# Patient Record
Sex: Female | Born: 1955 | Race: White | Hispanic: No | Marital: Single | Smoking: Never smoker
Health system: Southern US, Community
[De-identification: ages and names within clinical notes are randomized; demographics above are authoritative.]

## PROBLEM LIST (undated history)

## (undated) DIAGNOSIS — I4891 Unspecified atrial fibrillation: Secondary | ICD-10-CM

## (undated) DIAGNOSIS — T7840XA Allergy, unspecified, initial encounter: Secondary | ICD-10-CM

## (undated) DIAGNOSIS — G4730 Sleep apnea, unspecified: Secondary | ICD-10-CM

## (undated) DIAGNOSIS — G4733 Obstructive sleep apnea (adult) (pediatric): Secondary | ICD-10-CM

## (undated) DIAGNOSIS — Z800 Family history of malignant neoplasm of digestive organs: Secondary | ICD-10-CM

## (undated) DIAGNOSIS — C50919 Malignant neoplasm of unspecified site of unspecified female breast: Secondary | ICD-10-CM

## (undated) DIAGNOSIS — R011 Cardiac murmur, unspecified: Secondary | ICD-10-CM

## (undated) DIAGNOSIS — D649 Anemia, unspecified: Secondary | ICD-10-CM

## (undated) DIAGNOSIS — E785 Hyperlipidemia, unspecified: Secondary | ICD-10-CM

## (undated) DIAGNOSIS — IMO0002 Reserved for concepts with insufficient information to code with codable children: Secondary | ICD-10-CM

## (undated) DIAGNOSIS — I10 Essential (primary) hypertension: Secondary | ICD-10-CM

## (undated) DIAGNOSIS — Z803 Family history of malignant neoplasm of breast: Secondary | ICD-10-CM

## (undated) DIAGNOSIS — F32A Depression, unspecified: Secondary | ICD-10-CM

## (undated) DIAGNOSIS — E669 Obesity, unspecified: Secondary | ICD-10-CM

## (undated) DIAGNOSIS — E039 Hypothyroidism, unspecified: Secondary | ICD-10-CM

## (undated) HISTORY — PX: COLONOSCOPY: SHX174

## (undated) HISTORY — DX: Obstructive sleep apnea (adult) (pediatric): G47.33

## (undated) HISTORY — PX: VAGINAL HYSTERECTOMY: SUR661

## (undated) HISTORY — DX: Anemia, unspecified: D64.9

## (undated) HISTORY — DX: Sleep apnea, unspecified: G47.30

## (undated) HISTORY — DX: Depression, unspecified: F32.A

## (undated) HISTORY — DX: Allergy, unspecified, initial encounter: T78.40XA

## (undated) HISTORY — DX: Malignant neoplasm of unspecified site of unspecified female breast: C50.919

## (undated) HISTORY — DX: Family history of malignant neoplasm of digestive organs: Z80.0

## (undated) HISTORY — DX: Family history of malignant neoplasm of breast: Z80.3

## (undated) HISTORY — PX: APPENDECTOMY: SHX54

## (undated) HISTORY — DX: Reserved for concepts with insufficient information to code with codable children: IMO0002

## (undated) HISTORY — DX: Cardiac murmur, unspecified: R01.1

## (undated) HISTORY — DX: Essential (primary) hypertension: I10

## (undated) HISTORY — PX: HERNIA REPAIR: SHX51

## (undated) HISTORY — PX: CHOLECYSTECTOMY: SHX55

## (undated) HISTORY — PX: ESOPHAGOGASTRODUODENOSCOPY: SHX1529

---

## 1990-05-23 HISTORY — PX: ABDOMINAL HYSTERECTOMY: SHX81

## 2003-05-24 HISTORY — PX: NECK SURGERY: SHX720

## 2004-05-23 DIAGNOSIS — E039 Hypothyroidism, unspecified: Secondary | ICD-10-CM

## 2004-05-23 HISTORY — PX: THYROIDECTOMY: SHX17

## 2004-05-23 HISTORY — DX: Hypothyroidism, unspecified: E03.9

## 2008-05-23 DIAGNOSIS — M797 Fibromyalgia: Secondary | ICD-10-CM

## 2008-05-23 HISTORY — DX: Fibromyalgia: M79.7

## 2009-03-10 ENCOUNTER — Ambulatory Visit: Payer: Self-pay | Admitting: Occupational Medicine

## 2009-03-10 DIAGNOSIS — J40 Bronchitis, not specified as acute or chronic: Secondary | ICD-10-CM | POA: Insufficient documentation

## 2009-03-10 HISTORY — DX: Bronchitis, not specified as acute or chronic: J40

## 2009-05-23 HISTORY — PX: GASTRIC BYPASS: SHX52

## 2009-06-18 ENCOUNTER — Ambulatory Visit: Payer: Self-pay | Admitting: Family Medicine

## 2009-06-18 DIAGNOSIS — R112 Nausea with vomiting, unspecified: Secondary | ICD-10-CM | POA: Insufficient documentation

## 2009-06-18 DIAGNOSIS — E860 Dehydration: Secondary | ICD-10-CM

## 2009-06-18 HISTORY — DX: Dehydration: E86.0

## 2010-06-22 NOTE — Letter (Signed)
Summary: Physicians Certificate of Transfer  Physicians Certificate of Transfer   Imported By: Junius Finner 06/18/2009 15:17:49  _____________________________________________________________________  External Attachment:    Type:   Image     Comment:   External Document

## 2010-06-22 NOTE — Assessment & Plan Note (Signed)
Summary: NAUSEA & VOMITING/KH   Vital Signs:  Patient Profile:   55 Years Old Female CC:      Nausea and vomiting Height:     67 inches Weight:      305 pounds O2 Sat:      98 % O2 treatment:    Room Air Temp:     97.0 degrees F oral Pulse rate:   119 / minute Resp:     22 per minute BP sitting:   125 / 93  (right arm)  Pt. in pain?   no  Vitals Entered By: Lita Mains, RN                   Updated Prior Medication List: SAVELLA 12.5 MG TABS (MILNACIPRAN HCL)  LEVOXYL 150 MCG TABS (LEVOTHYROXINE SODIUM)  ESTROPIPATE 1.5 MG TABS (ESTROPIPATE)   Current Allergies: No known allergies History of Present Illness History from: patient Chief Complaint: Nausea and vomiting History of Present Illness: Patient c/o nausea, vomting diarrhea for one week that began as sudden onset associated with palpatations, some abdominal pressure and dizziness. Reports this morning after showering she passed out and fell onto bed, no known injury.  Pt recently had bypass surgery  at [facility] on Dec 29th. SHe has lost 30 lbs in the past month. Unable to keep down any food or liquid. SHe has been taking a medication for the nausea and does not remember the name, she is also taking oxycodone for stomach pains around the incision. Both medications were given to her post-op. Also complaing of hot and cold flashes.   REVIEW OF SYSTEMS Constitutional Symptoms       Complains of chills and fatigue.     Denies fever, night sweats, weight loss, and weight gain.  Eyes       Denies change in vision, eye pain, eye discharge, glasses, contact lenses, and eye surgery. Ear/Nose/Throat/Mouth       Denies hearing loss/aids, change in hearing, ear pain, ear discharge, dizziness, frequent runny nose, frequent nose bleeds, sinus problems, sore throat, hoarseness, and tooth pain or bleeding.  Respiratory       Denies dry cough, productive cough, wheezing, shortness of breath, asthma, bronchitis, and  emphysema/COPD.  Cardiovascular       Denies murmurs, chest pain, and tires easily with exhertion.    Gastrointestinal       Complains of stomach pain and nausea/vomiting.      Denies diarrhea, constipation, blood in bowel movements, and indigestion. Genitourniary       Denies painful urination, kidney stones, and loss of urinary control. Neurological       Denies paralysis and seizures. Musculoskeletal       Denies muscle pain, joint pain, joint stiffness, decreased range of motion, redness, swelling, muscle weakness, and gout.  Skin       Denies bruising, unusual mles/lumps or sores, and hair/skin or nail changes.  Psych       Denies mood changes, temper/anger issues, anxiety/stress, speech problems, depression, and sleep problems.  Past History:  Family History: Last updated: 03/10/2009 Mother: CAD Father: colon CA, DM  Social History: Last updated: 03/10/2009 Single Never Smoked Alcohol use-yes Drug use-no Regular exercise-no  Past Medical History: Reviewed history from 03/10/2009 and no changes required. Hypertension  Past Surgical History: Appendectomy Cholecystectomy Hysterectomy Lumpectomy Thyroidectomy Gastric bypass- december 2010  Family History: Reviewed history from 03/10/2009 and no changes required. Mother: CAD Father: colon CA, DM  Social History: Reviewed history  from 03/10/2009 and no changes required. Single Never Smoked Alcohol use-yes Drug use-no Regular exercise-no Physical Exam General appearance: pale Oral/Pharynx: mucus membranes dry. lips parched Chest/Lungs: no rales, wheezes, or rhonchi bilateral, breath sounds equal without effort Heart: regular rate and  rhythm, no murmur. tachycardia Abdomen: slight distention Extremities: normal extremities OVS POS - PATIENT BECAME FAINT ON STANDING. ECG NSR WITH NO ACUTE CHANGES. IV STARTED WITH NS Assessment New Problems: NAUSEA WITH VOMITING (ICD-787.01) DEHYDRATION  (ICD-276.51)  N/V/D, DEHYDRATION. POSSIBLE ELECTROLYTE IMBALANCE . RECENT GASTRIC BYPASS  Plan New Orders: New Patient Level III [99203] 0.9% NS [EMRZERO] Planning Comments:   [facility] CONTACTED. DR. Charlton Haws AGREES TO ACCEPT IN TRANSFER. PATIENT TRANSPORTED VIA EMS. STABE CONDITION AT TIME OF TRANSPORT.   The patient and/or caregiver has been counseled thoroughly with regard to medications prescribed including dosage, schedule, interactions, rationale for use, and possible side effects and they verbalize understanding.  Diagnoses and expected course of recovery discussed and will return if not improved as expected or if the condition worsens. Patient and/or caregiver verbalized understanding.   Patient Instructions: 1)  TRANSER TO [facility].    Medication Administration  Infusion # 1:    Diagnosis: NAUSEA WITH VOMITING (ICD-787.01)    Started: 1500    Solution: 0.9% NS    Instructions: IVP    Route: IV infusion    Site: R antecubital fossa    Ordered byLorenza Chick, K    Administered by: Lita Mains RN-June 18, 2009 3:19 PM  Orders Added: 1)  New Patient Level III [99203] 2)  0.9% NS [EMRZERO]

## 2014-05-01 DIAGNOSIS — E538 Deficiency of other specified B group vitamins: Secondary | ICD-10-CM | POA: Insufficient documentation

## 2014-05-23 DIAGNOSIS — I4891 Unspecified atrial fibrillation: Secondary | ICD-10-CM

## 2014-05-23 HISTORY — DX: Unspecified atrial fibrillation: I48.91

## 2015-04-23 DIAGNOSIS — I4891 Unspecified atrial fibrillation: Secondary | ICD-10-CM

## 2015-04-23 HISTORY — DX: Unspecified atrial fibrillation: I48.91

## 2015-04-28 ENCOUNTER — Observation Stay (HOSPITAL_COMMUNITY)
Admission: EM | Admit: 2015-04-28 | Discharge: 2015-04-29 | Disposition: A | Discharge status: Home / Self Care | Payer: Managed Care, Other (non HMO) | Attending: Cardiovascular Disease | Admitting: Cardiovascular Disease

## 2015-04-28 ENCOUNTER — Emergency Department (HOSPITAL_COMMUNITY): Payer: Managed Care, Other (non HMO)

## 2015-04-28 ENCOUNTER — Encounter (HOSPITAL_COMMUNITY): Payer: Self-pay | Admitting: *Deleted

## 2015-04-28 DIAGNOSIS — E039 Hypothyroidism, unspecified: Secondary | ICD-10-CM | POA: Diagnosis present

## 2015-04-28 DIAGNOSIS — R079 Chest pain, unspecified: Secondary | ICD-10-CM

## 2015-04-28 DIAGNOSIS — R002 Palpitations: Secondary | ICD-10-CM | POA: Diagnosis present

## 2015-04-28 DIAGNOSIS — I4891 Unspecified atrial fibrillation: Secondary | ICD-10-CM | POA: Diagnosis not present

## 2015-04-28 DIAGNOSIS — R5383 Other fatigue: Secondary | ICD-10-CM | POA: Diagnosis not present

## 2015-04-28 DIAGNOSIS — I499 Cardiac arrhythmia, unspecified: Secondary | ICD-10-CM | POA: Insufficient documentation

## 2015-04-28 HISTORY — DX: Hyperlipidemia, unspecified: E78.5

## 2015-04-28 HISTORY — DX: Obesity, unspecified: E66.9

## 2015-04-28 HISTORY — DX: Hypothyroidism, unspecified: E03.9

## 2015-04-28 HISTORY — DX: Chest pain, unspecified: R07.9

## 2015-04-28 HISTORY — DX: Unspecified atrial fibrillation: I48.91

## 2015-04-28 LAB — CBC WITH DIFFERENTIAL/PLATELET
BASOS ABS: 0 10*3/uL (ref 0.0–0.1)
Basophils Relative: 0 %
Eosinophils Absolute: 0.4 10*3/uL (ref 0.0–0.7)
Eosinophils Relative: 5 %
HEMATOCRIT: 44.5 % (ref 36.0–46.0)
HEMOGLOBIN: 14 g/dL (ref 12.0–15.0)
LYMPHS PCT: 29 %
Lymphs Abs: 2.1 10*3/uL (ref 0.7–4.0)
MCH: 30.5 pg (ref 26.0–34.0)
MCHC: 31.5 g/dL (ref 30.0–36.0)
MCV: 96.9 fL (ref 78.0–100.0)
Monocytes Absolute: 0.3 10*3/uL (ref 0.1–1.0)
Monocytes Relative: 5 %
NEUTROS ABS: 4.5 10*3/uL (ref 1.7–7.7)
NEUTROS PCT: 61 %
PLATELETS: 230 10*3/uL (ref 150–400)
RBC: 4.59 MIL/uL (ref 3.87–5.11)
RDW: 14.7 % (ref 11.5–15.5)
WBC: 7.3 10*3/uL (ref 4.0–10.5)

## 2015-04-28 LAB — COMPREHENSIVE METABOLIC PANEL
ALT: 18 U/L (ref 14–54)
AST: 24 U/L (ref 15–41)
Albumin: 3.5 g/dL (ref 3.5–5.0)
Alkaline Phosphatase: 88 U/L (ref 38–126)
Anion gap: 10 (ref 5–15)
BUN: 14 mg/dL (ref 6–20)
CHLORIDE: 107 mmol/L (ref 101–111)
CO2: 25 mmol/L (ref 22–32)
CREATININE: 0.91 mg/dL (ref 0.44–1.00)
Calcium: 9 mg/dL (ref 8.9–10.3)
GFR calc Af Amer: >60 mL/min (ref >60)
Glucose, Bld: 79 mg/dL (ref 65–99)
Potassium: 3.8 mmol/L (ref 3.5–5.1)
Sodium: 142 mmol/L (ref 135–145)
Total Bilirubin: 0.5 mg/dL (ref 0.3–1.2)
Total Protein: 6.2 g/dL — ABNORMAL LOW (ref 6.5–8.1)

## 2015-04-28 LAB — TROPONIN I: Troponin I: <0.03 ng/mL (ref <0.031)

## 2015-04-28 LAB — HEPARIN LEVEL (UNFRACTIONATED): HEPARIN UNFRACTIONATED: 0.56 [IU]/mL (ref 0.30–0.70)

## 2015-04-28 LAB — I-STAT TROPONIN, ED: TROPONIN I, POC: 0 ng/mL (ref 0.00–0.08)

## 2015-04-28 LAB — TSH: TSH: 2.192 u[IU]/mL (ref 0.350–4.500)

## 2015-04-28 MED ORDER — DILTIAZEM LOAD VIA INFUSION
10.0000 mg | Freq: Once | INTRAVENOUS | Status: AC
  Administered 2015-04-28: 10 mg via INTRAVENOUS
  Filled 2015-04-28: qty 10

## 2015-04-28 MED ORDER — DILTIAZEM HCL 100 MG IV SOLR
7.5000 mg/h | INTRAVENOUS | Status: DC
  Administered 2015-04-28: 7.5 mg/h via INTRAVENOUS
  Administered 2015-04-28: 5 mg/h via INTRAVENOUS
  Filled 2015-04-28: qty 100

## 2015-04-28 MED ORDER — ACETAMINOPHEN 325 MG PO TABS: 650.0000 mg | ORAL_TABLET | ORAL | Status: DC | PRN

## 2015-04-28 MED ORDER — LEVOTHYROXINE SODIUM 100 MCG PO TABS
100.0000 ug | ORAL_TABLET | Freq: Every day | ORAL | Status: DC
  Administered 2015-04-29: 100 ug via ORAL
  Filled 2015-04-28: qty 1

## 2015-04-28 MED ORDER — ONDANSETRON HCL 4 MG/2ML IJ SOLN: 4.0000 mg | Freq: Four times a day (QID) | INTRAMUSCULAR | Status: DC | PRN

## 2015-04-28 MED ORDER — BUPROPION HCL ER (XL) 150 MG PO TB24
450.0000 mg | ORAL_TABLET | Freq: Every day | ORAL | Status: DC
  Administered 2015-04-29: 450 mg via ORAL
  Filled 2015-04-28: qty 3

## 2015-04-28 MED ORDER — BUPROPION HCL ER (XL) 450 MG PO TB24: 450.0000 mg | ORAL_TABLET | Freq: Every day | ORAL | Status: DC

## 2015-04-28 MED ORDER — DILTIAZEM HCL 100 MG IV SOLR
INTRAVENOUS | Status: AC
  Administered 2015-04-28: 5 mg/h via INTRAVENOUS
  Filled 2015-04-28: qty 100

## 2015-04-28 MED ORDER — HEPARIN BOLUS VIA INFUSION
4500.0000 [IU] | Freq: Once | INTRAVENOUS | Status: AC
  Administered 2015-04-28: 4500 [IU] via INTRAVENOUS
  Filled 2015-04-28: qty 4500

## 2015-04-28 MED ORDER — HEPARIN (PORCINE) IN NACL 100-0.45 UNIT/ML-% IJ SOLN
1350.0000 [IU]/h | INTRAMUSCULAR | Status: DC
  Administered 2015-04-28: 1350 [IU]/h via INTRAVENOUS
  Filled 2015-04-28: qty 250

## 2015-04-28 MED ORDER — ALPRAZOLAM 0.25 MG PO TABS: 0.2500 mg | ORAL_TABLET | Freq: Two times a day (BID) | ORAL | Status: DC | PRN

## 2015-04-28 MED ORDER — ADULT MULTIVITAMIN W/MINERALS CH
1.0000 | ORAL_TABLET | Freq: Every day | ORAL | Status: DC
  Administered 2015-04-29: 1 via ORAL
  Filled 2015-04-28: qty 1

## 2015-04-28 MED ORDER — ZOLPIDEM TARTRATE 5 MG PO TABS
5.0000 mg | ORAL_TABLET | Freq: Every evening | ORAL | Status: DC | PRN
  Administered 2015-04-28: 5 mg via ORAL
  Filled 2015-04-28: qty 1

## 2015-04-28 NOTE — ED Notes (Signed)
Pt arrives via [facility] from work. Pt states she has been having a fluttering in her chest for a "few" days. Pt states she has also been having centralized chest pressure and fatigue. Pt denies pain at the present, sob, n/v.

## 2015-04-28 NOTE — ED Provider Notes (Addendum)
CSN: [id number]     Arrival date & time 04/28/15  1017 History   First MD Initiated Contact with Patient 04/28/15 1024     Chief Complaint  Patient presents with  . Palpitations     (Consider location/radiation/quality/duration/timing/severity/associated sxs/prior Treatment) Patient is a 59 y.o. female presenting with palpitations. The history is provided by the patient.  Palpitations Palpitations quality:  Fast Onset quality:  Sudden Duration:  3 days Timing:  Constant Progression:  Worsening Chronicity:  New Context: not anxiety, not appetite suppressants, not blood loss, not bronchodilators, not caffeine, not dehydration, not hyperventilation, not illicit drugs and not stimulant use   Relieved by:  Nothing Worsened by:  Nothing Ineffective treatments:  None tried Associated symptoms: chest pressure and malaise/fatigue   Associated symptoms: no back pain, no cough, no diaphoresis, no leg pain, no lower extremity edema, no numbness, no shortness of breath, no syncope, no vomiting and no weakness   Risk factors: hx of thyroid disease   Risk factors: no diabetes mellitus, no heart disease, no hx of atrial fibrillation, no hx of DVT and no hx of PE   Risk factors comment:  Patient's thyroid has been removed and she takes Synthroid. Also history of gastric bypass 5 years ago   History reviewed. No pertinent past medical history. History reviewed. No pertinent past surgical history. History reviewed. No pertinent family history. Social History  Substance Use Topics  . Smoking status: Never Smoker   . Smokeless tobacco: None  . Alcohol Use: No   OB History    No data available     Review of Systems  Constitutional: Positive for malaise/fatigue. Negative for diaphoresis.  Respiratory: Negative for cough and shortness of breath.   Cardiovascular: Positive for palpitations. Negative for syncope.  Gastrointestinal: Negative for vomiting.  Musculoskeletal: Negative for back pain.   Neurological: Negative for weakness and numbness.  All other systems reviewed and are negative.     Allergies  Review of patient's allergies indicates no known allergies.  Home Medications   Prior to Admission medications   Not on File   Ht 5\' 7"  (1.702 m)  Wt 225 lb (102.059 kg)  BMI 35.23 kg/m2  SpO2 99% Physical Exam  Constitutional: She is oriented to person, place, and time. She appears well-developed and well-nourished. No distress.  HENT:  Head: Normocephalic and atraumatic.  Mouth/Throat: Oropharynx is clear and moist.  Eyes: Conjunctivae and EOM are normal. Pupils are equal, round, and reactive to light.  Neck: Normal range of motion. Neck supple.  Cardiovascular: Intact distal pulses.  An irregularly irregular rhythm present. Tachycardia present.   No murmur heard. Pulmonary/Chest: Effort normal and breath sounds normal. No respiratory distress. She has no wheezes. She has no rales.  Abdominal: Soft. She exhibits no distension. There is no tenderness. There is no rebound and no guarding.  Musculoskeletal: Normal range of motion. She exhibits no edema or tenderness.  Neurological: She is alert and oriented to person, place, and time.  Skin: Skin is warm and dry. No rash noted. No erythema.  Psychiatric: She has a normal mood and affect. Her behavior is normal.  Nursing note and vitals reviewed.   ED Course  Procedures (including critical care time) Labs Review Labs Reviewed  COMPREHENSIVE METABOLIC PANEL - Abnormal; Notable for the following:    Total Protein 6.2 (*)    All other components within normal limits  CBC WITH DIFFERENTIAL/PLATELET  Randolm Idol, ED    Imaging Review Dg Chest Port 1  View  04/28/2015  CLINICAL DATA:  Palpitations for 3 days.  Some shortness of breath. EXAM: PORTABLE CHEST - 1 VIEW COMPARISON:  Two-view chest x-ray 03/10/2009 FINDINGS: The heart size is exaggerated by low lung volumes. Linear airspace opacity at the left base  likely reflects atelectasis. The right lung base is clear. No other focal consolidation is present. Moderate pulmonary vascular congestion is new. There are no definite effusions. The visualized soft tissues and bony thorax are unremarkable. IMPRESSION: 1. Borderline cardiomegaly and pulmonary vascular congestion suggesting early congestive heart failure. 2. Linear densities at the left lung base likely reflect atelectasis. Infection is considered less likely. Electronically Signed   By: San Morelle M.D.   On: 04/28/2015 10:52   I have personally reviewed and evaluated these images and lab results as part of my medical decision-making.  ED ECG REPORT   Date: 04/28/2015  Rate: 148  Rhythm: atrial fibrillation  With RVR  QRS Axis: normal  Intervals: QT prolonged  ST/T Wave abnormalities: nonspecific ST/T changes  Conduction Disutrbances:low voltage  Narrative Interpretation:   Old EKG Reviewed: none available  I have personally reviewed the EKG tracing and agree with the computerized printout as noted.   MDM   Final diagnoses:  Atrial fibrillation with RVR [facility])    Patient is a 59 year old female presenting here today with palpitations, dizziness and mild chest pressure and fatigue that's been ongoing for 3 days. Patient found to be in atrial fibrillation today with a heart rate in the 140 to 150s.  Patient's blood pressure is currently stable she is in no distress and speaking in complete sentences. No prior history of atrial fibrillation or heart disease. She has a significant past medical history for gastric bypass 5 years ago with a complicated course but that seems to have normalized and she does not have as many problems. She denies any excessive caffeine use, over-the-counter medication use and no recent medication changes. She had otherwise felt her normal self until 3 days ago when symptoms started. In the past she has had occasional bouts of palpitations which resolved  spontaneously. Nothing that has lasted this long.  Chadvasc score of 1  CBC, CMP, troponin, chest x-ray pending. Patient started on Cardizem for rate control.  11:20 AM Labs are within normal limits. X-ray without acute findings except for mild vascular congestion. After Cardizem heart rate is more controlled in the lower 100s and upper 90s. Will admit for further care.  CRITICAL CARE Performed by: Blanchie Dessert Total critical care time: 30 minutes Critical care time was exclusive of separately billable procedures and treating other patients. Critical care was necessary to treat or prevent imminent or life-threatening deterioration. Critical care was time spent personally by me on the following activities: development of treatment plan with patient and/or surrogate as well as nursing, discussions with consultants, evaluation of patient's response to treatment, examination of patient, obtaining history from patient or surrogate, ordering and performing treatments and interventions, ordering and review of laboratory studies, ordering and review of radiographic studies, pulse oximetry and re-evaluation of patient's condition.    Blanchie Dessert, MD 04/28/15 [place], MD 04/28/15 1221

## 2015-04-28 NOTE — Progress Notes (Signed)
ANTICOAGULATION CONSULT NOTE - Initial Consult  Pharmacy Consult for heparin Indication: chest pain/ACS vs atrial fibrillation  No Known Allergies  Patient Measurements: Height: 5\' 7"  (170.2 cm) Weight: 241 lb 10 oz (109.6 kg) IBW/kg (Calculated) : 61.6 Heparin Dosing Weight: 86.8 kg  Vital Signs: Temp: 98.3 F (36.8 C) (12/06 1712) Temp Source: Oral (12/06 1712) BP: 108/81 mmHg (12/06 1630) Pulse Rate: 75 (12/06 1445)  Labs:  Recent Labs  04/28/15 1034  HGB 14.0  HCT 44.5  PLT 230  CREATININE 0.91    Estimated Creatinine Clearance: 86 mL/min (by C-G formula based on Cr of 0.91).   Medical History: Past Medical History  Diagnosis Date  . Hypothyroidism (acquired) 2006  . Hyperlipidemia   . Obesity     gastric bpg 2011, weight then 400 lbs, lost 100 lbs in 60 days.  . Atrial fibrillation with rapid ventricular response ([place]) 04/2015    Assessment: 59 year old female admitted 04/28/15 with fluttering in her chest, chest pain, dizziness, and DOE found to be in new onset atrial fibrillation with RVR (CHADS2-VASc 1). Plan to possibly change to DOAC if patient does not spontaneously convert. CBC wnl.   Goal of Therapy:  Heparin level 0.3-0.7 units/ml Monitor platelets by anticoagulation protocol: Yes   Plan:  Heparin bolus of 4500 units x1, then 1350 units/hr.  Heparin level in 6 hours.  Daily heparin level and CBC while on therapy.   Sloan Leiter, PharmD, BCPS Clinical Pharmacist [phone] 04/28/2015,5:19 PM

## 2015-04-28 NOTE — ED Notes (Signed)
Heart rate is now fluctuating from the upper 80's to up to 100.

## 2015-04-28 NOTE — Progress Notes (Signed)
Patient on Cardizem drip and blood pressure 87/55. Cardiology Fellow Philbert Riser on call notified. Per MD ordered to decrease drip to mg/hr. Patient has no complaint of dizziness or lightheadedness. Will continue to monitor.

## 2015-04-28 NOTE — H&P (Signed)
History and Physical   Patient ID: Monique Kelly MRN: [id number], DOB/AGE: [birth year] 59 y.o. Date of Encounter: 04/28/2015  Primary Physician: Dr Monique Kelly, Tia Alert, [place] Primary Cardiologist: New  Chief Complaint:  Atrial fib  HPI: Monique Kelly is a 59 y.o. female with a history of gastric bypass 2011, hypothyroid, obesity, but no known cardiac issues. Stress test and echo before gastric bypass were OK. FH CHF, not CAD. CT scan performed 2015 done for GI issues showed coronary calcifications.   For the last 3-4 days, pt has felt fluttering in her chest, had chest pressure and felt tired. Until today, no other symptoms. Today, fluttering was worse, pt noticed dizziness and had DOE. Chest pressure was worse, reaching a 5-6/10. No change in pain with deep inspiration. She was at work and the NP on site checked her and recommended ER evaluation for irregular HR.  In the ER, the chest pressure improved with IV Cardizem, which slowed her heart rate. She briefly converted to SR, but went back into fib.   Pt has had these symptoms before. Felt it after her dad's funeral, but it was very brief. Would only have symptoms every other year or so. She also has a history of fainting spells as a child, she would eat afterwards and feel better, no diagnosis ever made.   Past Medical History  Diagnosis Date  . Hypothyroidism (acquired) 2006  . Hyperlipidemia   . Obesity     gastric bpg 2011, weight then 400 lbs, lost 100 lbs in 60 days.  . Atrial fibrillation with rapid ventricular response ([place]) 04/2015    Surgical History:  Past Surgical History  Procedure Laterality Date  . Thyroidectomy  2006    for nodules  . Cholecystectomy    . Vaginal hysterectomy    . Appendectomy    . Gastric bypass  2011  . Esophagogastroduodenoscopy    . Colonoscopy       I have reviewed the patient's current medications. Prior to Admission medications   Medication Sig Start Date End Date Taking?  Authorizing Provider  FORFIVO XL 450 MG TB24 Take 450 mg by mouth daily. 02/25/15  Yes Historical Provider, MD  levothyroxine (SYNTHROID, LEVOTHROID) 100 MCG tablet Take 100 mcg by mouth daily. 02/23/15  Yes Historical Provider, MD  Multiple Vitamin (MULTIVITAMIN WITH MINERALS) TABS tablet Take 1 tablet by mouth daily.   Yes Historical Provider, MD  Naproxen Sod-Diphenhydramine (ALEVE PM PO) Take 2 tablets by mouth at bedtime.   Yes Historical Provider, MD   Scheduled Meds:  Continuous Infusions: . diltiazem (CARDIZEM) infusion 7.5 mg/hr (04/28/15 1409)   PRN Meds:.  Allergies: No Known Allergies  Social History   Social History  . Marital Status: Married    Spouse Name: N/A  . Number of Children: N/A  . 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 Smoking status: Never Smoker   . Smokeless tobacco: Not on file  . Alcohol Use: No     Comment: rare glass of wine  . Drug Use: No  . Sexual Activity: Not on file   Other Topics Concern  . Not on file   Social History Narrative   Pt lives in [place] alone.     Family History  Problem Relation Age of Onset  . Heart failure Father   . Heart failure Maternal Grandmother   . Heart failure Maternal Grandfather  Family Status  Relation Status Death Age  . Father Deceased 16  . Mother Alive     born 2    Review of Systems:   Full 14-point review of systems otherwise negative except as noted above.  Physical Exam: Blood pressure 129/82, pulse 110, resp. rate 14, height 5\' 7"  (1.702 m), weight 225 lb (102.059 kg), SpO2 96 %. General: Well developed, well nourished,female in no acute distress. Head: Normocephalic, atraumatic, sclera non-icteric, no xanthomas, nares are without discharge. Dentition: good Neck: No carotid bruits. JVD not elevated. No thyromegally Lungs: Good expansion bilaterally. without wheezes or rhonchi.  Heart: IRRegular rate and rhythm  with S1 S2.  No S3 or S4.  No murmur, no rubs, or gallops appreciated. Abdomen: Soft, non-tender, non-distended with normoactive bowel sounds. No hepatomegaly. No rebound/guarding. No obvious abdominal masses. Msk:  Strength and tone appear normal for age. No joint deformities or effusions, no spine or costo-vertebral angle tenderness. Extremities: No clubbing or cyanosis. No edema.  Distal pedal pulses are 2+ in 4 extrem Neuro: Alert and oriented X 3. Moves all extremities spontaneously. No focal deficits noted. Psych:  Responds to questions appropriately with a normal affect. Skin: No rashes or lesions noted  Labs:   Lab Results  Component Value Date   WBC 7.3 04/28/2015   HGB 14.0 04/28/2015   HCT 44.5 04/28/2015   MCV 96.9 04/28/2015   PLT 230 04/28/2015     Recent Labs Lab 04/28/15 1034  NA 142  K 3.8  CL 107  CO2 25  BUN 14  CREATININE 0.91  CALCIUM 9.0  PROT 6.2*  BILITOT 0.5  ALKPHOS 88  ALT 18  AST 24  GLUCOSE 79    Recent Labs  04/28/15 1043  TROPIPOC 0.00   Radiology/Studies: Dg Chest Port 1 View 04/28/2015  CLINICAL DATA:  Palpitations for 3 days.  Some shortness of breath. EXAM: PORTABLE CHEST - 1 VIEW COMPARISON:  Two-view chest x-ray 03/10/2009 FINDINGS: The heart size is exaggerated by low lung volumes. Linear airspace opacity at the left base likely reflects atelectasis. The right lung base is clear. No other focal consolidation is present. Moderate pulmonary vascular congestion is new. There are no definite effusions. The visualized soft tissues and bony thorax are unremarkable. IMPRESSION: 1. Borderline cardiomegaly and pulmonary vascular congestion suggesting early congestive heart failure. 2. Linear densities at the left lung base likely reflect atelectasis. Infection is considered less likely. Electronically Signed   By: San Morelle M.D.   On: 04/28/2015 10:52    ECG: 04/27/2015 Atrial fib, RVR, hr 148  ASSESSMENT AND PLAN:  Principal  Problem:   Atrial fibrillation with rapid ventricular response (HCC) - Pt slowed and briefly converted with IV Cardizem.  - duration unknown, probably several days - continue Cardizem, change to PO in am - This patients CHA2DS2-VASc Score and unadjusted Ischemic Stroke Rate (% per year) is equal to 0.6 % stroke rate/year from a score of 1 Above score calculated as 1 point each if present [CHF, HTN, DM, Vascular=MI/PAD/Aortic Plaque, Age if 65-74, or Female], 2 points each if present [Age > 75, or Stroke/TIA/TE] - start heparin if admitted, MD advise on oral anticoagulation - MD advise on event monitor, if can reliably detect symptoms, consider Flecainide pill-in-pocket approach.  Active Problems:   Hypothyroidism (acquired) - continue home Synthroid, ck TSH    Chest pain with moderate risk for cardiac etiology - ck echo - cycle enzymes - MD advise on stress testing, could do  as outpt.  Plan: possible d/c in am with outpatient testing, ?on Cardizem and ASA  Signed, Rosaria Ferries, PA-C 04/28/2015 2:18 PM Beeper [id number]  Patient seen, examined. Available data reviewed. Agree with findings, assessment, and plan as outlined by Rosaria Ferries, PA-C. Exam reveals an alert, oriented woman in NAD. JVP normal, lungs CTA, heart irregularly irregular without murmur, ext with trace edema. EKG shows AF with RVR. Pt with new onset atrial fibrillation with RVR, likely 3 days duration, with associated tachypalpitations, dyspnea, and chest discomfort. Now with better controlled heart rate on IV cardizem. Plan reviewed with patient:  Continue IV Cardizem  Start IV heparin - anticipate change to DOAC if she doesn't convert spontaneously  2D Echo  TSH  If spontaneous conversion to sinus rhythm overnight, ASA alone considering CHADS-Vasc = 1.   Sherren Mocha, M.D. 04/28/2015 4:17 PM

## 2015-04-28 NOTE — ED Notes (Signed)
Pt made aware of bed assignement 

## 2015-04-29 ENCOUNTER — Other Ambulatory Visit (HOSPITAL_COMMUNITY): Payer: Managed Care, Other (non HMO)

## 2015-04-29 ENCOUNTER — Ambulatory Visit (HOSPITAL_BASED_OUTPATIENT_CLINIC_OR_DEPARTMENT_OTHER): Payer: Managed Care, Other (non HMO)

## 2015-04-29 DIAGNOSIS — I4891 Unspecified atrial fibrillation: Secondary | ICD-10-CM

## 2015-04-29 LAB — CBC
HEMATOCRIT: 39.8 % (ref 36.0–46.0)
Hemoglobin: 13.1 g/dL (ref 12.0–15.0)
MCH: 31.5 pg (ref 26.0–34.0)
MCHC: 32.9 g/dL (ref 30.0–36.0)
MCV: 95.7 fL (ref 78.0–100.0)
Platelets: 238 10*3/uL (ref 150–400)
RBC: 4.16 MIL/uL (ref 3.87–5.11)
RDW: 15.1 % (ref 11.5–15.5)
WBC: 6.5 10*3/uL (ref 4.0–10.5)

## 2015-04-29 LAB — TROPONIN I

## 2015-04-29 LAB — HEPARIN LEVEL (UNFRACTIONATED): Heparin Unfractionated: 0.53 IU/mL (ref 0.30–0.70)

## 2015-04-29 MED ORDER — APIXABAN 5 MG PO TABS: 5.0000 mg | ORAL_TABLET | Freq: Two times a day (BID) | ORAL | Status: DC

## 2015-04-29 MED ORDER — FLECAINIDE ACETATE 50 MG PO TABS: 50.0000 mg | ORAL_TABLET | Freq: Two times a day (BID) | ORAL | Status: DC

## 2015-04-29 MED ORDER — OFF THE BEAT BOOK
Freq: Once | Status: AC
  Administered 2015-04-29: 1
  Filled 2015-04-29: qty 1

## 2015-04-29 MED ORDER — APIXABAN 5 MG PO TABS
5.0000 mg | ORAL_TABLET | Freq: Two times a day (BID) | ORAL | Status: DC
  Administered 2015-04-29: 5 mg via ORAL
  Filled 2015-04-29: qty 1

## 2015-04-29 MED ORDER — DILTIAZEM HCL ER COATED BEADS 120 MG PO CP24: 120.0000 mg | ORAL_CAPSULE | Freq: Every day | ORAL | Status: DC

## 2015-04-29 MED ORDER — DILTIAZEM HCL ER COATED BEADS 120 MG PO CP24
120.0000 mg | ORAL_CAPSULE | Freq: Every day | ORAL | Status: DC
  Administered 2015-04-29: 120 mg via ORAL
  Filled 2015-04-29: qty 1

## 2015-04-29 MED ORDER — FLECAINIDE ACETATE 50 MG PO TABS
50.0000 mg | ORAL_TABLET | Freq: Two times a day (BID) | ORAL | Status: DC
  Administered 2015-04-29: 50 mg via ORAL
  Filled 2015-04-29 (×2): qty 1

## 2015-04-29 NOTE — Progress Notes (Signed)
ANTICOAGULATION CONSULT NOTE - Follow Up Consult  Pharmacy Consult for Heparin >> apixaban Indication: atrial fibrillation  No Known Allergies  Patient Measurements: Height: 5\' 7"  (170.2 cm) Weight: 241 lb 14.4 oz (109.725 kg) IBW/kg (Calculated) : 61.6 Vital Signs: Temp: 98.1 F (36.7 C) (12/07 0748) Temp Source: Oral (12/07 0748) BP: 106/59 mmHg (12/07 0417) Pulse Rate: 66 (12/07 0417)  Labs:  Recent Labs  04/28/15 1034 04/28/15 1826 04/28/15 2315 04/29/15 0500  HGB 14.0  --   --  13.1  HCT 44.5  --   --  39.8  PLT 230  --   --  238  HEPARINUNFRC  --   --  0.56 0.53  CREATININE 0.91  --   --   --   TROPONINI  --  <0.03 <0.03 <0.03    Estimated Creatinine Clearance: 86 mL/min (by C-G formula based on Cr of 0.91).   Assessment: 46 yof with new-onset afib, to transition from heparin IV to apixaban. CHADS2VASC 1. Communicated with RN to turn off heparin at time of 1st apixaban dose. Dose appropriate. CBC wnl, SCr 0.91, no bleed documented.  Goal of Therapy:  Stroke prevention Monitor platelets by anticoagulation protocol: Yes   Plan:  Stop hep at time of 1st dose of apixaban (communicated with RN) Eliquis 5mg  PO bid Mon CBC, s/sx bleeding  Elicia Lamp, PharmD, [facility] Clinical Pharmacist Pager [phone] 04/29/2015 8:58 AM

## 2015-04-29 NOTE — Discharge Summary (Signed)
Physician Discharge Summary   Cardiologist:  Cooper-New  Patient ID: Monique Kelly MRN: [id number] DOB/AGE: [birth year] 59 y.o.  Admit date: 04/28/2015 Discharge date: 04/29/2015  Admission Diagnoses: Afib RVR  Discharge Diagnoses:  Principal Problem:   Atrial fibrillation with rapid ventricular response ([place]) Active Problems:   Hypothyroidism (acquired)   Chest pain with moderate risk for cardiac etiology   Discharged Condition: stable  Hospital Course:   Monique Kelly is a 59 y.o. female with a history of gastric bypass 2011, hypothyroid, obesity, but no known cardiac issues. Stress test and echo before gastric bypass were OK. FH CHF, not CAD. CT scan performed 2015 done for GI issues showed coronary calcifications.   For the last 3-4 days, pt has felt fluttering in her chest, had chest pressure and felt tired. Until admission, no other symptoms. Today, fluttering was worse, pt noticed dizziness and had DOE. Chest pressure was worse, reaching a 5-6/10. No change in pain with deep inspiration. She was at work and the NP on site checked her and recommended ER evaluation for irregular HR.  In the ER, the chest pressure improved with IV Cardizem, which slowed her heart rate. She briefly converted to SR, but went back into fib.   She has had these symptoms before. Felt it after her dad's funeral, but it was very brief. Would only have symptoms every other year or so. She also has a history of fainting spells as a child, she would eat afterwards and feel better, no diagnosis ever made.   Patient was admitted and started on IV heparin. IV diltiazem was continued. She ruled out for MI.  TSH was within normal limits. Chest x-ray revealed borderline cardiomegaly pulmonary vascular congestion.  She did become hypotensive overnight. Cardizem was decreased and later changed to 120 mg daily  She was started on flecainide 50 mg twice daily. CHA2DS2-VASc Score = 1.   IV heparin was discontinued and  Eliquis 5 mg twice daily was started.  She had a 2-D echocardiogram which revealed an EF of 55-60% with normal wall motion, G1DD, Mild MR and mildly dilated LA.   Follow-up in atrial fibrillation clinic arranged.  The patient was seen by Dr. Burt Knack who felt she was stable for DC home.    She will need an outpatient exercise Myoview  Consults: None  Significant Diagnostic Studies: PORTABLE CHEST - 1 VIEW  COMPARISON: Two-view chest x-ray 03/10/2009  FINDINGS: The heart size is exaggerated by low lung volumes. Linear airspace opacity at the left base likely reflects atelectasis. The right lung base is clear. No other focal consolidation is present. Moderate pulmonary vascular congestion is new. There are no definite effusions. The visualized soft tissues and bony thorax are unremarkable.  IMPRESSION: 1. Borderline cardiomegaly and pulmonary vascular congestion suggesting early congestive heart failure. 2. Linear densities at the left lung base likely reflect atelectasis. Infection is considered less likely.  Echocardiogram Study Conclusions  - Left ventricle: The cavity size was normal. Wall thickness was normal. Systolic function was normal. The estimated ejection fraction was in the range of 55% to 60%. Wall motion was normal; there were no regional wall motion abnormalities. Doppler parameters are consistent with abnormal left ventricular relaxation (grade 1 diastolic dysfunction). - Mitral valve: There was mild regurgitation. - Left atrium: The atrium was mildly dilated.  Treatments: See above  Discharge Exam: Blood pressure 105/68, pulse 66, temperature 98.1 F (36.7 C), temperature source Oral, resp. rate 17, height 5\' 7"  (1.702 m), weight 241  lb 14.4 oz (109.725 kg), SpO2 93 %.   Disposition: Final discharge disposition not confirmed      Discharge Instructions    Diet - low sodium heart healthy    Complete by:  As directed      Increase  activity slowly    Complete by:  As directed             Medication List    TAKE these medications        ALEVE PM PO  Take 2 tablets by mouth at bedtime.     apixaban 5 MG Tabs tablet  Commonly known as:  ELIQUIS  Take 1 tablet (5 mg total) by mouth 2 (two) times daily.     diltiazem 120 MG 24 hr capsule  Commonly known as:  CARDIZEM CD  Take 1 capsule (120 mg total) by mouth daily.     flecainide 50 MG tablet  Commonly known as:  TAMBOCOR  Take 1 tablet (50 mg total) by mouth every 12 (twelve) hours.     FORFIVO XL 450 MG Tb24  Generic drug:  BuPROPion HCl ER (XL)  Take 450 mg by mouth daily.     levothyroxine 100 MCG tablet  Commonly known as:  SYNTHROID, LEVOTHROID  Take 100 mcg by mouth daily.     multivitamin with minerals Tabs tablet  Take 1 tablet by mouth daily.       Follow-up Information    Follow up with CARROLL,DONNA, NP On 05/06/2015.   Specialties:  Nurse Practitioner, Cardiology   Why:  2:00 PM   Contact information:   [place] [address] [phone]      Greater than 30 minutes was spent completing the patient's discharge.    SignedTarri Fuller, [place]  04/29/2015, 4:54 PM

## 2015-04-29 NOTE — Care Management Note (Addendum)
Case Management Note  Patient Details  Name: Monique Kelly MRN: [id number] Date of Birth: 10-Sep-1955  Subjective/Objective:    Pt admitted for A fib. Plan for d/c home on Eliquis.                 Action/Plan: Benefits check completed: Eliquis: Estimated co pay at retail 30 day supply: $35. No auth required. Patient can use any retail pharmacy. Pt will need a Rx for 30 day free card no refills and the original Rx with refills. CM will provide 30 day free card and the co pay card. No further needs from CM at this time.    Expected Discharge Date:                  Expected Discharge Plan:  Home/Self Care  In-House Referral:  NA  Discharge planning Services  CM Consult, Medication Assistance  Post Acute Care Choice:  NA Choice offered to:  NA  DME Arranged:  N/A DME Agency:  NA  HH Arranged:  NA HH Agency:  NA  Status of Service:  Completed, signed off  Medicare Important Message Given:    Date Medicare IM Given:    Medicare IM give by:    Date Additional Medicare IM Given:    Additional Medicare Important Message give by:     If discussed at [place] of Stay Meetings, dates discussed:    Additional Comments: 1454 04-29-15 Jacqlyn Krauss, RN,BSN [phone] CM did call [facility] in [place] and medication is available. No further needs from CM at this time.    Bethena Roys, RN 04/29/2015, 2:18 PM

## 2015-04-29 NOTE — Discharge Instructions (Addendum)

## 2015-04-29 NOTE — Progress Notes (Signed)
Patient being discharged home per MD order. All discharge instructions and education book and packets given to patient.Patient will go home with mother.

## 2015-04-29 NOTE — Progress Notes (Signed)
Patient was given [employer], there has been no complaints of pain from the patient. Patient spoke with CSM about being sent home on Eliquis.

## 2015-04-29 NOTE — Progress Notes (Signed)
ANTICOAGULATION CONSULT NOTE - Follow Up Consult  Pharmacy Consult for Heparin  Indication: atrial fibrillation  No Known Allergies  Patient Measurements: Height: 5\' 7"  (170.2 cm) Weight: 241 lb 10 oz (109.6 kg) IBW/kg (Calculated) : 61.6 Vital Signs: Temp: 98 F (36.7 C) (12/06 2348) Temp Source: Oral (12/06 2348) BP: 101/54 mmHg (12/06 2348) Pulse Rate: 102 (12/06 2348)  Labs:  Recent Labs  04/28/15 1034 04/28/15 1826 04/28/15 2315  HGB 14.0  --   --   HCT 44.5  --   --   PLT 230  --   --   HEPARINUNFRC  --   --  0.56  CREATININE 0.91  --   --   TROPONINI  --  <0.03 <0.03    Estimated Creatinine Clearance: 86 mL/min (by C-G formula based on Cr of 0.91).   Assessment: New onset afib, initial heparin level is therapeutic  Goal of Therapy:  Heparin level 0.3-0.7 units/ml Monitor platelets by anticoagulation protocol: Yes   Plan:  -Continue heparin at 1350 units/hr -Confirmatory HL with AM labs  Narda Bonds 04/29/2015,12:28 AM

## 2015-04-29 NOTE — Progress Notes (Addendum)
    Subjective:  No chest pain, shortness of breath, or heart palpitations at rest  Objective:  Vital Signs in the last 24 hours: Temp:  [97.9 F (36.6 C)-98.3 F (36.8 C)] 97.9 F (36.6 C) (12/07 0400) Pulse Rate:  [45-114] 66 (12/07 0417) Resp:  [13-23] 17 (12/07 0417) BP: (86-129)/(53-92) 106/59 mmHg (12/07 0417) SpO2:  [87 %-99 %] 93 % (12/07 0417) Weight:  [225 lb (102.059 kg)-241 lb 14.4 oz (109.725 kg)] 241 lb 14.4 oz (109.725 kg) (12/07 0400)  Intake/Output from previous day: 12/06 0701 - 12/07 0700 In: 592.6 [P.O.:460; I.V.:132.6] Out: 725 [Urine:725]  Physical Exam: Pt is alert and oriented, pleasant, obese woman in NAD HEENT: normal Neck: JVP - normal Lungs: CTA bilaterally CV: Irregularly irregular without murmur or gallop Abd: soft, NT Ext: no C/C/E, distal pulses intact and equal Skin: warm/dry no rash   Lab Results:  Recent Labs  04/28/15 1034 04/29/15 0500  WBC 7.3 6.5  HGB 14.0 13.1  PLT 230 238    Recent Labs  04/28/15 1034  NA 142  K 3.8  CL 107  CO2 25  GLUCOSE 79  BUN 14  CREATININE 0.91    Recent Labs  04/28/15 2315 04/29/15 0500  TROPONINI <0.03 <0.03    Cardiac Studies: 2D Echo Pending  Tele: Personally reviewed: There are short periods of sinus rhythm lasting approximately 10-60 seconds, but patient is predominantly in atrial fibrillation with heart rate 90-120 bpm.  Assessment/Plan:  Paroxysmal atrial fibrillation:  This patients CHA2DS2-VASc Score and unadjusted Ischemic Stroke Rate (% per year) is equal to 0.6 % stroke rate/year from a score of 1 for female gender  Above score calculated as 1 point each if present [CHF, HTN, DM, Vascular=MI/PAD/Aortic Plaque, Age if 31-74, or Female] Above score calculated as 2 points each if present [Age > 75, or Stroke/TIA/TE]  Plan:  Await 2D Echo  Start Flecainide 50 mg BID  Stop Heparin and Start Eliquis 5 mg BID  Arrange close FU in the Atrial Fib Clinic  Consider  outpatient cardioversion in 3 weeks if persistent AFib  Would not anticipate long-term Eliquis, but reasonable for short-term use in case cardioversion required  Arrange outpatient exercise stress Myoview 2-3 weeks as patient had chest discomfort with rapid AF. Troponin negative  Anticipate discharge later today  Sherren Mocha, M.D. 04/29/2015, 7:43 AM

## 2015-04-29 NOTE — Progress Notes (Signed)
  Echocardiogram 2D Echocardiogram has been performed.  Monique Kelly 04/29/2015, 3:06 PM

## 2015-05-06 ENCOUNTER — Ambulatory Visit (HOSPITAL_COMMUNITY)
Admit: 2015-05-06 | Discharge: 2015-05-06 | Disposition: A | Discharge status: Home / Self Care | Payer: Managed Care, Other (non HMO) | Source: Ambulatory Visit | Attending: Nurse Practitioner | Admitting: Nurse Practitioner

## 2015-05-06 VITALS — BP 132/86 | HR 101 | Ht 67.0 in | Wt 245.0 lb

## 2015-05-06 DIAGNOSIS — I480 Paroxysmal atrial fibrillation: Secondary | ICD-10-CM | POA: Insufficient documentation

## 2015-05-06 DIAGNOSIS — R079 Chest pain, unspecified: Secondary | ICD-10-CM | POA: Insufficient documentation

## 2015-05-06 DIAGNOSIS — I4891 Unspecified atrial fibrillation: Secondary | ICD-10-CM

## 2015-05-07 ENCOUNTER — Encounter (HOSPITAL_COMMUNITY): Payer: Self-pay | Admitting: Nurse Practitioner

## 2015-05-07 NOTE — Progress Notes (Signed)
Patient ID: Monique Kelly, female   DOB: February 07, 1956, 59 y.o.   MRN: [id number]     Primary Care Physician: No primary care provider on file. Referring Physician: Dr.    Nyra Kelly is a 59 y.o. female with a h/o gastric bypass 2011, hypothyroid, obesity, but no known cardiac issues. Stress test and echo before gastric bypass were OK. FH CHF, not CAD. CT scan performed 2015 done for GI issues showed coronary calcifications.  For the last 3-4 days, pt has felt fluttering in her chest, had chest pressure and felt tired. Until admission, no other symptoms. Today, fluttering was worse, pt noticed dizziness and had DOE. Chest pressure was worse, reaching a 5-6/10. No change in pain with deep inspiration. She was at work and the NP on site checked her and recommended ER evaluation for irregular HR. In the ER, the chest pressure improved with IV Cardizem, which slowed her heart rate. She briefly converted to SR, but went back into fib.  She has had these symptoms before. Felt it after her dad's funeral, but it was very brief. Would only have symptoms every other year or so. She also has a history of fainting spells as a child, she would eat afterwards and feel better, no diagnosis ever made.  Patient was admitted and started on IV heparin. IV diltiazem was continued. She ruled out for MI. TSH was within normal limits. Chest x-ray revealed borderline cardiomegaly pulmonary vascular congestion. She did become hypotensive overnight. Cardizem was decreased and later changed to 120 mg daily She was started on flecainide 50 mg twice daily. CHA2DS2-VASc Score = 1. IV heparin was discontinued and Eliquis 5 mg twice daily was started. She had a 2-D echocardiogram which revealed an EF of 55-60% with normal wall motion, G1DD, Mild MR and mildly dilated LA.  Follow-up in atrial fibrillation clinic arranged. The patient was seen by Dr. Burt Kelly who felt she was stable for DC  home.  She is in the afib clinic for f/u. She is in sinus rhythm. She is tolerating the flecainide, Intervals are wnl limits on flecainide. She will need to be scheduled for outpt stress test for some complaints with chest pain, that seem to be associated with afib with rvr. She is going to schedule another sleep study with her PCP due to snoring. She had one years ago and used cpap but after bypass surgery, her snoring improved with weight loss and she has had  used  for years. She has had some breakthrough afib right after hospitalization but rhythm has been better lately. She is taking apixaban for chads vasc of 1.  Today, she denies symptoms of palpitations, chest pain, shortness of breath, orthopnea, PND, lower extremity edema, dizziness, presyncope, syncope, or neurologic sequela. The patient is tolerating medications without difficulties and is otherwise without complaint today.   Past Medical History  Diagnosis Date  . Hypothyroidism (acquired) 2006  . Hyperlipidemia   . Obesity     gastric bpg 2011, weight then 400 lbs, lost 100 lbs in 60 days.  . Atrial fibrillation with rapid ventricular response ([place]) 04/2015   Past Surgical History  Procedure Laterality Date  . Thyroidectomy  2006    for nodules  . Cholecystectomy    . Vaginal hysterectomy    . Appendectomy    . Gastric bypass  2011  . Esophagogastroduodenoscopy    . Colonoscopy      Current Outpatient Prescriptions  Medication Sig Dispense Refill  . apixaban (ELIQUIS)  5 MG TABS tablet Take 1 tablet (5 mg total) by mouth 2 (two) times daily. 60 tablet 11  . diltiazem (CARDIZEM CD) 120 MG 24 hr capsule Take 1 capsule (120 mg total) by mouth daily. 30 capsule 11  . flecainide (TAMBOCOR) 50 MG tablet Take 1 tablet (50 mg total) by mouth every 12 (twelve) hours. 60 tablet 11  . FORFIVO XL 450 MG TB24 Take 450 mg by mouth daily.    Marland Kitchen levothyroxine (SYNTHROID, LEVOTHROID) 100 MCG tablet Take 100 mcg by mouth daily.    .  Multiple Vitamin (MULTIVITAMIN WITH MINERALS) TABS tablet Take 1 tablet by mouth daily.    . Naproxen Sod-Diphenhydramine (ALEVE PM PO) Take 2 tablets by mouth at bedtime.     No current facility-administered medications for this encounter.    No Known Allergies  Social History   Social History  . Marital Status: Married    Spouse Name: N/A  . Number of Children: N/A  . 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 Smoking status: Never Smoker   . Smokeless tobacco: Not on file  . Alcohol Use: No     Comment: rare glass of wine  . Drug Use: No  . Sexual Activity: Not on file   Other Topics Concern  . Not on file   Social History Narrative   Pt lives in [place] alone.     Family History  Problem Relation Age of Onset  . Heart failure Father   . Heart failure Maternal Grandmother   . Heart failure Maternal Grandfather     ROS- All systems are reviewed and negative except as per the HPI above  Physical Exam: Filed Vitals:   05/06/15 1409  BP: 132/86  Pulse: 101  Height: 5\' 7"  (1.702 m)  Weight: 245 lb (111.131 kg)    GEN- The patient is well appearing, alert and oriented x 3 today.   Head- normocephalic, atraumatic Eyes-  Sclera clear, conjunctiva pink Ears- hearing intact Oropharynx- clear Neck- supple, no JVP Lymph- no cervical lymphadenopathy Lungs- Clear to ausculation bilaterally, normal work of breathing Heart- Regular rate and rhythm, no murmurs, rubs or gallops, PMI not laterally displaced GI- soft, NT, ND, + BS Extremities- no clubbing, cyanosis, or edema MS- no significant deformity or atrophy Skin- no rash or lesion Psych- euthymic mood, full affect Neuro- strength and sensation are intactContinue flecainide 50 mg bid If afib burden continues to be an issue, may need to  incre  EKG-Sinus tach at 101 bpm, Pr int 176 ms, QrS int 86 ms, QTc 471 ms. Epic records  reviewed  Assessment and Plan: 1. Symptomatic PAF May need to increase  dose to flecainide 100 mg bid if continues with higher afib burden Continue diltiazem Continue apixaban Pt is not longer taking antiinflammatories, can take tylenol per label instructions  2. Chest pain Scheduled for outpt  2 day lexi myoview  F/u in afib clinic in two-three weeks.  Geroge Baseman Carroll, [facility] [address] [place], [place] [address] [phone]

## 2015-05-21 ENCOUNTER — Ambulatory Visit (HOSPITAL_COMMUNITY): Payer: Managed Care, Other (non HMO) | Attending: Cardiovascular Disease

## 2015-05-21 DIAGNOSIS — I4891 Unspecified atrial fibrillation: Secondary | ICD-10-CM

## 2015-05-21 DIAGNOSIS — R42 Dizziness and giddiness: Secondary | ICD-10-CM | POA: Diagnosis not present

## 2015-05-21 DIAGNOSIS — R0789 Other chest pain: Secondary | ICD-10-CM | POA: Diagnosis not present

## 2015-05-21 DIAGNOSIS — R002 Palpitations: Secondary | ICD-10-CM | POA: Diagnosis not present

## 2015-05-21 DIAGNOSIS — R0609 Other forms of dyspnea: Secondary | ICD-10-CM | POA: Diagnosis not present

## 2015-05-21 MED ORDER — TECHNETIUM TC 99M SESTAMIBI GENERIC - CARDIOLITE
33.0000 | Freq: Once | INTRAVENOUS | Status: AC | PRN
  Administered 2015-05-21: 33 via INTRAVENOUS

## 2015-05-22 ENCOUNTER — Ambulatory Visit (HOSPITAL_COMMUNITY): Payer: Managed Care, Other (non HMO)

## 2015-05-22 ENCOUNTER — Ambulatory Visit (HOSPITAL_COMMUNITY): Payer: Managed Care, Other (non HMO) | Attending: Nurse Practitioner

## 2015-05-22 DIAGNOSIS — I4891 Unspecified atrial fibrillation: Secondary | ICD-10-CM | POA: Diagnosis not present

## 2015-05-22 LAB — MYOCARDIAL PERFUSION IMAGING
CHL CUP NUCLEAR SRS: 2
CHL CUP NUCLEAR SSS: 4
CSEPPHR: 131 {beats}/min
LHR: 0.42
LV dias vol: 125 mL
LVSYSVOL: 50 mL
Rest HR: 126 {beats}/min
SDS: 2
TID: 0.99

## 2015-05-22 MED ORDER — REGADENOSON 0.4 MG/5ML IV SOLN
0.4000 mg | Freq: Once | INTRAVENOUS | Status: AC
  Administered 2015-05-22: 0.4 mg via INTRAVENOUS

## 2015-05-22 MED ORDER — TECHNETIUM TC 99M SESTAMIBI GENERIC - CARDIOLITE
31.9000 | Freq: Once | INTRAVENOUS | Status: AC | PRN
  Administered 2015-05-22: 31.9 via INTRAVENOUS

## 2015-05-28 ENCOUNTER — Inpatient Hospital Stay (HOSPITAL_COMMUNITY)
Admission: RE | Admit: 2015-05-28 | Payer: Managed Care, Other (non HMO) | Source: Ambulatory Visit | Admitting: Nurse Practitioner

## 2015-05-29 ENCOUNTER — Encounter (HOSPITAL_COMMUNITY): Payer: Self-pay | Admitting: Nurse Practitioner

## 2015-05-29 ENCOUNTER — Ambulatory Visit (HOSPITAL_COMMUNITY)
Admission: RE | Admit: 2015-05-29 | Discharge: 2015-05-29 | Disposition: A | Discharge status: Home / Self Care | Payer: Managed Care, Other (non HMO) | Source: Ambulatory Visit | Attending: Nurse Practitioner | Admitting: Nurse Practitioner

## 2015-05-29 VITALS — BP 116/84 | HR 82 | Ht 67.0 in | Wt 246.6 lb

## 2015-05-29 DIAGNOSIS — Z79899 Other long term (current) drug therapy: Secondary | ICD-10-CM | POA: Diagnosis not present

## 2015-05-29 DIAGNOSIS — I480 Paroxysmal atrial fibrillation: Secondary | ICD-10-CM

## 2015-05-29 DIAGNOSIS — I4891 Unspecified atrial fibrillation: Secondary | ICD-10-CM

## 2015-05-29 DIAGNOSIS — Z8249 Family history of ischemic heart disease and other diseases of the circulatory system: Secondary | ICD-10-CM | POA: Insufficient documentation

## 2015-05-29 DIAGNOSIS — Z7902 Long term (current) use of antithrombotics/antiplatelets: Secondary | ICD-10-CM | POA: Diagnosis not present

## 2015-05-29 DIAGNOSIS — E039 Hypothyroidism, unspecified: Secondary | ICD-10-CM | POA: Insufficient documentation

## 2015-05-29 DIAGNOSIS — E785 Hyperlipidemia, unspecified: Secondary | ICD-10-CM | POA: Diagnosis not present

## 2015-05-29 DIAGNOSIS — Z9884 Bariatric surgery status: Secondary | ICD-10-CM | POA: Insufficient documentation

## 2015-05-29 DIAGNOSIS — E669 Obesity, unspecified: Secondary | ICD-10-CM | POA: Diagnosis not present

## 2015-05-29 MED ORDER — FLECAINIDE ACETATE 50 MG PO TABS: 75.0000 mg | ORAL_TABLET | Freq: Two times a day (BID) | ORAL | Status: DC

## 2015-05-29 NOTE — Patient Instructions (Signed)
Your physician has recommended you make the following change in your medication:  1)Increase flecainide to 75mg  (1 1/2 tablets) twice daily

## 2015-05-29 NOTE — Progress Notes (Signed)
Patient ID: Monique Kelly, female   DOB: November 15, 1955, 60 y.o.   MRN: [id number]     Primary Care Physician: Geanie Cooley., MD Referring Physician: [facility] f/u   Monique Kelly is a 60 y.o. female with a h/o gastric bypass 2011, hypothyroid, obesity, but no known cardiac issues. Stress test and echo before gastric bypass were OK. FH CHF, not CAD. CT scan performed 2015 done for GI issues showed coronary calcifications.  For the last 3-4 days, pt has felt fluttering in her chest, had chest pressure and felt tired. Until admission, no other symptoms. Today, fluttering was worse, pt noticed dizziness and had DOE. Chest pressure was worse, reaching a 5-6/10. No change in pain with deep inspiration. She was at work and the NP on site checked her and recommended ER evaluation for irregular HR. In the ER, the chest pressure improved with IV Cardizem, which slowed her heart rate. She briefly converted to SR, but went back into fib.  She has had these symptoms before. Felt it after her dad's funeral, but it was very brief. Would only have symptoms every other year or so. She also has a history of fainting spells as a child, she would eat afterwards and feel better, no diagnosis ever made.  Patient was admitted and started on IV heparin. IV diltiazem was continued. She ruled out for MI. TSH was within normal limits. Chest x-ray revealed borderline cardiomegaly pulmonary vascular congestion. She did become hypotensive overnight. Cardizem was decreased and later changed to 120 mg daily She was started on flecainide 50 mg twice daily. CHA2DS2-VASc Score = 1. IV heparin was discontinued and Eliquis 5 mg twice daily was started. She had a 2-D echocardiogram which revealed an EF of 55-60% with normal wall motion, G1DD, Mild MR and mildly dilated LA.  Follow-up in atrial fibrillation clinic arranged. The patient was seen by Dr. Burt Knack who felt she was stable for DC home.  Seen   in the afib clinic for f/u [facility] 12/14. She was in sinus rhythm. She is tolerating the flecainide, Intervals are wnl limits on flecainide. She will need to be scheduled for outpt stress test for some complaints with chest pain, that seem to be associated with afib with rvr. She has schedule another sleep study with her PCP due to snoring. She had one years ago and used cpap but after bypass surgery, her snoring improved with weight loss and she has had not used  for years. She has had some breakthrough afib right after hospitalization but rhythm has been better lately. She is taking apixaban for chads vasc of 1.    She returns today and is reporting having some palpitations every day that she assumes to be afib, lasting a couple of minutes to several hours. We discussed increasing flecainide and she is in agreement. She had a stress test on flecainide and it was a low risk scan and no arrhythmia. She also reports that she fell outside a couple weeks ago her rt knee is swollen. No obvious bruising but is swollen. Referred to her PCP to evaluate.  Today, she denies symptoms of palpitations, chest pain, shortness of breath, orthopnea, PND, lower extremity edema, dizziness, presyncope, syncope, or neurologic sequela. The patient is tolerating medications without difficulties and is otherwise without complaint today.   Past Medical History  Diagnosis Date  . Hypothyroidism (acquired) 2006  . Hyperlipidemia   . Obesity     gastric bpg 2011, weight then 400 lbs, lost 100 lbs  in 60 days.  . Atrial fibrillation with rapid ventricular response ([place]) 04/2015   Past Surgical History  Procedure Laterality Date  . Thyroidectomy  2006    for nodules  . Cholecystectomy    . Vaginal hysterectomy    . Appendectomy    . Gastric bypass  2011  . Esophagogastroduodenoscopy    . Colonoscopy      Current Outpatient Prescriptions  Medication Sig Dispense Refill  . apixaban (ELIQUIS) 5 MG TABS tablet Take 1 tablet  (5 mg total) by mouth 2 (two) times daily. 60 tablet 11  . diltiazem (CARDIZEM CD) 120 MG 24 hr capsule Take 1 capsule (120 mg total) by mouth daily. 30 capsule 11  . flecainide (TAMBOCOR) 50 MG tablet Take 1.5 tablets (75 mg total) by mouth every 12 (twelve) hours. 60 tablet 11  . FORFIVO XL 450 MG TB24 Take 450 mg by mouth daily.    Marland Kitchen levothyroxine (SYNTHROID, LEVOTHROID) 100 MCG tablet Take 100 mcg by mouth daily.    . Multiple Vitamin (MULTIVITAMIN WITH MINERALS) TABS tablet Take 1 tablet by mouth daily.    . Naproxen Sod-Diphenhydramine (ALEVE PM PO) Take 2 tablets by mouth at bedtime.     No current facility-administered medications for this encounter.    No Known Allergies  Social History   Social History  . Marital Status: Married    Spouse Name: N/A  . Number of Children: N/A  . 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 Smoking status: Never Smoker   . Smokeless tobacco: Not on file  . Alcohol Use: No     Comment: rare glass of wine  . Drug Use: No  . Sexual Activity: Not on file   Other Topics Concern  . Not on file   Social History Narrative   Pt lives in [place] alone.     Family History  Problem Relation Age of Onset  . Heart failure Father   . Heart failure Maternal Grandmother   . Heart failure Maternal Grandfather     ROS- All systems are reviewed and negative except as per the HPI above  Physical Exam: Filed Vitals:   05/29/15 0935  BP: 116/84  Pulse: 82  Height: 5\' 7"  (1.702 m)  Weight: 246 lb 9.6 oz (111.857 kg)    GEN- The patient is well appearing, alert and oriented x 3 today.   Head- normocephalic, atraumatic Eyes-  Sclera clear, conjunctiva pink Ears- hearing intact Oropharynx- clear Neck- supple, no JVP Lymph- no cervical lymphadenopathy Lungs- Clear to ausculation bilaterally, normal work of breathing Heart- Regular rate and rhythm, no murmurs, rubs or gallops,  PMI not laterally displaced GI- soft, NT, ND, + BS Extremities- no clubbing, cyanosis, or edema MS- no significant deformity or atrophy Skin- no rash or lesion Psych- euthymic mood, full affect Neuro- strength and sensation are intact  EKG-Sinus  Rhythm at 82 bpm, Pr int 186 ms, qrs int 96 ms, qtc 441 ms Epic records reviewed  Assessment and Plan: 1. Symptomatic PAF Increase flecainide to 75 mg bid Return to clinic for repeat EKG in one week Continue diltiazem Continue apixaban  2. Chest pain Resolved  Low risk moview    F/u in afib clinic for ekg in one week F/u clinic visit in one month  Butch Penny C. Chandlar Guice, [facility] [address] [place], [place] [address] [phone]

## 2015-06-04 ENCOUNTER — Encounter (HOSPITAL_COMMUNITY): Payer: Managed Care, Other (non HMO) | Admitting: Nurse Practitioner

## 2015-06-10 ENCOUNTER — Ambulatory Visit (HOSPITAL_COMMUNITY)
Admission: RE | Admit: 2015-06-10 | Discharge: 2015-06-10 | Disposition: A | Discharge status: Home / Self Care | Payer: Managed Care, Other (non HMO) | Source: Ambulatory Visit | Attending: Nurse Practitioner | Admitting: Nurse Practitioner

## 2015-06-10 DIAGNOSIS — I480 Paroxysmal atrial fibrillation: Secondary | ICD-10-CM | POA: Diagnosis not present

## 2015-06-10 MED ORDER — FLECAINIDE ACETATE 50 MG PO TABS: 75.0000 mg | ORAL_TABLET | Freq: Two times a day (BID) | ORAL | Status: DC

## 2015-06-10 NOTE — Progress Notes (Addendum)
Pt here today for Ekg only. Butch Penny will review with the pt.   Flecainide increased to 75 mg bid for pt c/o last week of increase in palpitations. She reports less palpitations with higher dose. EKG shows  V at 85 bpm, Pr int 170 ms, qrs int 98 ms, qtc 509 ms. Due to the prolonged QTc will recommend that pt go back to 50 mg dose of flecainde. Last qtc 441 ms. If continues to have palpitations/afib will have to try another approach.

## 2015-07-02 ENCOUNTER — Encounter: Payer: Self-pay | Admitting: *Deleted

## 2015-07-02 ENCOUNTER — Encounter: Payer: Managed Care, Other (non HMO) | Attending: Family Medicine | Admitting: *Deleted

## 2015-07-02 NOTE — Progress Notes (Signed)
  Medical Nutrition Therapy:  Appt start time: 1500 end time:  1600.  Assessment:  Primary concerns today: patient here for meal plan for weight management. She states she had bariatric surgery in 2010 and lost 100 pounds in 60 days at that time. She states she has extra stress in her life with her job and with an aging mother who requires extra care. Even though her sister lives with her mother, she is the sounding block for both of them. She complains of extreme fatigue so doesn't feel up to exercising. She has used a CPAP in the past for sleep apnea, but has not used it lately. She does not cook and tends to pick up fast food on her way home from work in the evenings.  Preferred Learning Style:   No preference indicated   Learning Readiness:   Ready  Change in progress  MEDICATIONS: see list   DIETARY INTAKE:  Usual eating pattern includes 2-3.  meals and occasional snacks per day.  24-hr recall:  B ( AM): dole "fruitsations" with fruit mixed in applesauce OR 3 pretzels with cheese  Snk ( AM): no  L ( PM): convenience with fresh apple, cheese, almonds Snk ( PM): no D ( PM): microwave meal of Stou[employer] or Lean Cuisine with [place] and potatoes OR [employer] Jr Cheeseburger deluxe Snk ( PM): pretzels and cheese Beverages::caffeine free diet soda, cranberry juice,   Usual physical activity: not at this time  Estimated energy needs: 1400 calories 158 g carbohydrates 105 g protein 39 g fat  Progress Towards Goal(s):  In progress.   Nutritional Diagnosis:  NI-1.5 Excessive energy intake As related to activity level.  As evidenced by BMI of 39.    Intervention:  Nutrition counseling education initiated. Discussed Carb Counting by food group as method of portion control, reading food labels, and benefits of increased activity. Also suggested options to reduce her stress by leaving for work earlier to avoid rush hour traffic in AM and use that time to relax in her car reading a book  or taking a walk. Same at the end of her day. Also suggested she resume use of her CPAP machine to assist with better sleep and hopefully decrease her fatigue. .  Teaching Method Utilized: Visual, Auditory  Handouts given during visit include: Carb Counting and Food Label handouts Meal Plan Card  Barriers to learning/adherence to lifestyle change: none  Demonstrated degree of understanding via:  Teach Back   Monitoring/Evaluation:  Dietary intake, exercise, reading food labels, and body weight prn.

## 2015-07-06 ENCOUNTER — Encounter (HOSPITAL_COMMUNITY): Payer: Self-pay | Admitting: Nurse Practitioner

## 2015-07-06 ENCOUNTER — Ambulatory Visit (HOSPITAL_COMMUNITY)
Admission: RE | Admit: 2015-07-06 | Discharge: 2015-07-06 | Disposition: A | Discharge status: Home / Self Care | Payer: Managed Care, Other (non HMO) | Source: Ambulatory Visit | Attending: Nurse Practitioner | Admitting: Nurse Practitioner

## 2015-07-06 VITALS — BP 120/80 | HR 86 | Ht 67.0 in | Wt 247.6 lb

## 2015-07-06 DIAGNOSIS — Z6838 Body mass index (BMI) 38.0-38.9, adult: Secondary | ICD-10-CM | POA: Diagnosis not present

## 2015-07-06 DIAGNOSIS — E039 Hypothyroidism, unspecified: Secondary | ICD-10-CM | POA: Insufficient documentation

## 2015-07-06 DIAGNOSIS — Z79899 Other long term (current) drug therapy: Secondary | ICD-10-CM | POA: Diagnosis not present

## 2015-07-06 DIAGNOSIS — I4891 Unspecified atrial fibrillation: Secondary | ICD-10-CM | POA: Diagnosis present

## 2015-07-06 DIAGNOSIS — I480 Paroxysmal atrial fibrillation: Secondary | ICD-10-CM

## 2015-07-06 DIAGNOSIS — Z7902 Long term (current) use of antithrombotics/antiplatelets: Secondary | ICD-10-CM | POA: Insufficient documentation

## 2015-07-06 DIAGNOSIS — E785 Hyperlipidemia, unspecified: Secondary | ICD-10-CM | POA: Insufficient documentation

## 2015-07-06 DIAGNOSIS — E669 Obesity, unspecified: Secondary | ICD-10-CM | POA: Diagnosis not present

## 2015-07-06 DIAGNOSIS — Z8249 Family history of ischemic heart disease and other diseases of the circulatory system: Secondary | ICD-10-CM | POA: Diagnosis not present

## 2015-07-06 DIAGNOSIS — Z9884 Bariatric surgery status: Secondary | ICD-10-CM | POA: Diagnosis not present

## 2015-07-06 MED ORDER — DILTIAZEM HCL 30 MG PO TABS: ORAL_TABLET | ORAL | Status: DC

## 2015-07-06 NOTE — Progress Notes (Signed)
Patient ID: Monique Kelly, female   DOB: 1956-03-22, 60 y.o.   MRN: [id number]     Primary Care Physician: Geanie Cooley., MD Referring Physician: [facility] f/u   BAILY KNOPS is a 60 y.o. female with a h/o gastric bypass 2011, hypothyroid, obesity, but no known cardiac issues. Stress test and echo before gastric bypass were OK. FH CHF, not CAD. CT scan performed 2015 done for GI issues showed coronary calcifications.     She was at work, several months ago  and the NP on site checked her and recommended ER evaluation for irregular HR. In the ER, the chest pressure improved with IV Cardizem, which slowed her heart rate. She briefly converted to SR, but went back into fib.  She has had these symptoms before. Felt it after her dad's funeral, but it was very brief. Would only have symptoms every other year or so. She also has a history of fainting spells as a child, she would eat afterwards and feel better, no diagnosis ever made.  Patient was admitted and started on IV heparin. IV diltiazem was continued. She ruled out for MI. TSH was within normal limits. Chest x-ray revealed borderline cardiomegaly pulmonary vascular congestion. She did become hypotensive overnight. Cardizem was decreased and later changed to 120 mg daily She was started on flecainide 50 mg twice daily. CHA2DS2-VASc Score = 1. IV heparin was discontinued and Eliquis 5 mg twice daily was started. She had a 2-D echocardiogram which revealed an EF of 55-60% with normal wall motion, G1DD, Mild MR and mildly dilated LA.  Follow-up in atrial fibrillation clinic arranged. The patient was seen by Dr. Burt Knack who felt she was stable for DC home.  Seen  in the afib clinic for f/u [facility] 12/14. She was in sinus rhythm. She is tolerating the flecainide, Intervals are wnl limits on flecainide. She was scheduled for outpt stress test for some complaints with chest pain, that seem to be associated with afib with rvr, low  risk scan. She has scheduled another sleep study with her PCP due to snoring. She had one years ago and used cpap but after bypass surgery, her snoring improved with weight loss and she has had not used  for years. She has had some breakthrough afib right after hospitalization but rhythm has been better lately. She is taking apixaban for chads vasc of 1.    She returned 1/6 and was reporting having some palpitations every day that she assumes to be afib, lasting a couple of minutes to several hours. We discussed increasing flecainide, but QTc lengthened so went back to previous dose. She had a stress test on flecainide and it was a low risk scan and no arrhythmia. She also reports that she fell outside a couple weeks ago her rt knee is swollen. No obvious bruising but is swollen. Referred to her PCP to evaluate.  She is here for f/u reporting that she is having increased afib burden. She decided against repeating sleep study, states she would have had to  pay for the whole test since she had not made her deductible. Saw a nutritionist recently and is trying very hard to lose weight.  Today, she denies symptoms of palpitations, chest pain, shortness of breath, orthopnea, PND, lower extremity edema, dizziness, presyncope, syncope, or neurologic sequela. The patient is tolerating medications without difficulties and is otherwise without complaint today.   Past Medical History  Diagnosis Date  . Hypothyroidism (acquired) 2006  . Hyperlipidemia   .  Obesity     gastric bpg 2011, weight then 400 lbs, lost 100 lbs in 60 days.  . Atrial fibrillation with rapid ventricular response ([facility]) 04/2015   Past Surgical History  Procedure Laterality Date  . Thyroidectomy  2006    for nodules  . Cholecystectomy    . Vaginal hysterectomy    . Appendectomy    . Gastric bypass  2011  . Esophagogastroduodenoscopy    . Colonoscopy      Current Outpatient Prescriptions  Medication Sig Dispense Refill  . apixaban  (ELIQUIS) 5 MG TABS tablet Take 1 tablet (5 mg total) by mouth 2 (two) times daily. 60 tablet 11  . diltiazem (CARDIZEM CD) 120 MG 24 hr capsule Take 1 capsule (120 mg total) by mouth daily. 30 capsule 11  . flecainide (TAMBOCOR) 50 MG tablet Take 1.5 tablets (75 mg total) by mouth every 12 (twelve) hours. (Patient taking differently: Take 50 mg by mouth every 12 (twelve) hours. ) 90 tablet 6  . ibuprofen (ADVIL,MOTRIN) 800 MG tablet Take 800 mg by mouth every 8 (eight) hours as needed.    Marland Kitchen levothyroxine (SYNTHROID, LEVOTHROID) 100 MCG tablet Take 100 mcg by mouth daily.    . Multiple Vitamin (MULTIVITAMIN WITH MINERALS) TABS tablet Take 1 tablet by mouth daily.    Marland Kitchen buPROPion (WELLBUTRIN XL) 150 MG 24 hr tablet Take 150 mg by mouth daily. Pt is taking 450mg  daily.    Marland Kitchen diltiazem (CARDIZEM) 30 MG tablet Cardizem 30mg  -- take 1 tablet by mouth every 4 hours AS NEEDED for heart rate >100 as long as blood pressure >100. 45 tablet 1   No current facility-administered medications for this encounter.    No Known Allergies  Social History   Social History  . Marital Status: Married    Spouse Name: N/A  . Number of Children: N/A  . 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 Smoking status: Never Smoker   . Smokeless tobacco: Not on file  . Alcohol Use: No     Comment: rare glass of wine  . Drug Use: No  . Sexual Activity: Not on file   Other Topics Concern  . Not on file   Social History Narrative   Pt lives in [place] alone.     Family History  Problem Relation Age of Onset  . Heart failure Father   . Heart failure Maternal Grandmother   . Heart failure Maternal Grandfather     ROS- All systems are reviewed and negative except as per the HPI above  Physical Exam: Filed Vitals:   07/06/15 0935  BP: 120/80  Pulse: 86  Height: 5\' 7"  (1.702 m)  Weight: 247 lb 9.6 oz (112.311 kg)    GEN- The patient is  well appearing, alert and oriented x 3 today.   Head- normocephalic, atraumatic Eyes-  Sclera clear, conjunctiva pink Ears- hearing intact Oropharynx- clear Neck- supple, no JVP Lymph- no cervical lymphadenopathy Lungs- Clear to ausculation bilaterally, normal work of breathing Heart- Regular rate and rhythm, no murmurs, rubs or gallops, PMI not laterally displaced GI- soft, NT, ND, + BS Extremities- no clubbing, cyanosis, or edema MS- no significant deformity or atrophy Skin- no rash or lesion Psych- euthymic mood, full affect Neuro- strength and sensation are intact  EKG-Sinus  Rhythm at 86 bpm, Pr int 170 ms, pr int 170 ms, qrs int 98 ms, qtc 469 ms Epic  records reviewed  Assessment and Plan: 1. Symptomatic PAF Tried to increase flecainide but QTc prolonged. She is 469 ms at baseline , which may be problematic for tikosyn/sotalol as well. I feel amiodarone should  be her last option due to her age.  Discussed ablation and she is not interested in that either.    She understands that untreated OSA will undermine ability to restore SR long term. Continue diltiazem, but will add SA 30 mg Cardizem, to use as needed for breakthrough afib Continue flecainide at 50 mg bid Continue apixaban   2. Chest pain Resolved  Low risk moview   3. Lifestyle risk factors Saw a nutritionist recently and is working on weight. She understands that untreated OSA will undermine ability to restore SR long term.   F/u in afib clinic for ekg in two weeks   Butch Penny C. Carroll, [facility] [address] [place], [place] [address] [phone]

## 2015-07-06 NOTE — Patient Instructions (Signed)
Cardizem 30mg  -- take 1 tablet by mouth every 4 hours AS NEEDED for heart rate >100 as long as blood pressure >100.

## 2015-07-16 ENCOUNTER — Encounter (HOSPITAL_COMMUNITY): Payer: Self-pay | Admitting: Nurse Practitioner

## 2015-07-20 ENCOUNTER — Encounter (HOSPITAL_COMMUNITY): Payer: Self-pay | Admitting: Nurse Practitioner

## 2015-07-20 ENCOUNTER — Ambulatory Visit (HOSPITAL_COMMUNITY)
Admission: RE | Admit: 2015-07-20 | Discharge: 2015-07-20 | Disposition: A | Discharge status: Home / Self Care | Payer: Managed Care, Other (non HMO) | Source: Ambulatory Visit | Attending: Nurse Practitioner | Admitting: Nurse Practitioner

## 2015-07-20 VITALS — BP 112/72 | HR 87 | Ht 67.0 in | Wt 250.6 lb

## 2015-07-20 DIAGNOSIS — E785 Hyperlipidemia, unspecified: Secondary | ICD-10-CM | POA: Insufficient documentation

## 2015-07-20 DIAGNOSIS — E669 Obesity, unspecified: Secondary | ICD-10-CM | POA: Diagnosis not present

## 2015-07-20 DIAGNOSIS — I480 Paroxysmal atrial fibrillation: Secondary | ICD-10-CM | POA: Insufficient documentation

## 2015-07-20 DIAGNOSIS — E039 Hypothyroidism, unspecified: Secondary | ICD-10-CM | POA: Diagnosis not present

## 2015-07-20 DIAGNOSIS — I4891 Unspecified atrial fibrillation: Secondary | ICD-10-CM | POA: Diagnosis not present

## 2015-07-20 DIAGNOSIS — Z9884 Bariatric surgery status: Secondary | ICD-10-CM | POA: Insufficient documentation

## 2015-07-20 DIAGNOSIS — Z8249 Family history of ischemic heart disease and other diseases of the circulatory system: Secondary | ICD-10-CM | POA: Insufficient documentation

## 2015-07-20 DIAGNOSIS — Z7902 Long term (current) use of antithrombotics/antiplatelets: Secondary | ICD-10-CM | POA: Diagnosis not present

## 2015-07-20 DIAGNOSIS — Z6839 Body mass index (BMI) 39.0-39.9, adult: Secondary | ICD-10-CM | POA: Diagnosis not present

## 2015-07-20 DIAGNOSIS — Z79899 Other long term (current) drug therapy: Secondary | ICD-10-CM | POA: Insufficient documentation

## 2015-07-20 NOTE — Progress Notes (Signed)
Patient ID: Monique Kelly, female   DOB: [birth year], 60 y.o.   MRN: [id number]     Primary Care Physician: Geanie Cooley., MD Referring Physician: [facility] f/u   Monique Kelly is a 60 y.o. female with a h/o gastric bypass 2011, hypothyroid, obesity, but no known cardiac issues. Stress test and echo before gastric bypass were OK. FH CHF, not CAD. CT scan performed 2015 done for GI issues showed coronary calcifications.  Admitted 12/14, pt  felt fluttering in her chest, had chest pressure and felt tired. Until admission, no other symptoms. Pt also noticed dizziness and had DOE. Chest pressure was worse, reaching a 5-6/10. No change in pain with deep inspiration. She was at work and the NP on site checked her and recommended ER evaluation for irregular HR. In the ER, the chest pressure improved with IV Cardizem, which slowed her heart rate. She briefly converted to SR, but went back into fib.  She has had these symptoms before. Felt it after her dad's funeral, but it was very brief. Would only have symptoms every other year or so. She also has a history of fainting spells as a child, she would eat afterwards and feel better, no diagnosis ever made.  Patient was admitted and started on IV heparin. IV diltiazem was continued. She ruled out for MI. TSH was within normal limits. Chest x-ray revealed borderline cardiomegaly pulmonary vascular congestion. She did become hypotensive overnight. Cardizem was decreased and later changed to 120 mg daily She was started on flecainide 50 mg twice daily. CHA2DS2-VASc Score = 1. IV heparin was discontinued and Eliquis 5 mg twice daily was started. She had a 2-D echocardiogram which revealed an EF of 55-60% with normal wall motion, G1DD, Mild MR and mildly dilated LA.  Follow-up in atrial fibrillation clinic arranged. The patient was seen by Dr. Burt Knack who felt she was stable for DC home.  Seen  in the afib clinic for f/u [name] 12/14.  She was in sinus rhythm. She is tolerating the flecainide, Intervals are wnl limits on flecainide. She will need to be scheduled for outpt stress test for some complaints with chest pain, that seem to be associated with afib with rvr. She has schedule another sleep study with her PCP due to snoring. She had one years ago and used cpap but after bypass surgery, her snoring improved with weight loss and she has had not used  for years. She has had some breakthrough afib right after hospitalization but rhythm has been better lately. She is taking apixaban for chads vasc of 1.    She returned to afib clinic and was reporting having some palpitations every day that she assumed to be afib, lasting a couple of minutes to several hours. We discussed increasing flecainide and she is in agreement. She had a stress test on flecainide and it was a low risk scan and no arrhythmia. She also reports that she fell outside a couple weeks ago her rt knee is swollen. No obvious bruising but is swollen. Referred to her PCP to evaluate.  Increased flecainide to 75 mg bid but f/u EKG showed QTc prolonging to 503 ms and flecainide was lowered back to 50 mg bid.. Reviewed with Dr. Rayann Heman who felt that she mihgt have problems with QTc porlongation and the better option for her may be ablation.  Pt is seen in the office today after having afib with rvr and being seen in the ER in [place] on Thursday. She was given  IV medicine which slowed her down. EKG today shows SR. Continues on flecainide 50 mg bid but is having breakthrough afib every other day.  We discussed getting an appointment with Dr. Rayann Heman to discuss ablation and she is in agreement.  Today, she denies symptoms of palpitations, chest pain, shortness of breath, orthopnea, PND, lower extremity edema, dizziness, presyncope, syncope, or neurologic sequela. The patient is tolerating medications without difficulties and is otherwise without complaint today.   Past Medical  History  Diagnosis Date  . Hypothyroidism (acquired) 2006  . Hyperlipidemia   . Obesity     gastric bpg 2011, weight then 400 lbs, lost 100 lbs in 60 days.  . Atrial fibrillation with rapid ventricular response ([facility]) 04/2015   Past Surgical History  Procedure Laterality Date  . Thyroidectomy  2006    for nodules  . Cholecystectomy    . Vaginal hysterectomy    . Appendectomy    . Gastric bypass  2011  . Esophagogastroduodenoscopy    . Colonoscopy      Current Outpatient Prescriptions  Medication Sig Dispense Refill  . apixaban (ELIQUIS) 5 MG TABS tablet Take 1 tablet (5 mg total) by mouth 2 (two) times daily. 60 tablet 11  . buPROPion (WELLBUTRIN XL) 150 MG 24 hr tablet Take 150 mg by mouth daily. Pt is taking 450mg  daily.    Marland Kitchen diltiazem (CARDIZEM CD) 120 MG 24 hr capsule Take 1 capsule (120 mg total) by mouth daily. 30 capsule 11  . diltiazem (CARDIZEM) 30 MG tablet Cardizem 30mg  -- take 1 tablet by mouth every 4 hours AS NEEDED for heart rate >100 as long as blood pressure >100. 45 tablet 1  . flecainide (TAMBOCOR) 50 MG tablet Take 1.5 tablets (75 mg total) by mouth every 12 (twelve) hours. (Patient taking differently: Take 50 mg by mouth every 12 (twelve) hours. ) 90 tablet 6  . ibuprofen (ADVIL,MOTRIN) 800 MG tablet Take 800 mg by mouth every 8 (eight) hours as needed.    Marland Kitchen levothyroxine (SYNTHROID, LEVOTHROID) 100 MCG tablet Take 100 mcg by mouth daily.    . Multiple Vitamin (MULTIVITAMIN WITH MINERALS) TABS tablet Take 1 tablet by mouth daily.     No current facility-administered medications for this encounter.    No Known Allergies  Social History   Social History  . Marital Status: Married    Spouse Name: N/A  . Number of Children: N/A  . 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 Smoking status: Never Smoker   . Smokeless tobacco: Not on file  . Alcohol Use: No     Comment: rare glass of wine    . Drug Use: No  . Sexual Activity: Not on file   Other Topics Concern  . Not on file   Social History Narrative   Pt lives in [place] alone.     Family History  Problem Relation Age of Onset  . Heart failure Father   . Heart failure Maternal Grandmother   . Heart failure Maternal Grandfather     ROS- All systems are reviewed and negative except as per the HPI above  Physical Exam: Filed Vitals:   07/20/15 1536  BP: 112/72  Pulse: 87  Height: 5\' 7"  (1.702 m)  Weight: 250 lb 9.6 oz (113.671 kg)    GEN- The patient is well appearing, alert and oriented x 3 today.   Head- normocephalic, atraumatic Eyes-  Sclera clear, conjunctiva pink Ears- hearing intact Oropharynx- clear Neck- supple, no JVP Lymph- no cervical lymphadenopathy Lungs- Clear to ausculation bilaterally, normal work of breathing Heart- Regular rate and rhythm, no murmurs, rubs or gallops, PMI not laterally displaced GI- soft, NT, ND, + BS Extremities- no clubbing, cyanosis, or edema MS- no significant deformity or atrophy Skin- no rash or lesion Psych- euthymic mood, full affect Neuro- strength and sensation are intact  EKG-Sinus  Rhythm at 87 bpm, Pr int 162 ms, qrs int 88 ms, qtc 447 ms Epic records reviewed  Assessment and Plan: 1. Symptomatic PAF Stay on flecainide at 50 mg bid since qtc prolonged at 75 mg bid In fact most of the qt intervals on baseline SR EKG's are over 440 ms, which may be problematic for using other qtc prolonging drugs. She may be a good candidate for ablation, will refer to Dr. Rayann Heman. Continue diltiazem Continue apixaban  2. Chest pain Resolved  Recent low risk moview    afib clinic as needed  Butch Penny C. Kareen Jefferys, [facility] [address] [place], [place] [address] [phone]

## 2015-07-22 ENCOUNTER — Ambulatory Visit (HOSPITAL_COMMUNITY): Payer: Managed Care, Other (non HMO) | Admitting: Nurse Practitioner

## 2015-08-10 ENCOUNTER — Ambulatory Visit (INDEPENDENT_AMBULATORY_CARE_PROVIDER_SITE_OTHER): Payer: Managed Care, Other (non HMO) | Admitting: Internal Medicine

## 2015-08-10 ENCOUNTER — Encounter: Payer: Self-pay | Admitting: Internal Medicine

## 2015-08-10 VITALS — BP 126/92 | HR 77 | Ht 67.0 in | Wt 250.6 lb

## 2015-08-10 DIAGNOSIS — G4733 Obstructive sleep apnea (adult) (pediatric): Secondary | ICD-10-CM | POA: Insufficient documentation

## 2015-08-10 DIAGNOSIS — I480 Paroxysmal atrial fibrillation: Secondary | ICD-10-CM | POA: Diagnosis not present

## 2015-08-10 HISTORY — DX: Paroxysmal atrial fibrillation: I48.0

## 2015-08-10 HISTORY — DX: Morbid (severe) obesity due to excess calories: E66.01

## 2015-08-10 NOTE — Progress Notes (Signed)
Electrophysiology Office Note   Date:  08/10/2015   ID:  [name], [name] Feb 08, 1956, MRN [id number]  PCP:  Geanie Cooley., MD  Cardiologist:  Dr Burt Knack Primary Electrophysiologist: [name], MD    Chief Complaint  Patient presents with  . New Patient (Initial Visit)    AFIB     History of Present Illness: Monique Kelly is a 60 y.o. female who presents today for electrophysiology evaluation.   The patient reports having symptoms of palpitations, chest discomfort,and SOB for two weeks in December.  She presented to [facility] 04/28/15 and was diagnosed with atrial fibrillation.  She was placed on eliquis, diltiazem, and flecainide 50mg  bid.  She continued to have episodes of afib and her flecainide was increased to 75mg  BID.  Due to QT prolongation, her flecainide was reduced to 50mg  BID.  She was given cardizem to take prn.  She reports that with an AF episode, she took the cardizem and had brief syncope while in bed.  The following day, she went to urgent care and was sent to the ER for AF with RVR.  She was given IV diltiazem and monitored.  She has not tried other AADs.   Today, she denies symptoms of palpitations, chest pain, shortness of breath, orthopnea, PND, lower extremity edema, claudication, dizziness, presyncope, syncope, bleeding, or neurologic sequela. The patient is tolerating medications without difficulties and is otherwise without complaint today.    Past Medical History  Diagnosis Date  . Hypothyroidism (acquired) 2006    s/p thyroidectomy for nodules  . Hyperlipidemia   . Obesity     gastric bpg 2011, weight then 400 lbs, lost 100 lbs in 60 days.  . Atrial fibrillation with rapid ventricular response ([place]) 04/2015  . Obstructive sleep apnea     does not use cpap, does not have a following physician   Past Surgical History  Procedure Laterality Date  . Thyroidectomy  2006    for nodules  . Cholecystectomy    . Vaginal hysterectomy    .  Appendectomy    . Gastric bypass  2011  . Esophagogastroduodenoscopy    . Colonoscopy       Current Outpatient Prescriptions  Medication Sig Dispense Refill  . apixaban (ELIQUIS) 5 MG TABS tablet Take 1 tablet (5 mg total) by mouth 2 (two) times daily. 60 tablet 11  . buPROPion (WELLBUTRIN XL) 150 MG 24 hr tablet Take 150 mg by mouth daily. Pt is taking 450mg  daily.    Marland Kitchen diltiazem (CARDIZEM CD) 120 MG 24 hr capsule Take 1 capsule (120 mg total) by mouth daily. 30 capsule 11  . diltiazem (CARDIZEM) 30 MG tablet Cardizem 30mg  -- take 1 tablet by mouth every 4 hours AS NEEDED for heart rate >100 as long as blood pressure >100. 45 tablet 1  . flecainide (TAMBOCOR) 50 MG tablet Take 50 mg by mouth 2 (two) times daily.    Marland Kitchen ibuprofen (ADVIL,MOTRIN) 800 MG tablet Take 800 mg by mouth every 8 (eight) hours as needed.    Marland Kitchen levothyroxine (SYNTHROID, LEVOTHROID) 100 MCG tablet Take 100 mcg by mouth daily.    . Multiple Vitamin (MULTIVITAMIN WITH MINERALS) TABS tablet Take 1 tablet by mouth daily.     No current facility-administered medications for this visit.    Allergies:   Review of patient's allergies indicates no known allergies.   Social History:  The patient  reports that she has never smoked. She does not have any smokeless  tobacco history on file. She reports that she does not drink alcohol or use illicit drugs.   Family History:  The patient's  family history includes Heart failure in her father, maternal grandfather, and maternal grandmother.    ROS:  Please see the history of present illness.   All other systems are reviewed and negative.    PHYSICAL EXAM: VS:  BP 126/92 mmHg  Pulse 77  Ht 5\' 7"  (1.702 m)  Wt 250 lb 9.6 oz (113.671 kg)  BMI 39.24 kg/m2 , BMI Body mass index is 39.24 kg/(m^2). GEN: overweight, in no acute distress HEENT: normal Neck: no JVD, carotid bruits, or masses Cardiac: RRR; no murmurs, rubs, or gallops,no edema  Respiratory:  clear to auscultation  bilaterally, normal work of breathing GI: soft, nontender, nondistended, + BS MS: no deformity or atrophy Skin: warm and dry  Neuro:  Strength and sensation are intact Psych: euthymic mood, full affect  EKG:  EKG is ordered today. The ekg ordered today shows sinus rhythm 77 bpm, poor R wave progression, Qtc 450 msec.   Recent Labs: 04/28/2015: ALT 18; BUN 14; Creatinine, Ser 0.91; Potassium 3.8; Sodium 142; TSH 2.192 04/29/2015: Hemoglobin 13.1; Platelets 238    Wt Readings from Last 3 Encounters:  08/10/15 250 lb 9.6 oz (113.671 kg)  07/20/15 250 lb 9.6 oz (113.671 kg)  07/06/15 247 lb 9.6 oz (112.311 kg)      Other studies Reviewed: Additional studies/ records that were reviewed today include: AF clinic notes,  Prior ekgs, echo Review of the above records today demonstrates: EF normal, mild MR, LA 46 mm   ASSESSMENT AND PLAN:  1.  Paroxysmal atrial fibrillation The patient has symptomatic atrial fibrillation.  She has failed medical therapy with diltiazem and flecainide.  QT prolongation with flecainide is worrisome.  Therapeutic strategies for afib including medicine (multaq or amiodarone) and ablation were discussed in detail with the patient today. Risk, benefits, and alternatives to EP study and radiofrequency ablation for afib were also discussed in detail today. These risks include but are not limited to stroke, bleeding, vascular damage, tamponade, perforation, damage to the esophagus, lungs, and other structures, pulmonary vein stenosis, worsening renal function, and death. The patient understands these risk and wishes to proceed.  We will therefore proceed with catheter ablation at the next available time.  2. Obesity Weight reduction encouraged Regular exercise encouraged I have reviewed the patients BMI and decreased success rates with ablation at length today.  Weight loss is strongly advised.  Per Guijian et al (PACE 2013; [id number]), patients with BMI 25-29.9  (obese) have a 27% increase in AF recurrence post ablation.  Patients with BMI >30 have a 31% increase in AF recurrence post ablation when compared to those with BMI <25.  3. OSA Not tolerant of CPAP previously Will refer to Dr Maxwell Caul for sleep apnea management  Current medicines are reviewed at length with the patient today.   The patient does not have concerns regarding her medicines.  The following changes were made today:  none    Signed, [name], MD  08/10/2015 9:27 AM     [facility] [address] [place] [address] [phone] (office) [phone] (fax)

## 2015-08-10 NOTE — Patient Instructions (Addendum)
Medication Instructions:  Your physician recommends that you continue on your current medications as directed. Please refer to the Current Medication list given to you today.   Labwork: Your physician recommends that you return for lab work on 08/24/15   Testing/Procedures:  You have been referred to Monique Princella Ion sleep apnea  Your physician has requested that you have cardiac CT. Cardiac computed tomography (CT) is a painless test that uses an x-ray machine to take clear, detailed pictures of your heart. For further information please visit [facility]. Please follow instruction sheet as given.----week of 08/31/15--will schedule and call you  Your physician has recommended that you have an ablation. Catheter ablation is a medical procedure used to treat some cardiac arrhythmias (irregular heartbeats). During catheter ablation, a long, thin, flexible tube is put into a blood vessel in your groin (upper thigh), or neck. This tube is called an ablation catheter. It is then guided to your heart through the blood vessel. Radio frequency waves destroy small areas of heart tissue where abnormal heartbeats may cause an arrhythmia to start. Please see the instruction sheet given to you today.---09/08/15  Please arrive at the [place] [facility] on 09/08/15 at 5:30am.  Do not eat or drink after midnight the night prior to the procedure.  Do not take any medications the morning of your procedure.      Follow-Up: Your physician recommends that you schedule a follow-up appointment in: 4 weeks from 09/08/15 with Monique Palau, NP and 3 months from 09/08/15 with Monique Kelly   Any Other Special Instructions Will Be Listed Below (If Applicable).     If you need a refill on your cardiac medications before your next appointment, please call your pharmacy.

## 2015-08-12 ENCOUNTER — Other Ambulatory Visit: Payer: Self-pay | Admitting: *Deleted

## 2015-08-12 DIAGNOSIS — I480 Paroxysmal atrial fibrillation: Secondary | ICD-10-CM

## 2015-08-13 ENCOUNTER — Encounter: Payer: Self-pay | Admitting: Internal Medicine

## 2015-08-24 ENCOUNTER — Other Ambulatory Visit (INDEPENDENT_AMBULATORY_CARE_PROVIDER_SITE_OTHER): Payer: Managed Care, Other (non HMO) | Admitting: *Deleted

## 2015-08-24 DIAGNOSIS — I480 Paroxysmal atrial fibrillation: Secondary | ICD-10-CM | POA: Diagnosis not present

## 2015-08-24 LAB — CBC WITH DIFFERENTIAL/PLATELET
BASOS ABS: 0 {cells}/uL (ref 0–200)
Basophils Relative: 0 %
EOS ABS: 135 {cells}/uL (ref 15–500)
EOS PCT: 1 %
HEMATOCRIT: 39.4 % (ref 35.0–45.0)
HEMOGLOBIN: 12.9 g/dL (ref 11.7–15.5)
LYMPHS ABS: 2430 {cells}/uL (ref 850–3900)
Lymphocytes Relative: 18 %
MCH: 31.2 pg (ref 27.0–33.0)
MCHC: 32.7 g/dL (ref 32.0–36.0)
MCV: 95.2 fL (ref 80.0–100.0)
MONO ABS: 675 {cells}/uL (ref 200–950)
MPV: 10.8 fL (ref 7.5–12.5)
Monocytes Relative: 5 %
NEUTROS PCT: 76 %
Neutro Abs: 10260 cells/uL — ABNORMAL HIGH (ref 1500–7800)
Platelets: 278 10*3/uL (ref 140–400)
RBC: 4.14 MIL/uL (ref 3.80–5.10)
RDW: 14 % (ref 11.0–15.0)
WBC: 13.5 10*3/uL — ABNORMAL HIGH (ref 3.8–10.8)

## 2015-08-24 LAB — BASIC METABOLIC PANEL
BUN: 13 mg/dL (ref 7–25)
CALCIUM: 8.9 mg/dL (ref 8.6–10.4)
CHLORIDE: 105 mmol/L (ref 98–110)
CO2: 29 mmol/L (ref 20–31)
Creat: 0.86 mg/dL (ref 0.50–1.05)
GLUCOSE: 51 mg/dL — AB (ref 65–99)
Potassium: 4.2 mmol/L (ref 3.5–5.3)
SODIUM: 142 mmol/L (ref 135–146)

## 2015-08-28 ENCOUNTER — Encounter (HOSPITAL_COMMUNITY): Payer: Self-pay

## 2015-08-28 ENCOUNTER — Ambulatory Visit (HOSPITAL_COMMUNITY)
Admission: RE | Admit: 2015-08-28 | Discharge: 2015-08-28 | Disposition: A | Discharge status: Home / Self Care | Payer: Managed Care, Other (non HMO) | Source: Ambulatory Visit | Attending: Internal Medicine | Admitting: Internal Medicine

## 2015-08-28 DIAGNOSIS — I480 Paroxysmal atrial fibrillation: Secondary | ICD-10-CM | POA: Insufficient documentation

## 2015-08-28 DIAGNOSIS — I2510 Atherosclerotic heart disease of native coronary artery without angina pectoris: Secondary | ICD-10-CM | POA: Diagnosis not present

## 2015-08-28 DIAGNOSIS — M47814 Spondylosis without myelopathy or radiculopathy, thoracic region: Secondary | ICD-10-CM | POA: Diagnosis not present

## 2015-08-28 DIAGNOSIS — R918 Other nonspecific abnormal finding of lung field: Secondary | ICD-10-CM | POA: Insufficient documentation

## 2015-08-28 MED ORDER — METOPROLOL TARTRATE 1 MG/ML IV SOLN
INTRAVENOUS | Status: AC
  Administered 2015-08-28: 5 mg via INTRAVENOUS
  Filled 2015-08-28: qty 5

## 2015-08-28 MED ORDER — METOPROLOL TARTRATE 1 MG/ML IV SOLN
5.0000 mg | Freq: Once | INTRAVENOUS | Status: AC
  Administered 2015-08-28: 5 mg via INTRAVENOUS

## 2015-08-28 MED ORDER — IOPAMIDOL (ISOVUE-370) INJECTION 76%
INTRAVENOUS | Status: AC
  Administered 2015-08-28: 80 mL
  Filled 2015-08-28: qty 100

## 2015-09-07 NOTE — Anesthesia Preprocedure Evaluation (Addendum)
Anesthesia Evaluation  Patient identified by MRN, date of birth, ID band Patient awake    Reviewed: Allergy & Precautions, NPO status , Patient's Chart, lab work & pertinent test results  Airway Mallampati: II  TM Distance: >3 FB Neck ROM: Full    Dental  (+) Teeth Intact   Pulmonary sleep apnea ,    breath sounds clear to auscultation       Cardiovascular + dysrhythmias Atrial Fibrillation  Rhythm:Irregular Rate:Normal     Neuro/Psych negative neurological ROS  negative psych ROS   GI/Hepatic negative GI ROS, Neg liver ROS,   Endo/Other  Hypothyroidism   Renal/GU negative Renal ROS  negative genitourinary   Musculoskeletal negative musculoskeletal ROS (+)   Abdominal   Peds negative pediatric ROS (+)  Hematology negative hematology ROS (+)   Anesthesia Other Findings   Reproductive/Obstetrics negative OB ROS                           04/2015 Echo - Left ventricle: The cavity size was normal. Wall thickness was normal. Systolic function was normal. The estimated ejection fraction was in the range of 55% to 60%. Wall motion was normal; there were no regional wall motion abnormalities. Doppler parameters are consistent with abnormal left ventricular relaxation (grade 1 diastolic dysfunction). - Mitral valve: There was mild regurgitation. - Left atrium: The atrium was mildly dilated.  07/2015 EKG: NSR   Anesthesia Physical Anesthesia Plan  ASA: III  Anesthesia Plan: MAC   Post-op Pain Management:    Induction: Intravenous  Airway Management Planned: Simple Face Mask and Natural Airway  Additional Equipment:   Intra-op Plan:   Post-operative Plan:   Informed Consent: I have reviewed the patients History and Physical, chart, labs and discussed the procedure including the risks, benefits and alternatives for the proposed anesthesia with the patient or authorized representative  who has indicated his/her understanding and acceptance.   Dental advisory given  Plan Discussed with: CRNA  Anesthesia Plan Comments: ( Ms. Kritzer was informed if she does not tolerate sedation we will convert to GA. )       Anesthesia Quick Evaluation

## 2015-09-08 ENCOUNTER — Ambulatory Visit (HOSPITAL_COMMUNITY): Payer: Managed Care, Other (non HMO) | Admitting: Anesthesiology

## 2015-09-08 ENCOUNTER — Encounter (HOSPITAL_COMMUNITY): Payer: Self-pay | Admitting: Anesthesiology

## 2015-09-08 ENCOUNTER — Encounter (HOSPITAL_COMMUNITY)
Admission: RE | Disposition: A | Discharge status: Home / Self Care | Payer: Self-pay | Source: Ambulatory Visit | Attending: Internal Medicine

## 2015-09-08 ENCOUNTER — Ambulatory Visit (HOSPITAL_COMMUNITY)
Admission: RE | Admit: 2015-09-08 | Discharge: 2015-09-09 | Disposition: A | Discharge status: Home / Self Care | Payer: Managed Care, Other (non HMO) | Source: Ambulatory Visit | Attending: Internal Medicine | Admitting: Internal Medicine

## 2015-09-08 DIAGNOSIS — E785 Hyperlipidemia, unspecified: Secondary | ICD-10-CM | POA: Diagnosis not present

## 2015-09-08 DIAGNOSIS — E669 Obesity, unspecified: Secondary | ICD-10-CM | POA: Insufficient documentation

## 2015-09-08 DIAGNOSIS — Z7901 Long term (current) use of anticoagulants: Secondary | ICD-10-CM | POA: Insufficient documentation

## 2015-09-08 DIAGNOSIS — I480 Paroxysmal atrial fibrillation: Secondary | ICD-10-CM | POA: Diagnosis not present

## 2015-09-08 DIAGNOSIS — Z6837 Body mass index (BMI) 37.0-37.9, adult: Secondary | ICD-10-CM | POA: Diagnosis not present

## 2015-09-08 DIAGNOSIS — I4891 Unspecified atrial fibrillation: Secondary | ICD-10-CM

## 2015-09-08 DIAGNOSIS — E039 Hypothyroidism, unspecified: Secondary | ICD-10-CM | POA: Diagnosis not present

## 2015-09-08 DIAGNOSIS — G4733 Obstructive sleep apnea (adult) (pediatric): Secondary | ICD-10-CM | POA: Diagnosis not present

## 2015-09-08 HISTORY — PX: ELECTROPHYSIOLOGIC STUDY: SHX172A

## 2015-09-08 HISTORY — DX: Unspecified atrial fibrillation: I48.91

## 2015-09-08 LAB — CBC
HEMATOCRIT: 39.1 % (ref 36.0–46.0)
Hemoglobin: 12.5 g/dL (ref 12.0–15.0)
MCH: 31.4 pg (ref 26.0–34.0)
MCHC: 32 g/dL (ref 30.0–36.0)
MCV: 98.2 fL (ref 78.0–100.0)
Platelets: 197 10*3/uL (ref 150–400)
RBC: 3.98 MIL/uL (ref 3.87–5.11)
RDW: 14.6 % (ref 11.5–15.5)
WBC: 5 10*3/uL (ref 4.0–10.5)

## 2015-09-08 LAB — POCT ACTIVATED CLOTTING TIME
ACTIVATED CLOTTING TIME: 214 s
ACTIVATED CLOTTING TIME: 291 s
ACTIVATED CLOTTING TIME: 183 s
Activated Clotting Time: 255 seconds
Activated Clotting Time: 301 seconds

## 2015-09-08 SURGERY — ATRIAL FIBRILLATION ABLATION
Anesthesia: General

## 2015-09-08 MED ORDER — DOBUTAMINE IN D5W 4-5 MG/ML-% IV SOLN
INTRAVENOUS | Status: AC
  Filled 2015-09-08: qty 250

## 2015-09-08 MED ORDER — BUPROPION HCL ER (XL) 300 MG PO TB24
450.0000 mg | ORAL_TABLET | Freq: Every day | ORAL | Status: DC
  Administered 2015-09-09: 450 mg via ORAL
  Filled 2015-09-08: qty 1

## 2015-09-08 MED ORDER — IOPAMIDOL (ISOVUE-370) INJECTION 76%
INTRAVENOUS | Status: DC | PRN
  Administered 2015-09-08: 3 mL

## 2015-09-08 MED ORDER — MIDAZOLAM HCL 5 MG/5ML IJ SOLN
INTRAMUSCULAR | Status: DC | PRN
  Administered 2015-09-08: 2 mg via INTRAVENOUS

## 2015-09-08 MED ORDER — PROPOFOL 10 MG/ML IV BOLUS
INTRAVENOUS | Status: DC | PRN
Start: 2015-09-08 — End: 2015-09-08
  Administered 2015-09-08: 80 mg via INTRAVENOUS

## 2015-09-08 MED ORDER — HEPARIN SODIUM (PORCINE) 1000 UNIT/ML IJ SOLN
INTRAMUSCULAR | Status: DC | PRN
  Administered 2015-09-08 (×2): 1000 [IU] via INTRAVENOUS

## 2015-09-08 MED ORDER — APIXABAN 5 MG PO TABS
5.0000 mg | ORAL_TABLET | Freq: Two times a day (BID) | ORAL | Status: DC
  Administered 2015-09-08 – 2015-09-09 (×2): 5 mg via ORAL
  Filled 2015-09-08 (×2): qty 1

## 2015-09-08 MED ORDER — PROPOFOL 500 MG/50ML IV EMUL
INTRAVENOUS | Status: DC | PRN
  Administered 2015-09-08: 75 ug/kg/min via INTRAVENOUS

## 2015-09-08 MED ORDER — ONDANSETRON HCL 4 MG/2ML IJ SOLN: 4.0000 mg | Freq: Four times a day (QID) | INTRAMUSCULAR | Status: DC | PRN

## 2015-09-08 MED ORDER — HYDROCODONE-ACETAMINOPHEN 5-325 MG PO TABS
1.0000 | ORAL_TABLET | ORAL | Status: DC | PRN
  Administered 2015-09-08 (×2): 2 via ORAL
  Administered 2015-09-09: 1 via ORAL
  Filled 2015-09-08: qty 1
  Filled 2015-09-08: qty 2

## 2015-09-08 MED ORDER — OFF THE BEAT BOOK
Freq: Once | Status: AC
  Administered 2015-09-08: 21:00:00
  Filled 2015-09-08: qty 1

## 2015-09-08 MED ORDER — BUPIVACAINE HCL (PF) 0.25 % IJ SOLN
INTRAMUSCULAR | Status: AC
  Filled 2015-09-08: qty 30

## 2015-09-08 MED ORDER — LEVOTHYROXINE SODIUM 100 MCG PO TABS
100.0000 ug | ORAL_TABLET | Freq: Every day | ORAL | Status: DC
  Administered 2015-09-08 – 2015-09-09 (×2): 100 ug via ORAL
  Filled 2015-09-08 (×2): qty 1

## 2015-09-08 MED ORDER — SODIUM CHLORIDE 0.9% FLUSH
3.0000 mL | Freq: Two times a day (BID) | INTRAVENOUS | Status: DC
  Administered 2015-09-08: 3 mL via INTRAVENOUS

## 2015-09-08 MED ORDER — SODIUM CHLORIDE 0.9% FLUSH: 3.0000 mL | INTRAVENOUS | Status: DC | PRN

## 2015-09-08 MED ORDER — BUPIVACAINE HCL (PF) 0.25 % IJ SOLN
INTRAMUSCULAR | Status: DC | PRN
  Administered 2015-09-08: 25 mL

## 2015-09-08 MED ORDER — SODIUM CHLORIDE 0.9 % IV SOLN
INTRAVENOUS | Status: DC
  Administered 2015-09-08: 06:00:00 via INTRAVENOUS

## 2015-09-08 MED ORDER — SODIUM CHLORIDE 0.9 % IV SOLN: 250.0000 mL | INTRAVENOUS | Status: DC | PRN

## 2015-09-08 MED ORDER — DEXTROSE 5 % IV SOLN
10.0000 mg | INTRAVENOUS | Status: DC | PRN
  Administered 2015-09-08: 10 ug/min via INTRAVENOUS

## 2015-09-08 MED ORDER — DOBUTAMINE IN D5W 4-5 MG/ML-% IV SOLN
INTRAVENOUS | Status: DC | PRN
  Administered 2015-09-08: 20 ug/kg/min via INTRAVENOUS

## 2015-09-08 MED ORDER — ONDANSETRON HCL 4 MG/2ML IJ SOLN
INTRAMUSCULAR | Status: DC | PRN
  Administered 2015-09-08: 4 mg via INTRAVENOUS

## 2015-09-08 MED ORDER — ACETAMINOPHEN 325 MG PO TABS: 650.0000 mg | ORAL_TABLET | ORAL | Status: DC | PRN

## 2015-09-08 MED ORDER — PROTAMINE SULFATE 10 MG/ML IV SOLN
INTRAVENOUS | Status: DC | PRN
  Administered 2015-09-08: 30 mg via INTRAVENOUS

## 2015-09-08 MED ORDER — HEPARIN SODIUM (PORCINE) 1000 UNIT/ML IJ SOLN
INTRAMUSCULAR | Status: DC | PRN
Start: 2015-09-08 — End: 2015-09-08
  Administered 2015-09-08: 5000 [IU] via INTRAVENOUS
  Administered 2015-09-08: 12000 [IU] via INTRAVENOUS
  Administered 2015-09-08: 5000 [IU] via INTRAVENOUS
  Administered 2015-09-08: 3000 [IU] via INTRAVENOUS

## 2015-09-08 MED ORDER — HEPARIN SODIUM (PORCINE) 1000 UNIT/ML IJ SOLN
INTRAMUSCULAR | Status: AC
  Filled 2015-09-08: qty 1

## 2015-09-08 MED ORDER — FENTANYL CITRATE (PF) 100 MCG/2ML IJ SOLN
INTRAMUSCULAR | Status: DC | PRN
  Administered 2015-09-08 (×3): 50 ug via INTRAVENOUS
  Administered 2015-09-08 (×2): 25 ug via INTRAVENOUS

## 2015-09-08 MED ORDER — HYDROCODONE-ACETAMINOPHEN 5-325 MG PO TABS
ORAL_TABLET | ORAL | Status: AC
  Filled 2015-09-08: qty 2

## 2015-09-08 MED ORDER — IOPAMIDOL (ISOVUE-370) INJECTION 76%
INTRAVENOUS | Status: AC
  Filled 2015-09-08: qty 50

## 2015-09-08 SURGICAL SUPPLY — 18 items
BAG SNAP BAND KOVER 36X36 (MISCELLANEOUS) ×2 IMPLANT
BLANKET WARM UNDERBOD FULL ACC (MISCELLANEOUS) ×2 IMPLANT
CATH NAVISTAR SMARTTOUCH DF (ABLATOR) ×2 IMPLANT
CATH SOUNDSTAR 3D IMAGING (CATHETERS) ×2 IMPLANT
CATH VARIABLE LASSO NAV 2515 (CATHETERS) ×2 IMPLANT
CATH WEBSTER BI DIR CS D-F CRV (CATHETERS) ×2 IMPLANT
COVER SWIFTLINK CONNECTOR (BAG) ×2 IMPLANT
NEEDLE TRANSEP BRK 71CM 407200 (NEEDLE) ×2 IMPLANT
PACK EP LATEX FREE (CUSTOM PROCEDURE TRAY) ×1
PACK EP LF (CUSTOM PROCEDURE TRAY) ×1 IMPLANT
PAD DEFIB LIFELINK (PAD) ×2 IMPLANT
PATCH CARTO3 (PAD) ×2 IMPLANT
SHEATH AVANTI 11F 11CM (SHEATH) ×2 IMPLANT
SHEATH PINNACLE 7F 10CM (SHEATH) ×4 IMPLANT
SHEATH PINNACLE 9F 10CM (SHEATH) ×2 IMPLANT
SHEATH SWARTZ TS SL2 63CM 8.5F (SHEATH) ×2 IMPLANT
SHIELD RADPAD SCOOP 12X17 (MISCELLANEOUS) ×2 IMPLANT
TUBING SMART ABLATE COOLFLOW (TUBING) ×2 IMPLANT

## 2015-09-08 NOTE — Progress Notes (Signed)
Large hematoma rt groin medial above and to the rt of stick noted after using bedpan. Manual pressure held for 55 minutes. Hematoma softened some, still moderately firm medial and above stick sites; partial resolution. Dr. Rayann Heman paged and notified. Dark bruising medially. Pressure dressing applied. Medicated for pain. Reinstructed regarding bedrest.

## 2015-09-08 NOTE — Progress Notes (Signed)
Pt arrived to h/a with pressure being held rt groin.  Act was 184. Unable to aspirate any of the veinous sheaths so all 3 pulled and direct pressure held until complete hemostasis by S. Owens Shark.  Rt DP palpatable and sterile DSG applied to site.   Pt NRB mask D/C and changed to nasal cannula.  Pt teaching done R\T post sheath care.

## 2015-09-08 NOTE — Progress Notes (Signed)
Rt Groin site assesed, no increase in hematoma size noted at marked site, updated with Dr Rayann Heman,  OK to start  Eliquis at 7 pm  tonight.

## 2015-09-08 NOTE — Discharge Summary (Signed)
ELECTROPHYSIOLOGY PROCEDURE DISCHARGE SUMMARY    Patient ID: Monique Kelly,  MRN: [id number], DOB/AGE: 1956-02-01 60 y.o.  Admit date: 09/08/2015 Discharge date: 09/09/2015  Primary Care Physician: Geanie Cooley., MD Primary Cardiologist: Burt Knack Electrophysiologist: [name], MD  Primary Discharge Diagnosis:  Paroxysmal atrial fibrillation status post ablation this admission  Secondary Discharge Diagnosis:  1.  Obesity 2.  OSA 3.  Hypothyroidism 4.  Hyperlipidemia  Procedures This Admission:  1.  Electrophysiology study and radiofrequency catheter ablation on 09/08/15 by Dr [name].  This study demonstrated sinus rhythm upon presentation; intracardiac echo reveals a moderate sized left atrium with a common ostium to the left superior and inferior pulmonary veins. A PFO was also noted; successful electrical isolation and anatomical encircling of all four pulmonary veins with radiofrequency current; no inducible arrhythmias following ablation both on and off of dobutamine; no early apparent complications.    Brief HPI: Monique Kelly is a 60 y.o. female with a history of paroxysmal atrial fibrillation.  They have failed medical therapy with Flecainide. Risks, benefits, and alternatives to catheter ablation of atrial fibrillation were reviewed with the patient who wished to proceed.  The patient underwent TEE prior to the procedure which demonstrated normal LV function and no LAA thrombus.    Hospital Course:  The patient was admitted and underwent EPS/RFCA of atrial fibrillation with details as outlined above.  They were monitored on telemetry overnight which demonstrated sinus rhythm.  Groin was without complication on the day of discharge.  The patient was examined and considered to be stable for discharge.  Wound care and restrictions were reviewed with the patient.  The patient will be seen back by Roderic Palau, NP in 4 weeks and Dr Rayann Heman in 12 weeks for post ablation  follow up.   If in SR at 4 week appointment, can discontinue Flecainide and Diltiazem.   This patients CHA2DS2-VASc Score and unadjusted Ischemic Stroke Rate (% per year) is equal to 0.6 % stroke rate/year from a score of 1 Above score calculated as 1 point each if present [CHF, HTN, DM, Vascular=MI/PAD/Aortic Plaque, Age if 65-74, or Female] Above score calculated as 2 points each if present [Age > 75, or Stroke/TIA/TE]   Physical Exam: Filed Vitals:   09/08/15 1900 09/08/15 2000 09/09/15 0454 09/09/15 0508  BP: 91/52 96/50 91/37  96/44  Pulse: 75 69 77   Temp: 98.2 F (36.8 C)  97.8 F (36.6 C)   TempSrc: Oral  Oral   Resp: 16 19 18 28   Height:      Weight:   240 lb 4.8 oz (109 kg)   SpO2: 93% 93% 94%     GEN- The patient is obese appearing, alert and oriented x 3 today.   HEENT: normocephalic, atraumatic; sclera clear, conjunctiva pink; hearing intact; oropharynx clear; neck supple  Lungs- Clear to ausculation bilaterally, normal work of breathing.  No wheezes, rales, rhonchi Heart- Regular rate and rhythm, no murmurs, rubs or gallops  GI- soft, non-tender, non-distended, bowel sounds present  Extremities- no clubbing, cyanosis, or edema; DP/PT/radial pulses 2+ bilaterally, groin with stable soft hematoma MS- no significant deformity or atrophy Skin- warm and dry, no rash or lesion Psych- euthymic mood, full affect Neuro- strength and sensation are intact   Labs:   Lab Results  Component Value Date   WBC 5.0 09/08/2015   HGB 12.5 09/08/2015   HCT 39.1 09/08/2015   MCV 98.2 09/08/2015   PLT 197 09/08/2015   No results  for input(s): NA, K, CL, CO2, BUN, CREATININE, CALCIUM, PROT, BILITOT, ALKPHOS, ALT, AST, GLUCOSE in the last 168 hours.  Invalid input(s): LABALBU   Discharge Medications:    Medication List    TAKE these medications        acetaminophen 500 MG tablet  Commonly known as:  TYLENOL  Take 1,000 mg by mouth every 8 (eight) hours as needed.      apixaban 5 MG Tabs tablet  Commonly known as:  ELIQUIS  Take 1 tablet (5 mg total) by mouth 2 (two) times daily.     buPROPion 150 MG 24 hr tablet  Commonly known as:  WELLBUTRIN XL  Take 450 mg by mouth daily. Pt is taking 450mg  daily.     diltiazem 120 MG 24 hr capsule  Commonly known as:  CARDIZEM CD  Take 120 mg by mouth daily.     diltiazem 30 MG tablet  Commonly known as:  CARDIZEM  Cardizem 30mg  -- take 1 tablet by mouth every 4 hours AS NEEDED for heart rate >100 as long as blood pressure >100.     flecainide 50 MG tablet  Commonly known as:  TAMBOCOR  Take 50 mg by mouth 2 (two) times daily.     ibuprofen 800 MG tablet  Commonly known as:  ADVIL,MOTRIN  Take 800 mg by mouth every 8 (eight) hours as needed.     levothyroxine 100 MCG tablet  Commonly known as:  SYNTHROID, LEVOTHROID  Take 100 mcg by mouth daily.     pantoprazole 40 MG tablet  Commonly known as:  PROTONIX  Take 1 tablet (40 mg total) by mouth daily. Take for 6 weeks post ablation then discontinue        Disposition:  Discharge Instructions    Diet - low sodium heart healthy    Complete by:  As directed      Discharge instructions    Complete by:  As directed   No driving for 4 days. No lifting over 5 lbs for 1 week. No sexual activity for 1 week. You may return to work in 1 week. Keep procedure site clean & dry. If you notice increased pain, swelling, bleeding or pus, call/return!  You may shower, but no soaking baths/hot tubs/pools for 1 week.     Discharge instructions    Complete by:  As directed   Return to work on May 1st, 2017.     Increase activity slowly    Complete by:  As directed           Follow-up Information    Follow up with [facility] On 10/09/2015.   Specialty:  Cardiology   Why:  at 8:30AM    Contact information:   [address] [id number] [place] [phone]      Follow up with [name], MD On  12/14/2015.   Specialty:  Cardiology   Why:  at 10:45AM    Contact information:   [place] [address] [phone]       Duration of Discharge Encounter: Greater than 30 minutes including physician time.  Signed, Chanetta Marshall, NP 09/09/2015 6:50 AM  I have seen, examined the patient, and reviewed the above assessment and plan.  Moderate hematoma on exam. No bruit.   Changes to above are made where necessary.    Co Sign: [name], MD 09/09/2015 6:51 AM

## 2015-09-08 NOTE — Transfer of Care (Signed)
Immediate Anesthesia Transfer of Care Note  Patient: Monique Kelly  Procedure(s) Performed: Procedure(s): Atrial Fibrillation Ablation (N/A)  Patient Location: Cath Lab  Anesthesia Type:General  Level of Consciousness: awake, alert  and oriented  Airway & Oxygen Therapy: Patient Spontanous Breathing and Patient connected to face mask oxygen  Post-op Assessment: Report given to RN, Post -op Vital signs reviewed and stable and Patient moving all extremities  Post vital signs: Reviewed and stable  Last Vitals:  Filed Vitals:   09/08/15 0546  BP: 146/87  Pulse: 71  Temp: 36.7 C  Resp: 18    Complications: No apparent anesthesia complications

## 2015-09-08 NOTE — H&P (View-Only) (Signed)
Electrophysiology Office Note   Date:  08/10/2015   ID:  Kandy, Steuber [birth year], MRN [id number]  PCP:  Geanie Cooley., MD  Cardiologist:  Dr Burt Knack Primary Electrophysiologist: [name], MD    Chief Complaint  Patient presents with  . New Patient (Initial Visit)    AFIB     History of Present Illness: TODD GAMBEL is a 60 y.o. female who presents today for electrophysiology evaluation.   The patient reports having symptoms of palpitations, chest discomfort,and SOB for two weeks in December.  She presented to [facility] 04/28/15 and was diagnosed with atrial fibrillation.  She was placed on eliquis, diltiazem, and flecainide 50mg  bid.  She continued to have episodes of afib and her flecainide was increased to 75mg  BID.  Due to QT prolongation, her flecainide was reduced to 50mg  BID.  She was given cardizem to take prn.  She reports that with an AF episode, she took the cardizem and had brief syncope while in bed.  The following day, she went to urgent care and was sent to the ER for AF with RVR.  She was given IV diltiazem and monitored.  She has not tried other AADs.   Today, she denies symptoms of palpitations, chest pain, shortness of breath, orthopnea, PND, lower extremity edema, claudication, dizziness, presyncope, syncope, bleeding, or neurologic sequela. The patient is tolerating medications without difficulties and is otherwise without complaint today.    Past Medical History  Diagnosis Date  . Hypothyroidism (acquired) 2006    s/p thyroidectomy for nodules  . Hyperlipidemia   . Obesity     gastric bpg 2011, weight then 400 lbs, lost 100 lbs in 60 days.  . Atrial fibrillation with rapid ventricular response ([place]) 04/2015  . Obstructive sleep apnea     does not use cpap, does not have a following physician   Past Surgical History  Procedure Laterality Date  . Thyroidectomy  2006    for nodules  . Cholecystectomy    . Vaginal hysterectomy    .  Appendectomy    . Gastric bypass  2011  . Esophagogastroduodenoscopy    . Colonoscopy       Current Outpatient Prescriptions  Medication Sig Dispense Refill  . apixaban (ELIQUIS) 5 MG TABS tablet Take 1 tablet (5 mg total) by mouth 2 (two) times daily. 60 tablet 11  . buPROPion (WELLBUTRIN XL) 150 MG 24 hr tablet Take 150 mg by mouth daily. Pt is taking 450mg  daily.    Marland Kitchen diltiazem (CARDIZEM CD) 120 MG 24 hr capsule Take 1 capsule (120 mg total) by mouth daily. 30 capsule 11  . diltiazem (CARDIZEM) 30 MG tablet Cardizem 30mg  -- take 1 tablet by mouth every 4 hours AS NEEDED for heart rate >100 as long as blood pressure >100. 45 tablet 1  . flecainide (TAMBOCOR) 50 MG tablet Take 50 mg by mouth 2 (two) times daily.    Marland Kitchen ibuprofen (ADVIL,MOTRIN) 800 MG tablet Take 800 mg by mouth every 8 (eight) hours as needed.    Marland Kitchen levothyroxine (SYNTHROID, LEVOTHROID) 100 MCG tablet Take 100 mcg by mouth daily.    . Multiple Vitamin (MULTIVITAMIN WITH MINERALS) TABS tablet Take 1 tablet by mouth daily.     No current facility-administered medications for this visit.    Allergies:   Review of patient's allergies indicates no known allergies.   Social History:  The patient  reports that she has never smoked. She does not have any smokeless  tobacco history on file. She reports that she does not drink alcohol or use illicit drugs.   Family History:  The patient's  family history includes Heart failure in her father, maternal grandfather, and maternal grandmother.    ROS:  Please see the history of present illness.   All other systems are reviewed and negative.    PHYSICAL EXAM: VS:  BP 126/92 mmHg  Pulse 77  Ht 5\' 7"  (1.702 m)  Wt 250 lb 9.6 oz (113.671 kg)  BMI 39.24 kg/m2 , BMI Body mass index is 39.24 kg/(m^2). GEN: overweight, in no acute distress HEENT: normal Neck: no JVD, carotid bruits, or masses Cardiac: RRR; no murmurs, rubs, or gallops,no edema  Respiratory:  clear to auscultation  bilaterally, normal work of breathing GI: soft, nontender, nondistended, + BS MS: no deformity or atrophy Skin: warm and dry  Neuro:  Strength and sensation are intact Psych: euthymic mood, full affect  EKG:  EKG is ordered today. The ekg ordered today shows sinus rhythm 77 bpm, poor R wave progression, Qtc 450 msec.   Recent Labs: 04/28/2015: ALT 18; BUN 14; Creatinine, Ser 0.91; Potassium 3.8; Sodium 142; TSH 2.192 04/29/2015: Hemoglobin 13.1; Platelets 238    Wt Readings from Last 3 Encounters:  08/10/15 250 lb 9.6 oz (113.671 kg)  07/20/15 250 lb 9.6 oz (113.671 kg)  07/06/15 247 lb 9.6 oz (112.311 kg)      Other studies Reviewed: Additional studies/ records that were reviewed today include: AF clinic notes,  Prior ekgs, echo Review of the above records today demonstrates: EF normal, mild MR, LA 46 mm   ASSESSMENT AND PLAN:  1.  Paroxysmal atrial fibrillation The patient has symptomatic atrial fibrillation.  She has failed medical therapy with diltiazem and flecainide.  QT prolongation with flecainide is worrisome.  Therapeutic strategies for afib including medicine (multaq or amiodarone) and ablation were discussed in detail with the patient today. Risk, benefits, and alternatives to EP study and radiofrequency ablation for afib were also discussed in detail today. These risks include but are not limited to stroke, bleeding, vascular damage, tamponade, perforation, damage to the esophagus, lungs, and other structures, pulmonary vein stenosis, worsening renal function, and death. The patient understands these risk and wishes to proceed.  We will therefore proceed with catheter ablation at the next available time.  2. Obesity Weight reduction encouraged Regular exercise encouraged I have reviewed the patients BMI and decreased success rates with ablation at length today.  Weight loss is strongly advised.  Per Guijian et al (PACE 2013; [id number]), patients with BMI 25-29.9  (obese) have a 27% increase in AF recurrence post ablation.  Patients with BMI >30 have a 31% increase in AF recurrence post ablation when compared to those with BMI <25.  3. OSA Not tolerant of CPAP previously Will refer to Dr Maxwell Caul for sleep apnea management  Current medicines are reviewed at length with the patient today.   The patient does not have concerns regarding her medicines.  The following changes were made today:  none    Signed, [name], MD  08/10/2015 9:27 AM     [facility] [address] [place] [address] [phone] (office) [phone] (fax)

## 2015-09-08 NOTE — Progress Notes (Signed)
Extended hold time necessary.  40 minutes direct pressure resulted in hemostasis.  Bedrest starting at 1230 x 6 hrs.  Pt tolerated hold very well. Rt DP palpatable and sterile DSG applied to site.

## 2015-09-08 NOTE — Anesthesia Procedure Notes (Signed)
Procedure Name: LMA Insertion Date/Time: 09/08/2015 8:13 AM Performed by: Rush Farmer E Pre-anesthesia Checklist: Patient identified, Emergency Drugs available, Suction available, Patient being monitored and Timeout performed Patient Re-evaluated:Patient Re-evaluated prior to inductionOxygen Delivery Method: Circle system utilized Preoxygenation: Pre-oxygenation with 100% oxygen Intubation Type: IV induction LMA: LMA inserted LMA Size: 4.0 Number of attempts: 1 Placement Confirmation: positive ETCO2 and breath sounds checked- equal and bilateral Tube secured with: Tape Dental Injury: Teeth and Oropharynx as per pre-operative assessment

## 2015-09-08 NOTE — Anesthesia Postprocedure Evaluation (Signed)
Anesthesia Post Note  Patient: Monique Kelly  Procedure(s) Performed: Procedure(s) (LRB): Atrial Fibrillation Ablation (N/A)  Patient location during evaluation: Cath Lab Anesthesia Type: General Level of consciousness: awake and alert Pain management: pain level controlled Vital Signs Assessment: post-procedure vital signs reviewed and stable Respiratory status: spontaneous breathing, nonlabored ventilation, respiratory function stable and patient connected to nasal cannula oxygen Cardiovascular status: blood pressure returned to baseline and stable Postop Assessment: no signs of nausea or vomiting Anesthetic complications: no    Last Vitals:  Filed Vitals:   09/08/15 0546 09/08/15 1140  BP: 146/87 112/67  Pulse: 71 66  Temp: 36.7 C   Resp: 18     Last Pain: There were no vitals filed for this visit.               Effie Berkshire

## 2015-09-08 NOTE — Interval H&P Note (Signed)
History and Physical Interval Note:  09/08/2015 7:25 AM  Monique Kelly  has presented today for surgery, with the diagnosis of afib  The various methods of treatment have been discussed with the patient and family. After consideration of risks, benefits and other options for treatment, the patient has consented to  Procedure(s): Atrial Fibrillation Ablation (N/A) as a surgical intervention .  The patient's history has been reviewed, patient examined, no change in status, stable for surgery.  I have reviewed the patient's chart and labs.  Questions were answered to the patient's satisfaction.     [name]

## 2015-09-09 ENCOUNTER — Encounter (HOSPITAL_COMMUNITY): Payer: Self-pay | Admitting: Internal Medicine

## 2015-09-09 ENCOUNTER — Inpatient Hospital Stay (HOSPITAL_COMMUNITY)
Admission: RE | Admit: 2015-09-09 | Payer: Managed Care, Other (non HMO) | Source: Ambulatory Visit | Admitting: Nurse Practitioner

## 2015-09-09 DIAGNOSIS — I480 Paroxysmal atrial fibrillation: Secondary | ICD-10-CM

## 2015-09-09 MED ORDER — PANTOPRAZOLE SODIUM 40 MG PO TBEC: 40.0000 mg | DELAYED_RELEASE_TABLET | Freq: Every day | ORAL | Status: DC

## 2015-09-09 NOTE — Progress Notes (Signed)
Pt c/o cp, non radiating, 3/10, asymptomatic, vs and pt stale, ekg done, md paged, will continue to monitor closely.

## 2015-09-09 NOTE — Progress Notes (Signed)
Oxygen increased from 2 to 4 liters at time of complaint, 1 norco 5/325 given at 0548, will continue to monitor pt closely

## 2015-09-09 NOTE — Care Management Note (Signed)
Case Management Note  Patient Details  Name: Monique Kelly MRN: [id number] Date of Birth: [birth year]  Subjective/Objective:    Patient is from home, will be on Eliquis which she was already on.  Patient for dc today, no other needs.                Action/Plan:   Expected Discharge Date:                  Expected Discharge Plan:  Home/Self Care  In-House Referral:     Discharge planning Services  CM Consult  Post Acute Care Choice:    Choice offered to:     DME Arranged:    DME Agency:     HH Arranged:    [place] Agency:     Status of Service:  Completed, signed off  Medicare Important Message Given:    Date Medicare IM Given:    Medicare IM give by:    Date Additional Medicare IM Given:    Additional Medicare Important Message give by:     If discussed at [place] of Stay Meetings, dates discussed:    Additional Comments:  Zenon Mayo, RN 09/09/2015, 11:14 AM

## 2015-09-09 NOTE — Progress Notes (Signed)
Chest pain resolved with 1 vicodin, no further complaints.  Will continue to monitor closely.

## 2015-09-11 ENCOUNTER — Ambulatory Visit (HOSPITAL_COMMUNITY): Payer: Managed Care, Other (non HMO) | Admitting: Nurse Practitioner

## 2015-09-17 ENCOUNTER — Telehealth: Payer: Self-pay | Admitting: Nurse Practitioner

## 2015-09-17 NOTE — Telephone Encounter (Signed)
Patient called in asking about her FMLA papers. I explained to her these papers needed to be completed by Monique Kelly in [facility]  I have faxed these to this clinic. I also made her aware of the 10/14 day period this may take. Patient was ok and understood  she will follow up  With CHF.

## 2015-10-09 ENCOUNTER — Ambulatory Visit (HOSPITAL_COMMUNITY)
Admission: RE | Admit: 2015-10-09 | Discharge: 2015-10-09 | Disposition: A | Discharge status: Home / Self Care | Payer: Managed Care, Other (non HMO) | Source: Ambulatory Visit | Attending: Nurse Practitioner | Admitting: Nurse Practitioner

## 2015-10-09 ENCOUNTER — Encounter (HOSPITAL_COMMUNITY): Payer: Self-pay | Admitting: Nurse Practitioner

## 2015-10-09 VITALS — BP 104/60 | HR 90 | Ht 67.0 in | Wt 245.8 lb

## 2015-10-09 DIAGNOSIS — Z8679 Personal history of other diseases of the circulatory system: Secondary | ICD-10-CM

## 2015-10-09 DIAGNOSIS — I480 Paroxysmal atrial fibrillation: Secondary | ICD-10-CM | POA: Insufficient documentation

## 2015-10-09 DIAGNOSIS — E039 Hypothyroidism, unspecified: Secondary | ICD-10-CM | POA: Insufficient documentation

## 2015-10-09 DIAGNOSIS — Z9889 Other specified postprocedural states: Secondary | ICD-10-CM | POA: Diagnosis not present

## 2015-10-09 DIAGNOSIS — E785 Hyperlipidemia, unspecified: Secondary | ICD-10-CM | POA: Insufficient documentation

## 2015-10-09 DIAGNOSIS — Z7901 Long term (current) use of anticoagulants: Secondary | ICD-10-CM | POA: Insufficient documentation

## 2015-10-09 DIAGNOSIS — Z9049 Acquired absence of other specified parts of digestive tract: Secondary | ICD-10-CM | POA: Diagnosis not present

## 2015-10-09 DIAGNOSIS — Z90710 Acquired absence of both cervix and uterus: Secondary | ICD-10-CM | POA: Insufficient documentation

## 2015-10-09 DIAGNOSIS — R000 Tachycardia, unspecified: Secondary | ICD-10-CM | POA: Insufficient documentation

## 2015-10-09 DIAGNOSIS — E669 Obesity, unspecified: Secondary | ICD-10-CM | POA: Diagnosis not present

## 2015-10-09 DIAGNOSIS — G4733 Obstructive sleep apnea (adult) (pediatric): Secondary | ICD-10-CM | POA: Insufficient documentation

## 2015-10-09 DIAGNOSIS — I4891 Unspecified atrial fibrillation: Secondary | ICD-10-CM | POA: Diagnosis present

## 2015-10-09 DIAGNOSIS — Z9884 Bariatric surgery status: Secondary | ICD-10-CM | POA: Diagnosis not present

## 2015-10-09 NOTE — Patient Instructions (Signed)
Stop flecainide 

## 2015-10-09 NOTE — Progress Notes (Addendum)
Patient ID: Monique Kelly, female   DOB: January 20, 1956, 60 y.o.   MRN: [id number]     Primary Care Physician: Geanie Cooley., MD Referring Physician: Dr. Sheliah Hatch is a 60 y.o. female with a h/o afib s/p ablation 09/08/15. She has not had any afib since the procedure. She denies any groin issues or dysphagia  since the procedure. Continues on flecainide and diltiazem. Mild shortness of breath since procedure but getting better weekly.  Today, she denies symptoms of palpitations, chest pain, shortness of breath, orthopnea, PND, lower extremity edema, dizziness, presyncope, syncope, or neurologic sequela. The patient is tolerating medications without difficulties and is otherwise without complaint today.   Past Medical History  Diagnosis Date  . Hypothyroidism (acquired) 2006    s/p thyroidectomy for nodules  . Hyperlipidemia   . Obesity     gastric bpg 2011, weight then 400 lbs, lost 100 lbs in 60 days.  . Atrial fibrillation with rapid ventricular response ([place]) 04/2015  . Obstructive sleep apnea     does not use cpap, does not have a following physician   Past Surgical History  Procedure Laterality Date  . Thyroidectomy  2006    for nodules  . Cholecystectomy    . Vaginal hysterectomy    . Appendectomy    . Gastric bypass  2011  . Esophagogastroduodenoscopy    . Colonoscopy    . Electrophysiologic study N/A 09/08/2015    Procedure: Atrial Fibrillation Ablation;  Surgeon: [name], MD;  Location: [facility];  Service: Cardiovascular;  Laterality: N/A;    Current Outpatient Prescriptions  Medication Sig Dispense Refill  . acetaminophen (TYLENOL) 500 MG tablet Take 1,000 mg by mouth every 8 (eight) hours as needed.    Marland Kitchen apixaban (ELIQUIS) 5 MG TABS tablet Take 1 tablet (5 mg total) by mouth 2 (two) times daily. 60 tablet 11  . diltiazem (CARDIZEM CD) 120 MG 24 hr capsule Take 120 mg by mouth daily.    Marland Kitchen diltiazem (CARDIZEM) 30 MG tablet Cardizem 30mg  --  take 1 tablet by mouth every 4 hours AS NEEDED for heart rate >100 as long as blood pressure >100. 45 tablet 1  . levothyroxine (SYNTHROID, LEVOTHROID) 100 MCG tablet Take 100 mcg by mouth daily.    Marland Kitchen ibuprofen (ADVIL,MOTRIN) 800 MG tablet Take 800 mg by mouth every 8 (eight) hours as needed.     No current facility-administered medications for this encounter.    No Known Allergies  Social History   Social History  . Marital Status: Single    Spouse Name: N/A  . Number of Children: N/A  . 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 Smoking status: Never Smoker   . Smokeless tobacco: Not on file  . Alcohol Use: No     Comment: rare glass of wine  . Drug Use: No  . Sexual Activity: Not on file   Other Topics Concern  . Not on file   Social History Narrative   Pt lives in [place] alone. Works as Technical brewer for [employer].    Family History  Problem Relation Age of Onset  . Heart failure Father   . Heart failure Maternal Grandmother   . Heart failure Maternal Grandfather     ROS- All systems are reviewed and negative except as per the HPI above  Physical Exam: Filed Vitals:   10/09/15 0848  BP: 104/60  Pulse: 90  Height: 5\' 7"  (1.702 m)  Weight: 245 lb 12.8 oz (111.494 kg)    GEN- The patient is well appearing, alert and oriented x 3 today.   Head- normocephalic, atraumatic Eyes-  Sclera clear, conjunctiva pink Ears- hearing intact Oropharynx- clear Neck- supple, no JVP Lymph- no cervical lymphadenopathy Lungs- Clear to ausculation bilaterally, normal work of breathing Heart- Regular rate and rhythm, no murmurs, rubs or gallops, PMI not laterally displaced GI- soft, NT, ND, + BS Extremities- no clubbing, cyanosis, or edema MS- no significant deformity or atrophy Skin- no rash or lesion Psych- euthymic mood, full affect Neuro- strength and sensation are intact  EKG-NSR at 90 bpm,  pr int 174 ms, qrs int 86 ms, qtc 408 ms Epic records reviewed  Assessment and Plan: 1. PAF In SR since ablation Stop flecainide, continue diltiazem, for now, consideration for stopping at 3 month f/u Continue apixaban, ibuprofen 800 mg as needed on drug list, but she is aware of interactions with anticoagulant and doesn't take.   F/u with Dr. Rayann Heman as scheduled 7/24 afib clinic if needed  Butch Penny C. Diaz Crago, [facility] [address] [place], [place] [address] [phone]

## 2015-11-18 ENCOUNTER — Telehealth (HOSPITAL_COMMUNITY): Payer: Self-pay | Admitting: *Deleted

## 2015-11-18 ENCOUNTER — Encounter (HOSPITAL_COMMUNITY): Payer: Self-pay | Admitting: Nurse Practitioner

## 2015-11-18 NOTE — Telephone Encounter (Signed)
The A-Fib came back very strongly on Sunday 25th. I am in the third day now. I have gone back to taking the flecnide and the (rescue pill 1 so far) with no results or easing of symptoms.   Patient sent above email via mychart---called patient states she restarted her flecainide at 50mg  bid back on Monday and has taken one of the PRN cardizem.  Is starting to feel better now and thinks she may be trying to go back into rhythm. Offered patient appt today but patient states she wanted to wait and see if it went back into NSR on her own. Instructed patient she can use the PRN cardizem every 4 hours as long as her HR is greater than 100. Patient will call back if further issues.

## 2015-11-24 ENCOUNTER — Encounter (HOSPITAL_COMMUNITY): Payer: Self-pay | Admitting: Nurse Practitioner

## 2015-11-27 ENCOUNTER — Ambulatory Visit (HOSPITAL_COMMUNITY)
Admission: RE | Admit: 2015-11-27 | Discharge: 2015-11-27 | Disposition: A | Discharge status: Home / Self Care | Payer: Managed Care, Other (non HMO) | Source: Ambulatory Visit | Attending: Nurse Practitioner | Admitting: Nurse Practitioner

## 2015-11-27 ENCOUNTER — Other Ambulatory Visit: Payer: Self-pay

## 2015-11-27 ENCOUNTER — Encounter (HOSPITAL_COMMUNITY): Payer: Self-pay | Admitting: Nurse Practitioner

## 2015-11-27 VITALS — BP 114/74 | HR 92 | Ht 67.0 in | Wt 246.4 lb

## 2015-11-27 DIAGNOSIS — E890 Postprocedural hypothyroidism: Secondary | ICD-10-CM | POA: Diagnosis not present

## 2015-11-27 DIAGNOSIS — Z7901 Long term (current) use of anticoagulants: Secondary | ICD-10-CM | POA: Insufficient documentation

## 2015-11-27 DIAGNOSIS — Z8679 Personal history of other diseases of the circulatory system: Secondary | ICD-10-CM

## 2015-11-27 DIAGNOSIS — E785 Hyperlipidemia, unspecified: Secondary | ICD-10-CM | POA: Insufficient documentation

## 2015-11-27 DIAGNOSIS — I480 Paroxysmal atrial fibrillation: Secondary | ICD-10-CM | POA: Insufficient documentation

## 2015-11-27 DIAGNOSIS — Z79899 Other long term (current) drug therapy: Secondary | ICD-10-CM | POA: Insufficient documentation

## 2015-11-27 DIAGNOSIS — Z9889 Other specified postprocedural states: Secondary | ICD-10-CM | POA: Diagnosis not present

## 2015-11-27 DIAGNOSIS — G4733 Obstructive sleep apnea (adult) (pediatric): Secondary | ICD-10-CM | POA: Diagnosis not present

## 2015-11-27 NOTE — Progress Notes (Signed)
Patient ID: Monique Kelly, female   DOB: 03-03-1956, 60 y.o.   MRN: [id number]     Primary Care Physician: Geanie Cooley., MD Referring Physician: Dr. Sheliah Hatch is a 60 y.o. female with a h/o afib s/p ablation 09/08/15. She has not had any afib since the procedure. She denies any groin issues or dysphagia  since the procedure. Continues on flecainide and diltiazem. Mild shortness of breath since procedure but getting better weekly. Flecainide was stopped due to no afib since procedure.  Returns to afib clinic 7/7, asked to be seen for some symptoms she was concerned about. She was helping her mother move a table, 7/4. She tried twice to pick up the table and had dizziness on lifting the table to the point that she had to hold on to something to keep from falling. Each episode lasted a few minutes. No further dizzy spells. Since then she has noticed afib episodes lasting mins to hours. She restarted flecainide 50 mg bid(in the past did not tolerate higher dose due to qtc prolongation)  and uses 30 mg diltiazem prn. Continues to have some episodes but does not feel that is is as rapid as before the ablation. It is associated with chest pressure and shortness of breath.   Today, she denies symptoms of palpitations, chest pain, shortness of breath, orthopnea, PND, lower extremity edema, dizziness, presyncope, syncope, or neurologic sequela. The patient is tolerating medications without difficulties and is otherwise without complaint today.   Past Medical History  Diagnosis Date  . Hypothyroidism (acquired) 2006    s/p thyroidectomy for nodules  . Hyperlipidemia   . Obesity     gastric bpg 2011, weight then 400 lbs, lost 100 lbs in 60 days.  . Atrial fibrillation with rapid ventricular response ([place]) 04/2015  . Obstructive sleep apnea     does not use cpap, does not have a following physician   Past Surgical History  Procedure Laterality Date  . Thyroidectomy  2006    for nodules    . Cholecystectomy    . Vaginal hysterectomy    . Appendectomy    . Gastric bypass  2011  . Esophagogastroduodenoscopy    . Colonoscopy    . Electrophysiologic study N/A 09/08/2015    Procedure: Atrial Fibrillation Ablation;  Surgeon: [name], MD;  Location: [facility];  Service: Cardiovascular;  Laterality: N/A;    Current Outpatient Prescriptions  Medication Sig Dispense Refill  . acetaminophen (TYLENOL) 500 MG tablet Take 1,000 mg by mouth every 8 (eight) hours as needed.    Marland Kitchen apixaban (ELIQUIS) 5 MG TABS tablet Take 1 tablet (5 mg total) by mouth 2 (two) times daily. 60 tablet 11  . diltiazem (CARDIZEM CD) 120 MG 24 hr capsule Take 120 mg by mouth daily. Reported on 11/27/2015    . diltiazem (CARDIZEM) 30 MG tablet Cardizem 30mg  -- take 1 tablet by mouth every 4 hours AS NEEDED for heart rate >100 as long as blood pressure >100. 45 tablet 1  . ibuprofen (ADVIL,MOTRIN) 800 MG tablet Take 800 mg by mouth every 8 (eight) hours as needed.    Marland Kitchen levothyroxine (SYNTHROID, LEVOTHROID) 100 MCG tablet Take 100 mcg by mouth daily.    . flecainide (TAMBOCOR) 50 MG tablet Take 50 mg by mouth 2 (two) times daily.     No current facility-administered medications for this encounter.    No Known Allergies  Social History   Social History  . Marital  Status: Single    Spouse Name: N/A  . Number of Children: N/A  . 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 Smoking status: Never Smoker   . Smokeless tobacco: Not on file  . Alcohol Use: No     Comment: rare glass of wine  . Drug Use: No  . Sexual Activity: Not on file   Other Topics Concern  . Not on file   Social History Narrative   Pt lives in [place] alone. Works as Technical brewer for [employer].    Family History  Problem Relation Age of Onset  . Heart failure Father   . Heart failure Maternal Grandmother   . Heart failure Maternal Grandfather      ROS- All systems are reviewed and negative except as per the HPI above  Physical Exam: Filed Vitals:   11/27/15 0858  BP: 114/74  Pulse: 92  Height: 5\' 7"  (1.702 m)  Weight: 246 lb 6.4 oz (111.766 kg)    GEN- The patient is well appearing, alert and oriented x 3 today.   Head- normocephalic, atraumatic Eyes-  Sclera clear, conjunctiva pink Ears- hearing intact Oropharynx- clear Neck- supple, no JVP Lymph- no cervical lymphadenopathy Lungs- Clear to ausculation bilaterally, normal work of breathing Heart- Regular rate and rhythm, no murmurs, rubs or gallops, PMI not laterally displaced GI- soft, NT, ND, + BS Extremities- no clubbing, cyanosis, or edema MS- no significant deformity or atrophy Skin- no rash or lesion Psych- euthymic mood, full affect Neuro- strength and sensation are intact  EKG- SR with PAC's  at 92 bpm, pr int 178 ms, qrs int 100 ms, qtc 499 ms Epic records reviewed  Assessment and Plan: 1. PAF, s/p ablation 4/18 Did well without any afib x one month, stopped flecainide and then started having episodes, has returned to flecainide but still having episodes of irregular heart beat associated with chest pressure, shortness of breath. Low risk stress test Jan 2017.  Continue flecainide 50 mg bid, continue diltiazem  Continue apixaban  Discussed with pt that she may be a good Lin[facility] candidate. She is interested and will discuss further with Dr. Rayann Heman on f/u 7/24.  F/u with Dr. Rayann Heman as scheduled 7/24 afib clinic if needed  Butch Penny C. Samadhi Mahurin, [facility] [address] [place], [place] [address] [phone]

## 2015-11-27 NOTE — Addendum Note (Signed)
Encounter addended by: Sherran Needs, NP on: 11/27/2015 10:32 AM<BR>     Documentation filed: Follow-up Section, LOS Section

## 2015-12-11 ENCOUNTER — Other Ambulatory Visit: Payer: Self-pay

## 2015-12-14 ENCOUNTER — Encounter: Payer: Self-pay | Admitting: Internal Medicine

## 2015-12-14 ENCOUNTER — Encounter: Payer: Self-pay | Admitting: *Deleted

## 2015-12-14 ENCOUNTER — Ambulatory Visit (INDEPENDENT_AMBULATORY_CARE_PROVIDER_SITE_OTHER): Payer: Managed Care, Other (non HMO) | Admitting: Internal Medicine

## 2015-12-14 VITALS — BP 112/78 | HR 76 | Ht 67.0 in | Wt 251.0 lb

## 2015-12-14 DIAGNOSIS — G4733 Obstructive sleep apnea (adult) (pediatric): Secondary | ICD-10-CM

## 2015-12-14 DIAGNOSIS — I480 Paroxysmal atrial fibrillation: Secondary | ICD-10-CM

## 2015-12-14 NOTE — Progress Notes (Signed)
PCP: [facility] T., MD Primary Cardiologist:  Dr Louis Meckel is a 60 y.o. female who presents today for routine electrophysiology followup.  Since her afib ablation she has done well.  Denies procedure related complications.  She has had some palpitations and is not certain as to whether or not this is afib (refer to AF clinic note).  She has restarted flecainide after doing well off of it for several weeks.  Today, she denies symptoms of palpitations, chest pain, shortness of breath,  lower extremity edema, presyncope, or syncope.  The patient is otherwise without complaint today.   Past Medical History:  Diagnosis Date  . Atrial fibrillation with rapid ventricular response ([place]) 04/2015  . Hyperlipidemia   . Hypothyroidism (acquired) 2006   s/p thyroidectomy for nodules  . Obesity    gastric bpg 2011, weight then 400 lbs, lost 100 lbs in 60 days.  . Obstructive sleep apnea    does not use cpap, does not have a following physician   Past Surgical History:  Procedure Laterality Date  . APPENDECTOMY    . CHOLECYSTECTOMY    . COLONOSCOPY    . ELECTROPHYSIOLOGIC STUDY N/A 09/08/2015   Procedure: Atrial Fibrillation Ablation;  Surgeon: [name], MD;  Location: [place] [facility];  Service: Cardiovascular;  Laterality: N/A;  . ESOPHAGOGASTRODUODENOSCOPY    . GASTRIC BYPASS  2011  . THYROIDECTOMY  2006   for nodules  . VAGINAL HYSTERECTOMY      ROS- all systems are reviewed and negatives except as per HPI above  Current Outpatient Prescriptions  Medication Sig Dispense Refill  . acetaminophen (TYLENOL) 500 MG tablet Take 1,000 mg by mouth every 8 (eight) hours as needed (pain).     Marland Kitchen apixaban (ELIQUIS) 5 MG TABS tablet Take 1 tablet (5 mg total) by mouth 2 (two) times daily. 60 tablet 11  . buPROPion (WELLBUTRIN XL) 150 MG 24 hr tablet Take 3 tablets by mouth daily.    Marland Kitchen diltiazem (CARDIZEM CD) 120 MG 24 hr capsule Take 120 mg by mouth daily. Reported on 11/27/2015    .  diltiazem (CARDIZEM) 30 MG tablet Cardizem 30mg  -- take 1 tablet by mouth every 4 hours AS NEEDED for heart rate >100 as long as blood pressure >100. 45 tablet 1  . flecainide (TAMBOCOR) 50 MG tablet Take 50 mg by mouth 2 (two) times daily.    Marland Kitchen gabapentin (NEURONTIN) 300 MG capsule Take 1 capsule by mouth 2 (two) times daily.    . hydrOXYzine (ATARAX/VISTARIL) 25 MG tablet Take 3 tablets by mouth at bedtime.    Marland Kitchen ibuprofen (ADVIL,MOTRIN) 800 MG tablet Take 800 mg by mouth every 8 (eight) hours as needed (pain).     Marland Kitchen levothyroxine (SYNTHROID, LEVOTHROID) 100 MCG tablet Take 100 mcg by mouth daily.    . Vitamin D, Ergocalciferol, (DRISDOL) 50000 units CAPS capsule Take 1 capsule by mouth once a week.    . zolpidem (AMBIEN) 5 MG tablet Take 1 tablet by mouth at bedtime.     No current facility-administered medications for this visit.     Physical Exam: Vitals:   12/14/15 1057  BP: 112/78  Pulse: 76  Weight: 251 lb (113.9 kg)  Height: 5\' 7"  (1.702 m)    GEN- The patient is well appearing, alert and oriented x 3 today.   Head- normocephalic, atraumatic Eyes-  Sclera clear, conjunctiva pink Ears- hearing intact Oropharynx- clear Lungs- Clear to ausculation bilaterally, normal work of breathing Heart- Regular  rate and rhythm, no murmurs, rubs or gallops, PMI not laterally displaced GI- soft, NT, ND, + BS Extremities- no clubbing, cyanosis, or edema  ekg today reveals sinus rhythm  Assessment and Plan:  1. afib Doing well s/p ablation She has palpitations of unclear etiology.  I feel that implantation of an implantable loop recorder would be helpful to better characterize her arrhythmia/ palpitations post afib ablation.  Risks of procedure discussed with the patient who wishes to proceed. chads2vasc score is 1.  Stop eliquis at this time  2. Overweight Lifestyle modification discussed at length  3. OSA She is now wearing her CPAP.  Return to see me 4 weeks post ILR  placement  [name] MD, [facility] 12/14/2015 11:18 AM

## 2015-12-14 NOTE — Patient Instructions (Signed)
Medication Instructions:  Your physician has recommended you make the following change in your medication:  1) Stop Eliquis   Labwork: None ordered   Testing/Procedures: LINQ implant on Thurs 12/17/15 Please report to [facility] [place]: Your physician recommends that you schedule a follow-up appointment in: 4 weeks with Dr Rayann Heman   Any Other Special Instructions Will Be Listed Below (If Applicable).     If you need a refill on your cardiac medications before your next appointment, please call your pharmacy.

## 2015-12-17 ENCOUNTER — Ambulatory Visit (HOSPITAL_COMMUNITY)
Admission: RE | Admit: 2015-12-17 | Discharge: 2015-12-17 | Disposition: A | Discharge status: Home / Self Care | Payer: Managed Care, Other (non HMO) | Source: Ambulatory Visit | Attending: Internal Medicine | Admitting: Internal Medicine

## 2015-12-17 ENCOUNTER — Encounter (HOSPITAL_COMMUNITY): Payer: Self-pay | Admitting: *Deleted

## 2015-12-17 ENCOUNTER — Encounter (HOSPITAL_COMMUNITY)
Admission: RE | Disposition: A | Discharge status: Home / Self Care | Payer: Self-pay | Source: Ambulatory Visit | Attending: Internal Medicine

## 2015-12-17 DIAGNOSIS — G4733 Obstructive sleep apnea (adult) (pediatric): Secondary | ICD-10-CM | POA: Diagnosis not present

## 2015-12-17 DIAGNOSIS — R002 Palpitations: Secondary | ICD-10-CM | POA: Insufficient documentation

## 2015-12-17 DIAGNOSIS — I4891 Unspecified atrial fibrillation: Secondary | ICD-10-CM | POA: Diagnosis not present

## 2015-12-17 DIAGNOSIS — E669 Obesity, unspecified: Secondary | ICD-10-CM | POA: Insufficient documentation

## 2015-12-17 DIAGNOSIS — I480 Paroxysmal atrial fibrillation: Secondary | ICD-10-CM | POA: Diagnosis not present

## 2015-12-17 DIAGNOSIS — Z9884 Bariatric surgery status: Secondary | ICD-10-CM | POA: Diagnosis not present

## 2015-12-17 DIAGNOSIS — E890 Postprocedural hypothyroidism: Secondary | ICD-10-CM | POA: Diagnosis not present

## 2015-12-17 DIAGNOSIS — Z7901 Long term (current) use of anticoagulants: Secondary | ICD-10-CM | POA: Insufficient documentation

## 2015-12-17 DIAGNOSIS — E785 Hyperlipidemia, unspecified: Secondary | ICD-10-CM | POA: Diagnosis not present

## 2015-12-17 HISTORY — PX: EP IMPLANTABLE DEVICE: SHX172B

## 2015-12-17 SURGERY — LOOP RECORDER INSERTION
Anesthesia: LOCAL

## 2015-12-17 MED ORDER — LIDOCAINE-EPINEPHRINE 1 %-1:100000 IJ SOLN
INTRAMUSCULAR | Status: DC | PRN
  Administered 2015-12-17: 10 mL via INTRADERMAL

## 2015-12-17 MED ORDER — LIDOCAINE-EPINEPHRINE 1 %-1:100000 IJ SOLN
INTRAMUSCULAR | Status: AC
  Filled 2015-12-17: qty 1

## 2015-12-17 SURGICAL SUPPLY — 2 items
LOOP REVEAL LINQSYS (Prosthesis & Implant Heart) ×2 IMPLANT
PACK LOOP INSERTION (CUSTOM PROCEDURE TRAY) ×2 IMPLANT

## 2015-12-17 NOTE — H&P (View-Only) (Signed)
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 female who presents today for routine electrophysiology followup.  Since her afib ablation she has done well.  Denies procedure related complications.  She has had some palpitations and is not certain as to whether or not this is afib (refer to AF clinic note).  She has restarted flecainide after doing well off of it for several weeks.  Today, she denies symptoms of palpitations, chest pain, shortness of breath,  lower extremity edema, presyncope, or syncope.  The patient is otherwise without complaint today.   Past Medical History:  Diagnosis Date  . Atrial fibrillation with rapid ventricular response ([place]) 04/2015  . Hyperlipidemia   . Hypothyroidism (acquired) 2006   s/p thyroidectomy for nodules  . Obesity    gastric bpg 2011, weight then 400 lbs, lost 100 lbs in 60 days.  . Obstructive sleep apnea    does not use cpap, does not have a following physician   Past Surgical History:  Procedure Laterality Date  . APPENDECTOMY    . CHOLECYSTECTOMY    . COLONOSCOPY    . ELECTROPHYSIOLOGIC STUDY N/A 09/08/2015   Procedure: Atrial Fibrillation Ablation;  Surgeon: [name], MD;  Location: [place] [facility];  Service: Cardiovascular;  Laterality: N/A;  . ESOPHAGOGASTRODUODENOSCOPY    . GASTRIC BYPASS  2011  . THYROIDECTOMY  2006   for nodules  . VAGINAL HYSTERECTOMY      ROS- all systems are reviewed and negatives except as per HPI above  Current Outpatient Prescriptions  Medication Sig Dispense Refill  . acetaminophen (TYLENOL) 500 MG tablet Take 1,000 mg by mouth every 8 (eight) hours as needed (pain).     Marland Kitchen apixaban (ELIQUIS) 5 MG TABS tablet Take 1 tablet (5 mg total) by mouth 2 (two) times daily. 60 tablet 11  . buPROPion (WELLBUTRIN XL) 150 MG 24 hr tablet Take 3 tablets by mouth daily.    Marland Kitchen diltiazem (CARDIZEM CD) 120 MG 24 hr capsule Take 120 mg by mouth daily. Reported on 11/27/2015    .  diltiazem (CARDIZEM) 30 MG tablet Cardizem 30mg  -- take 1 tablet by mouth every 4 hours AS NEEDED for heart rate >100 as long as blood pressure >100. 45 tablet 1  . flecainide (TAMBOCOR) 50 MG tablet Take 50 mg by mouth 2 (two) times daily.    Marland Kitchen gabapentin (NEURONTIN) 300 MG capsule Take 1 capsule by mouth 2 (two) times daily.    . hydrOXYzine (ATARAX/VISTARIL) 25 MG tablet Take 3 tablets by mouth at bedtime.    Marland Kitchen ibuprofen (ADVIL,MOTRIN) 800 MG tablet Take 800 mg by mouth every 8 (eight) hours as needed (pain).     Marland Kitchen levothyroxine (SYNTHROID, LEVOTHROID) 100 MCG tablet Take 100 mcg by mouth daily.    . Vitamin D, Ergocalciferol, (DRISDOL) 50000 units CAPS capsule Take 1 capsule by mouth once a week.    . zolpidem (AMBIEN) 5 MG tablet Take 1 tablet by mouth at bedtime.     No current facility-administered medications for this visit.     Physical Exam: Vitals:   12/14/15 1057  BP: 112/78  Pulse: 76  Weight: 251 lb (113.9 kg)  Height: 5\' 7"  (1.702 m)    GEN- The patient is well appearing, alert and oriented x 3 today.   Head- normocephalic, atraumatic Eyes-  Sclera clear, conjunctiva pink Ears- hearing intact Oropharynx- clear Lungs- Clear to ausculation bilaterally, normal work of breathing Heart- Regular  rate and rhythm, no murmurs, rubs or gallops, PMI not laterally displaced GI- soft, NT, ND, + BS Extremities- no clubbing, cyanosis, or edema  ekg today reveals sinus rhythm  Assessment and Plan:  1. afib Doing well s/p ablation She has palpitations of unclear etiology.  I feel that implantation of an implantable loop recorder would be helpful to better characterize her arrhythmia/ palpitations post afib ablation.  Risks of procedure discussed with the patient who wishes to proceed. chads2vasc score is 1.  Stop eliquis at this time  2. Overweight Lifestyle modification discussed at length  3. OSA She is now wearing her CPAP.  Return to see me 4 weeks post ILR  placement  [name] MD, [facility] 12/14/2015 11:18 AM

## 2015-12-17 NOTE — Progress Notes (Signed)
Pt left without DC instructions, Dr Rayann Heman gave verbal instruction.

## 2015-12-17 NOTE — Interval H&P Note (Signed)
History and Physical Interval Note:  12/17/2015 3:39 PM  Monique Kelly  has presented today for surgery, with the diagnosis of afib  The various methods of treatment have been discussed with the patient and family. After consideration of risks, benefits and other options for treatment, the patient has consented to  Procedure(s): Loop Recorder Insertion (N/A) as a surgical intervention .  The patient's history has been reviewed, patient examined, no change in status, stable for surgery.  I have reviewed the patient's chart and labs.  Questions were answered to the patient's satisfaction.     [name]

## 2015-12-17 NOTE — Discharge Instructions (Signed)
See  Supplemental discharge instruction.

## 2015-12-18 ENCOUNTER — Encounter (HOSPITAL_COMMUNITY): Payer: Self-pay | Admitting: Internal Medicine

## 2015-12-18 ENCOUNTER — Encounter (HOSPITAL_COMMUNITY): Payer: Self-pay | Admitting: Nurse Practitioner

## 2015-12-18 ENCOUNTER — Encounter (HOSPITAL_COMMUNITY): Payer: Self-pay | Admitting: *Deleted

## 2015-12-18 ENCOUNTER — Telehealth (HOSPITAL_COMMUNITY): Payer: Self-pay | Admitting: *Deleted

## 2015-12-18 NOTE — Telephone Encounter (Signed)
Patient came as walk-in to afib clinic this morning complaining of bleeding from linq implant insertion site.  Site is located under left breast. No redness noted. Site covered with steri strips which were intact. New dressing applied and covered with tegaderm. Instructed patient to try and avoid wearing underwire bra until area healed and keep area dry. I did send patient home with extra dressing supplies but instructed her to seek medical attention over weekend if bleeding increased. Patient verbalized understanding.

## 2015-12-24 ENCOUNTER — Encounter: Payer: Self-pay | Admitting: Internal Medicine

## 2015-12-28 ENCOUNTER — Encounter: Payer: Self-pay | Admitting: Internal Medicine

## 2015-12-28 ENCOUNTER — Ambulatory Visit (INDEPENDENT_AMBULATORY_CARE_PROVIDER_SITE_OTHER): Payer: Managed Care, Other (non HMO) | Admitting: *Deleted

## 2015-12-28 DIAGNOSIS — I480 Paroxysmal atrial fibrillation: Secondary | ICD-10-CM

## 2015-12-28 LAB — CUP PACEART INCLINIC DEVICE CHECK: MDC IDC SESS DTM: 20170807171521

## 2015-12-28 NOTE — Progress Notes (Signed)
Wound check in clinic s/p ILR implant. Wound well healed without redness or edema. Incision edges approximated. Normal ILR function Battery status: Good. R-waves 0.100mV. 0 symptom episodes, 0 tachy episodes, 0 pause episodes, 0 brady episodes. 0 AF episodes (0% burden). Monthly summary reports and ROV with JA on 8/24.

## 2015-12-30 ENCOUNTER — Encounter: Payer: Self-pay | Admitting: Internal Medicine

## 2016-01-05 ENCOUNTER — Encounter: Payer: Self-pay | Admitting: *Deleted

## 2016-01-05 ENCOUNTER — Encounter: Payer: Managed Care, Other (non HMO) | Attending: Family Medicine | Admitting: *Deleted

## 2016-01-05 DIAGNOSIS — Z713 Dietary counseling and surveillance: Secondary | ICD-10-CM | POA: Diagnosis present

## 2016-01-05 NOTE — Patient Instructions (Signed)
Plan:  Aim for 2-3 Carb Choices per meal (30-45 grams)   Aim for 0-1 Carbs per snack if hungry  Include protein in moderation with your meals and snacks Consider reading food labels for Total Carbohydrate and Fat Grams of foods Consider  increasing your activity level by doing arm chair exercises for 5 minutes twice a day, daily as tolerated    Breakfast ideas: Greek yogurt Lite fruit juice ([place] 50) Peanut butter sandwich with fruit  Lunch; Strawberry mango Salad 91/2) is an appropriate lunch Lean Cuisine or Smart Made dinners are fine Consider adding some vegetable choices to your pretzel with cheese meal  Supper: The other half of your lunch as able

## 2016-01-14 ENCOUNTER — Ambulatory Visit (INDEPENDENT_AMBULATORY_CARE_PROVIDER_SITE_OTHER): Payer: Managed Care, Other (non HMO) | Admitting: Internal Medicine

## 2016-01-14 ENCOUNTER — Encounter: Payer: Self-pay | Admitting: Internal Medicine

## 2016-01-14 ENCOUNTER — Encounter: Payer: Self-pay | Admitting: *Deleted

## 2016-01-14 VITALS — BP 110/82 | HR 68 | Ht 67.0 in | Wt 256.2 lb

## 2016-01-14 DIAGNOSIS — G4733 Obstructive sleep apnea (adult) (pediatric): Secondary | ICD-10-CM | POA: Diagnosis not present

## 2016-01-14 DIAGNOSIS — I480 Paroxysmal atrial fibrillation: Secondary | ICD-10-CM

## 2016-01-14 NOTE — Patient Instructions (Addendum)
Medication Instructions:  Your physician has recommended you make the following change in your medication:  1) Discontinue Diltiazem 30 mg.  Labwork: None ordered   Testing/Procedures: None ordered   Follow-Up: .instrYour physician recommends that you schedule a follow-up appointment in: 3 months with the a-fib clinic and 6 months with Dr. Rayann Heman.   Any Other Special Instructions Will Be Listed Below (If Applicable).     If you need a refill on your cardiac medications before your next appointment, please call your pharmacy.

## 2016-01-14 NOTE — Progress Notes (Signed)
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 female who presents today for routine electrophysiology followup.  Her ILR confirms that she has had no afib since implant.  Doing well off of AAD therapy. Today, she denies symptoms of palpitations, chest pain, shortness of breath,  lower extremity edema, presyncope, or syncope.  The patient is otherwise without complaint today.   Past Medical History:  Diagnosis Date  . Atrial fibrillation with rapid ventricular response ([place]) 04/2015  . Hyperlipidemia   . Hypothyroidism (acquired) 2006   s/p thyroidectomy for nodules  . Obesity    gastric bpg 2011, weight then 400 lbs, lost 100 lbs in 60 days.  . Obstructive sleep apnea    does not use cpap, does not have a following physician   Past Surgical History:  Procedure Laterality Date  . APPENDECTOMY    . CHOLECYSTECTOMY    . COLONOSCOPY    . ELECTROPHYSIOLOGIC STUDY N/A 09/08/2015   Procedure: Atrial Fibrillation Ablation;  Surgeon: [name], MD;  Location: [place] [facility];  Service: Cardiovascular;  Laterality: N/A;  . EP IMPLANTABLE DEVICE N/A 12/17/2015   Procedure: Loop Recorder Insertion;  Surgeon: [name], MD;  Location: [place] [facility];  Service: Cardiovascular;  Laterality: N/A;  . ESOPHAGOGASTRODUODENOSCOPY    . GASTRIC BYPASS  2011  . THYROIDECTOMY  2006   for nodules  . VAGINAL HYSTERECTOMY      ROS- all systems are reviewed and negatives except as per HPI above  Current Outpatient Prescriptions  Medication Sig Dispense Refill  . acetaminophen (TYLENOL) 500 MG tablet Take 1,000 mg by mouth every 8 (eight) hours as needed (pain).     Marland Kitchen buPROPion (WELLBUTRIN XL) 150 MG 24 hr tablet Take 3 tablets by mouth daily.    Marland Kitchen diltiazem (CARDIZEM) 30 MG tablet Cardizem 30mg  -- take 1 tablet by mouth every 4 hours AS NEEDED for heart rate >100 as long as blood pressure >100. 45 tablet 1  . gabapentin (NEURONTIN) 300 MG capsule  Take 1 capsule by mouth 2 (two) times daily.    . hydrOXYzine (ATARAX/VISTARIL) 25 MG tablet Take 3 tablets by mouth at bedtime.    Marland Kitchen ibuprofen (ADVIL,MOTRIN) 800 MG tablet Take 800 mg by mouth every 8 (eight) hours as needed (pain).     Marland Kitchen levothyroxine (SYNTHROID, LEVOTHROID) 100 MCG tablet Take 100 mcg by mouth daily.    . Vitamin D, Ergocalciferol, (DRISDOL) 50000 units CAPS capsule Take 1 capsule by mouth once a week.     No current facility-administered medications for this visit.     Physical Exam: Vitals:   01/14/16 1515  BP: 110/82  Pulse: 68  Weight: 256 lb 3.2 oz (116.2 kg)  Height: 5\' 7"  (1.702 m)    GEN- The patient is well appearing, alert and oriented x 3 today.   Head- normocephalic, atraumatic Eyes-  Sclera clear, conjunctiva pink Ears- hearing intact Oropharynx- clear Lungs- Clear to ausculation bilaterally, normal work of breathing Heart- Regular rate and rhythm, no murmurs, rubs or gallops, PMI not laterally displaced GI- soft, NT, ND, + BS Extremities- no clubbing, cyanosis, or edema  ekg today reveals sinus rhythm  Assessment and Plan:  1. afib Doing well s/p ablation without recurrence off AAD therapy ILR reveals no afib. Stop diltiazem CD chads2vasc score is 1.  She is not on anticoagulation.  2. Overweight Lifestyle modification encouraged  3. OSA She is now wearing her  CPAP.  carelink Return to AF clinic in 3 months I will see in 6 months  [name] MD, [facility] 01/14/2016 3:37 PM

## 2016-01-15 ENCOUNTER — Ambulatory Visit (INDEPENDENT_AMBULATORY_CARE_PROVIDER_SITE_OTHER): Payer: Managed Care, Other (non HMO) | Admitting: *Deleted

## 2016-01-15 DIAGNOSIS — I480 Paroxysmal atrial fibrillation: Secondary | ICD-10-CM | POA: Diagnosis not present

## 2016-01-15 NOTE — Progress Notes (Signed)
  Medical Nutrition Therapy:  Appt start time: 1400 end time:  1500.   Assessment:  Primary concerns today: patient has multiple cardiac problems along with sleep apnea. Many of her medical problems could improve with weight loss. She is here for weight management support.   Preferred Learning Style:  No preference indicated   Learning Readiness:   Ready  Change in progress   MEDICATIONS: see list   DIETARY INTAKE:  Usual eating pattern includes 3 meals and 0-2 snacks per day.  Everyday foods include variety of all food groups including high calorie choices.  Avoided foods include none stated.    24-hr recall:  B ( AM): [employer] on her way to work; small juice and apple pie  Snk ( AM): none  L ( PM): 1/2 strawberry mango salad from [employer] with grilled chicken and vinaigrette dressing Snk ( PM): none D ( PM): 3 baked pretzels with melted cheese OR Lean Cuisine Snk ( PM): none Beverages: lemonade, diet soda, OJ  Usual physical activity: walks 10-15 minutes daily with her dog  Estimated energy needs: 1400 calories 158 g carbohydrates 105 g protein 39 g fat  Progress Towards Goal(s):  In progress.   Nutritional Diagnosis:  NI-1.5 Excessive energy intake As related to activity level.  As evidenced by BMI of 40.2    Intervention:  Nutrition counseling for weight management initiated. Discussed Carb Counting by food group as method of portion control, reading food labels, and benefits of increased activity. Also discussed needed level of increased activity to allow for weight loss.  Plan:  Aim for 2-3 Carb Choices per meal (30-45 grams)   Aim for 0-1 Carbs per snack if hungry  Include protein in moderation with your meals and snacks Consider reading food labels for Total Carbohydrate and Fat Grams of foods Consider  increasing your activity level by doing arm chair exercises for 5 minutes twice a day, daily as tolerated  Breakfast ideas: Greek yogurt Lite fruit  juice ([place] 50) Peanut butter sandwich with fruit  Lunch; Strawberry mango Salad 91/2) is an appropriate lunch Lean Cuisine or Smart Made dinners are fine Consider adding some vegetable choices to your pretzel with cheese meal  Supper: The other half of your lunch as able  Teaching Method Utilized: Visual, Auditory and Hands on  Handouts given during visit include: Carb Counting and Food Label handouts Meal Plan Card  Barriers to learning/adherence to lifestyle change: none  Demonstrated degree of understanding via:  Teach Back   Monitoring/Evaluation:  Dietary intake, exercise, better restaurant choices, and body weight follow up in 1 month

## 2016-01-19 NOTE — Progress Notes (Signed)
Carelink Summary Report / Loop Recorder 

## 2016-02-03 ENCOUNTER — Ambulatory Visit: Payer: Managed Care, Other (non HMO) | Admitting: *Deleted

## 2016-02-05 ENCOUNTER — Encounter: Payer: Self-pay | Admitting: *Deleted

## 2016-02-05 ENCOUNTER — Encounter: Payer: Managed Care, Other (non HMO) | Attending: Family Medicine | Admitting: *Deleted

## 2016-02-05 DIAGNOSIS — Z713 Dietary counseling and surveillance: Secondary | ICD-10-CM | POA: Insufficient documentation

## 2016-02-05 NOTE — Progress Notes (Signed)
  Medical Nutrition Therapy:  Appt start time: 0945end time:  1000.   Assessment:  She is here for weight management support follow up visit. She states she is having a medical problem that feels like she has a hole in her stomach and there is extra air that causes uncontrolled belching. Her food intake is affected by this and she is awaiting an appointment with her primary care MD next week. So there is nothing to discuss at this visit and we will reschedule for after her next MD appointment.   Preferred Learning Style:  No preference indicated   Learning Readiness:   Ready  Change in progress   MEDICATIONS: see list   DIETARY INTAKE:  Usual eating pattern includes 3 meals and 0-2 snacks per day.  Everyday foods include variety of all food groups including high calorie choices.  Avoided foods include none stated.    24-hr recall:  B ( AM): [employer] on her way to work; small juice and apple pie  Snk ( AM): none  L ( PM): 1/2 strawberry mango salad from [employer] with grilled chicken and vinaigrette dressing Snk ( PM): none D ( PM): 3 baked pretzels with melted cheese OR Lean Cuisine Snk ( PM): none Beverages: lemonade, diet soda, OJ  Usual physical activity: walks 10-15 minutes daily with her dog  Estimated energy needs: 1400 calories 158 g carbohydrates 105 g protein 39 g fat  Progress Towards Goal(s):  In progress.   Nutritional Diagnosis:  NI-1.5 Excessive energy intake As related to activity level.  As evidenced by BMI of 40.2    Intervention: None today  Plan:  Aim for 2-3 Carb Choices per meal (30-45 grams)   Aim for 0-1 Carbs per snack if hungry  Include protein in moderation with your meals and snacks Consider reading food labels for Total Carbohydrate and Fat Grams of foods Consider  increasing your activity level by doing arm chair exercises for 5 minutes twice a day, daily as tolerated  From previous visit:   Breakfast ideas: Greek yogurt Lite fruit  juice ([employer] 50) Peanut butter sandwich with fruit  Lunch; Strawberry mango Salad 91/2) is an appropriate lunch Lean Cuisine or Smart Made dinners are fine Consider adding some vegetable choices to your pretzel with cheese meal  Supper: The other half of your lunch as able  Teaching Method Utilized:   Handouts given during visit include: None today  Barriers to learning/adherence to lifestyle change: none  Demonstrated degree of understanding via:  Teach Back   Monitoring/Evaluation:   follow up in 1 month

## 2016-02-13 LAB — CUP PACEART REMOTE DEVICE CHECK: Date Time Interrogation Session: 20170826190559

## 2016-02-13 NOTE — Progress Notes (Signed)
Carelink summary report received. Battery status OK. Normal device function. No new symptom episodes, tachy episodes, brady, or pause episodes. No new AF episodes. Monthly summary reports and ROV/PRN 

## 2016-02-15 ENCOUNTER — Ambulatory Visit (INDEPENDENT_AMBULATORY_CARE_PROVIDER_SITE_OTHER): Payer: Managed Care, Other (non HMO) | Admitting: *Deleted

## 2016-02-15 DIAGNOSIS — I480 Paroxysmal atrial fibrillation: Secondary | ICD-10-CM

## 2016-02-16 NOTE — Progress Notes (Signed)
Carelink Summary Report / Loop Recorder 

## 2016-02-17 ENCOUNTER — Ambulatory Visit: Payer: Managed Care, Other (non HMO) | Admitting: Internal Medicine

## 2016-02-17 ENCOUNTER — Encounter: Payer: Self-pay | Admitting: Cardiology

## 2016-02-17 ENCOUNTER — Ambulatory Visit: Payer: Managed Care, Other (non HMO)

## 2016-02-22 LAB — CUP PACEART REMOTE DEVICE CHECK: Date Time Interrogation Session: 20170925203820

## 2016-02-29 ENCOUNTER — Telehealth: Payer: Self-pay

## 2016-02-29 ENCOUNTER — Encounter: Payer: Self-pay | Admitting: Internal Medicine

## 2016-02-29 NOTE — Telephone Encounter (Signed)
Called patient requesting she send a manual transmission to better review alert episodes. Patient will send this evening-

## 2016-03-16 ENCOUNTER — Ambulatory Visit (INDEPENDENT_AMBULATORY_CARE_PROVIDER_SITE_OTHER): Payer: Managed Care, Other (non HMO) | Admitting: *Deleted

## 2016-03-16 DIAGNOSIS — I480 Paroxysmal atrial fibrillation: Secondary | ICD-10-CM

## 2016-03-17 NOTE — Progress Notes (Signed)
Carelink Summary Report / Loop Recorder 

## 2016-04-16 LAB — CUP PACEART REMOTE DEVICE CHECK
Implantable Pulse Generator Implant Date: 20170727
MDC IDC SESS DTM: 20171025211144

## 2016-04-16 NOTE — Progress Notes (Signed)
Carelink summary report received. Battery status OK. Normal device function. No new symptom episodes, tachy episodes, brady, or pause episodes. 17 AF 0.2%, some ECGs appear SR w/ PACs, bursts of PAT, others appear to be irregular VS, AF. Per July JA note, chads2vasc 1, no anticoagulation.  Monthly summary reports and ROV/PRN

## 2016-04-18 ENCOUNTER — Ambulatory Visit (INDEPENDENT_AMBULATORY_CARE_PROVIDER_SITE_OTHER): Payer: Managed Care, Other (non HMO) | Admitting: *Deleted

## 2016-04-18 DIAGNOSIS — I480 Paroxysmal atrial fibrillation: Secondary | ICD-10-CM | POA: Diagnosis not present

## 2016-04-20 NOTE — Progress Notes (Signed)
Carelink Summary Report / Loop Recorder 

## 2016-05-17 ENCOUNTER — Ambulatory Visit (INDEPENDENT_AMBULATORY_CARE_PROVIDER_SITE_OTHER): Payer: Self-pay | Admitting: *Deleted

## 2016-05-17 DIAGNOSIS — I480 Paroxysmal atrial fibrillation: Secondary | ICD-10-CM

## 2016-05-18 NOTE — Progress Notes (Signed)
Carelink Summary Report / Loop Recorder 

## 2016-05-23 HISTORY — PX: EXPLORATORY LAPAROTOMY WITH ABDOMINAL MASS EXCISION: SHX5169

## 2016-05-28 LAB — CUP PACEART REMOTE DEVICE CHECK
MDC IDC PG IMPLANT DT: 20170727
MDC IDC SESS DTM: 20171124220553

## 2016-05-28 NOTE — Progress Notes (Signed)
Carelink summary report received. Battery status OK. Normal device function. No tachy episodes, brady, or pause episodes. No new AF episodes. 1 symptom episode- available ECG appears SR/ST, Monthly summary reports and ROV/PRN

## 2016-06-14 ENCOUNTER — Ambulatory Visit (INDEPENDENT_AMBULATORY_CARE_PROVIDER_SITE_OTHER): Payer: Commercial Managed Care - PPO | Admitting: *Deleted

## 2016-06-14 ENCOUNTER — Telehealth: Payer: Self-pay | Admitting: Internal Medicine

## 2016-06-14 DIAGNOSIS — I480 Paroxysmal atrial fibrillation: Secondary | ICD-10-CM | POA: Diagnosis not present

## 2016-06-14 NOTE — Telephone Encounter (Signed)
No intervention necessary for loop recorder. Routing to Janan Halter, RN for cardiac clearance/ medication recommendations.

## 2016-06-14 NOTE — Telephone Encounter (Signed)
New message       Request for surgical clearance:  What type of surgery is being performed?  colonoscopy and upper endoscopy When is this surgery scheduled?  07-08-16 Are there any medications that need to be held prior to surgery and how long?  Need cardiac clearance and loop recorder instructions 1. Name of physician performing surgery?  Dr Christia Reading Misenheimer  What is your office phone and fax number?  Fax [phone]

## 2016-06-15 NOTE — Telephone Encounter (Signed)
Discussed with Dr Rayann Heman, okay to proceed.

## 2016-06-15 NOTE — Progress Notes (Signed)
Carelink Summary Report / Loop Recorder 

## 2016-06-28 ENCOUNTER — Other Ambulatory Visit: Payer: Self-pay | Admitting: Internal Medicine

## 2016-07-02 LAB — CUP PACEART REMOTE DEVICE CHECK
Implantable Pulse Generator Implant Date: 20170727
MDC IDC SESS DTM: 20171224231123

## 2016-07-02 NOTE — Progress Notes (Signed)
Carelink summary report received. Battery status OK. Normal device function. No new symptom episodes, tachy episodes, brady, or pause episodes. No new AF episodes. Monthly summary reports and ROV/PRN 

## 2016-07-14 ENCOUNTER — Ambulatory Visit (INDEPENDENT_AMBULATORY_CARE_PROVIDER_SITE_OTHER): Payer: Commercial Managed Care - PPO | Admitting: *Deleted

## 2016-07-14 DIAGNOSIS — I480 Paroxysmal atrial fibrillation: Secondary | ICD-10-CM

## 2016-07-15 NOTE — Progress Notes (Signed)
Carelink Summary Report / Loop Recorder 

## 2016-07-17 LAB — CUP PACEART REMOTE DEVICE CHECK
Implantable Pulse Generator Implant Date: 20170727
MDC IDC SESS DTM: 20180123233611

## 2016-08-03 LAB — CUP PACEART REMOTE DEVICE CHECK
Date Time Interrogation Session: 20180222233711
MDC IDC PG IMPLANT DT: 20170727

## 2016-08-15 ENCOUNTER — Ambulatory Visit (INDEPENDENT_AMBULATORY_CARE_PROVIDER_SITE_OTHER): Payer: Commercial Managed Care - PPO | Admitting: *Deleted

## 2016-08-15 DIAGNOSIS — I480 Paroxysmal atrial fibrillation: Secondary | ICD-10-CM | POA: Diagnosis not present

## 2016-08-16 NOTE — Progress Notes (Signed)
Carelink Summary Report / Loop Recorder 

## 2016-08-27 LAB — CUP PACEART REMOTE DEVICE CHECK
Implantable Pulse Generator Implant Date: 20170727
MDC IDC SESS DTM: 20180325001353

## 2016-09-12 ENCOUNTER — Ambulatory Visit (INDEPENDENT_AMBULATORY_CARE_PROVIDER_SITE_OTHER): Payer: Commercial Managed Care - PPO | Admitting: *Deleted

## 2016-09-12 DIAGNOSIS — I480 Paroxysmal atrial fibrillation: Secondary | ICD-10-CM

## 2016-09-13 NOTE — Progress Notes (Signed)
Carelink Summary Report / Loop Recorder 

## 2016-09-23 DIAGNOSIS — R1900 Intra-abdominal and pelvic swelling, mass and lump, unspecified site: Secondary | ICD-10-CM | POA: Insufficient documentation

## 2016-09-23 DIAGNOSIS — Z9889 Other specified postprocedural states: Secondary | ICD-10-CM | POA: Insufficient documentation

## 2016-09-23 DIAGNOSIS — Z9884 Bariatric surgery status: Secondary | ICD-10-CM | POA: Insufficient documentation

## 2016-09-24 ENCOUNTER — Encounter: Payer: Self-pay | Admitting: Internal Medicine

## 2016-09-27 LAB — CUP PACEART REMOTE DEVICE CHECK
Implantable Pulse Generator Implant Date: 20170727
MDC IDC SESS DTM: 20180424003722

## 2016-09-29 DIAGNOSIS — F331 Major depressive disorder, recurrent, moderate: Secondary | ICD-10-CM | POA: Insufficient documentation

## 2016-09-29 DIAGNOSIS — E039 Hypothyroidism, unspecified: Secondary | ICD-10-CM | POA: Diagnosis present

## 2016-09-29 DIAGNOSIS — G4700 Insomnia, unspecified: Secondary | ICD-10-CM | POA: Insufficient documentation

## 2016-09-29 DIAGNOSIS — Z9889 Other specified postprocedural states: Secondary | ICD-10-CM | POA: Insufficient documentation

## 2016-09-29 DIAGNOSIS — F32A Depression, unspecified: Secondary | ICD-10-CM | POA: Insufficient documentation

## 2016-09-29 DIAGNOSIS — Z8679 Personal history of other diseases of the circulatory system: Secondary | ICD-10-CM | POA: Insufficient documentation

## 2016-10-11 DIAGNOSIS — R1084 Generalized abdominal pain: Secondary | ICD-10-CM | POA: Insufficient documentation

## 2016-10-12 ENCOUNTER — Ambulatory Visit (INDEPENDENT_AMBULATORY_CARE_PROVIDER_SITE_OTHER): Payer: Commercial Managed Care - PPO | Admitting: *Deleted

## 2016-10-12 DIAGNOSIS — I480 Paroxysmal atrial fibrillation: Secondary | ICD-10-CM | POA: Diagnosis not present

## 2016-10-13 NOTE — Progress Notes (Signed)
Carelink Summary Report / Loop Recorder 

## 2016-10-14 LAB — CUP PACEART REMOTE DEVICE CHECK
Implantable Pulse Generator Implant Date: 20170727
MDC IDC SESS DTM: 20180524013613

## 2016-11-11 ENCOUNTER — Ambulatory Visit (INDEPENDENT_AMBULATORY_CARE_PROVIDER_SITE_OTHER): Payer: Commercial Managed Care - PPO | Admitting: *Deleted

## 2016-11-11 DIAGNOSIS — I480 Paroxysmal atrial fibrillation: Secondary | ICD-10-CM | POA: Diagnosis not present

## 2016-11-14 NOTE — Progress Notes (Signed)
Carelink Summary Report / Loop Recorder 

## 2016-11-18 LAB — CUP PACEART REMOTE DEVICE CHECK
Implantable Pulse Generator Implant Date: 20170727
MDC IDC SESS DTM: 20180623014141

## 2016-11-21 ENCOUNTER — Encounter: Payer: Self-pay | Admitting: Internal Medicine

## 2016-11-21 ENCOUNTER — Ambulatory Visit (INDEPENDENT_AMBULATORY_CARE_PROVIDER_SITE_OTHER): Payer: Commercial Managed Care - PPO | Admitting: Internal Medicine

## 2016-11-21 VITALS — BP 110/78 | HR 91 | Ht 67.0 in | Wt 223.6 lb

## 2016-11-21 DIAGNOSIS — I480 Paroxysmal atrial fibrillation: Secondary | ICD-10-CM | POA: Diagnosis not present

## 2016-11-21 DIAGNOSIS — G4733 Obstructive sleep apnea (adult) (pediatric): Secondary | ICD-10-CM | POA: Diagnosis not present

## 2016-11-21 NOTE — Patient Instructions (Signed)
Medication Instructions:    Your physician recommends that you continue on your current medications as directed. Please refer to the Current Medication list given to you today.  --- If you need a refill on your cardiac medications before your next appointment, please call your pharmacy. ---  Labwork:  None ordered  Testing/Procedures:  None ordered  Follow-Up: Remote monitoring is used to monitor your Pacemaker of ICD from home. This monitoring reduces the number of office visits required to check your device to one time per year. It allows [place] to keep an eye on the functioning of your device to ensure it is working properly. You are scheduled for a device check from home on 02/20/2017. You may send your transmission at any time that day. If you have a wireless device, the transmission will be sent automatically. After your physician reviews your transmission, you will receive a postcard with your next transmission date.   Your physician wants you to follow-up in: 6 months.  You will receive a reminder letter in the mail two months in advance. If you don't receive a letter, please call our office to schedule the follow-up appointment.  Thank you for choosing [facility]!!

## 2016-11-21 NOTE — Progress Notes (Signed)
   PCP: Martinique, Sarah T, MD   Monique Kelly is a 61 y.o. female who presents today for routine electrophysiology followup.  Since last being seen in our clinic, the patient reports doing reasonably well.  She is s/p resection of a peritoneal mass.  She is making good recovery.  No afib.  Doing well.  Today, she denies symptoms of palpitations, chest pain, shortness of breath,  lower extremity edema, dizziness, presyncope, or syncope.  The patient is otherwise without complaint today.   Past Medical History:  Diagnosis Date  . Atrial fibrillation with rapid ventricular response ([place]) 04/2015  . Hyperlipidemia   . Hypothyroidism (acquired) 2006   s/p thyroidectomy for nodules  . Obesity    gastric bpg 2011, weight then 400 lbs, lost 100 lbs in 60 days.  . Obstructive sleep apnea    does not use cpap, does not have a following physician   Past Surgical History:  Procedure Laterality Date  . APPENDECTOMY    . CHOLECYSTECTOMY    . COLONOSCOPY    . ELECTROPHYSIOLOGIC STUDY N/A 09/08/2015   Procedure: Atrial Fibrillation Ablation;  Surgeon: [name], MD;  Location: [facility];  Service: Cardiovascular;  Laterality: N/A;  . EP IMPLANTABLE DEVICE N/A 12/17/2015   Procedure: Loop Recorder Insertion;  Surgeon: [name], MD;  Location: [facility];  Service: Cardiovascular;  Laterality: N/A;  . ESOPHAGOGASTRODUODENOSCOPY    . GASTRIC BYPASS  2011  . THYROIDECTOMY  2006   for nodules  . VAGINAL HYSTERECTOMY      ROS- all systems are reviewed and negatives except as per HPI above  Current Outpatient Prescriptions  Medication Sig Dispense Refill  . acetaminophen (TYLENOL) 500 MG tablet Take 1,000 mg by mouth every 8 (eight) hours as needed (pain).     . BuPROPion HCl ER, XL, 450 MG TB24 Take 450 mg by mouth daily.    Marland Kitchen gabapentin (NEURONTIN) 300 MG capsule Take 1 capsule by mouth 2 (two) times daily.    . hydrOXYzine (ATARAX/VISTARIL) 25 MG tablet Take 3 tablets by mouth  at bedtime.    Marland Kitchen levothyroxine (SYNTHROID, LEVOTHROID) 88 MCG tablet Take 88 mcg by mouth daily.    . Vitamin D, Ergocalciferol, (DRISDOL) 50000 units CAPS capsule Take 1 capsule by mouth once a week.     No current facility-administered medications for this visit.     Physical Exam: Vitals:   11/21/16 1557  BP: 110/78  Pulse: 91  SpO2: 99%  Weight: 223 lb 9.6 oz (101.4 kg)  Height: 5\' 7"  (1.702 m)    GEN- The patient is well appearing, alert and oriented x 3 today.   Head- normocephalic, atraumatic Eyes-  Sclera clear, conjunctiva pink Ears- hearing intact Oropharynx- clear Lungs- Clear to ausculation bilaterally, normal work of breathing Heart- Regular rate and rhythm, no murmurs, rubs or gallops, PMI not laterally displaced GI- soft, NT, ND, + BS Extremities- no clubbing, cyanosis, or edema  ILR is reviewed and reveals sinus rhythm, rare PACs, PVCs, no recent afib  Assessment and Plan:  1. afib Doing well s/p ablation off AAD therapy Pleased with current health state Not on anticoagulation due to chads2vasc score of 1  2. Overweight Body mass index is 35.02 kg/m. Weight loss would be beneficial  3. OSA Compliance with CPAP encouraged  Carelink Return to see me in 6 months  [name] MD, [facility] 11/21/2016 4:42 PM

## 2016-12-12 ENCOUNTER — Ambulatory Visit (INDEPENDENT_AMBULATORY_CARE_PROVIDER_SITE_OTHER): Payer: Commercial Managed Care - PPO | Admitting: *Deleted

## 2016-12-12 DIAGNOSIS — I480 Paroxysmal atrial fibrillation: Secondary | ICD-10-CM

## 2016-12-14 NOTE — Progress Notes (Signed)
Carelink Summary Report / Loop Recorder 

## 2016-12-25 LAB — CUP PACEART REMOTE DEVICE CHECK
MDC IDC PG IMPLANT DT: 20170727
MDC IDC SESS DTM: 20180723020930

## 2016-12-25 NOTE — Progress Notes (Signed)
Carelink summary report received. Battery status OK. Normal device function. No new symptom episodes, tachy episodes, brady, or pause episodes. No new AF episodes. Monthly summary reports and ROV/PRN 

## 2017-01-10 ENCOUNTER — Ambulatory Visit (INDEPENDENT_AMBULATORY_CARE_PROVIDER_SITE_OTHER): Payer: Commercial Managed Care - PPO | Admitting: *Deleted

## 2017-01-10 DIAGNOSIS — I480 Paroxysmal atrial fibrillation: Secondary | ICD-10-CM | POA: Diagnosis not present

## 2017-01-11 NOTE — Progress Notes (Signed)
Carelink Summary Report / Loop Recorder 

## 2017-01-15 LAB — CUP PACEART REMOTE DEVICE CHECK
MDC IDC PG IMPLANT DT: 20170727
MDC IDC SESS DTM: 20180822023848

## 2017-02-09 ENCOUNTER — Ambulatory Visit (INDEPENDENT_AMBULATORY_CARE_PROVIDER_SITE_OTHER): Payer: Commercial Managed Care - PPO | Admitting: *Deleted

## 2017-02-09 DIAGNOSIS — I480 Paroxysmal atrial fibrillation: Secondary | ICD-10-CM

## 2017-02-10 LAB — CUP PACEART REMOTE DEVICE CHECK
Implantable Pulse Generator Implant Date: 20170727
MDC IDC SESS DTM: 20180921033905

## 2017-02-10 NOTE — Progress Notes (Signed)
Carelink Summary Report / Loop Recorder 

## 2017-03-13 ENCOUNTER — Ambulatory Visit (INDEPENDENT_AMBULATORY_CARE_PROVIDER_SITE_OTHER): Payer: Commercial Managed Care - PPO | Admitting: *Deleted

## 2017-03-13 DIAGNOSIS — I480 Paroxysmal atrial fibrillation: Secondary | ICD-10-CM | POA: Diagnosis not present

## 2017-03-13 NOTE — Progress Notes (Signed)
Carelink Summary Report / Loop Recorder 

## 2017-03-15 LAB — CUP PACEART REMOTE DEVICE CHECK
Date Time Interrogation Session: 20181021033831
Implantable Pulse Generator Implant Date: 20170727

## 2017-03-27 ENCOUNTER — Ambulatory Visit (INDEPENDENT_AMBULATORY_CARE_PROVIDER_SITE_OTHER): Payer: Commercial Managed Care - PPO | Admitting: Podiatry

## 2017-03-27 ENCOUNTER — Other Ambulatory Visit: Payer: Self-pay | Admitting: Podiatry

## 2017-03-27 ENCOUNTER — Ambulatory Visit (INDEPENDENT_AMBULATORY_CARE_PROVIDER_SITE_OTHER): Payer: Commercial Managed Care - PPO

## 2017-03-27 ENCOUNTER — Encounter: Payer: Self-pay | Admitting: Podiatry

## 2017-03-27 VITALS — BP 106/66 | HR 79

## 2017-03-27 DIAGNOSIS — M722 Plantar fascial fibromatosis: Secondary | ICD-10-CM

## 2017-03-27 DIAGNOSIS — M216X9 Other acquired deformities of unspecified foot: Secondary | ICD-10-CM | POA: Diagnosis not present

## 2017-03-27 DIAGNOSIS — M7662 Achilles tendinitis, left leg: Secondary | ICD-10-CM | POA: Diagnosis not present

## 2017-03-27 DIAGNOSIS — M7661 Achilles tendinitis, right leg: Secondary | ICD-10-CM

## 2017-03-27 MED ORDER — MELOXICAM 15 MG PO TABS: 15.0000 mg | ORAL_TABLET | Freq: Every day | ORAL | 0 refills | Status: DC

## 2017-03-27 NOTE — Patient Instructions (Signed)

## 2017-03-27 NOTE — Progress Notes (Signed)
   Subjective:    Patient ID: Monique Kelly, female    DOB: [birth year], 61 y.o.   MRN: [id number] Chief Complaint  Patient presents with  . Foot Pain    hx of foot pain L>R worse in the am started about 2 wks ago     HPI 61 y.o. female presents with the above complaint.  Reports 2-week history of back of heel pain to both heels left greater than right.  Has a history of plantar fasciitis and fibromyalgia.  Past Medical History:  Diagnosis Date  . Atrial fibrillation with rapid ventricular response ([place]) 04/2015  . Hyperlipidemia   . Hypothyroidism (acquired) 2006   s/p thyroidectomy for nodules  . Obesity    gastric bpg 2011, weight then 400 lbs, lost 100 lbs in 60 days.  . Obstructive sleep apnea    does not use cpap, does not have a following physician   Past Surgical History:  Procedure Laterality Date  . APPENDECTOMY    . CHOLECYSTECTOMY    . COLONOSCOPY    . ESOPHAGOGASTRODUODENOSCOPY    . GASTRIC BYPASS  2011  . THYROIDECTOMY  2006   for nodules  . VAGINAL HYSTERECTOMY      Current Outpatient Medications:  .  acetaminophen (TYLENOL) 500 MG tablet, Take 1,000 mg by mouth every 8 (eight) hours as needed (pain). , Disp: , Rfl:  .  gabapentin (NEURONTIN) 300 MG capsule, Take 1 capsule by mouth 2 (two) times daily., Disp: , Rfl:  .  levothyroxine (SYNTHROID, LEVOTHROID) 88 MCG tablet, Take 88 mcg by mouth daily., Disp: , Rfl:  .  BuPROPion HCl ER, XL, 450 MG TB24, Take 450 mg by mouth daily., Disp: , Rfl:  .  hydrOXYzine (ATARAX/VISTARIL) 25 MG tablet, Take 3 tablets by mouth at bedtime., Disp: , Rfl:  .  Vitamin D, Ergocalciferol, (DRISDOL) 50000 units CAPS capsule, Take 1 capsule by mouth once a week., Disp: , Rfl:   No Known Allergies    Review of Systems  Cardiovascular: Positive for chest pain, palpitations and leg swelling.  Gastrointestinal: Positive for abdominal distention.  Endocrine: Positive for polydipsia.  Musculoskeletal: Positive for myalgias.        Objective:   Physical Exam Vitals:   03/27/17 0823  BP: 106/66  Pulse: 79   General AA&O x3. Normal mood and affect.  Vascular Dorsalis pedis and posterior tibial pulses  present 2+ bilaterally  Capillary refill normal to all digits. Pedal hair growth normal.  Neurologic Epicritic sensation grossly present bilaterally.  Dermatologic No open lesions. Interspaces clear of maceration. Nails well groomed and normal in appearance.  Orthopedic: MMT 5/5 in dorsiflexion, plantarflexion, inversion, and eversion bilaterally. Tenderness to palpation at the posterior calcaneus at the AT insertion bilaterally. No pain with calcaneal squeeze bilaterally. Ankle ROM diminished range of motion bilaterally. Silfverskiold Test: positive bilaterally.   Radiographs: Taken and reviewed. No acute fractures. No evidence of calcaneal stress fracture. Posterior and plantar calcaenal spurring noted.    Assessment & Plan:   Achilles Tendonitis, bilaterally -XR reviewed as above. -Educated on stretching and icing of the affected limb. -Night splint dispensed. -eRx for Meloxicam  Return in about 4 weeks (around 04/24/2017) for Plantar fasciitis.

## 2017-04-10 ENCOUNTER — Ambulatory Visit (INDEPENDENT_AMBULATORY_CARE_PROVIDER_SITE_OTHER): Payer: Commercial Managed Care - PPO | Admitting: *Deleted

## 2017-04-10 DIAGNOSIS — I480 Paroxysmal atrial fibrillation: Secondary | ICD-10-CM | POA: Diagnosis not present

## 2017-04-11 ENCOUNTER — Other Ambulatory Visit: Payer: Self-pay | Admitting: Nurse Practitioner

## 2017-04-11 NOTE — Progress Notes (Signed)
Carelink Summary Report / Loop Recorder 

## 2017-04-24 ENCOUNTER — Ambulatory Visit: Payer: Commercial Managed Care - PPO | Admitting: Podiatry

## 2017-04-27 LAB — CUP PACEART REMOTE DEVICE CHECK
MDC IDC PG IMPLANT DT: 20170727
MDC IDC SESS DTM: 20181120033935

## 2017-05-10 ENCOUNTER — Ambulatory Visit (INDEPENDENT_AMBULATORY_CARE_PROVIDER_SITE_OTHER): Payer: Commercial Managed Care - PPO | Admitting: *Deleted

## 2017-05-10 DIAGNOSIS — I480 Paroxysmal atrial fibrillation: Secondary | ICD-10-CM

## 2017-05-11 NOTE — Progress Notes (Signed)
Carelink Summary Report / Loop Recorder 

## 2017-05-23 HISTORY — PX: BREAST SURGERY: SHX581

## 2017-05-25 LAB — CUP PACEART REMOTE DEVICE CHECK
Date Time Interrogation Session: 20181220033906
MDC IDC PG IMPLANT DT: 20170727

## 2017-05-30 DIAGNOSIS — C50411 Malignant neoplasm of upper-outer quadrant of right female breast: Secondary | ICD-10-CM | POA: Insufficient documentation

## 2017-06-01 ENCOUNTER — Encounter: Payer: Self-pay | Admitting: Internal Medicine

## 2017-06-09 ENCOUNTER — Ambulatory Visit (INDEPENDENT_AMBULATORY_CARE_PROVIDER_SITE_OTHER): Payer: Commercial Managed Care - PPO | Admitting: *Deleted

## 2017-06-09 DIAGNOSIS — I480 Paroxysmal atrial fibrillation: Secondary | ICD-10-CM | POA: Diagnosis not present

## 2017-06-12 ENCOUNTER — Encounter: Payer: Self-pay | Admitting: Internal Medicine

## 2017-06-12 ENCOUNTER — Ambulatory Visit: Payer: Commercial Managed Care - PPO | Admitting: Internal Medicine

## 2017-06-12 VITALS — BP 110/68 | HR 81 | Ht 67.0 in | Wt 236.0 lb

## 2017-06-12 DIAGNOSIS — G4733 Obstructive sleep apnea (adult) (pediatric): Secondary | ICD-10-CM

## 2017-06-12 DIAGNOSIS — I480 Paroxysmal atrial fibrillation: Secondary | ICD-10-CM

## 2017-06-12 NOTE — Patient Instructions (Signed)
Medication Instructions:  Your physician recommends that you continue on your current medications as directed. Please refer to the Current Medication list given to you today.    Labwork: None ordered   Testing/Procedures: Your physician has requested that you have an echocardiogram. Echocardiography is a painless test that uses sound waves to create images of your heart. It provides your doctor with information about the size and shape of your heart and how well your heart's chambers and valves are working. This procedure takes approximately one hour. There are no restrictions for this procedure.    Follow-Up: Your physician wants you to follow-up in: 6 months with Monique Marshall, NP and 12 months with Dr Monique Kelly.  Please call our office to schedule the follow-up appointment.    Any Other Special Instructions Will Be Listed Below (If Applicable).     If you need a refill on your cardiac medications before your next appointment, please call your pharmacy.

## 2017-06-12 NOTE — Progress Notes (Signed)
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 female who presents today for routine electrophysiology followup.  Since last being seen in our clinic, the patient reports doing reasonably well.  She has been diagnosed with breast cancer.  Today, she denies symptoms of palpitations, chest pain, shortness of breath,  lower extremity edema, dizziness, presyncope, or syncope.  The patient is otherwise without complaint today.   Past Medical History:  Diagnosis Date  . Atrial fibrillation with rapid ventricular response ([place]) 04/2015  . Hyperlipidemia   . Hypothyroidism (acquired) 2006   s/p thyroidectomy for nodules  . Obesity    gastric bpg 2011, weight then 400 lbs, lost 100 lbs in 60 days.  . Obstructive sleep apnea    does not use cpap, does not have a following physician   Past Surgical History:  Procedure Laterality Date  . APPENDECTOMY    . CHOLECYSTECTOMY    . COLONOSCOPY    . ELECTROPHYSIOLOGIC STUDY N/A 09/08/2015   Procedure: Atrial Fibrillation Ablation;  Surgeon: [name], MD;  Location: [facility];  Service: Cardiovascular;  Laterality: N/A;  . EP IMPLANTABLE DEVICE N/A 12/17/2015   Procedure: Loop Recorder Insertion;  Surgeon: [name], MD;  Location: [facility];  Service: Cardiovascular;  Laterality: N/A;  . ESOPHAGOGASTRODUODENOSCOPY    . GASTRIC BYPASS  2011  . THYROIDECTOMY  2006   for nodules  . VAGINAL HYSTERECTOMY      ROS- all systems are reviewed and negatives except as per HPI above  Current Outpatient Medications  Medication Sig Dispense Refill  . acetaminophen (TYLENOL) 500 MG tablet Take 1,000 mg by mouth every 8 (eight) hours as needed (pain).     . BuPROPion HCl ER, XL, 450 MG TB24 Take 450 mg by mouth daily.    Monique Kitchen gabapentin (NEURONTIN) 300 MG capsule Take 1 capsule by mouth 2 (two) times daily.    Monique Kitchen levothyroxine (SYNTHROID, LEVOTHROID) 88 MCG tablet Take 88 mcg by mouth daily.     No current  facility-administered medications for this visit.     Physical Exam: Vitals:   06/12/17 0804  BP: 110/68  Pulse: 81  SpO2: 97%  Weight: 236 lb (107 kg)  Height: 5\' 7"  (1.702 m)    GEN- The patient is well appearing, alert and oriented x 3 today.   Head- normocephalic, atraumatic Eyes-  Sclera clear, conjunctiva pink Ears- hearing intact Oropharynx- clear Lungs- Clear to ausculation bilaterally, normal work of breathing Heart- Regular rate and rhythm, no murmurs, rubs or gallops, PMI not laterally displaced GI- soft, NT, ND, + BS Extremities- no clubbing, cyanosis, or edema  EKG tracing ordered today is personally reviewed and shows sinus rhythm 81 bpm, nonspecific St/Kelly changes  Assessment and Plan:  1. Atrial fibrillation Doing well s/p ablation without recurrence ILR reveals no afib since last visit Not on anticoagulation (chads2vasc score is 1) Echo to evaluate for structural changes  2. OSA Not using CPAP due to costs We discussed possible referral to [facility] after she recovers from breast cancer treatment to consider other options for her sleep apnea.  3. Overweight Body mass index is 36.96 kg/m. Lifestyle modification encouraged  4. Breast cancer She has recently been diagnosed and has surgery planned. OK to proceed with surgery without any further CV workup She has been reading on the internet and is worried about CV effects of radiation therapy.  I will obtain  baseline echo today and repeat after she has completed treatment.  I have reassured her today that I think she will do fine.  carelink Return to see EP NP in 6 months Return to see me in a year  [name] MD, [facility] 06/12/2017 8:09 AM

## 2017-06-12 NOTE — Progress Notes (Signed)
Carelink Summary Report / Loop Recorder 

## 2017-06-13 LAB — CUP PACEART REMOTE DEVICE CHECK
Date Time Interrogation Session: 20190119092056
MDC IDC PG IMPLANT DT: 20170727

## 2017-06-14 DIAGNOSIS — Z01818 Encounter for other preprocedural examination: Secondary | ICD-10-CM | POA: Diagnosis not present

## 2017-06-16 LAB — CUP PACEART INCLINIC DEVICE CHECK
Implantable Pulse Generator Implant Date: 20170727
MDC IDC SESS DTM: 20190121133403

## 2017-06-26 ENCOUNTER — Other Ambulatory Visit (HOSPITAL_COMMUNITY): Payer: Commercial Managed Care - PPO

## 2017-06-26 ENCOUNTER — Other Ambulatory Visit: Payer: Self-pay

## 2017-06-26 ENCOUNTER — Ambulatory Visit (HOSPITAL_COMMUNITY): Payer: Commercial Managed Care - PPO | Attending: Cardiovascular Disease

## 2017-06-26 DIAGNOSIS — I480 Paroxysmal atrial fibrillation: Secondary | ICD-10-CM | POA: Diagnosis not present

## 2017-06-26 DIAGNOSIS — Z09 Encounter for follow-up examination after completed treatment for conditions other than malignant neoplasm: Secondary | ICD-10-CM | POA: Insufficient documentation

## 2017-06-28 DIAGNOSIS — C50211 Malignant neoplasm of upper-inner quadrant of right female breast: Secondary | ICD-10-CM | POA: Diagnosis not present

## 2017-06-28 DIAGNOSIS — Z170 Estrogen receptor positive status [ER+]: Secondary | ICD-10-CM | POA: Diagnosis not present

## 2017-07-10 ENCOUNTER — Ambulatory Visit (INDEPENDENT_AMBULATORY_CARE_PROVIDER_SITE_OTHER): Payer: Commercial Managed Care - PPO | Admitting: *Deleted

## 2017-07-10 DIAGNOSIS — I480 Paroxysmal atrial fibrillation: Secondary | ICD-10-CM | POA: Diagnosis not present

## 2017-07-10 DIAGNOSIS — Z170 Estrogen receptor positive status [ER+]: Secondary | ICD-10-CM | POA: Diagnosis not present

## 2017-07-10 DIAGNOSIS — C50211 Malignant neoplasm of upper-inner quadrant of right female breast: Secondary | ICD-10-CM | POA: Diagnosis not present

## 2017-07-11 ENCOUNTER — Encounter: Payer: Self-pay | Admitting: Internal Medicine

## 2017-07-11 NOTE — Progress Notes (Signed)
Carelink Summary Report / Loop Recorder 

## 2017-07-14 DIAGNOSIS — N6489 Other specified disorders of breast: Secondary | ICD-10-CM | POA: Insufficient documentation

## 2017-07-17 DIAGNOSIS — C50211 Malignant neoplasm of upper-inner quadrant of right female breast: Secondary | ICD-10-CM | POA: Diagnosis not present

## 2017-07-17 DIAGNOSIS — Z170 Estrogen receptor positive status [ER+]: Secondary | ICD-10-CM | POA: Diagnosis not present

## 2017-08-07 LAB — CUP PACEART REMOTE DEVICE CHECK
Date Time Interrogation Session: 20190219085500
Implantable Pulse Generator Implant Date: 20170727

## 2017-08-14 ENCOUNTER — Ambulatory Visit (INDEPENDENT_AMBULATORY_CARE_PROVIDER_SITE_OTHER): Payer: Commercial Managed Care - PPO | Admitting: *Deleted

## 2017-08-14 DIAGNOSIS — I480 Paroxysmal atrial fibrillation: Secondary | ICD-10-CM | POA: Diagnosis not present

## 2017-08-14 NOTE — Progress Notes (Signed)
Carelink Summary Report / Loop Recorder 

## 2017-08-29 DIAGNOSIS — G620 Drug-induced polyneuropathy: Secondary | ICD-10-CM | POA: Diagnosis not present

## 2017-08-29 DIAGNOSIS — Z170 Estrogen receptor positive status [ER+]: Secondary | ICD-10-CM | POA: Diagnosis not present

## 2017-08-29 DIAGNOSIS — C50211 Malignant neoplasm of upper-inner quadrant of right female breast: Secondary | ICD-10-CM | POA: Diagnosis not present

## 2017-08-29 DIAGNOSIS — J189 Pneumonia, unspecified organism: Secondary | ICD-10-CM | POA: Diagnosis not present

## 2017-09-15 ENCOUNTER — Ambulatory Visit (INDEPENDENT_AMBULATORY_CARE_PROVIDER_SITE_OTHER): Payer: Commercial Managed Care - PPO | Admitting: *Deleted

## 2017-09-15 DIAGNOSIS — I480 Paroxysmal atrial fibrillation: Secondary | ICD-10-CM

## 2017-09-15 NOTE — Progress Notes (Signed)
Carelink Summary Report / Loop Recorder 

## 2017-09-23 LAB — CUP PACEART REMOTE DEVICE CHECK
Implantable Pulse Generator Implant Date: 20170727
MDC IDC SESS DTM: 20190324121031

## 2017-09-26 DIAGNOSIS — E46 Unspecified protein-calorie malnutrition: Secondary | ICD-10-CM | POA: Diagnosis not present

## 2017-09-26 DIAGNOSIS — F411 Generalized anxiety disorder: Secondary | ICD-10-CM | POA: Diagnosis not present

## 2017-09-26 DIAGNOSIS — D6481 Anemia due to antineoplastic chemotherapy: Secondary | ICD-10-CM | POA: Diagnosis not present

## 2017-09-26 DIAGNOSIS — Z170 Estrogen receptor positive status [ER+]: Secondary | ICD-10-CM | POA: Diagnosis not present

## 2017-09-26 DIAGNOSIS — C50211 Malignant neoplasm of upper-inner quadrant of right female breast: Secondary | ICD-10-CM | POA: Diagnosis not present

## 2017-10-10 LAB — CUP PACEART REMOTE DEVICE CHECK
Date Time Interrogation Session: 20190426120915
Implantable Pulse Generator Implant Date: 20170727

## 2017-10-13 DIAGNOSIS — Z780 Asymptomatic menopausal state: Secondary | ICD-10-CM | POA: Insufficient documentation

## 2017-10-13 DIAGNOSIS — M8580 Other specified disorders of bone density and structure, unspecified site: Secondary | ICD-10-CM | POA: Insufficient documentation

## 2017-10-15 ENCOUNTER — Encounter: Payer: Self-pay | Admitting: Internal Medicine

## 2017-10-18 ENCOUNTER — Ambulatory Visit (INDEPENDENT_AMBULATORY_CARE_PROVIDER_SITE_OTHER): Payer: Commercial Managed Care - PPO | Admitting: *Deleted

## 2017-10-18 DIAGNOSIS — I480 Paroxysmal atrial fibrillation: Secondary | ICD-10-CM

## 2017-10-18 NOTE — Progress Notes (Signed)
Carelink Summary Report / Loop Recorder 

## 2017-11-09 ENCOUNTER — Other Ambulatory Visit: Payer: Self-pay

## 2017-11-09 ENCOUNTER — Other Ambulatory Visit: Payer: Self-pay | Admitting: Oncology

## 2017-11-09 DIAGNOSIS — M79605 Pain in left leg: Secondary | ICD-10-CM

## 2017-11-09 DIAGNOSIS — C50411 Malignant neoplasm of upper-outer quadrant of right female breast: Secondary | ICD-10-CM

## 2017-11-09 DIAGNOSIS — R202 Paresthesia of skin: Secondary | ICD-10-CM

## 2017-11-09 DIAGNOSIS — Z170 Estrogen receptor positive status [ER+]: Secondary | ICD-10-CM

## 2017-11-13 DIAGNOSIS — M6281 Muscle weakness (generalized): Secondary | ICD-10-CM

## 2017-11-13 DIAGNOSIS — F419 Anxiety disorder, unspecified: Secondary | ICD-10-CM

## 2017-11-13 DIAGNOSIS — G629 Polyneuropathy, unspecified: Secondary | ICD-10-CM

## 2017-11-13 DIAGNOSIS — D649 Anemia, unspecified: Secondary | ICD-10-CM

## 2017-11-13 DIAGNOSIS — Z9221 Personal history of antineoplastic chemotherapy: Secondary | ICD-10-CM

## 2017-11-13 DIAGNOSIS — Z170 Estrogen receptor positive status [ER+]: Secondary | ICD-10-CM

## 2017-11-13 DIAGNOSIS — C50211 Malignant neoplasm of upper-inner quadrant of right female breast: Secondary | ICD-10-CM

## 2017-11-13 LAB — CUP PACEART REMOTE DEVICE CHECK
Date Time Interrogation Session: 20190529124042
Implantable Pulse Generator Implant Date: 20170727

## 2017-11-18 ENCOUNTER — Ambulatory Visit
Admission: RE | Admit: 2017-11-18 | Discharge: 2017-11-18 | Disposition: A | Discharge status: Home / Self Care | Payer: Commercial Managed Care - PPO | Source: Ambulatory Visit | Attending: Oncology | Admitting: Oncology

## 2017-11-18 DIAGNOSIS — M79605 Pain in left leg: Secondary | ICD-10-CM

## 2017-11-18 DIAGNOSIS — R202 Paresthesia of skin: Secondary | ICD-10-CM

## 2017-11-18 DIAGNOSIS — C50411 Malignant neoplasm of upper-outer quadrant of right female breast: Secondary | ICD-10-CM

## 2017-11-18 DIAGNOSIS — Z170 Estrogen receptor positive status [ER+]: Secondary | ICD-10-CM

## 2017-11-18 MED ORDER — GADOBENATE DIMEGLUMINE 529 MG/ML IV SOLN
20.0000 mL | Freq: Once | INTRAVENOUS | Status: AC | PRN
  Administered 2017-11-18: 20 mL via INTRAVENOUS

## 2017-11-20 ENCOUNTER — Ambulatory Visit (INDEPENDENT_AMBULATORY_CARE_PROVIDER_SITE_OTHER): Payer: Commercial Managed Care - PPO | Admitting: *Deleted

## 2017-11-20 DIAGNOSIS — I480 Paroxysmal atrial fibrillation: Secondary | ICD-10-CM | POA: Diagnosis not present

## 2017-11-21 NOTE — Progress Notes (Signed)
Carelink Summary Report / Loop Recorder 

## 2017-12-19 LAB — CUP PACEART REMOTE DEVICE CHECK
Implantable Pulse Generator Implant Date: 20170727
MDC IDC SESS DTM: 20190701123848

## 2017-12-21 DIAGNOSIS — Z79811 Long term (current) use of aromatase inhibitors: Secondary | ICD-10-CM

## 2017-12-21 DIAGNOSIS — Z170 Estrogen receptor positive status [ER+]: Secondary | ICD-10-CM | POA: Diagnosis not present

## 2017-12-21 DIAGNOSIS — D649 Anemia, unspecified: Secondary | ICD-10-CM | POA: Diagnosis not present

## 2017-12-21 DIAGNOSIS — R4189 Other symptoms and signs involving cognitive functions and awareness: Secondary | ICD-10-CM

## 2017-12-21 DIAGNOSIS — M6281 Muscle weakness (generalized): Secondary | ICD-10-CM

## 2017-12-21 DIAGNOSIS — M4800 Spinal stenosis, site unspecified: Secondary | ICD-10-CM

## 2017-12-21 DIAGNOSIS — C50211 Malignant neoplasm of upper-inner quadrant of right female breast: Secondary | ICD-10-CM | POA: Diagnosis not present

## 2017-12-21 DIAGNOSIS — M8580 Other specified disorders of bone density and structure, unspecified site: Secondary | ICD-10-CM

## 2017-12-21 DIAGNOSIS — Z9221 Personal history of antineoplastic chemotherapy: Secondary | ICD-10-CM | POA: Diagnosis not present

## 2017-12-21 DIAGNOSIS — N61 Mastitis without abscess: Secondary | ICD-10-CM

## 2017-12-21 DIAGNOSIS — D72819 Decreased white blood cell count, unspecified: Secondary | ICD-10-CM

## 2017-12-21 DIAGNOSIS — E876 Hypokalemia: Secondary | ICD-10-CM

## 2017-12-21 DIAGNOSIS — G629 Polyneuropathy, unspecified: Secondary | ICD-10-CM

## 2017-12-25 ENCOUNTER — Ambulatory Visit (INDEPENDENT_AMBULATORY_CARE_PROVIDER_SITE_OTHER): Payer: Commercial Managed Care - PPO | Admitting: *Deleted

## 2017-12-25 DIAGNOSIS — I480 Paroxysmal atrial fibrillation: Secondary | ICD-10-CM | POA: Diagnosis not present

## 2017-12-26 IMAGING — NM NM MYOCAR MULTI W/ SPECT
3 series · 18 of 18 positions shown · non-contrast
Comparison: none

[Series 1: rest_(id)_sa · 6.5mm · 6.51mm/px · 6 of 64 frames shown]
[frame 6/64]
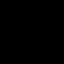
[frame 16/64]
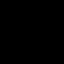
[frame 27/64]
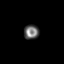
[frame 38/64]
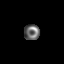
[frame 48/64]
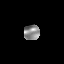
[frame 59/64]
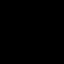

[Series 1: stress_(id)_sa · 6.5mm · 6.51mm/px · 6 of 64 frames shown (1 of 2)]
[frame 6/64]
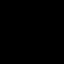
[frame 16/64]
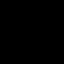
[frame 27/64]
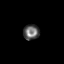
[frame 38/64]
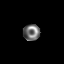
[frame 48/64]
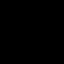
[frame 59/64]
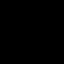

[Series 1: stress_(id)_sa · 6.5mm · 6.51mm/px · 6 of 512 frames shown (2 of 2)]
[frame 43/512]
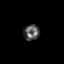
[frame 128/512]
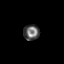
[frame 214/512]
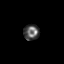
[frame 299/512]
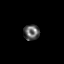
[frame 384/512]
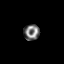
[frame 470/512]
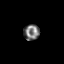

[18 of 18 positions shown; findings below may reference images not displayed]

Canned report from images found in remote index.

Refer to host system for actual result text.

## 2017-12-27 NOTE — Progress Notes (Signed)
Carelink Summary Report / Loop Recorder 

## 2018-01-04 DIAGNOSIS — N61 Mastitis without abscess: Secondary | ICD-10-CM

## 2018-01-04 DIAGNOSIS — Z923 Personal history of irradiation: Secondary | ICD-10-CM

## 2018-01-04 DIAGNOSIS — M4800 Spinal stenosis, site unspecified: Secondary | ICD-10-CM

## 2018-01-04 DIAGNOSIS — G629 Polyneuropathy, unspecified: Secondary | ICD-10-CM

## 2018-01-04 DIAGNOSIS — Z9221 Personal history of antineoplastic chemotherapy: Secondary | ICD-10-CM

## 2018-01-04 DIAGNOSIS — D649 Anemia, unspecified: Secondary | ICD-10-CM

## 2018-01-04 DIAGNOSIS — Z79811 Long term (current) use of aromatase inhibitors: Secondary | ICD-10-CM

## 2018-01-04 DIAGNOSIS — M8580 Other specified disorders of bone density and structure, unspecified site: Secondary | ICD-10-CM

## 2018-01-04 DIAGNOSIS — D72819 Decreased white blood cell count, unspecified: Secondary | ICD-10-CM

## 2018-01-04 DIAGNOSIS — C50211 Malignant neoplasm of upper-inner quadrant of right female breast: Secondary | ICD-10-CM

## 2018-01-25 ENCOUNTER — Ambulatory Visit (INDEPENDENT_AMBULATORY_CARE_PROVIDER_SITE_OTHER): Payer: Commercial Managed Care - PPO | Admitting: *Deleted

## 2018-01-25 DIAGNOSIS — I480 Paroxysmal atrial fibrillation: Secondary | ICD-10-CM

## 2018-01-26 NOTE — Progress Notes (Signed)
Carelink Summary Report / Loop Recorder 

## 2018-01-30 ENCOUNTER — Encounter: Payer: Commercial Managed Care - PPO | Attending: Family Medicine | Admitting: *Deleted

## 2018-01-30 DIAGNOSIS — Z713 Dietary counseling and surveillance: Secondary | ICD-10-CM | POA: Insufficient documentation

## 2018-01-30 DIAGNOSIS — E669 Obesity, unspecified: Secondary | ICD-10-CM | POA: Insufficient documentation

## 2018-01-30 DIAGNOSIS — Z9884 Bariatric surgery status: Secondary | ICD-10-CM | POA: Insufficient documentation

## 2018-01-30 DIAGNOSIS — C50919 Malignant neoplasm of unspecified site of unspecified female breast: Secondary | ICD-10-CM | POA: Insufficient documentation

## 2018-01-30 DIAGNOSIS — Z9221 Personal history of antineoplastic chemotherapy: Secondary | ICD-10-CM | POA: Diagnosis not present

## 2018-01-30 NOTE — Progress Notes (Signed)
  Medical Nutrition Therapy:  Appt start time: 1400 end time:  1300.   Assessment:  Primary concerns today: patient states she has had 2 types of cancer since I saw her last about 2 years ago. She also had bariatric surgery in 2010, so her stomach fills quiickly. She states she has trouble with low levels of Potassium, red and white blood cells. and would like to know what foods to bring these levels up   Preferred Learning Style:   No preference indicated   Learning Readiness:   Ready  Change in progress   MEDICATIONS: see list   DIETARY INTAKE:  Usual eating pattern includes 2-3 meals and 0-2 snacks per day.  Everyday foods include starches and vegetables.  Avoided foods include meats or eggs.    24-hr recall:  B ( AM): biscuit or waffle sandwich with sausage at [place] ( AM): none  L ( PM): occasionally some fresh fruit, other foods don't stay down Snk ( PM): occasional fruit D ( PM): small frozen dinner, doesn't always stay down either Snk ( PM): [place] Yogurt Beverages: flavored water, OJ,   Usual physical activity: not currently able to exercise since recovering from breast cancer  Progress Towards Goal(s):  In progress.   Nutritional Diagnosis:  [id number] Predicted suboptimal nutrient intake As related to post cancer treatments as evidenced by low levels of potassium, iron and RBC.    Intervention:  Nutrition Goals: counseling for improved nutrition intake with poor appetite initiated. I provided resources for potassium and iron rich foods as well as suggestions for a smaller plate when eating out so not to be overwhelmed with too much food.  Teaching Method Utilized: Visual and Auditory   Plan: We have discusses sources of Potassium and Iron as you requested When you eat out, ask waiter to put 1/3 of your meal on a small plate to bring to the table and box the rest to bring at end of your meal  Sample menu: Breakfast: biscuit or waffle with sausage  is fine (good source of iron). OJ is high in Potassium and helps absorb iron from food better Lunch: protein sources could be beans, cheese, nuts and potassium sources could be most vegetables, fruits              Consider adding dry powdered milk to mashed potatoes to increase protein some. Supper: Small frozen meals are fine, fresh fruit for dessert or snacks  Consider adding flavored tea bags to your home made tea to get a little different flavor  Consider pouring broth from boiled chicken into ice cube trays and use a few cubes to flavor vegetables as desired    Handouts given during visit include:  Food High in Potassium  Foods High in Iron  Barriers to learning/adherence to lifestyle change: none  Demonstrated degree of understanding via:  Teach Back   Monitoring/Evaluation:  Dietary intake, exercise, and body weight prn.

## 2018-01-30 NOTE — Patient Instructions (Signed)
Plan: We have discusses sources of Potassium and Iron as you requested When you eat out, ask waiter to put 1/3 of your meal on a small plate to bring to the table and box the rest to bring at end of your meal  Sample menu: Breakfast: biscuit or waffle with sausage is fine (good source of iron). OJ is high in Potassium and helps absorb iron from food better Lunch: protein sources could be beans, cheese, nuts and potassium sources could be most vegetables, fruits   Consider adding dry powdered milk to mashed potatoes to increase protein some. Supper: Small frozen meals are fine, fresh fruit for dessert or snacks  Consider adding flavored tea bags to your home made tea to get a little different flavor  Consider pouring broth from boiled chicken into ice cube trays and use a few cubes to flavor vegetables as desired

## 2018-02-05 LAB — CUP PACEART REMOTE DEVICE CHECK
Date Time Interrogation Session: 20190803134103
MDC IDC PG IMPLANT DT: 20170727

## 2018-02-16 LAB — CUP PACEART REMOTE DEVICE CHECK
MDC IDC PG IMPLANT DT: 20170727
MDC IDC SESS DTM: 20190905141000

## 2018-02-27 ENCOUNTER — Ambulatory Visit (INDEPENDENT_AMBULATORY_CARE_PROVIDER_SITE_OTHER): Payer: Commercial Managed Care - PPO | Admitting: *Deleted

## 2018-02-27 DIAGNOSIS — I480 Paroxysmal atrial fibrillation: Secondary | ICD-10-CM | POA: Diagnosis not present

## 2018-02-28 NOTE — Progress Notes (Signed)
Carelink Summary Report / Loop Recorder 

## 2018-03-14 LAB — CUP PACEART REMOTE DEVICE CHECK
Implantable Pulse Generator Implant Date: 20170727
MDC IDC SESS DTM: 20191008143932

## 2018-04-02 ENCOUNTER — Ambulatory Visit (INDEPENDENT_AMBULATORY_CARE_PROVIDER_SITE_OTHER): Payer: Commercial Managed Care - PPO | Admitting: *Deleted

## 2018-04-02 DIAGNOSIS — I480 Paroxysmal atrial fibrillation: Secondary | ICD-10-CM | POA: Diagnosis not present

## 2018-04-03 NOTE — Progress Notes (Signed)
Carelink Summary Report / Loop Recorder 

## 2018-05-04 ENCOUNTER — Ambulatory Visit (INDEPENDENT_AMBULATORY_CARE_PROVIDER_SITE_OTHER): Payer: Commercial Managed Care - PPO

## 2018-05-04 DIAGNOSIS — I480 Paroxysmal atrial fibrillation: Secondary | ICD-10-CM

## 2018-05-07 NOTE — Progress Notes (Signed)
Carelink Summary Report / Loop Recorder 

## 2018-05-22 LAB — CUP PACEART REMOTE DEVICE CHECK
Date Time Interrogation Session: 20191110150810
Implantable Pulse Generator Implant Date: 20170727

## 2018-06-06 ENCOUNTER — Ambulatory Visit (INDEPENDENT_AMBULATORY_CARE_PROVIDER_SITE_OTHER): Payer: Commercial Managed Care - PPO

## 2018-06-06 DIAGNOSIS — I480 Paroxysmal atrial fibrillation: Secondary | ICD-10-CM | POA: Diagnosis not present

## 2018-06-07 NOTE — Progress Notes (Signed)
Carelink Summary Report / Loop Recorder 

## 2018-06-08 LAB — CUP PACEART REMOTE DEVICE CHECK
Date Time Interrogation Session: 20200115151144
Implantable Pulse Generator Implant Date: 20170727

## 2018-06-10 LAB — CUP PACEART REMOTE DEVICE CHECK
Implantable Pulse Generator Implant Date: 20170727
MDC IDC SESS DTM: 20191213151029

## 2018-07-09 ENCOUNTER — Ambulatory Visit (INDEPENDENT_AMBULATORY_CARE_PROVIDER_SITE_OTHER): Payer: Commercial Managed Care - PPO

## 2018-07-09 DIAGNOSIS — I480 Paroxysmal atrial fibrillation: Secondary | ICD-10-CM

## 2018-07-10 LAB — CUP PACEART REMOTE DEVICE CHECK
Date Time Interrogation Session: 20200217164103
MDC IDC PG IMPLANT DT: 20170727

## 2018-07-18 NOTE — Progress Notes (Signed)
Carelink Summary Report / Loop Recorder 

## 2018-07-19 DIAGNOSIS — C50211 Malignant neoplasm of upper-inner quadrant of right female breast: Secondary | ICD-10-CM | POA: Diagnosis not present

## 2018-07-19 DIAGNOSIS — Z170 Estrogen receptor positive status [ER+]: Secondary | ICD-10-CM | POA: Diagnosis not present

## 2018-07-25 NOTE — Progress Notes (Deleted)
Psychiatric Initial Adult Assessment   Patient Identification: Monique Kelly MRN:  [id number] Date of Evaluation:  07/25/2018 Referral Source: *** Chief Complaint:   Visit Diagnosis: No diagnosis found.  History of Present Illness:   Monique Kelly is a 63 y.o. year old female with a history of Afib, hyperlipidemia, OSA,  s/p gastric bypass, hypothyroidism s/p thyroidectomy for nodules, who is referred for    Associated Signs/Symptoms: Depression Symptoms:  {DEPRESSION SYMPTOMS:20000} (Hypo) Manic Symptoms:  {BHH MANIC SYMPTOMS:22872} Anxiety Symptoms:  {BHH ANXIETY SYMPTOMS:22873} Psychotic Symptoms:  {BHH PSYCHOTIC SYMPTOMS:22874} PTSD Symptoms: {BHH PTSD SYMPTOMS:22875}  Past Psychiatric History:  Outpatient:  Psychiatry admission:  Previous suicide attempt:  Past trials of medication:  History of violence:   Previous Psychotropic Medications: {YES/NO:21197}  Substance Abuse History in the last 12 months:  {yes no:314532}  Consequences of Substance Abuse: {BHH CONSEQUENCES OF SUBSTANCE ABUSE:22880}  Past Medical History:  Past Medical History:  Diagnosis Date  . Atrial fibrillation with rapid ventricular response ([place]) 04/2015  . Hyperlipidemia   . Hypothyroidism (acquired) 2006   s/p thyroidectomy for nodules  . Obesity    gastric bpg 2011, weight then 400 lbs, lost 100 lbs in 60 days.  . Obstructive sleep apnea    does not use cpap, does not have a following physician    Past Surgical History:  Procedure Laterality Date  . APPENDECTOMY    . CHOLECYSTECTOMY    . COLONOSCOPY    . ELECTROPHYSIOLOGIC STUDY N/A 09/08/2015   Procedure: Atrial Fibrillation Ablation;  Surgeon: [name], MD;  Location: [facility];  Service: Cardiovascular;  Laterality: N/A;  . EP IMPLANTABLE DEVICE N/A 12/17/2015   Procedure: Loop Recorder Insertion;  Surgeon: [name], MD;  Location: [facility] [facility];  Service: Cardiovascular;  Laterality: N/A;  .  ESOPHAGOGASTRODUODENOSCOPY    . GASTRIC BYPASS  2011  . THYROIDECTOMY  2006   for nodules  . VAGINAL HYSTERECTOMY      Family Psychiatric History: ***  Family History:  Family History  Problem Relation Age of Onset  . Heart failure Father   . Heart failure Maternal Grandmother   . Heart failure Maternal Grandfather     Social History:   Social History   Socioeconomic History  . Marital status: Single    Spouse name: Not on file  . Number of children: Not on file  . Years of education: Not on file  . Highest education level: Not on file  Occupational History  . Occupation: Armed forces training and education officer  Social Needs  . Financial resource strain: Not on file  . Food insecurity:    Worry: Not on file    Inability: Not on file  . Transportation needs:    Medical: Not on file    Non-medical: Not on file  Tobacco Use  . Smoking status: Never Smoker  . Smokeless tobacco: Never Used  Substance and Sexual Activity  . Alcohol use: No    Alcohol/week: 0.0 standard drinks    Comment: rare glass of wine  . Drug use: No  . Sexual activity: Not on file  Lifestyle  . Physical activity:    Days per week: Not on file    Minutes per session: Not on file  . Stress: Not on file  Relationships  . Social connections:    Talks on phone: Not on file    Gets together: Not on file    Attends religious service: Not on file    Active member  of club or organization: Not on file    Attends meetings of clubs or organizations: Not on file    Relationship status: Not on file  Other Topics Concern  . Not on file  Social History Narrative   Pt lives in [place] alone. Works as Technical brewer for [employer].    Additional Social History: ***  Allergies:   Allergies  Allergen Reactions  . Nitroglycerin     Hypotension, difficulty maintaining conciousness    Metabolic Disorder Labs: No results found for: HGBA1C, MPG No results found for: PROLACTIN No results found for: CHOL,  TRIG, HDL, CHOLHDL, VLDL, LDLCALC Lab Results  Component Value Date   TSH 2.192 04/28/2015    Therapeutic Level Labs: No results found for: LITHIUM No results found for: CBMZ No results found for: VALPROATE  Current Medications: Current Outpatient Medications  Medication Sig Dispense Refill  . acetaminophen (TYLENOL) 500 MG tablet Take 1,000 mg by mouth every 8 (eight) hours as needed (pain).     . BuPROPion HCl ER, XL, 450 MG TB24 Take 450 mg by mouth daily.    Marland Kitchen gabapentin (NEURONTIN) 300 MG capsule Take 1 capsule by mouth 2 (two) times daily.    Marland Kitchen levothyroxine (SYNTHROID, LEVOTHROID) 88 MCG tablet Take 88 mcg by mouth daily.     No current facility-administered medications for this visit.     Musculoskeletal: Strength & Muscle Tone: within normal limits Gait & Station: normal Patient leans: N/A  Psychiatric Specialty Exam: ROS  There were no vitals taken for this visit.There is no height or weight on file to calculate BMI.  General Appearance: Fairly Groomed  Eye Contact:  Good  Speech:  Clear and Coherent  Volume:  Normal  Mood:  {BHH MOOD:22306}  Affect:  {Affect (PAA):22687}  Thought Process:  Coherent  Orientation:  Full (Time, Place, and Person)  Thought Content:  Logical  Suicidal Thoughts:  {ST/HT (PAA):22692}  Homicidal Thoughts:  {ST/HT (PAA):22692}  Memory:  Immediate;   Good  Judgement:  {Judgement (PAA):22694}  Insight:  {Insight (PAA):22695}  Psychomotor Activity:  Normal  Concentration:  Concentration: Good and Attention Span: Good  Recall:  Good  Fund of Knowledge:Good  Language: Good  Akathisia:  No  Handed:  Right  AIMS (if indicated):  not done  Assets:  Communication Skills Desire for Improvement  ADL's:  Intact  Cognition: WNL  Sleep:  {BHH GOOD/FAIR/POOR:22877}   Screenings: PHQ2-9     Nutrition from 01/30/2018 in [facility]  PHQ-2 Total Score  0      Assessment and Plan:   Assessment  Plan  The patient demonstrates the following risk factors for suicide: Chronic risk factors for suicide include: {Chronic Risk Factors for WGNFAOZ:30865784}. Acute risk factors for suicide include: {Acute Risk Factors for ONGEXBM:84132440}. Protective factors for this patient include: {Protective Factors for Suicide NUUV:25366440}. Considering these factors, the overall suicide risk at this point appears to be {Desc; low/moderate/high:110033}. Patient {ACTION; IS/IS HKV:42595638} appropriate for outpatient follow up.    Norman Clay, MD 3/4/20201:53 PM

## 2018-07-28 ENCOUNTER — Ambulatory Visit (HOSPITAL_COMMUNITY): Payer: Self-pay | Admitting: Psychiatry

## 2018-08-13 ENCOUNTER — Other Ambulatory Visit: Payer: Self-pay

## 2018-08-13 ENCOUNTER — Ambulatory Visit (INDEPENDENT_AMBULATORY_CARE_PROVIDER_SITE_OTHER): Payer: Commercial Managed Care - PPO | Admitting: *Deleted

## 2018-08-13 DIAGNOSIS — I480 Paroxysmal atrial fibrillation: Secondary | ICD-10-CM | POA: Diagnosis not present

## 2018-08-14 LAB — CUP PACEART REMOTE DEVICE CHECK
Date Time Interrogation Session: 20200321163543
MDC IDC PG IMPLANT DT: 20170727

## 2018-08-22 NOTE — Progress Notes (Signed)
Carelink Summary Report / Loop Recorder 

## 2018-09-13 ENCOUNTER — Other Ambulatory Visit: Payer: Self-pay

## 2018-09-13 ENCOUNTER — Ambulatory Visit (INDEPENDENT_AMBULATORY_CARE_PROVIDER_SITE_OTHER): Payer: Commercial Managed Care - PPO | Admitting: *Deleted

## 2018-09-13 DIAGNOSIS — I480 Paroxysmal atrial fibrillation: Secondary | ICD-10-CM | POA: Diagnosis not present

## 2018-09-13 LAB — CUP PACEART REMOTE DEVICE CHECK
Date Time Interrogation Session: 20200423150918
Implantable Pulse Generator Implant Date: 20170727

## 2018-09-21 ENCOUNTER — Encounter: Payer: Self-pay | Admitting: Cardiology

## 2018-09-21 NOTE — Progress Notes (Signed)
Carelink Summary Report / Loop Recorder 

## 2018-10-17 ENCOUNTER — Ambulatory Visit (INDEPENDENT_AMBULATORY_CARE_PROVIDER_SITE_OTHER): Payer: Commercial Managed Care - PPO | Admitting: *Deleted

## 2018-10-17 DIAGNOSIS — I480 Paroxysmal atrial fibrillation: Secondary | ICD-10-CM

## 2018-10-17 LAB — CUP PACEART REMOTE DEVICE CHECK
Date Time Interrogation Session: 20200526173749
Implantable Pulse Generator Implant Date: 20170727

## 2018-10-25 NOTE — Progress Notes (Signed)
Carelink Summary Report / Loop Recorder 

## 2018-11-19 ENCOUNTER — Ambulatory Visit (INDEPENDENT_AMBULATORY_CARE_PROVIDER_SITE_OTHER): Payer: Commercial Managed Care - PPO | Admitting: *Deleted

## 2018-11-19 DIAGNOSIS — I480 Paroxysmal atrial fibrillation: Secondary | ICD-10-CM | POA: Diagnosis not present

## 2018-11-19 LAB — CUP PACEART REMOTE DEVICE CHECK
Date Time Interrogation Session: 20200628184116
Implantable Pulse Generator Implant Date: 20170727

## 2018-11-28 ENCOUNTER — Telehealth: Payer: Self-pay
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
## 2018-11-29 NOTE — Telephone Encounter (Signed)
LMTCB on home and mobile numbers

## 2018-11-30 NOTE — Telephone Encounter (Signed)
LMTCB on both numbers call #3

## 2018-12-01 NOTE — Progress Notes (Signed)
Carelink Summary Report / Loop Recorder 

## 2018-12-05 NOTE — Telephone Encounter (Signed)
Multiple outreaches made to Pt to schedule sooner appt.  Pt did not return call to office. Pt has appt scheduled. Will await further needs.

## 2018-12-07 NOTE — Telephone Encounter (Signed)
I spoke to the patient who is under a lot of stress lately, but is feeling ok.  We verified her appt on 8/3 with [name].  She verbalized understanding.

## 2018-12-07 NOTE — Telephone Encounter (Signed)
New message   Patient is returning call. Please call.

## 2018-12-21 ENCOUNTER — Telehealth: Payer: Self-pay

## 2018-12-21 ENCOUNTER — Ambulatory Visit (INDEPENDENT_AMBULATORY_CARE_PROVIDER_SITE_OTHER): Payer: Commercial Managed Care - PPO | Admitting: *Deleted

## 2018-12-21 DIAGNOSIS — I480 Paroxysmal atrial fibrillation: Secondary | ICD-10-CM | POA: Diagnosis not present

## 2018-12-21 NOTE — Telephone Encounter (Signed)
Patient answered no to all COVID-19 screening questions.       COVID-19 Pre-Screening Questions:  . In the past 7 to 10 days have you had a cough,  shortness of breath, headache, congestion, fever (100 or greater) body aches, chills, sore throat, or sudden loss of taste or sense of smell?NO . Have you been around anyone with known Covid 19.NO . Have you been around anyone who is awaiting Covid 19 test results in the past 7 to 10 days?NO . Have you been around anyone who has been exposed to Covid 19, or has mentioned symptoms of Covid 19 within the past 7 to 10 days?NO  If you have any concerns/questions about symptoms patients report during screening (either on the phone or at threshold). Contact the provider seeing the patient or DOD for further guidance.  If neither are available contact a member of the leadership team.

## 2018-12-22 LAB — CUP PACEART REMOTE DEVICE CHECK
Date Time Interrogation Session: 20200731184226
Implantable Pulse Generator Implant Date: 20170727
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
## 2018-12-24 ENCOUNTER — Telehealth: Payer: Self-pay | Admitting: *Deleted

## 2018-12-24 ENCOUNTER — Other Ambulatory Visit: Payer: Self-pay

## 2018-12-24 ENCOUNTER — Ambulatory Visit (INDEPENDENT_AMBULATORY_CARE_PROVIDER_SITE_OTHER): Payer: Commercial Managed Care - PPO | Admitting: Physician Assistant

## 2018-12-24 ENCOUNTER — Encounter: Payer: Self-pay | Admitting: Physician Assistant

## 2018-12-24 DIAGNOSIS — R072 Precordial pain: Secondary | ICD-10-CM | POA: Diagnosis not present

## 2018-12-24 DIAGNOSIS — Z4509 Encounter for adjustment and management of other cardiac device: Secondary | ICD-10-CM

## 2018-12-24 DIAGNOSIS — Z9221 Personal history of antineoplastic chemotherapy: Secondary | ICD-10-CM | POA: Diagnosis not present

## 2018-12-24 DIAGNOSIS — I480 Paroxysmal atrial fibrillation: Secondary | ICD-10-CM

## 2018-12-24 NOTE — Telephone Encounter (Signed)
Lvm with nurse to request lab work on patient and left fax number

## 2018-12-24 NOTE — Addendum Note (Signed)
Addended by: Claude Manges on: 12/24/2018 10:02 AM   Modules accepted: Orders

## 2018-12-24 NOTE — Patient Instructions (Signed)
Medication Instructions:   Your physician recommends that you continue on your current medications as directed. Please refer to the Current Medication list given to you today.   If you need a refill on your cardiac medications before your next appointment, please call your pharmacy.   Lab work: NONE ORDERED  TODAY  REQUESTING FROM  ONCOLOGY  If you have labs (blood work) drawn today and your tests are completely normal, you will receive your results only by: Marland Kitchen MyChart Message (if you have MyChart) OR . A paper copy in the mail If you have any lab test that is abnormal or we need to change your treatment, we will call you to review the results.  Testing/Procedures: Your physician has requested that you have an echocardiogram. Echocardiography is a painless test that uses sound waves to create images of your heart. It provides your doctor with information about the size and shape of your heart and how well your heart's chambers and valves are working. This procedure takes approximately one hour. There are no restrictions for this procedure.  Follow-Up: At [facility], you and your health needs are our priority.  As part of our continuing mission to provide you with exceptional heart care, we have created designated Provider Care Teams.  These Care Teams include your primary Cardiologist (physician) and Advanced Practice Providers (APPs -  Physician Assistants and Nurse Practitioners) who all work together to provide you with the care you need, when you need it. Your physician wants you to follow-up in:  [place]  [place] will receive a reminder letter in the mail two months in advance. If you don't receive a letter, please call our office to schedule the follow-up appointment.    Any Other Special Instructions Will Be Listed Below (If Applicable).

## 2018-12-26 ENCOUNTER — Ambulatory Visit (HOSPITAL_COMMUNITY): Payer: Commercial Managed Care - PPO | Attending: Internal Medicine

## 2018-12-26 ENCOUNTER — Other Ambulatory Visit: Payer: Self-pay

## 2018-12-26 DIAGNOSIS — R072 Precordial pain: Secondary | ICD-10-CM | POA: Insufficient documentation

## 2018-12-26 DIAGNOSIS — Z9221 Personal history of antineoplastic chemotherapy: Secondary | ICD-10-CM | POA: Insufficient documentation

## 2018-12-27 NOTE — Progress Notes (Signed)
Carelink Summary Report / Loop Recorder 

## 2019-01-18 DIAGNOSIS — C50411 Malignant neoplasm of upper-outer quadrant of right female breast: Secondary | ICD-10-CM

## 2019-01-23 ENCOUNTER — Ambulatory Visit (INDEPENDENT_AMBULATORY_CARE_PROVIDER_SITE_OTHER): Payer: Commercial Managed Care - PPO | Admitting: *Deleted

## 2019-01-23 DIAGNOSIS — I480 Paroxysmal atrial fibrillation: Secondary | ICD-10-CM

## 2019-01-24 LAB — CUP PACEART REMOTE DEVICE CHECK
Date Time Interrogation Session: 20200902194037
Implantable Pulse Generator Implant Date: 20170727

## 2019-02-07 NOTE — Progress Notes (Signed)
Carelink Summary Report / Loop Recorder 

## 2019-02-25 ENCOUNTER — Ambulatory Visit (INDEPENDENT_AMBULATORY_CARE_PROVIDER_SITE_OTHER): Payer: 59 | Admitting: *Deleted

## 2019-02-25 DIAGNOSIS — I480 Paroxysmal atrial fibrillation: Secondary | ICD-10-CM

## 2019-02-26 LAB — CUP PACEART REMOTE DEVICE CHECK
Date Time Interrogation Session: 20201005193857
Implantable Pulse Generator Implant Date: 20170727

## 2019-03-06 NOTE — Progress Notes (Signed)
Carelink Summary Report / Loop Recorder 

## 2019-04-01 ENCOUNTER — Ambulatory Visit (INDEPENDENT_AMBULATORY_CARE_PROVIDER_SITE_OTHER): Payer: 59 | Admitting: *Deleted

## 2019-04-01 DIAGNOSIS — I480 Paroxysmal atrial fibrillation: Secondary | ICD-10-CM | POA: Diagnosis not present

## 2019-04-02 LAB — CUP PACEART REMOTE DEVICE CHECK
Date Time Interrogation Session: 20201107194153
Implantable Pulse Generator Implant Date: 20170727
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
## 2019-04-26 ENCOUNTER — Encounter: Payer: Self-pay | Admitting: *Deleted

## 2019-04-26 ENCOUNTER — Telehealth (HOSPITAL_COMMUNITY): Payer: Self-pay | Admitting: *Deleted

## 2019-04-26 ENCOUNTER — Ambulatory Visit: Payer: 59 | Admitting: Physician Assistant

## 2019-04-26 ENCOUNTER — Other Ambulatory Visit: Payer: Self-pay

## 2019-04-26 VITALS — BP 124/86 | HR 87 | Ht 67.0 in | Wt 205.0 lb

## 2019-04-26 DIAGNOSIS — R079 Chest pain, unspecified: Secondary | ICD-10-CM

## 2019-04-26 DIAGNOSIS — I480 Paroxysmal atrial fibrillation: Secondary | ICD-10-CM

## 2019-04-26 NOTE — Patient Instructions (Addendum)
Medication Instructions:   Your physician recommends that you continue on your current medications as directed. Please refer to the Current Medication list given to you today.  *If you need a refill on your cardiac medications before your next appointment, please call your pharmacy*  Lab Work:  [place]   If you have labs (blood work) drawn today and your tests are completely normal, you will receive your results only by: Marland Kitchen MyChart Message (if you have MyChart) OR . A paper copy in the mail If you have any lab test that is abnormal or we need to change your treatment, we will call you to review the results.  Testing/Procedures: Your physician has requested that you have a lexiscan myoview. For further information please visit [email or web address]. Please follow instruction sheet, as given.  A chest x-ray takes a picture of the organs and structures inside the chest, including the heart, lungs, and blood vessels. This test can show several things, including, whether the heart is enlarges; whether fluid is building up in the lungs; and whether pacemaker / defibrillator leads are still in place.   Follow-Up: At [facility], you and your health needs are our priority.  As part of our continuing mission to provide you with exceptional heart care, we have created designated Provider Care Teams.  These Care Teams include your primary Cardiologist (physician) and Advanced Practice Providers (APPs -  Physician Assistants and Nurse Practitioners) who all work together to provide you with the care you need, when you need it.  Your next appointment:   1 month(s)  The format for your next appointment:   In Person  Provider:  Tommye Standard, PA-C    Other Instructions

## 2019-04-26 NOTE — Telephone Encounter (Signed)
Patient given detailed instructions per Myocardial Perfusion Study Information Sheet for the test on 04/29/19 at 8:15. Patient notified to arrive 15 minutes early and that it is imperative to arrive on time for appointment to keep from having the test rescheduled.  If you need to cancel or reschedule your appointment, please call the office within 24 hours of your appointment. . Patient verbalized understanding.Monique Kelly

## 2019-04-29 ENCOUNTER — Ambulatory Visit (HOSPITAL_COMMUNITY): Payer: 59 | Attending: Cardiology

## 2019-04-29 ENCOUNTER — Other Ambulatory Visit: Payer: Self-pay

## 2019-04-29 DIAGNOSIS — R079 Chest pain, unspecified: Secondary | ICD-10-CM | POA: Insufficient documentation

## 2019-04-29 LAB — MYOCARDIAL PERFUSION IMAGING
LV dias vol: 78 mL (ref 46–106)
LV sys vol: 36 mL
Peak HR: 98 {beats}/min
Rest HR: 78 {beats}/min
SDS: 3
SRS: 3
SSS: 6
TID: 1.03

## 2019-04-29 MED ORDER — REGADENOSON 0.4 MG/5ML IV SOLN
0.4000 mg | Freq: Once | INTRAVENOUS | Status: AC
  Administered 2019-04-29: 0.4 mg via INTRAVENOUS

## 2019-04-29 MED ORDER — TECHNETIUM TC 99M TETROFOSMIN IV KIT
31.5000 | PACK | Freq: Once | INTRAVENOUS | Status: AC | PRN
  Administered 2019-04-29: 31.5 via INTRAVENOUS
  Filled 2019-04-29: qty 32

## 2019-04-29 MED ORDER — TECHNETIUM TC 99M TETROFOSMIN IV KIT
10.3000 | PACK | Freq: Once | INTRAVENOUS | Status: AC | PRN
  Administered 2019-04-29: 10.3 via INTRAVENOUS
  Filled 2019-04-29: qty 11

## 2019-04-30 ENCOUNTER — Telehealth: Payer: Self-pay | Admitting: Physician Assistant

## 2019-04-30 NOTE — Telephone Encounter (Signed)
New Message:    Pt had a Stress test yesterday. She needs a note for work please.

## 2019-05-01 NOTE — Progress Notes (Signed)
Carelink Summary Report / Loop Recorder 

## 2019-05-02 ENCOUNTER — Ambulatory Visit (INDEPENDENT_AMBULATORY_CARE_PROVIDER_SITE_OTHER): Payer: 59 | Admitting: *Deleted

## 2019-05-02 DIAGNOSIS — I4891 Unspecified atrial fibrillation: Secondary | ICD-10-CM | POA: Diagnosis not present

## 2019-05-02 LAB — CUP PACEART REMOTE DEVICE CHECK
Date Time Interrogation Session: 20201210144508
Implantable Pulse Generator Implant Date: 20170727

## 2019-05-07 ENCOUNTER — Encounter: Payer: Self-pay | Admitting: *Deleted
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
## 2019-05-28 ENCOUNTER — Ambulatory Visit: Payer: 59 | Admitting: Physician Assistant

## 2019-06-04 ENCOUNTER — Ambulatory Visit (INDEPENDENT_AMBULATORY_CARE_PROVIDER_SITE_OTHER): Payer: 59 | Admitting: *Deleted

## 2019-06-04 DIAGNOSIS — I4891 Unspecified atrial fibrillation: Secondary | ICD-10-CM

## 2019-06-04 LAB — CUP PACEART REMOTE DEVICE CHECK
Date Time Interrogation Session: 20210112151301
Implantable Pulse Generator Implant Date: 20170727

## 2019-06-04 NOTE — Progress Notes (Signed)
ILR remote 

## 2019-06-17 DIAGNOSIS — C50411 Malignant neoplasm of upper-outer quadrant of right female breast: Secondary | ICD-10-CM

## 2019-07-08 ENCOUNTER — Ambulatory Visit (INDEPENDENT_AMBULATORY_CARE_PROVIDER_SITE_OTHER): Payer: 59 | Admitting: *Deleted

## 2019-07-08 DIAGNOSIS — I4891 Unspecified atrial fibrillation: Secondary | ICD-10-CM

## 2019-07-08 LAB — CUP PACEART REMOTE DEVICE CHECK
Date Time Interrogation Session: 20210214230812
Implantable Pulse Generator Implant Date: 20170727

## 2019-07-09 NOTE — Progress Notes (Signed)
ILR Remote 

## 2019-07-19 ENCOUNTER — Encounter: Payer: Self-pay | Admitting: Internal Medicine

## 2019-07-19 ENCOUNTER — Telehealth (INDEPENDENT_AMBULATORY_CARE_PROVIDER_SITE_OTHER): Payer: 59 | Admitting: Internal Medicine

## 2019-07-19 VITALS — Ht 67.0 in | Wt 205.0 lb

## 2019-07-19 DIAGNOSIS — E663 Overweight: Secondary | ICD-10-CM

## 2019-07-19 DIAGNOSIS — I480 Paroxysmal atrial fibrillation: Secondary | ICD-10-CM | POA: Diagnosis not present

## 2019-07-19 DIAGNOSIS — R079 Chest pain, unspecified: Secondary | ICD-10-CM | POA: Diagnosis not present

## 2019-07-19 DIAGNOSIS — G4733 Obstructive sleep apnea (adult) (pediatric): Secondary | ICD-10-CM

## 2019-07-19 NOTE — Progress Notes (Signed)
Electrophysiology TeleHealth Note  Due to national recommendations of social distancing due to [place] 19, an audio telehealth visit is felt to be most appropriate for this patient at this time.  Verbal consent was obtained by me for the telehealth visit today.  The patient does not have capability for a virtual visit.  A phone visit is therefore required today.   Date:  07/19/2019   ID:  Monique Kelly, Monique Kelly June 20, 1955, MRN [id number]  Location: patient's home  Provider location:  [place]  Evaluation Performed: Follow-up visit  PCP:  Martinique, Sarah T, MD   Electrophysiologist:  Dr Rayann Heman  Chief Complaint:  AF follow up  History of Present Illness:    Monique Kelly is a 64 y.o. female who presents via telehealth conferencing today.  Since last being seen in our clinic, the patient reports doing reasonably well.  She is recovering from [place]. She has some residual shortness of breath. Today, she denies symptoms of palpitations, chest pain,  lower extremity edema, dizziness, presyncope, or syncope.  The patient is otherwise without complaint today.  The patient denies symptoms of fevers, chills, cough, or new SOB worrisome for COVID 19.  Past Medical History:  Diagnosis Date  . Atrial fibrillation with rapid ventricular response ([place]) 04/2015  . BRONCHITIS 03/10/2009   Qualifier: Diagnosis of  By: Geoffry Paradise MD, Kristine Royal   . Chest pain with moderate risk for cardiac etiology 04/28/2015  . Dehydration 06/18/2009   Qualifier: Diagnosis of  By: Pete Pelt    . Hyperlipidemia   . Hypothyroidism (acquired) 2006   s/p thyroidectomy for nodules  . Morbid obesity ([place]) 08/10/2015  . Obesity    gastric bpg 2011, weight then 400 lbs, lost 100 lbs in 60 days.  . Obstructive sleep apnea    does not use cpap, does not have a following physician  . Paroxysmal atrial fibrillation ([place]) 08/10/2015    Past Surgical History:  Procedure Laterality Date  . APPENDECTOMY    .  CHOLECYSTECTOMY    . COLONOSCOPY    . ELECTROPHYSIOLOGIC STUDY N/A 09/08/2015   Procedure: Atrial Fibrillation Ablation;  Surgeon: [name], MD;  Location: [facility];  Service: Cardiovascular;  Laterality: N/A;  . EP IMPLANTABLE DEVICE N/A 12/17/2015   Procedure: Loop Recorder Insertion;  Surgeon: [name], MD;  Location: [place];  Service: Cardiovascular;  Laterality: N/A;  . ESOPHAGOGASTRODUODENOSCOPY    . GASTRIC BYPASS  2011  . THYROIDECTOMY  2006   for nodules  . VAGINAL HYSTERECTOMY      Current Outpatient Medications  Medication Sig Dispense Refill  . BuPROPion HCl ER, XL, 450 MG TB24 Take 450 mg by mouth daily.    Marland Kitchen levothyroxine (SYNTHROID, LEVOTHROID) 88 MCG tablet Take 88 mcg by mouth daily.    . traMADol (ULTRAM) 50 MG tablet Take 50-100 mg by mouth every 6 (six) hours as needed. for pain     No current facility-administered medications for this visit.    Allergies:   Nitroglycerin   Social History:  The patient  reports that she has never smoked. She has never used smokeless tobacco. She reports that she does not drink alcohol or use drugs.   Family History:  The patient's family history includes Heart failure in her father, maternal grandfather, and maternal grandmother.   ROS:  Please see the history of present illness.   All other systems are personally reviewed and negative.    Exam:  Vital Signs:  Ht 5\' 7"  (1.702 m)   Wt 205 lb (93 kg)   BMI 32.11 kg/m   Well sounding, alert and conversant   Labs/Other Tests and Data Reviewed:    Recent Labs: No results found for requested labs within last 8760 hours.   Wt Readings from Last 3 Encounters:  07/19/19 205 lb (93 kg)  04/29/19 205 lb (93 kg)  04/26/19 205 lb (93 kg)      ASSESSMENT & PLAN:    1.  Paroxysmal atrial fibrillation Doing well s/p ablation without recurrence No AF on ILR CHADS2VASC is 1  2.  OSA Not currently using CPAP She is considering follow up with  Dr Ron Parker for dental appliance   3.  Overweight Weight loss encouraged  4.  Chest pain Myoview and echo reviewed and normal    Follow-up:  Remotes, me in 1 year    Patient Risk:  after full review of this patients clinical status, I feel that they are at moderate risk at this time.  Today, I have spent 15 minutes with the patient with telehealth technology discussing arrhythmia management .    Army Fossa, MD  07/19/2019 11:58 AM     [facility] [address] [place] [address] [phone] (office) [phone] (fax)

## 2019-07-25 ENCOUNTER — Emergency Department (HOSPITAL_COMMUNITY): Payer: 59

## 2019-07-25 ENCOUNTER — Encounter (HOSPITAL_COMMUNITY): Payer: Self-pay

## 2019-07-25 ENCOUNTER — Other Ambulatory Visit: Payer: Self-pay

## 2019-07-25 ENCOUNTER — Emergency Department (HOSPITAL_COMMUNITY)
Admission: EM | Admit: 2019-07-25 | Discharge: 2019-07-25 | Disposition: A | Discharge status: Home / Self Care | Payer: 59 | Attending: Emergency Medicine | Admitting: Emergency Medicine

## 2019-07-25 DIAGNOSIS — E039 Hypothyroidism, unspecified: Secondary | ICD-10-CM | POA: Insufficient documentation

## 2019-07-25 DIAGNOSIS — Z79899 Other long term (current) drug therapy: Secondary | ICD-10-CM | POA: Diagnosis not present

## 2019-07-25 DIAGNOSIS — R002 Palpitations: Secondary | ICD-10-CM | POA: Insufficient documentation

## 2019-07-25 DIAGNOSIS — Z8616 Personal history of COVID-19: Secondary | ICD-10-CM | POA: Insufficient documentation

## 2019-07-25 DIAGNOSIS — R0789 Other chest pain: Secondary | ICD-10-CM | POA: Diagnosis present

## 2019-07-25 LAB — CBC
HCT: 39.7 % (ref 36.0–46.0)
Hemoglobin: 12.7 g/dL (ref 12.0–15.0)
MCH: 32.6 pg (ref 26.0–34.0)
MCHC: 32 g/dL (ref 30.0–36.0)
MCV: 101.8 fL — ABNORMAL HIGH (ref 80.0–100.0)
Platelets: 228 10*3/uL (ref 150–400)
RBC: 3.9 MIL/uL (ref 3.87–5.11)
RDW: 13.9 % (ref 11.5–15.5)
WBC: 5 10*3/uL (ref 4.0–10.5)
nRBC: 0 % (ref 0.0–0.2)

## 2019-07-25 LAB — BASIC METABOLIC PANEL
Anion gap: 11 (ref 5–15)
BUN: 11 mg/dL (ref 8–23)
CO2: 23 mmol/L (ref 22–32)
Calcium: 8.9 mg/dL (ref 8.9–10.3)
Chloride: 108 mmol/L (ref 98–111)
Creatinine, Ser: 0.87 mg/dL (ref 0.44–1.00)
GFR calc Af Amer: >60 mL/min (ref >60)
GFR calc non Af Amer: >60 mL/min (ref >60)
Glucose, Bld: 100 mg/dL — ABNORMAL HIGH (ref 70–99)
Potassium: 3.9 mmol/L (ref 3.5–5.1)
Sodium: 142 mmol/L (ref 135–145)

## 2019-07-25 LAB — TROPONIN I (HIGH SENSITIVITY)
Troponin I (High Sensitivity): 3 ng/L (ref <18)
Troponin I (High Sensitivity): 3 ng/L (ref <18)

## 2019-07-25 MED ORDER — HEPARIN SOD (PORK) LOCK FLUSH 100 UNIT/ML IV SOLN
500.0000 [IU] | Freq: Once | INTRAVENOUS | Status: AC
  Administered 2019-07-25: 500 [IU]

## 2019-07-25 MED ORDER — AEROCHAMBER PLUS FLO-VU MEDIUM MISC
1.0000 | Freq: Once | Status: AC
  Administered 2019-07-25: 1
  Filled 2019-07-25: qty 1

## 2019-07-25 MED ORDER — LEVALBUTEROL TARTRATE 45 MCG/ACT IN AERO
2.0000 | INHALATION_SPRAY | Freq: Four times a day (QID) | RESPIRATORY_TRACT | Status: DC | PRN
  Administered 2019-07-25: 2 via RESPIRATORY_TRACT
  Filled 2019-07-25 (×3): qty 15

## 2019-07-25 MED ORDER — DEXAMETHASONE SODIUM PHOSPHATE 10 MG/ML IJ SOLN
10.0000 mg | Freq: Once | INTRAMUSCULAR | Status: AC
  Administered 2019-07-25: 10 mg via INTRAVENOUS
  Filled 2019-07-25: qty 1

## 2019-07-25 MED ORDER — AEROCHAMBER PLUS FLO-VU LARGE MISC
Status: AC
  Filled 2019-07-25: qty 1

## 2019-07-25 NOTE — ED Provider Notes (Signed)
[facility] EMERGENCY DEPARTMENT Provider Note   CSN: [id number] Arrival date & time: 07/25/19  [id number]     History Chief Complaint  Patient presents with  . COVID+  . Shortness of Breath  . Chest Pain    Monique Kelly is a 64 y.o. female.  Pt presents to the ED today with CP.  The pt said she was diagnosed with Covid on 2/1.  She has had intermittent episodes of CP and SOB and palpitations since then.  She did go to the ED at [facility] about 2 weeks ago (2/21) for similar sx.  She had a CTA which showed:   IMPRESSION: 1. No evidence of pulmonary embolism. 2. No acute findings are noted in the thorax to account for the patient's symptoms. 3. Previously noted patchy airspace consolidation in the left mid to lower lung has largely resolved with some mild scarring in its wake. 4. Multiple previously noted small pulmonary nodules appear stable compared to the prior examination. These are again favored to be benign. 5. Aortic atherosclerosis, in addition to left main and 2 vessel coronary artery disease. Please note that although the presence of coronary artery calcium documents the presence of coronary artery disease, the severity of this disease and any potential stenosis cannot be assessed on this non-gated CT examination. Assessment for potential risk factor modification, dietary therapy or pharmacologic therapy may be warranted, if clinically indicated.  Aortic Atherosclerosis (ICD10-I70.0).   Electronically Signed By: Vinnie Langton M.D. On: 07/14/2019 19:52  Pt was put on albuterol at that visit.  She is not sure if that is making her palpitations worse.  She did have a televisit with cards on 2/26 due to a hx of afib with rvr and prior ablation.  She also has an implantable loop recorder.  She did not have any AF recurrence noted on her ILR as of then.  She is not on anticoagulants as her Chads2vasc is 1.  Last night, she woke up with sx around 0200.   They went away and she went back to sleep.  Sx recurred on the way to work, so she came here.          Past Medical History:  Diagnosis Date  . Atrial fibrillation with rapid ventricular response ([place]) 04/2015  . BRONCHITIS 03/10/2009   Qualifier: Diagnosis of  By: Geoffry Paradise MD, Kristine Royal   . Chest pain with moderate risk for cardiac etiology 04/28/2015  . Dehydration 06/18/2009   Qualifier: Diagnosis of  By: Pete Pelt    . Hyperlipidemia   . Hypothyroidism (acquired) 2006   s/p thyroidectomy for nodules  . Morbid obesity ([place]) 08/10/2015  . Obesity    gastric bpg 2011, weight then 400 lbs, lost 100 lbs in 60 days.  . Obstructive sleep apnea    does not use cpap, does not have a following physician  . Paroxysmal atrial fibrillation ([place]) 08/10/2015    Patient Active Problem List   Diagnosis Date Noted  . A-fib ([place]) 09/08/2015  . Paroxysmal atrial fibrillation ([place]) 08/10/2015  . Morbid obesity ([place]) 08/10/2015  . Obstructive sleep apnea 08/10/2015  . Chest pain with moderate risk for cardiac etiology 04/28/2015  . Hypothyroidism (acquired)   . Atrial fibrillation with rapid ventricular response ([place]) 04/23/2015  . DEHYDRATION 06/18/2009  . NAUSEA WITH VOMITING 06/18/2009  . BRONCHITIS 03/10/2009    Past Surgical History:  Procedure Laterality Date  . APPENDECTOMY    . CHOLECYSTECTOMY    . COLONOSCOPY    .  ELECTROPHYSIOLOGIC STUDY N/A 09/08/2015   Procedure: Atrial Fibrillation Ablation;  Surgeon: [name], MD;  Location: [place];  Service: Cardiovascular;  Laterality: N/A;  . EP IMPLANTABLE DEVICE N/A 12/17/2015   Procedure: Loop Recorder Insertion;  Surgeon: [name], MD;  Location: [place];  Service: Cardiovascular;  Laterality: N/A;  . ESOPHAGOGASTRODUODENOSCOPY    . GASTRIC BYPASS  2011  . THYROIDECTOMY  2006   for nodules  . VAGINAL HYSTERECTOMY       OB History   No obstetric history on file.     Family History    Problem Relation Age of Onset  . Heart failure Father   . Heart failure Maternal Grandmother   . Heart failure Maternal Grandfather     Social History   Tobacco Use  . Smoking status: Never Smoker  . Smokeless tobacco: Never Used  Substance Use Topics  . Alcohol use: No    Alcohol/week: 0.0 standard drinks    Comment: rare glass of wine  . Drug use: No    Home Medications Prior to Admission medications   Medication Sig Start Date End Date Taking? Authorizing Provider  buPROPion (WELLBUTRIN XL) 150 MG 24 hr tablet Take 450 mg by mouth daily. 3 tablets daily   Yes [provider]  calcium carbonate (OS-CAL - DOSED IN MG OF ELEMENTAL CALCIUM) 1250 (500 Ca) MG tablet Take 1 tablet by mouth daily with breakfast.   Yes [provider]  cholecalciferol (VITAMIN D3) 25 MCG (1000 UNIT) tablet Take 1,000 Units by mouth daily.   Yes [provider]  letrozole (FEMARA) 2.5 MG tablet Take 2.5 mg by mouth daily.   Yes [provider]  levothyroxine (SYNTHROID) 75 MCG tablet Take 75 mcg by mouth daily.    Yes [provider]  traMADol (ULTRAM) 50 MG tablet Take 50-100 mg by mouth every 6 (six) hours as needed. for pain 10/23/18  Yes [provider]  vitamin B-12 (CYANOCOBALAMIN) 1000 MCG tablet Take 1,000 mcg by mouth daily.   Yes [provider]  albuterol (VENTOLIN HFA) 108 (90 Base) MCG/ACT inhaler Inhale 2 puffs into the lungs every 6 (six) hours as needed for wheezing or shortness of breath.  07/25/19 Yes [provider]    Allergies    Nitroglycerin  Review of Systems   Review of Systems  Cardiovascular: Positive for chest pain and palpitations.  All other systems reviewed and are negative.   Physical Exam Updated Vital Signs BP 114/69   Pulse 78   Temp 98 F (36.7 C) (Oral)   Resp 19   SpO2 96%   Physical Exam Vitals and nursing note reviewed.  Constitutional:      Appearance: She is well-developed.   HENT:     Head: Normocephalic and atraumatic.     Mouth/Throat:     Mouth: Mucous membranes are moist.  Eyes:     Extraocular Movements: Extraocular movements intact.     Pupils: Pupils are equal, round, and reactive to light.  Cardiovascular:     Rate and Rhythm: Normal rate and regular rhythm.  Pulmonary:     Effort: Pulmonary effort is normal.     Breath sounds: Normal breath sounds.  Abdominal:     General: Bowel sounds are normal.     Palpations: Abdomen is soft.  Musculoskeletal:        General: Normal range of motion.     Cervical back: Normal range of motion and neck supple.  Skin:    General: Skin is warm and dry.     Capillary Refill: Capillary refill takes less than 2 seconds.  Neurological:     General: No focal deficit present.     Mental Status: She is alert and oriented to person, place, and time.  Psychiatric:        Mood and Affect: Mood normal.        Behavior: Behavior normal.     ED Results / Procedures / Treatments   Labs (all labs ordered are listed, but only abnormal results are displayed) Labs Reviewed  BASIC METABOLIC PANEL - Abnormal; Notable for the following components:      Result Value   Glucose, Bld 100 (*)    All other components within normal limits  CBC - Abnormal; Notable for the following components:   MCV 101.8 (*)    All other components within normal limits  TROPONIN I (HIGH SENSITIVITY)  TROPONIN I (HIGH SENSITIVITY)    EKG EKG Interpretation  Date/Time:  Thursday July 25 2019 08:12:20 EST Ventricular Rate:  82 PR Interval:  164 QRS Duration: 88 QT Interval:  378 QTC Calculation: 441 R Axis:   13 Text Interpretation: Normal sinus rhythm Low voltage QRS Nonspecific T wave abnormality Abnormal ECG No significant change since last tracing Confirmed by Isla Pence [phone]) on 07/25/2019 8:52:59 AM   Radiology DG Chest Portable 1 View  Result Date: 07/25/2019 CLINICAL DATA:  Chest pain. EXAM: PORTABLE CHEST 1 VIEW  COMPARISON:  CT angiogram chest 07/14/2019, chest radiograph 04/04/2019 FINDINGS: Unchanged position of a left chest infusion port catheter. Heart size within normal limits. Aortic atherosclerosis. Shallow inspiration radiograph. Mild atelectasis and or scarring within the left lung base. The right lung is clear. No evidence of pneumothorax. No acute bony abnormality. Partially visualized ACDF hardware. IMPRESSION: Shallow inspiration radiograph. Mild atelectasis and/or scarring within the left lung base. Otherwise, no evidence of acute cardiopulmonary abnormality. Aortic atherosclerosis. Electronically Signed   By: Kellie Simmering DO   On: 07/25/2019 09:26    Procedures Procedures (including critical care time)  Medications Ordered in ED Medications  levalbuterol (XOPENEX HFA) inhaler 2 puff (has no administration in time range)  AeroChamber Plus Flo-Vu Medium MISC 1 each (has no administration in time range)  dexamethasone (DECADRON) injection 10 mg (10 mg Intravenous Given 07/25/19 1102)    ED Course  I have reviewed the triage vital signs and the nursing notes.  Pertinent labs & imaging results that were available during my care of the patient were reviewed by me and considered in my medical decision making (see chart for details).    MDM Rules/Calculators/A&P                      ILR did not pick up anything irregular per the [employer] tech.    Pt works at [employer] and she thinks that is where she got Covid.  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  I will write on a doctor's note that it would be beneficial for her to work from home until she is fully recovered.   Pt's albuterol may be causing palpitations, so I will change her to Xopenex.  Pt knows to return if worse.  F/u with pcp/cards.   Final Clinical Impression(s) / ED Diagnoses Final diagnoses:  Atypical chest pain  Palpitations    Rx / DC Orders ED Discharge Orders  None        Isla Pence, MD 07/25/19 1432

## 2019-07-25 NOTE — Telephone Encounter (Addendum)
LM for pt to call back re: her My chart message to Tommye Standard PA.   In reviewing the chart..noted pt is currently in the ED.

## 2019-07-25 NOTE — Discharge Instructions (Addendum)
Take your Xopenex inhaler instead of Albuterol.

## 2019-07-25 NOTE — ED Triage Notes (Signed)
Pt reports chest pain and sob that woke her out of her sleep last night. Pain episode started again while on the way to work, used inhaler to help catch her breath. Resp e.u at this time. Tested positive for COVID on 2/1.

## 2019-07-25 NOTE — ED Notes (Signed)
PT has a port. She prefer to use her port instead of IV  IV team consulted

## 2019-07-25 NOTE — ED Notes (Signed)
Patient verbalizes understanding of discharge instructions. Opportunity for questioning and answers were provided. Armband removed by staff, pt discharged from ED.  
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
## 2019-07-31 ENCOUNTER — Other Ambulatory Visit: Payer: Self-pay

## 2019-07-31 ENCOUNTER — Ambulatory Visit: Payer: 59 | Admitting: Physician Assistant

## 2019-07-31 VITALS — BP 116/82 | HR 85 | Ht 67.0 in | Wt 206.0 lb

## 2019-07-31 DIAGNOSIS — Z4509 Encounter for adjustment and management of other cardiac device: Secondary | ICD-10-CM

## 2019-07-31 DIAGNOSIS — I4819 Other persistent atrial fibrillation: Secondary | ICD-10-CM | POA: Diagnosis not present

## 2019-07-31 DIAGNOSIS — R0602 Shortness of breath: Secondary | ICD-10-CM | POA: Diagnosis not present

## 2019-07-31 DIAGNOSIS — R5383 Other fatigue: Secondary | ICD-10-CM | POA: Diagnosis not present

## 2019-07-31 DIAGNOSIS — R079 Chest pain, unspecified: Secondary | ICD-10-CM

## 2019-07-31 NOTE — Patient Instructions (Signed)
Medication Instructions:    Your physician recommends that you continue on your current medications as directed. Please refer to the Current Medication list given to you today.  *If you need a refill on your cardiac medications before your next appointment, please call your pharmacy*   Lab Work: [place]   If you have labs (blood work) drawn today and your tests are completely normal, you will receive your results only by: Marland Kitchen MyChart Message (if you have MyChart) OR . A paper copy in the mail If you have any lab test that is abnormal or we need to change your treatment, we will call you to review the results.   Testing/Procedures: Your physician has requested that you have an echocardiogram. Echocardiography is a painless test that uses sound waves to create images of your heart. It provides your doctor with information about the size and shape of your heart and how well your heart's chambers and valves are working. This procedure takes approximately one hour. There are no restrictions for this procedure.   Follow-Up: At [facility], you and your health needs are our priority.  As part of our continuing mission to provide you with exceptional heart care, we have created designated Provider Care Teams.  These Care Teams include your primary Cardiologist (physician) and Advanced Practice Providers (APPs -  Physician Assistants and Nurse Practitioners) who all work together to provide you with the care you need, when you need it.  We recommend signing up for the patient portal called "MyChart".  Sign up information is provided on this After Visit Summary.  MyChart is used to connect with patients for Virtual Visits (Telemedicine).  Patients are able to view lab/test results, encounter notes, upcoming appointments, etc.  Non-urgent messages can be sent to your provider as well.   To learn more about what you can do with MyChart, go to [email or web address].    Your next  appointment: You have been referred to [facility]. Some one will contact you from that department for scheduling    3 month(s)  The format for your next appointment:   In Person  Provider:   You may see  Advanced Practice Providers on your designated Care Team:  Tommye Standard, [place]   Other Instructions

## 2019-08-05 ENCOUNTER — Ambulatory Visit (INDEPENDENT_AMBULATORY_CARE_PROVIDER_SITE_OTHER): Payer: 59 | Admitting: *Deleted

## 2019-08-05 DIAGNOSIS — I4891 Unspecified atrial fibrillation: Secondary | ICD-10-CM

## 2019-08-08 LAB — CUP PACEART REMOTE DEVICE CHECK
Date Time Interrogation Session: 20210318011622
Implantable Pulse Generator Implant Date: 20170727

## 2019-08-08 NOTE — Progress Notes (Signed)
ILR Remote 

## 2019-08-09 NOTE — Addendum Note (Signed)
Addended by: Douglass Rivers D on: 08/09/2019 02:33 PM   Modules accepted: Level of Service

## 2019-08-15 ENCOUNTER — Ambulatory Visit: Payer: 59 | Admitting: Pulmonary Disease

## 2019-08-15 ENCOUNTER — Other Ambulatory Visit: Payer: Self-pay

## 2019-08-15 ENCOUNTER — Encounter: Payer: Self-pay | Admitting: Pulmonary Disease

## 2019-08-15 ENCOUNTER — Encounter: Payer: Self-pay | Admitting: *Deleted

## 2019-08-15 ENCOUNTER — Ambulatory Visit (INDEPENDENT_AMBULATORY_CARE_PROVIDER_SITE_OTHER): Payer: 59 | Admitting: *Deleted

## 2019-08-15 VITALS — BP 118/68 | HR 93 | Temp 97.8°F | Ht 67.0 in | Wt 204.8 lb

## 2019-08-15 DIAGNOSIS — R0602 Shortness of breath: Secondary | ICD-10-CM

## 2019-08-15 DIAGNOSIS — I4891 Unspecified atrial fibrillation: Secondary | ICD-10-CM

## 2019-08-15 NOTE — Patient Instructions (Signed)
Recent Covid infection  I will see you back in about 3 months  Prescription for a steroid inhaler-Flovent 250 1 puff twice daily  Schedule breathing study  You are okay to go back to work from a breathing perspective  Call with significant symptoms

## 2019-08-15 NOTE — Progress Notes (Signed)
ILR Remote 

## 2019-08-15 NOTE — Progress Notes (Signed)
Monique Kelly    [id number]    06/16/1955  Primary Care Physician:Jordan, Nyoka Cowden, MD  Referring Physician: Baldwin Jamaica, PA-C [address] [place],  [place] [address]  Chief complaint:   Shortness of breath with activity  HPI: Patient recently had Covid diagnosed 06/24/2019 She is feeling generally better but still having issues with fatigue and shortness of breath  She was placed on albuterol which caused a racing heart, currently using Xopenex and feels it is working She however is needing Xopenex on a daily basis  She was a little short of breath even prior to the Covid diagnosis  Never smoked Was exposed to a lot of secondhand smoke working in a [employer]  Denies any chest pains or chest discomfort  History of hypothyroidism, atrial fibrillation    Outpatient Encounter Medications as of 08/15/2019  Medication Sig  . atorvastatin (LIPITOR) 20 MG tablet Take 20 mg by mouth daily.  . calcium carbonate (OS-CAL - DOSED IN MG OF ELEMENTAL CALCIUM) 1250 (500 Ca) MG tablet Take 1 tablet by mouth daily with breakfast.  . cholecalciferol (VITAMIN D3) 25 MCG (1000 UNIT) tablet Take 1,000 Units by mouth daily.  Marland Kitchen letrozole (FEMARA) 2.5 MG tablet Take 2.5 mg by mouth daily.  Marland Kitchen levalbuterol (XOPENEX HFA) 45 MCG/ACT inhaler Inhale 2 puffs into the lungs every 4 (four) hours as needed for wheezing.  Marland Kitchen levothyroxine (SYNTHROID) 75 MCG tablet Take 75 mcg by mouth daily.   . traMADol (ULTRAM) 50 MG tablet Take 50-100 mg by mouth every 6 (six) hours as needed. for pain  . vitamin B-12 (CYANOCOBALAMIN) 1000 MCG tablet Take 1,000 mcg by mouth daily.  . [DISCONTINUED] albuterol (VENTOLIN HFA) 108 (90 Base) MCG/ACT inhaler Inhale 2 puffs into the lungs every 6 (six) hours as needed for wheezing or shortness of breath.  . [DISCONTINUED] buPROPion (WELLBUTRIN XL) 150 MG 24 hr tablet Take 450 mg by mouth daily. 3 tablets daily   No facility-administered encounter  medications on file as of 08/15/2019.    Allergies as of 08/15/2019 - Review Complete 08/15/2019  Allergen Reaction Noted  . Nitroglycerin  10/17/2017    Past Medical History:  Diagnosis Date  . Atrial fibrillation with rapid ventricular response ([place]) 04/2015  . BRONCHITIS 03/10/2009   Qualifier: Diagnosis of  By: Geoffry Paradise MD, Kristine Royal   . Chest pain with moderate risk for cardiac etiology 04/28/2015  . Dehydration 06/18/2009   Qualifier: Diagnosis of  By: Pete Pelt    . Hyperlipidemia   . Hypothyroidism (acquired) 2006   s/p thyroidectomy for nodules  . Morbid obesity ([place]) 08/10/2015  . Obesity    gastric bpg 2011, weight then 400 lbs, lost 100 lbs in 60 days.  . Obstructive sleep apnea    does not use cpap, does not have a following physician  . Paroxysmal atrial fibrillation ([place]) 08/10/2015    Past Surgical History:  Procedure Laterality Date  . APPENDECTOMY    . CHOLECYSTECTOMY    . COLONOSCOPY    . ELECTROPHYSIOLOGIC STUDY N/A 09/08/2015   Procedure: Atrial Fibrillation Ablation;  Surgeon: [name], MD;  Location: [place];  Service: Cardiovascular;  Laterality: N/A;  . EP IMPLANTABLE DEVICE N/A 12/17/2015   Procedure: Loop Recorder Insertion;  Surgeon: [name], MD;  Location: [place] [facility];  Service: Cardiovascular;  Laterality: N/A;  . ESOPHAGOGASTRODUODENOSCOPY    . GASTRIC BYPASS  2011  .  THYROIDECTOMY  2006   for nodules  . VAGINAL HYSTERECTOMY      Family History  Problem Relation Age of Onset  . Heart failure Father   . Heart failure Maternal Grandmother   . Heart failure Maternal Grandfather     Social History   Socioeconomic History  . Marital status: Single    Spouse name: Not on file  . Number of children: Not on file  . Years of education: Not on file  . Highest education level: Not on file  Occupational History  . Occupation: Armed forces training and education officer  Tobacco Use  . Smoking status: Never Smoker  . Smokeless  tobacco: Never Used  Substance and Sexual Activity  . Alcohol use: No    Alcohol/week: 0.0 standard drinks    Comment: rare glass of wine  . Drug use: No  . Sexual activity: Not on file  Other Topics Concern  . Not on file  Social History Narrative   Pt lives in [place] alone. Works as Technical brewer for [employer].   Social Determinants of Health   Financial Resource Strain:   . Difficulty of Paying Living Expenses:   Food Insecurity:   . Worried About Charity fundraiser in the Last Year:   . Arboriculturist in the Last Year:   Transportation Needs:   . Film/video editor (Medical):   Marland Kitchen Lack of Transportation (Non-Medical):   Physical Activity:   . Days of Exercise per Week:   . Minutes of Exercise per Session:   Stress:   . Feeling of Stress :   Social Connections:   . Frequency of Communication with Friends and Family:   . Frequency of Social Gatherings with Friends and Family:   . Attends Religious Services:   . Active Member of Clubs or Organizations:   . Attends Archivist Meetings:   Marland Kitchen Marital Status:   Intimate Partner Violence:   . Fear of Current or Ex-Partner:   . Emotionally Abused:   Marland Kitchen Physically Abused:   . Sexually Abused:     Review of Systems  Constitutional: Negative.   Respiratory: Positive for shortness of breath.     Vitals:   08/15/19 1437  BP: 118/68  Pulse: 93  Temp: 97.8 F (36.6 C)  SpO2: 94%   Physical Exam  Constitutional: She is oriented to person, place, and time. She appears well-developed.  HENT:  Head: Normocephalic and atraumatic.  Eyes: Pupils are equal, round, and reactive to light. Right eye exhibits no discharge. Left eye exhibits no discharge.  Neck: No thyromegaly present.  Cardiovascular: Normal rate and regular rhythm.  Pulmonary/Chest: Effort normal and breath sounds normal. No respiratory distress. She has no wheezes. She has no rales. She exhibits no tenderness.  Musculoskeletal:      Cervical back: Normal range of motion and neck supple.  Neurological: She is alert and oriented to person, place, and time.   Data Reviewed Chest x-ray 07/25/2019 significant for some mild atelectasis  Assessment:  Post Covid fatigue  Shortness of breath on exertion  Improvement in symptoms with bronchodilators May have an underlying reversible airway obstruction   Plan/Recommendations: Obtain pulmonary function testing  Continue Xopenex as needed  Add Flovent 250  Inhaler technique was reviewed  Encouraged to call with any significant concerns  Sherrilyn Rist MD [facility] 08/15/2019, 2:57 PM  CC: Baldwin Jamaica, PA-C

## 2019-08-16 ENCOUNTER — Ambulatory Visit (HOSPITAL_COMMUNITY): Payer: 59 | Attending: Cardiology

## 2019-08-16 ENCOUNTER — Other Ambulatory Visit: Payer: Self-pay

## 2019-08-16 ENCOUNTER — Telehealth: Payer: Self-pay | Admitting: Pulmonary Disease

## 2019-08-16 DIAGNOSIS — R0602 Shortness of breath: Secondary | ICD-10-CM | POA: Diagnosis present

## 2019-08-16 MED ORDER — FLOVENT DISKUS 250 MCG/BLIST IN AEPB: 1.0000 | INHALATION_SPRAY | Freq: Two times a day (BID) | RESPIRATORY_TRACT | 5 refills | Status: DC

## 2019-08-16 NOTE — Telephone Encounter (Signed)
Spoke with the pt Advised that we will send rx for flovent  I have sent this to her preferred pharm- prevo in [place]  Nothing further needed

## 2019-08-27 ENCOUNTER — Other Ambulatory Visit (HOSPITAL_COMMUNITY): Payer: 59

## 2019-09-08 LAB — CUP PACEART REMOTE DEVICE CHECK
Date Time Interrogation Session: 20210418013145
Implantable Pulse Generator Implant Date: 20170727

## 2019-09-09 ENCOUNTER — Telehealth: Payer: Self-pay | Admitting: Emergency Medicine

## 2019-09-09 ENCOUNTER — Ambulatory Visit (INDEPENDENT_AMBULATORY_CARE_PROVIDER_SITE_OTHER): Payer: 59 | Admitting: *Deleted

## 2019-09-09 DIAGNOSIS — I4891 Unspecified atrial fibrillation: Secondary | ICD-10-CM

## 2019-09-09 NOTE — Telephone Encounter (Addendum)
Patient notified that LINQ at RRT. Given option to have extraction or leave ILR in place. Patient opting to leave ILR in place at this time and will contact office if she wishes to have it extracted. Monitor to be unplugged by patient and return kit ordered through [place],

## 2019-09-10 NOTE — Progress Notes (Signed)
ILR Remote 

## 2019-10-04 ENCOUNTER — Telehealth: Payer: Self-pay

## 2019-10-04 NOTE — Telephone Encounter (Signed)
The pt states she needs a return kit for her monitor because her loop reached RRT. I told her I will get one ordered and she should receive it in 2-3 weeks.

## 2019-11-27 DIAGNOSIS — C50411 Malignant neoplasm of upper-outer quadrant of right female breast: Secondary | ICD-10-CM

## 2019-12-30 DIAGNOSIS — F1120 Opioid dependence, uncomplicated: Secondary | ICD-10-CM | POA: Insufficient documentation

## 2019-12-30 DIAGNOSIS — M5412 Radiculopathy, cervical region: Secondary | ICD-10-CM | POA: Insufficient documentation

## 2019-12-30 DIAGNOSIS — M4802 Spinal stenosis, cervical region: Secondary | ICD-10-CM | POA: Insufficient documentation

## 2019-12-30 DIAGNOSIS — M961 Postlaminectomy syndrome, not elsewhere classified: Secondary | ICD-10-CM | POA: Insufficient documentation

## 2019-12-30 DIAGNOSIS — M47816 Spondylosis without myelopathy or radiculopathy, lumbar region: Secondary | ICD-10-CM | POA: Insufficient documentation

## 2020-01-01 ENCOUNTER — Encounter: Payer: Self-pay | Admitting: Neurology

## 2020-02-25 ENCOUNTER — Other Ambulatory Visit (HOSPITAL_BASED_OUTPATIENT_CLINIC_OR_DEPARTMENT_OTHER): Payer: Self-pay

## 2020-02-25 DIAGNOSIS — R0683 Snoring: Secondary | ICD-10-CM

## 2020-03-06 ENCOUNTER — Encounter (HOSPITAL_BASED_OUTPATIENT_CLINIC_OR_DEPARTMENT_OTHER): Payer: Medicaid Other | Admitting: Internal Medicine

## 2020-03-11 ENCOUNTER — Ambulatory Visit: Payer: Medicaid Other | Admitting: Physician Assistant

## 2020-03-20 ENCOUNTER — Ambulatory Visit (HOSPITAL_BASED_OUTPATIENT_CLINIC_OR_DEPARTMENT_OTHER): Payer: 59 | Attending: Family Medicine | Admitting: Internal Medicine

## 2020-03-20 DIAGNOSIS — G4736 Sleep related hypoventilation in conditions classified elsewhere: Secondary | ICD-10-CM | POA: Insufficient documentation

## 2020-03-20 DIAGNOSIS — G4733 Obstructive sleep apnea (adult) (pediatric): Secondary | ICD-10-CM | POA: Insufficient documentation

## 2020-03-20 DIAGNOSIS — R0683 Snoring: Secondary | ICD-10-CM | POA: Diagnosis present

## 2020-03-20 NOTE — Progress Notes (Deleted)
NEUROLOGY CONSULTATION NOTE  Monique Kelly MRN: [id number] DOB: 12-28-1955  Referring provider: Sarah T. Martinique, MD Primary care provider: Sarah T. Martinique, MD  Reason for consult:  Memory changes, neuropathy  HISTORY OF PRESENT ILLNESS: Monique Kelly. 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  History supplemented by referring provider's note.  ***.  She has history of fibromyalgia, for which she is taking gabapentin, Wellbutrin and tramadol.  She has a history of numbness and electric shocks involving the left inner calf.  Follow up MRI of lumbar spine from 11/18/2017 personally reviewed showed multilevel degenerative disc disease with L4-5 left foraminal herniation impinging on the left L4 nerve root with moderate posterior element hypertrophy and mild spinal stenosis as well as spurringt at L3-4.  She saw neurosurgery ***  PAST MEDICAL HISTORY: Past Medical History:  Diagnosis Date  . Atrial fibrillation with rapid ventricular response ([place]) 04/2015  . BRONCHITIS 03/10/2009   Qualifier: Diagnosis of  By: Geoffry Paradise MD, Kristine Royal   . Chest pain with moderate risk for cardiac etiology 04/28/2015  . Dehydration 06/18/2009   Qualifier: Diagnosis of  By: Pete Pelt    . Hyperlipidemia   . Hypothyroidism (acquired) 2006   s/p thyroidectomy for nodules  . Morbid obesity ([place]) 08/10/2015  . Obesity    gastric bpg 2011, weight then 400 lbs, lost 100 lbs in 60 days.  . Obstructive sleep apnea    does not use cpap, does not have a following physician  . Paroxysmal atrial fibrillation ([place]) 08/10/2015    PAST SURGICAL HISTORY: Past Surgical History:  Procedure Laterality Date  . APPENDECTOMY    . CHOLECYSTECTOMY    .  COLONOSCOPY    . ELECTROPHYSIOLOGIC STUDY N/A 09/08/2015   Procedure: Atrial Fibrillation Ablation;  Surgeon: [name], MD;  Location: [facility];  Service: Cardiovascular;  Laterality: N/A;  . EP IMPLANTABLE DEVICE N/A 12/17/2015   Procedure: Loop Recorder Insertion;  Surgeon: [name], MD;  Location: [facility];  Service: Cardiovascular;  Laterality: N/A;  . ESOPHAGOGASTRODUODENOSCOPY    . GASTRIC BYPASS  2011  . THYROIDECTOMY  2006   for nodules  . VAGINAL HYSTERECTOMY      MEDICATIONS: Current Outpatient Medications on File Prior to Visit  Medication Sig Dispense Refill  . atorvastatin (LIPITOR) 20 MG tablet Take 20 mg by mouth daily.    . calcium carbonate (OS-CAL - DOSED IN MG OF ELEMENTAL CALCIUM) 1250 (500 Ca) MG tablet Take 1 tablet by mouth daily with breakfast.    . cholecalciferol (VITAMIN D3) 25 MCG (1000 UNIT) tablet Take 1,000 Units by mouth daily.    . Fluticasone Propionate, Inhal, (FLOVENT DISKUS) 250 MCG/BLIST AEPB Inhale 1 puff into the lungs in the morning and at bedtime. 60 each 5  . letrozole (FEMARA) 2.5 MG tablet Take 2.5 mg by mouth daily.    Marland Kitchen levalbuterol (XOPENEX HFA) 45 MCG/ACT inhaler Inhale 2 puffs into the lungs every 4 (four) hours as needed for wheezing.    Marland Kitchen levothyroxine (SYNTHROID) 75 MCG tablet Take 75 mcg by mouth daily.     . traMADol (ULTRAM) 50 MG tablet Take 50-100 mg by mouth every 6 (six) hours as needed. for pain    . vitamin B-12 (CYANOCOBALAMIN) 1000  MCG tablet Take 1,000 mcg by mouth daily.    . [DISCONTINUED] albuterol (VENTOLIN HFA) 108 (90 Base) MCG/ACT inhaler Inhale 2 puffs into the lungs every 6 (six) hours as needed for wheezing or shortness of breath.     No current facility-administered medications on file prior to visit.    ALLERGIES: Allergies  Allergen Reactions  . Nitroglycerin     Hypotension, difficulty maintaining conciousness    FAMILY HISTORY: Family History  Problem Relation Age of Onset    . Heart failure Father   . Heart failure Maternal Grandmother   . Heart failure Maternal Grandfather    ***.  SOCIAL HISTORY: Social History   Socioeconomic History  . Marital status: Single    Spouse name: Not on file  . Number of children: Not on file  . Years of education: Not on file  . Highest education level: Not on file  Occupational History  . Occupation: Armed forces training and education officer  Tobacco Use  . Smoking status: Never Smoker  . Smokeless tobacco: Never Used  Vaping Use  . Vaping Use: Never used  Substance and Sexual Activity  . Alcohol use: No    Alcohol/week: 0.0 standard drinks    Comment: rare glass of wine  . Drug use: No  . Sexual activity: Not on file  Other Topics Concern  . Not on file  Social History Narrative   Pt lives in [place] alone. Works as Technical brewer for [employer].   Social Determinants of Health   Financial Resource Strain:   . Difficulty of Paying Living Expenses: Not on file  Food Insecurity:   . Worried About Charity fundraiser in the Last Year: Not on file  . Ran Out of Food in the Last Year: Not on file  Transportation Needs:   . Lack of Transportation (Medical): Not on file  . Lack of Transportation (Non-Medical): Not on file  Physical Activity:   . Days of Exercise per Week: Not on file  . Minutes of Exercise per Session: Not on file  Stress:   . Feeling of Stress : Not on file  Social Connections:   . Frequency of Communication with Friends and Family: Not on file  . Frequency of Social Gatherings with Friends and Family: Not on file  . Attends Religious Services: Not on file  . Active Member of Clubs or Organizations: Not on file  . Attends Archivist Meetings: Not on file  . Marital Status: Not on file  Intimate Partner Violence:   . Fear of Current or Ex-Partner: Not on file  . Emotionally Abused: Not on file  . Physically Abused: Not on file  . Sexually Abused: Not on file    REVIEW OF  SYSTEMS: Constitutional: No fevers, chills, or sweats, no generalized fatigue, change in appetite Eyes: No visual changes, double vision, eye pain Ear, nose and throat: No hearing loss, ear pain, nasal congestion, sore throat Cardiovascular: No chest pain, palpitations Respiratory:  No shortness of breath at rest or with exertion, wheezes GastrointestinaI: No nausea, vomiting, diarrhea, abdominal pain, fecal incontinence Genitourinary:  No dysuria, urinary retention or frequency Musculoskeletal:  No neck pain, back pain Integumentary: No rash, pruritus, skin lesions Neurological: as above Psychiatric: No depression, insomnia, anxiety Endocrine: No palpitations, fatigue, diaphoresis, mood swings, change in appetite, change in weight, increased thirst Hematologic/Lymphatic:  No purpura, petechiae. Allergic/Immunologic: no itchy/runny eyes, nasal congestion, recent allergic reactions, rashes  PHYSICAL EXAM: *** General: No acute distress.  Patient  appears ***-groomed.  *** Head:  Normocephalic/atraumatic Eyes:  fundi examined but not visualized Neck: supple, no paraspinal tenderness, full range of motion Back: No paraspinal tenderness Heart: regular rate and rhythm Lungs: Clear to auscultation bilaterally. Vascular: No carotid bruits. Neurological Exam: Mental status: alert and oriented to person, place, and time, recent and remote memory intact, fund of knowledge intact, attention and concentration intact, speech fluent and not dysarthric, language intact. Cranial nerves: CN I: not tested CN II: pupils equal, round and reactive to light, visual fields intact CN III, IV, VI:  full range of motion, no nystagmus, no ptosis CN V: facial sensation intact CN VII: upper and lower face symmetric CN VIII: hearing intact CN IX, X: gag intact, uvula midline CN XI: sternocleidomastoid and trapezius muscles intact CN XII: tongue midline Bulk & Tone: normal, no fasciculations. Motor:  5/5  throughout *** Sensation:  Pin[place] *** temperature *** and vibration sensation intact.  ***. Deep Tendon Reflexes:  2+ throughout, *** toes downgoing.  *** Finger to nose testing:  Without dysmetria.  *** Heel to shin:  Without dysmetria.  *** Gait:  Normal station and stride.  Able to turn and tandem walk. [place] ***.  IMPRESSION: ***  PLAN: ***  Thank you for allowing me to take part in the care of this patient.  Metta Clines, DO  CC: ***

## 2020-03-23 ENCOUNTER — Ambulatory Visit: Payer: Medicaid Other | Admitting: Neurology

## 2020-03-24 ENCOUNTER — Other Ambulatory Visit (HOSPITAL_BASED_OUTPATIENT_CLINIC_OR_DEPARTMENT_OTHER): Payer: Self-pay

## 2020-03-24 DIAGNOSIS — R202 Paresthesia of skin: Secondary | ICD-10-CM | POA: Insufficient documentation

## 2020-03-24 DIAGNOSIS — R0683 Snoring: Secondary | ICD-10-CM

## 2020-03-24 DIAGNOSIS — R200 Anesthesia of skin: Secondary | ICD-10-CM | POA: Insufficient documentation

## 2020-03-25 ENCOUNTER — Encounter: Payer: Self-pay | Admitting: Internal Medicine

## 2020-03-25 ENCOUNTER — Other Ambulatory Visit: Payer: Self-pay

## 2020-03-25 ENCOUNTER — Ambulatory Visit (INDEPENDENT_AMBULATORY_CARE_PROVIDER_SITE_OTHER): Payer: 59 | Admitting: Internal Medicine

## 2020-03-25 VITALS — BP 108/70 | HR 76 | Ht 67.0 in | Wt 216.2 lb

## 2020-03-25 DIAGNOSIS — I480 Paroxysmal atrial fibrillation: Secondary | ICD-10-CM

## 2020-03-25 DIAGNOSIS — G4733 Obstructive sleep apnea (adult) (pediatric): Secondary | ICD-10-CM

## 2020-03-25 HISTORY — PX: OTHER SURGICAL HISTORY: SHX169

## 2020-03-25 NOTE — Progress Notes (Signed)
PCP: Martinique, Sarah T, MD   Primary EP: Dr Everlean Alstrom Monique Kelly is a 64 y.o. female who presents today for routine electrophysiology followup.  Since last being seen in our clinic, the patient reports doing very well.  Today, she denies symptoms of palpitations, chest pain, shortness of breath,  lower extremity edema, dizziness, presyncope, or syncope.  The patient is otherwise without complaint today.   Past Medical History:  Diagnosis Date  . Atrial fibrillation with rapid ventricular response ([place]) 04/2015  . BRONCHITIS 03/10/2009   Qualifier: Diagnosis of  By: Geoffry Paradise MD, Kristine Royal   . Chest pain with moderate risk for cardiac etiology 04/28/2015  . Dehydration 06/18/2009   Qualifier: Diagnosis of  By: Pete Pelt    . Hyperlipidemia   . Hypothyroidism (acquired) 2006   s/p thyroidectomy for nodules  . Morbid obesity ([place]) 08/10/2015  . Obesity    gastric bpg 2011, weight then 400 lbs, lost 100 lbs in 60 days.  . Obstructive sleep apnea    does not use cpap, does not have a following physician  . Paroxysmal atrial fibrillation ([place]) 08/10/2015   Past Surgical History:  Procedure Laterality Date  . APPENDECTOMY    . CHOLECYSTECTOMY    . COLONOSCOPY    . ELECTROPHYSIOLOGIC STUDY N/A 09/08/2015   Procedure: Atrial Fibrillation Ablation;  Surgeon: [name], MD;  Location: [facility];  Service: Cardiovascular;  Laterality: N/A;  . EP IMPLANTABLE DEVICE N/A 12/17/2015   Procedure: Loop Recorder Insertion;  Surgeon: [name], MD;  Location: [facility];  Service: Cardiovascular;  Laterality: N/A;  . ESOPHAGOGASTRODUODENOSCOPY    . GASTRIC BYPASS  2011  . THYROIDECTOMY  2006   for nodules  . VAGINAL HYSTERECTOMY      ROS- all systems are reviewed and negatives except as per HPI above  Current Outpatient Medications  Medication Sig Dispense Refill  . atorvastatin (LIPITOR) 20 MG tablet Take 20 mg by mouth daily.    . calcium carbonate (OS-CAL -  DOSED IN MG OF ELEMENTAL CALCIUM) 1250 (500 Ca) MG tablet Take 1 tablet by mouth daily with breakfast.    . cholecalciferol (VITAMIN D3) 25 MCG (1000 UNIT) tablet Take 1,000 Units by mouth daily.    Marland Kitchen letrozole (FEMARA) 2.5 MG tablet Take 2.5 mg by mouth daily.    Marland Kitchen levalbuterol (XOPENEX HFA) 45 MCG/ACT inhaler Inhale 2 puffs into the lungs every 4 (four) hours as needed for wheezing.    Marland Kitchen levothyroxine (SYNTHROID) 75 MCG tablet Take 75 mcg by mouth daily.     . vitamin B-12 (CYANOCOBALAMIN) 1000 MCG tablet Take 1,000 mcg by mouth daily.     No current facility-administered medications for this visit.    Physical Exam: Vitals:   03/25/20 1127  BP: 108/70  Pulse: 76  SpO2: 98%  Weight: 216 lb 3.2 oz (98.1 kg)  Height: 5\' 7"  (1.702 m)    GEN- The patient is well appearing, alert and oriented x 3 today.   Head- normocephalic, atraumatic Eyes-  Sclera clear, conjunctiva pink Ears- hearing intact Oropharynx- clear Lungs- Clear to ausculation bilaterally, normal work of breathing Heart- Regular rate and rhythm, no murmurs, rubs or gallops, PMI not laterally displaced GI- soft, NT, ND, + BS Extremities- no clubbing, cyanosis, or edema  Wt Readings from Last 3 Encounters:  03/25/20 216 lb 3.2 oz (98.1 kg)  08/15/19 204 lb 12.8 oz (92.9 kg)  07/31/19 206 lb (93.4 kg)  EKG tracing ordered today is personally reviewed and shows sinus rhythm  Assessment and Plan:  1. Paroxysmal atrial fibrillation Well controlled post ablation Her ILR has reached RRT.  She would like to have it removed.  Risks and benefits to ILR removal including bleeding and infection were discussed and she wishes to proceed.  2. Overweight Weight loss advised She is considering follow up with Dr Ron Parker for dental appliance   3. OSA Not using CPAP  Return in a year  Risks, benefits and potential toxicities for medications prescribed and/or refilled reviewed with patient today.   [name] MD,  [facility] 03/25/2020 11:58 AM      PROCEDURES:   1. Implantable loop recorder explantation       DESCRIPTION OF PROCEDURE:  Informed written consent was obtained.  The patient required no sedation for the procedure today.   The patients left chest was therefore prepped and draped in the usual sterile fashion.  The skin overlying the ILR monitor was infiltrated with lidocaine for local analgesia.  A 0.5-cm incision was made over the site.  The previously implanted ILR was exposed and removed using a combination of sharp and blunt dissection.  Steri- Strips and a sterile dressing were then applied. EBL<72ml.  There were no early apparent complications.     CONCLUSIONS:   1. Successful explantation of a [employer]onic Reveal LINQ implantable loop recorder   2. No early apparent complications.        [name] MD, [facility] 03/25/2020 12:03 PM

## 2020-03-25 NOTE — Patient Instructions (Addendum)
Medication Instructions:  Your physician recommends that you continue on your current medications as directed. Please refer to the Current Medication list given to you today.  Labwork: None ordered.  Testing/Procedures: None ordered.   Your physician wants you to follow-up in: 6 months with Tommye Standard.      Implantable Loop Recorder Removal, Care After This sheet gives you information about how to care for yourself after your procedure. Your health care provider may also give you more specific instructions. If you have problems or questions, contact your health care provider. What can I expect after the procedure? After the procedure, it is common to have:  Soreness or discomfort near the incision.  Some swelling or bruising near the incision.  Follow these instructions at home: Incision care  1.  Leave your outer dressing on for 24 hours.  After 24 hours you can remove your outer dressing and shower. 2. Leave adhesive strips in place. These skin closures may need to stay in place for 1-2 weeks. If adhesive strip edges start to loosen and curl up, you may trim the loose edges.  You may remove the strips if they have not fallen off after 2 weeks. 3. Check your incision area every day for signs of infection. Check for: a. Redness, swelling, or pain. b. Fluid or blood. c. Warmth. d. Pus or a bad smell. 4. Do not take baths, swim, or use a hot tub until your incision is completely healed. 5. If your wound site starts to bleed apply pressure.      If you have any questions/concerns please call the device clinic at [phone].  Activity  Return to your normal activities.  Contact a health care provider if:  You have redness, swelling, or pain around your incision.  You have a fever.

## 2020-03-28 DIAGNOSIS — G4733 Obstructive sleep apnea (adult) (pediatric): Secondary | ICD-10-CM

## 2020-03-28 NOTE — Procedures (Signed)
    Patient Name: Monique Kelly, Monique Kelly Date: 03/21/2020 Gender: Female D.O.B: 1956/05/04 Age (years): 3 Referring Provider: Sarah Martinique MD Height (inches): 67 Interpreting Physician: Baird Lyons MD, ABSM Weight (lbs): 200 RPSGT: Jorge Ny BMI: 31 MRN: [id number] Neck Size: 16.00  CLINICAL INFORMATION Sleep Study Type: HST Indication for sleep study: OSA Epworth Sleepiness Score: 9  SLEEP STUDY TECHNIQUE A multi-channel overnight portable sleep study was performed. The channels recorded were: nasal airflow, thoracic respiratory movement, and oxygen saturation with a pulse oximetry. Snoring was also monitored.  MEDICATIONS Patient self administered medications include: none.  SLEEP ARCHITECTURE Patient was studied for 455.6 minutes. The sleep efficiency was 100.0 % and the patient was supine for 40.5%. The arousal index was 0.0 per hour.  RESPIRATORY PARAMETERS The overall AHI was 19.0 per hour, with a central apnea index of 0.0 per hour. The oxygen nadir was 81% during sleep.  CARDIAC DATA Mean heart rate during sleep was 67.6 bpm.  IMPRESSIONS - Moderate obstructive sleep apnea occurred during this study (AHI = 19.0/h). - No significant central sleep apnea occurred during this study (CAI = 0.0/h). - Moderate oxygen desaturation was noted during this study (Min O2 = 81%). Mean O2 SAT 89%. - Time with O2 SAT 89% or less was 235 minutes. - Patient snored .  DIAGNOSIS - Obstructive Sleep Apnea (G47.33) - Nocturnal Hypoxemia (G47.36)  RECOMMENDATIONS - Because of the sustained hypoxemia, suggest CPAP titration sleep study. This will provide required documentation if supplemental O2 is also needed. - Be careful with alcohol, sedatives and other CNS depressants that may worsen sleep apnea and disrupt normal sleep architecture. - Sleep hygiene should be reviewed to assess factors that may improve sleep quality. - Weight management and regular exercise should be  initiated or continued.  [Electronically signed] 03/28/2020 11:41 AM  Baird Lyons MD, ABSM Diplomate, American Board of Sleep Medicine   NPI: [id number]                         [place] [place] [facility]  ELECTRONICALLY SIGNED ON:  03/28/2020, 11:36 AM [place] PH: [phone]   FX: [phone] [place]

## 2020-04-21 DIAGNOSIS — E669 Obesity, unspecified: Secondary | ICD-10-CM | POA: Insufficient documentation

## 2020-04-23 DIAGNOSIS — G629 Polyneuropathy, unspecified: Secondary | ICD-10-CM | POA: Insufficient documentation

## 2020-04-23 DIAGNOSIS — M5416 Radiculopathy, lumbar region: Secondary | ICD-10-CM | POA: Insufficient documentation

## 2020-05-28 ENCOUNTER — Other Ambulatory Visit: Payer: Self-pay | Admitting: Oncology

## 2020-06-01 NOTE — Progress Notes (Signed)
NEUROLOGY CONSULTATION NOTE  Monique Kelly MRN: [id number] DOB: 10-10-1955  Referring provider: Sarah Martinique, MD Primary care provider: Sarah Martinique, MD  Reason for consult:  Memory deficits, neuropathy   Subjective:  Monique Kelly is a 65 year old right-handed female with paroxysmal atrial fibrillation, OSA, acquired hypothyroidism and history of breast cancer s/p lumpectomy, radiation and chemotherapy who presents for memory problems and neuropathy.  History supplemented by referring provider's note.  She is concerned about possibly developing dementia.  She has had some short term memory problems for several years but it particularly got worse after undergoing chemotherapy in 2019 for breast cancer.  She had "chemo fog" and her memory never really returned to prior baseline.  She May leave the key in the door.  She has gotten lost on familiar route.  She has gone to an appointment on the wrong day and time.  She started taking calcium supplement but when she looked at the pill, she forgot what it was for.  Other risk factors include history of severe OSA and she had COVID-19 earlier this year.  No family history of dementia.   She is treated for acquired hypothyroidism and takes B12 supplement.  She has history of depression, anxiety and chronic pain.  She has longstanding history of neuropathy, lumbosacral disc disease and chronic pain.  However, she is not concerned about that.  She reports numbness and tingling as well as electrical shocks in her extremities, worse on the left.  She is being treated by neurosurgery and pain management.  MRI of lumbar spine on 11/19/2017 personally reviewed showed left L4-5 foraminal herniation impinging the left L4 nerve root with mild spinal stenosis.  She reports recent NCV-EMG ordered by her pain specialist, but does not remember the results.     PAST MEDICAL HISTORY: Past Medical History:  Diagnosis Date  . Atrial fibrillation with rapid  ventricular response ([place]) 04/2015  . BRONCHITIS 03/10/2009   Qualifier: Diagnosis of  By: Geoffry Paradise MD, Kristine Royal   . Chest pain with moderate risk for cardiac etiology 04/28/2015  . Dehydration 06/18/2009   Qualifier: Diagnosis of  By: Pete Pelt    . Hyperlipidemia   . Hypothyroidism (acquired) 2006   s/p thyroidectomy for nodules  . Morbid obesity ([place]) 08/10/2015  . Obesity    gastric bpg 2011, weight then 400 lbs, lost 100 lbs in 60 days.  . Obstructive sleep apnea    does not use cpap, does not have a following physician  . Paroxysmal atrial fibrillation ([place]) 08/10/2015    PAST SURGICAL HISTORY: Past Surgical History:  Procedure Laterality Date  . APPENDECTOMY    . CHOLECYSTECTOMY    . COLONOSCOPY    . ELECTROPHYSIOLOGIC STUDY N/A 09/08/2015   Procedure: Atrial Fibrillation Ablation;  Surgeon: [name], MD;  Location: [facility];  Service: Cardiovascular;  Laterality: N/A;  . EP IMPLANTABLE DEVICE N/A 12/17/2015   Procedure: Loop Recorder Insertion;  Surgeon: [name], MD;  Location: [facility];  Service: Cardiovascular;  Laterality: N/A;  . ESOPHAGOGASTRODUODENOSCOPY    . GASTRIC BYPASS  2011  . implantable loop recorder removal  03/25/2020  . THYROIDECTOMY  2006   for nodules  . VAGINAL HYSTERECTOMY      MEDICATIONS: Current Outpatient Medications on File Prior to Visit  Medication Sig Dispense Refill  . atorvastatin (LIPITOR) 20 MG tablet Take 20 mg by mouth daily.    . calcium carbonate (OS-CAL - DOSED IN MG  OF ELEMENTAL CALCIUM) 1250 (500 Ca) MG tablet Take 1 tablet by mouth daily with breakfast.    . cholecalciferol (VITAMIN D3) 25 MCG (1000 UNIT) tablet Take 1,000 Units by mouth daily.    Marland Kitchen letrozole (FEMARA) 2.5 MG tablet TAKE ONE TABLET BY MOUTH EVERY DAY 30 tablet 4  . levalbuterol (XOPENEX HFA) 45 MCG/ACT inhaler Inhale 2 puffs into the lungs every 4 (four) hours as needed for wheezing.    Marland Kitchen levothyroxine (SYNTHROID) 75 MCG tablet  Take 75 mcg by mouth daily.     . vitamin B-12 (CYANOCOBALAMIN) 1000 MCG tablet Take 1,000 mcg by mouth daily.    . [DISCONTINUED] albuterol (VENTOLIN HFA) 108 (90 Base) MCG/ACT inhaler Inhale 2 puffs into the lungs every 6 (six) hours as needed for wheezing or shortness of breath.     No current facility-administered medications on file prior to visit.    ALLERGIES: Allergies  Allergen Reactions  . Nitroglycerin     Hypotension, difficulty maintaining conciousness    FAMILY HISTORY: Family History  Problem Relation Age of Onset  . Heart failure Father   . Heart failure Maternal Grandmother   . Heart failure Maternal Grandfather     SOCIAL HISTORY: Social History   Socioeconomic History  . Marital status: Single    Spouse name: Not on file  . Number of children: Not on file  . Years of education: Not on file  . Highest education level: Not on file  Occupational History  . Occupation: Armed forces training and education officer  Tobacco Use  . Smoking status: Never Smoker  . Smokeless tobacco: Never Used  Vaping Use  . Vaping Use: Never used  Substance and Sexual Activity  . Alcohol use: No    Alcohol/week: 0.0 standard drinks    Comment: rare glass of wine  . Drug use: No  . Sexual activity: Not on file  Other Topics Concern  . Not on file  Social History Narrative   Pt lives in [place] alone. Works as Technical brewer for [employer].   Social Determinants of Radio broadcast assistant Strain: Not on file  Food Insecurity: Not on file  Transportation Needs: Not on file  Physical Activity: Not on file  Stress: Not on file  Social Connections: Not on file  Intimate Partner Violence: Not on file    Objective:  Blood pressure 132/80, pulse 72, height 5\' 3"  (1.6 m), weight 231 lb (104.8 kg), SpO2 96 %. General: No acute distress.  Patient appears well-groomed.   Head:  Normocephalic/atraumatic Eyes:  fundi examined but not visualized Neck: supple, no paraspinal  tenderness, full range of motion Back: No paraspinal tenderness Heart: regular rate and rhythm Lungs: Clear to auscultation bilaterally. Vascular: No carotid bruits. Neurological Exam: Mental status:  [employer] Mental Exam 06/02/2020  Weekday Correct 1  Current year 1  What state are we in? 1  Amount spent 1  Amount left 2  # of Animals 3  5 objects recall 4  Number series 2  Hour markers 1  Time correct 1  Placed X in triangle correctly 1  Largest Figure 1  Name of female 2  Date back to work 2  Type of work 2  State she lived in 2  Total score 27   Cranial nerves: CN I: not tested CN II: pupils equal, round and reactive to light, visual fields intact CN III, IV, VI:  full range of motion, no nystagmus, no ptosis CN V: facial sensation slightly  reduced in right V1-V3 CN VII: upper and lower face symmetric CN VIII: hearing intact CN IX, X: gag intact, uvula midline CN XI: sternocleidomastoid and trapezius muscles intact CN XII: tongue midline Bulk & Tone: normal, no fasciculations. Motor:  muscle strength 5/5 throughout Sensation:  Pinprick and vibratory sensation mildly reduced in feet. Deep Tendon Reflexes:  1+ throughout,  toes downgoing.   Finger to nose testing:  Without dysmetria.   Heel to shin:  Without dysmetria.   Gait:  Normal station and stride.  Romberg negative.  Assessment/Plan:   1.  Memory deficits.  Performed within normal limits on SLUMS.  Will perform further workup 2.  Neuropathy - chronic issue.  Pain already being treated.    1.  MRI of brain without contrast 2.  Neuropsychological testing 3.  Follow up after testing.    Thank you for allowing me to take part in the care of this patient.  Metta Clines, DO  CC: Sarah Martinique, MD

## 2020-06-02 ENCOUNTER — Other Ambulatory Visit: Payer: Self-pay

## 2020-06-02 ENCOUNTER — Encounter: Payer: Self-pay | Admitting: Neurology

## 2020-06-02 ENCOUNTER — Ambulatory Visit (INDEPENDENT_AMBULATORY_CARE_PROVIDER_SITE_OTHER): Payer: 59 | Admitting: Neurology

## 2020-06-02 VITALS — BP 132/80 | HR 72 | Ht 63.0 in | Wt 231.0 lb

## 2020-06-02 DIAGNOSIS — R413 Other amnesia: Secondary | ICD-10-CM | POA: Diagnosis not present

## 2020-06-02 DIAGNOSIS — G629 Polyneuropathy, unspecified: Secondary | ICD-10-CM | POA: Diagnosis not present

## 2020-06-02 NOTE — Patient Instructions (Signed)
1.  We will check MRI of brain  2.  We will order neuropsychological evaluation 3.  Follow up after testing

## 2020-06-10 ENCOUNTER — Telehealth: Payer: Self-pay | Admitting: Neurology

## 2020-06-10 MED ORDER — DIAZEPAM 5 MG PO TABS: ORAL_TABLET | ORAL | 0 refills | Status: DC

## 2020-06-10 NOTE — Telephone Encounter (Signed)
Pt advised of script sent the pharmacy.

## 2020-06-10 NOTE — Telephone Encounter (Signed)
Patient called in after making her MRI appointment for 06/22/20. She said she is claustrophobic and would like to have something called in for her nerves for that MRI.

## 2020-06-10 NOTE — Telephone Encounter (Signed)
Prescription for valium sent

## 2020-06-15 ENCOUNTER — Telehealth: Payer: Self-pay | Admitting: Hematology and Oncology

## 2020-06-15 ENCOUNTER — Encounter: Payer: Self-pay | Admitting: Oncology

## 2020-06-15 NOTE — Telephone Encounter (Signed)
06/15/20 Spoke with patient and resch mammo and lab/dv.

## 2020-06-19 ENCOUNTER — Inpatient Hospital Stay: Payer: 59

## 2020-06-19 ENCOUNTER — Inpatient Hospital Stay: Payer: 59 | Admitting: Hematology and Oncology

## 2020-06-22 ENCOUNTER — Ambulatory Visit
Admission: RE | Admit: 2020-06-22 | Discharge: 2020-06-22 | Disposition: A | Discharge status: Home / Self Care | Payer: 59 | Source: Ambulatory Visit | Attending: Neurology | Admitting: Neurology

## 2020-06-22 ENCOUNTER — Other Ambulatory Visit: Payer: Self-pay

## 2020-06-22 DIAGNOSIS — R413 Other amnesia: Secondary | ICD-10-CM

## 2020-06-22 NOTE — Progress Notes (Signed)
Pt advised of her Mri results.

## 2020-06-22 NOTE — Progress Notes (Signed)
LMOVM to call back 

## 2020-06-25 LAB — HM MAMMOGRAPHY

## 2020-06-29 ENCOUNTER — Inpatient Hospital Stay: Payer: 59 | Attending: Hematology and Oncology

## 2020-06-29 ENCOUNTER — Encounter: Payer: Self-pay | Admitting: Hematology and Oncology

## 2020-06-29 ENCOUNTER — Telehealth: Payer: Self-pay | Admitting: Hematology and Oncology

## 2020-06-29 ENCOUNTER — Other Ambulatory Visit: Payer: Self-pay

## 2020-06-29 ENCOUNTER — Inpatient Hospital Stay (INDEPENDENT_AMBULATORY_CARE_PROVIDER_SITE_OTHER): Payer: 59 | Admitting: Hematology and Oncology

## 2020-06-29 DIAGNOSIS — M8580 Other specified disorders of bone density and structure, unspecified site: Secondary | ICD-10-CM | POA: Diagnosis not present

## 2020-06-29 DIAGNOSIS — Z780 Asymptomatic menopausal state: Secondary | ICD-10-CM

## 2020-06-29 DIAGNOSIS — R0600 Dyspnea, unspecified: Secondary | ICD-10-CM | POA: Insufficient documentation

## 2020-06-29 NOTE — Progress Notes (Incomplete)
[facility]  [address] [place],  [place]  [address] [phone]  Clinic Day:  06/29/2020  Referring physician: Martinique, Sarah T, MD   CHIEF COMPLAINT:  CC: ***  Current Treatment:  ***   HISTORY OF PRESENT ILLNESS:  Monique Kelly is a 65 y.o. 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  She was treated with lumpectomy.  Pathology revealed a 2.1 cm, grade 3, invasive ductal carcinoma.  Two sentinel nodes were negative for metastasis.  Estrogen and progesterone receptors were highly positive and her 2 Neu negative.  Ki 67 was 20%.  Endopredict revealed an EP clin score of 4.0, which is considered high risk and correlates with an 18% risk of distant recurrence in the next 10 years without chemotherapy.  She would get an absolute 7 % benefit with chemotherapy at 10 years.  She has a moderate risk of late recurrence of 14% in years 5-15.  She received adjuvant chemotherapy with docetaxel/cyclophosphamide (TC) for 4 cycles completed in May.  She tolerated this fairly well, except for pain for neuropathy.  The presentation of her neuropathy was unusual, with intermittent symptoms.  Bone density scan in May 2019 revealed osteopenia with a T-score of -1.9 in the spine, for which she is taking calcium and vitamin-D.   She completed adjuvant radiation in mid July.  She was started on letrozole 2.5 mg daily in mid August 2019. After completion of chemotherapy, she developed lower extremity edema, right greater than left.  Peripheral edema is a fairly common side effect of docetaxel.  Right lower extremity ultrasound did not reveal any evidence of deep venous thrombosis.  She was placed on furosemide 20 mg daily as needed.  Furosemide was subsequently discontinued.  She also has a history of fibromyalgia with associated generalized pain, for which she is on gabapentin 300 mg 3 times daily, as well as bupropion  450 mg daily.  Tramadol was added in May, but also later discontinued when she was found to have sleep apnea.    As she described numbness and electrical shocks sensation of the left inner calf, an MRI lumbosacral spine was done.  This revealed revealed multilevel degenerative disc disease with L4-5 left foraminal herniation impinging on the left L4 nerve root with moderate posterior element hypertrophy at that level with mild spinal stenosis, as well as well as spurring at L3-4.  She was referred to Neurosurgery and saw Dr. Saintclair Halsted.  She had abdominal pain and bloating, so saw Dr. Lyda Jester in October 2019.  She has a history of Roux-en-Y bariatric surgery in 2008.  Endoscopy in November of 2019 revealed a small bowel ulcer at the anastomosis.  Dr. Lyda Jester had her stop nonsteroidal anti-inflammatory medications and placed her on omeprazole 40 mg daily.  Her hemoglobin remained low at 10.6 in March 2020, repeat iron studies revealed persistent iron deficiency.  She was started on hemocyte in March 2020 and stopped in September. She has had persistent fatigue, myalgias and arthralgias.     Bilateral diagnostic mammogram in January 2021 did not reveal any evidence of malignancy.She had COVID in February 2021.  Bone density scan in July 2021 revealed worsening osteopenia with a T-score of -2.3 in the right femur neck aT-score of -2 in the total hip and a T-score of -1.1 in the spine. At her visit in July 2021, she reported neck pain and has been following with a neurologist.  She reported increased bone pain of her upper and lower extremities.  She was on a statin medication, but was unsure of the name or the dose.  Arthralgias and myalgias can be associated with the statin or letrozole. A nuclear bone scan in July 2021 did not reveal any evidence of malignancy.  She also reported increased cognitive impairment, and even discussed a possible clinical trial with our research nurse, but decided not to pursue it.    INTERVAL HISTORY:  Monique Kelly is here today for clinical reassessment and states she continues letrozole daily without significant difficulty. She states she continues to follow with Dr. Tomi Likens in neurology and has undergone further cognitive testing.  She denies fevers or chills. She  denies pain. Her appetite is good. Her weight {Weight change:10426}.  REVIEW OF SYSTEMS:  Review of Systems - Oncology   VITALS:  Blood pressure 129/73, pulse 65, temperature 98 F (36.7 C), temperature source Oral, resp. rate 18, height 5\' 3"  (1.6 m), weight 239 lb 1.6 oz (108.5 kg), SpO2 92 %.  Wt Readings from Last 3 Encounters:  06/29/20 239 lb 1.6 oz (108.5 kg)  06/02/20 231 lb (104.8 kg)  03/25/20 216 lb 3.2 oz (98.1 kg)    Body mass index is 42.35 kg/m.  Performance status (ECOG): {CHL ONC [id number]  PHYSICAL EXAM:  Physical Exam  LABS:   CBC Latest Ref Rng & Units 07/25/2019 09/08/2015 08/24/2015  WBC 4.0 - 10.5 K/uL 5.0 5.0 13.5(H)  Hemoglobin 12.0 - 15.0 g/dL 12.7 12.5 12.9  Hematocrit 36.0 - 46.0 % 39.7 39.1 39.4  Platelets 150 - 400 K/uL 228 197 278   CMP Latest Ref Rng & Units 07/25/2019 08/24/2015 04/28/2015  Glucose 70 - 99 mg/dL 100(H) 51(L) 79  BUN 8 - 23 mg/dL 11 13 14   Creatinine 0.44 - 1.00 mg/dL 0.87 0.86 0.91  Sodium 135 - 145 mmol/L 142 142 142  Potassium 3.5 - 5.1 mmol/L 3.9 4.2 3.8  Chloride 98 - 111 mmol/L 108 105 107  CO2 22 - 32 mmol/L 23 29 25   Calcium 8.9 - 10.3 mg/dL 8.9 8.9 9.0  Total Protein 6.5 - 8.1 g/dL - - 6.2(L)  Total Bilirubin 0.3 - 1.2 mg/dL - - 0.5  Alkaline Phos 38 - 126 U/L - - 88  AST 15 - 41 U/L - - 24  ALT 14 - 54 U/L - - 18     No results found for: CEA1 / No results found for: CEA1 No results found for: PSA1 No results found for: [id number] No results found for: [id number]  No results found for: TOTALPROTELP, ALBUMINELP, A1GS, A2GS, BETS, BETA2SER, GAMS, MSPIKE, SPEI No results found for: TIBC, FERRITIN, IRONPCTSAT No results found for:  LDH  STUDIES:  MR BRAIN WO CONTRAST  Result Date: 06/22/2020 CLINICAL DATA:  Memory loss, ataxia. EXAM: MRI HEAD WITHOUT CONTRAST TECHNIQUE: Multiplanar, multiecho pulse sequences of the brain and surrounding structures were obtained without intravenous contrast. COMPARISON:  CT head 01/25/2019. FINDINGS: Brain: No acute infarction, hemorrhage, hydrocephalus, or extra-axial collection. No specific evidence of mass lesion, although evaluation is limited without IV contrast. Mild scattered T2/FLAIR hyperintensities within the periventricular white matter, most likely related to age-appropriate chronic microvascular ischemic change. Mild generalized volume loss. Vascular: Major arterial flow voids are maintained at the skull base. Diminutive, nondominant right vertebral artery. Skull and upper cervical spine: Heterogeneous marrow signal without focal marrow replacing lesion. Sinuses/Orbits: Clear sinuses.  Unremarkable orbits. Other: No mastoid effusions. IMPRESSION: 1. No acute abnormality. 2.  Mild chronic microvascular ischemic disease and generalized cerebral volume loss. Electronically Signed   By: Margaretha Sheffield MD   On: 06/22/2020 09:24      HISTORY:   Past Medical History:  Diagnosis Date  . Atrial fibrillation with rapid ventricular response ([place]) 04/2015  . BRONCHITIS 03/10/2009   Qualifier: Diagnosis of  By: Geoffry Paradise MD, Kristine Royal   . Chest pain with moderate risk for cardiac etiology 04/28/2015  . Dehydration 06/18/2009   Qualifier: Diagnosis of  By: Pete Pelt    . Hyperlipidemia   . Hypothyroidism (acquired) 2006   s/p thyroidectomy for nodules  . Morbid obesity ([place]) 08/10/2015  . Obesity    gastric bpg 2011, weight then 400 lbs, lost 100 lbs in 60 days.  . Obstructive sleep apnea    does not use cpap, does not have a following physician  . Paroxysmal atrial fibrillation ([place]) 08/10/2015    Past Surgical History:  Procedure Laterality Date  . APPENDECTOMY    .  CHOLECYSTECTOMY    . COLONOSCOPY    . ELECTROPHYSIOLOGIC STUDY N/A 09/08/2015   Procedure: Atrial Fibrillation Ablation;  Surgeon: [name], MD;  Location: [facility];  Service: Cardiovascular;  Laterality: N/A;  . EP IMPLANTABLE DEVICE N/A 12/17/2015   Procedure: Loop Recorder Insertion;  Surgeon: [name], MD;  Location: [facility];  Service: Cardiovascular;  Laterality: N/A;  . ESOPHAGOGASTRODUODENOSCOPY    . GASTRIC BYPASS  2011  . implantable loop recorder removal  03/25/2020  . THYROIDECTOMY  2006   for nodules  . VAGINAL HYSTERECTOMY      Family History  Problem Relation Age of Onset  . Heart failure Father   . Heart failure Maternal Grandmother   . Heart failure Maternal Grandfather     Social History:  reports that she has never smoked. She has never used smokeless tobacco. She reports that she does not drink alcohol and does not use drugs.The patient is {Blank single:19197::"alone","accompanied by"} *** today.  Allergies:  Allergies  Allergen Reactions  . Nitroglycerin     Hypotension, difficulty maintaining conciousness Hypotension, difficulty maintaining conciousness Hypotension, difficulty maintaining conciousness    Current Medications: Current Outpatient Medications  Medication Sig Dispense Refill  . atorvastatin (LIPITOR) 20 MG tablet Take 20 mg by mouth daily.    Marland Kitchen buPROPion (WELLBUTRIN XL) 150 MG 24 hr tablet Take 450 mg by mouth daily.    . calcium carbonate (OS-CAL - DOSED IN MG OF ELEMENTAL CALCIUM) 1250 (500 Ca) MG tablet Take 1 tablet by mouth daily with breakfast.    . ergocalciferol (VITAMIN D2) 1.25 MG (50000 UT) capsule Take by mouth.    . gabapentin (NEURONTIN) 100 MG capsule Take 300 mg by mouth 3 (three) times daily.    Marland Kitchen ibuprofen (ADVIL) 600 MG tablet Take 600 mg by mouth every 6 (six) hours as needed.    Marland Kitchen letrozole (FEMARA) 2.5 MG tablet TAKE ONE TABLET BY MOUTH EVERY DAY 30 tablet 4  . levalbuterol (XOPENEX HFA) 45  MCG/ACT inhaler Inhale into the lungs.    Marland Kitchen levothyroxine (SYNTHROID) 75 MCG tablet Take 75 mcg by mouth daily.     Marland Kitchen omeprazole (PRILOSEC) 40 MG capsule Take 40 mg by mouth every morning.    . vitamin B-12 (CYANOCOBALAMIN) 1000 MCG tablet Take 1,000 mcg by mouth daily.     No current facility-administered medications for this visit.     ASSESSMENT & PLAN:   Assessment:  Grazia Taffe is  a 65 y.o. female ***  1. 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  Due to a high risk EndoPredict score, she received adjuvant chemotherapy with TC for 4 cycles.  She then received adjuvant radiation to the right breast.  She remains without evidence of recurrence.  She was placed on letrozole 2.5 mg daily in August 2019 and we will plan for at least a total of 5 years. 2. Radiculopathy at L4-5 with foraminal herniation impinging the left L4 nerve root.  She has spinal stenosis and severe degenerative disc disease. 3. Neuropathy of the lower extremities, which is stable.  The original presentation was unusual with intermittent symptoms. 4. Osteopenia, for which she continues calcium and vitamin-D.  She is due for bone density scan, which I will schedule. 5. Chronic anxiety and depression. 6.  Bone pain.  This may be associated with her cholesterol medication, but more likely is related to her letrozole.  However, I am not sure why it would increase at this time.  I will order a nuclear bone scan for further evaluation.  We flushed her port today, and she may have her port removed at any time if she wishes.  She knows to continue letrozole daily.  I will order a nuclear bone scan for further evaluation of her bone pain, and also schedule her for repeat bone density.  Her pain may be associated with her statin medication or letrozole.  I will see her back in 6 months with bilateral mammogram for examination and we will flush her port then if it is still in place.  She understands and agrees  with this plan of care.   Plan: ***  The patient understands the plans discussed today and is in agreement with them.  She knows to contact our office if she develops concerns prior to her next appointment.   I provided *** minutes (2:37 PM - 2:37 PM) of face-to-face time during this this encounter and > 50% was spent counseling as documented under my assessment and plan.    Marvia Pickles, PA-C

## 2020-06-29 NOTE — Telephone Encounter (Signed)
Per 2/7 LOS, patient scheduled for August Appts. Reclast to be ordered after receiving Authorization  Gave patient Appt Summary

## 2020-06-30 NOTE — Progress Notes (Signed)
[facility]  [address] [place],  [place]  [address] [phone]  Clinic Day:  06/30/2020  Referring physician: Martinique, Sarah T, MD   CHIEF COMPLAINT:  CC:  History of stage II A hormone receptor positive breast cancer.  Current Treatment:    Anastrozole 1 mg daily.   HISTORY OF PRESENT ILLNESS:  Monique Kelly is a 65 y.o. 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  She was treated with lumpectomy.  Pathology revealed a 2.1 cm, grade 3, invasive ductal carcinoma.  Two sentinel nodes were negative for metastasis.  Estrogen and progesterone receptors were highly positive and her 2 Neu negative.  Ki 67 was 20%.  Endopredict revealed an EP clin score of 4.0, which is considered high risk and correlates with an 18% risk of distant recurrence in the next 10 years without chemotherapy.  She would get an absolute 7 % benefit with chemotherapy at 10 years.  She has a moderate risk of late recurrence of 14% in years 5-15.  She received adjuvant chemotherapy with docetaxel/cyclophosphamide (TC) for 4 cycles completed in May.  She tolerated this fairly well, except for pain for neuropathy.  The presentation of her neuropathy was unusual, with intermittent symptoms.  Bone density scan in May 2019 revealed osteopenia with a T-score of -1.9 in the spine, for which she is taking calcium and vitamin-D.   She completed adjuvant radiation in mid July.  She was started on letrozole 2.5 mg daily in mid August 2019. After completion of chemotherapy, she developed lower extremity edema, right greater than left.  Peripheral edema is a fairly common side effect of docetaxel.  Right lower extremity ultrasound did not reveal any evidence of deep venous thrombosis.  She was placed on furosemide 20 mg daily as needed. Furosemide was subsequently discontinued.  She also has a history of fibromyalgia with associated  generalized pain, for which she is on gabapentin 300 mg 3 times daily, as well as bupropion 450 mg daily.  Tramadol was added in May, but later discontinued when she was found to have sleep apnea.    As she described numbness and electrical shocks sensation of the left inner calf, an MRI lumbosacral spine was done.  This revealed revealed multilevel degenerative disc disease with L4-5 left foraminal herniation impinging on the left L4 nerve root with moderate posterior element hypertrophy at that level with mild spinal stenosis, as well as well as spurring at L3-4.  She was referred to Neurosurgery and saw Dr. Saintclair Halsted.  She had abdominal pain and bloating, so saw Dr. Lyda Jester in October 2019.  She has a history of Roux-en-Y bariatric surgery in 2008.  Endoscopy in November of 2019 revealed a small bowel ulcer at the anastomosis.  Dr. Lyda Jester had her stop nonsteroidal anti-inflammatory medications and placed her on omeprazole 40 mg daily.  Her hemoglobin remained low at 10.6 in March 2020, repeat iron studies revealed persistent iron deficiency.  She was treated with hemocyte from March to September 2020.     Bilateral diagnostic mammogram in January 2021 did not reveal any evidence of malignancy.  She had COVID in February 2021.  Bone density scan in July 2021 revealed worsening osteopenia with a T-score of -2.3 in the right femur neck a T-score of -2 in the total hip and a T-score of -1.1 in the spine.  Due to worsening bone density, she was started on Reclast. At  her visit in July 2021, she reported neck pain and has been following with a neurologist.  She reported increased bone pain of her upper and lower extremities.  She was on a statin medication, but was unsure of the name or the dose.  Arthralgias and myalgias can be associated with the statin or letrozole. A nuclear bone scan in July 2021 did not reveal any evidence of malignancy.  She has reported worsening cognitive impairment, and even  discussed a possible clinical trial with our research nurse, but decided not to pursue it.   INTERVAL HISTORY:  [name] is here today for clinical reassessment and states she continues letrozole daily. She states she tolerated her 1st dose of Reclast in July without difficulty. She denies any changes in her breasts.  Prior to her visit today, she underwent repeat mammogram. She states she continues to follow with Dr. Tomi Likens in neurology and has undergone further cognitive testing. She had an MRI brain in January which did not reveal any acute abnormality. Mild chronic microvascular ischemic disease and generalized cerebral volume loss was seen. 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  She has had an epidural on 2 occasions, that did not effectively control her pain for any length of time. She continues gabapentin. She is using 600 mg of ibuprofen as well as topical patches.  Dr. Lyda Jester previously recommended she discontinue NSAIDs.  She states she had a fall when walking her dogs about 3 weeks ago, but denies injury.  She denies fevers or chills.Her appetite is good. Her weight has increased 8 pounds over last 6 months. She continues calcium/vitamin-D supplement with 1200 mg of calcium daily.  REVIEW OF SYSTEMS:  Review of Systems  Constitutional: Negative for appetite change, chills, fatigue, fever and unexpected weight change.  HENT:   Negative for lump/mass, mouth sores and sore throat.   Respiratory: Negative for cough and shortness of breath.   Cardiovascular: Negative for chest pain and leg swelling.  Gastrointestinal: Negative for abdominal pain, constipation, diarrhea, nausea and vomiting.  Genitourinary: Negative for difficulty urinating, dysuria, frequency and hematuria.   Musculoskeletal: Positive for arthralgias (Chronic, stable), back pain (chronic, stable), myalgias (chronic, stable) and neck pain (chronic, stable).   Skin: Negative for rash.  Neurological: Positive for numbness (left hand and foot). Negative for dizziness and headaches.  Psychiatric/Behavioral: Negative for depression and sleep disturbance. The patient is not nervous/anxious.      VITALS:  Blood pressure 129/73, pulse 65, temperature 98 F (36.7 C), temperature source Oral, resp. rate 18, height 5\' 3"  (1.6 m), weight 239 lb 1.6 oz (108.5 kg), SpO2 92 %.  Wt Readings from Last 3 Encounters:  06/29/20 239 lb 1.6 oz (108.5 kg)  06/02/20 231 lb (104.8 kg)  03/25/20 216 lb 3.2 oz (98.1 kg)    Body mass index is 42.35 kg/m.  Performance status (ECOG): 1 - Symptomatic but completely ambulatory  PHYSICAL EXAM:  Physical Exam Vitals and nursing note reviewed.  Constitutional:      General: She is not in acute distress.    Appearance: Normal appearance.  HENT:     Mouth/Throat:     Mouth: Mucous membranes are moist.     Pharynx: Oropharynx is clear. No oropharyngeal exudate or posterior oropharyngeal erythema.  Eyes:     General: No scleral icterus.    Extraocular Movements: Extraocular movements intact.     Conjunctiva/sclera: Conjunctivae normal.  Pupils: Pupils are equal, round, and reactive to light.  Cardiovascular:     Rate and Rhythm: Normal rate and regular rhythm.     Heart sounds: Normal heart sounds. No murmur heard. No friction rub. No gallop.   Pulmonary:     Effort: Pulmonary effort is normal. No respiratory distress.     Breath sounds: Normal breath sounds. No stridor. No wheezing, rhonchi or rales.  Chest:  Breasts:     Right: Normal. No swelling, bleeding, inverted nipple, mass, nipple discharge, skin change, tenderness, axillary adenopathy or supraclavicular adenopathy.     Left: Normal. No swelling, bleeding, inverted nipple, mass, nipple discharge, skin change, tenderness, axillary adenopathy or supraclavicular adenopathy.    Abdominal:     General: There is no distension.     Palpations: Abdomen is  soft. There is no hepatomegaly, splenomegaly or mass.     Tenderness: There is no abdominal tenderness. There is no guarding.     Hernia: No hernia is present.  Musculoskeletal:        General: Normal range of motion.     Cervical back: Normal range of motion and neck supple. No tenderness.     Right lower leg: No edema.     Left lower leg: No edema.  Lymphadenopathy:     Cervical: No cervical adenopathy.     Upper Body:     Right upper body: No supraclavicular or axillary adenopathy.     Left upper body: No supraclavicular or axillary adenopathy.     Lower Body: No right inguinal adenopathy. No left inguinal adenopathy.  Skin:    General: Skin is warm and dry.     Coloration: Skin is not jaundiced.     Findings: No rash.  Neurological:     Mental Status: She is alert and oriented to person, place, and time.     Cranial Nerves: No cranial nerve deficit.  Psychiatric:        Mood and Affect: Mood normal.        Behavior: Behavior normal.        Thought Content: Thought content normal.    LABS:   CBC Latest Ref Rng & Units 07/25/2019 09/08/2015 08/24/2015  WBC 4.0 - 10.5 K/uL 5.0 5.0 13.5(H)  Hemoglobin 12.0 - 15.0 g/dL 12.7 12.5 12.9  Hematocrit 36.0 - 46.0 % 39.7 39.1 39.4  Platelets 150 - 400 K/uL 228 197 278   CMP Latest Ref Rng & Units 07/25/2019 08/24/2015 04/28/2015  Glucose 70 - 99 mg/dL 100(H) 51(L) 79  BUN 8 - 23 mg/dL 11 13 14   Creatinine 0.44 - 1.00 mg/dL 0.87 0.86 0.91  Sodium 135 - 145 mmol/L 142 142 142  Potassium 3.5 - 5.1 mmol/L 3.9 4.2 3.8  Chloride 98 - 111 mmol/L 108 105 107  CO2 22 - 32 mmol/L 23 29 25   Calcium 8.9 - 10.3 mg/dL 8.9 8.9 9.0  Total Protein 6.5 - 8.1 g/dL - - 6.2(L)  Total Bilirubin 0.3 - 1.2 mg/dL - - 0.5  Alkaline Phos 38 - 126 U/L - - 88  AST 15 - 41 U/L - - 24  ALT 14 - 54 U/L - - 18     No results found for: CEA1 / No results found for: CEA1 No results found for: PSA1 No results found for: EYC144 No results found for: CAN125  No  results found for: TOTALPROTELP, ALBUMINELP, A1GS, A2GS, BETS, BETA2SER, GAMS, MSPIKE, SPEI No results found for: TIBC, FERRITIN, IRONPCTSAT No results found  for: LDH  STUDIES:  MR BRAIN WO CONTRAST  Result Date: 06/22/2020 CLINICAL DATA:  Memory loss, ataxia. EXAM: MRI HEAD WITHOUT CONTRAST TECHNIQUE: Multiplanar, multiecho pulse sequences of the brain and surrounding structures were obtained without intravenous contrast. COMPARISON:  CT head 01/25/2019. FINDINGS: Brain: No acute infarction, hemorrhage, hydrocephalus, or extra-axial collection. No specific evidence of mass lesion, although evaluation is limited without IV contrast. Mild scattered T2/FLAIR hyperintensities within the periventricular white matter, most likely related to age-appropriate chronic microvascular ischemic change. Mild generalized volume loss. Vascular: Major arterial flow voids are maintained at the skull base. Diminutive, nondominant right vertebral artery. Skull and upper cervical spine: Heterogeneous marrow signal without focal marrow replacing lesion. Sinuses/Orbits: Clear sinuses.  Unremarkable orbits. Other: No mastoid effusions. IMPRESSION: 1. No acute abnormality. 2. Mild chronic microvascular ischemic disease and generalized cerebral volume loss. Electronically Signed   By: Margaretha Sheffield MD   On: 06/22/2020 09:24     Exam(s): [id number] MAM/MAM DIGITAL W/TOMO DIAG B CLINICAL DATA:  RIGHT lumpectomy with radiation treatment 2019.  EXAM: DIGITAL DIAGNOSTIC BILATERAL MAMMOGRAM WITH TOMOSYNTHESIS AND CAD  TECHNIQUE: Bilateral Digital diagnostic mammography and breast tomosynthesis was performed. Digital images of the breasts were evaluated with computer-aided detection.  COMPARISON:  Previous exam(s).  ACR Breast Density Category b: There are scattered areas of fibroglandular density.  FINDINGS: Postoperative changes are identified in the anterior central portion of the RIGHT breast. A Port-A-Cath  remains on the LEFT side. A loop recorder has been removed. No suspicious mass, distortion, or microcalcifications are identified to suggest presence of malignancy.  IMPRESSION: No mammographic evidence for malignancy.  RECOMMENDATION: Screening mammogram in one year.(Code:[id number])   HISTORY:   Past Medical History:  Diagnosis Date  . Atrial fibrillation with rapid ventricular response ([facility]) 04/2015  . BRONCHITIS 03/10/2009   Qualifier: Diagnosis of  By: Geoffry Paradise MD, Kristine Royal   . Chest pain with moderate risk for cardiac etiology 04/28/2015  . Dehydration 06/18/2009   Qualifier: Diagnosis of  By: Pete Pelt    . Hyperlipidemia   . Hypothyroidism (acquired) 2006   s/p thyroidectomy for nodules  . Morbid obesity ([facility]) 08/10/2015  . Obesity    gastric bpg 2011, weight then 400 lbs, lost 100 lbs in 60 days.  . Obstructive sleep apnea    does not use cpap, does not have a following physician  . Paroxysmal atrial fibrillation ([facility]) 08/10/2015    Past Surgical History:  Procedure Laterality Date  . APPENDECTOMY    . CHOLECYSTECTOMY    . COLONOSCOPY    . ELECTROPHYSIOLOGIC STUDY N/A 09/08/2015   Procedure: Atrial Fibrillation Ablation;  Surgeon: [name], MD;  Location: [facility];  Service: Cardiovascular;  Laterality: N/A;  . EP IMPLANTABLE DEVICE N/A 12/17/2015   Procedure: Loop Recorder Insertion;  Surgeon: [name], MD;  Location: [facility];  Service: Cardiovascular;  Laterality: N/A;  . ESOPHAGOGASTRODUODENOSCOPY    . GASTRIC BYPASS  2011  . implantable loop recorder removal  03/25/2020  . THYROIDECTOMY  2006   for nodules  . VAGINAL HYSTERECTOMY      Family History  Problem Relation Age of Onset  . Heart failure Father   . Heart failure Maternal Grandmother   . Heart failure Maternal Grandfather     Social History:  reports that she has never smoked. She has never used smokeless tobacco. She reports that she does not drink alcohol and  does not use drugs.The patient is alone today.  Allergies:  Allergies  Allergen Reactions  . Nitroglycerin     Hypotension, difficulty maintaining conciousness Hypotension, difficulty maintaining conciousness Hypotension, difficulty maintaining conciousness    Current Medications: Current Outpatient Medications  Medication Sig Dispense Refill  . atorvastatin (LIPITOR) 20 MG tablet Take 20 mg by mouth daily.    Marland Kitchen buPROPion (WELLBUTRIN XL) 150 MG 24 hr tablet Take 450 mg by mouth daily.    . calcium carbonate (OS-CAL - DOSED IN MG OF ELEMENTAL CALCIUM) 1250 (500 Ca) MG tablet Take 1 tablet by mouth daily with breakfast.    . ergocalciferol (VITAMIN D2) 1.25 MG (50000 UT) capsule Take by mouth.    . gabapentin (NEURONTIN) 100 MG capsule Take 300 mg by mouth 3 (three) times daily.    Marland Kitchen ibuprofen (ADVIL) 600 MG tablet Take 600 mg by mouth every 6 (six) hours as needed.    Marland Kitchen letrozole (FEMARA) 2.5 MG tablet TAKE ONE TABLET BY MOUTH EVERY DAY 30 tablet 4  . levalbuterol (XOPENEX HFA) 45 MCG/ACT inhaler Inhale into the lungs.    Marland Kitchen levothyroxine (SYNTHROID) 75 MCG tablet Take 75 mcg by mouth daily.     Marland Kitchen omeprazole (PRILOSEC) 40 MG capsule Take 40 mg by mouth every morning.    . vitamin B-12 (CYANOCOBALAMIN) 1000 MCG tablet Take 1,000 mcg by mouth daily.     No current facility-administered medications for this visit.     ASSESSMENT & PLAN:   Assessment:   1. 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 She remains without evidence of recurrence. She will continue letrozole 2.5 mg daily for at least 5 years. 2. Radiculopathy at L4-5 with foraminal herniation impinging the left L4 nerve root.  She has spinal stenosis and severe degenerative disc disease. 3. Neuropathy of the lower extremities, which is stable.  The original presentation was unusual with intermittent symptoms. 4. Osteopenia, for which she was started on yearly Reclast in July 2021. She continues calcium and  vitamin-D. 5. Chronic anxiety and depression, controlled with medication. 6. Bone pain, which is stable. This is felt to be more likely is related to her letrozole.  Nuclear bone scan did not reveal any evidence of bone metastasis.  Plan:   She knows to continue letrozole daily. We flushed her port today.  She knows she may have her port removed at any time if she wishes.  We will see her back in 6 months with a CBC and comprehensive metabolic panel prior to Reclast.  The patient understands the plans discussed today and is in agreement with them.  She knows to contact our office if she develops concerns prior to her next appointment.     Marvia Pickles, PA-C

## 2020-07-14 ENCOUNTER — Encounter: Payer: 59 | Admitting: Psychology

## 2020-07-21 ENCOUNTER — Encounter: Payer: 59 | Admitting: Psychology

## 2020-07-22 NOTE — Progress Notes (Signed)
Cancelled.  

## 2020-07-23 ENCOUNTER — Ambulatory Visit (INDEPENDENT_AMBULATORY_CARE_PROVIDER_SITE_OTHER): Payer: 59 | Admitting: Family Medicine

## 2020-07-23 DIAGNOSIS — E039 Hypothyroidism, unspecified: Secondary | ICD-10-CM

## 2020-07-23 DIAGNOSIS — I4891 Unspecified atrial fibrillation: Secondary | ICD-10-CM

## 2020-10-12 ENCOUNTER — Other Ambulatory Visit: Payer: Self-pay

## 2020-10-12 ENCOUNTER — Encounter: Payer: Self-pay | Admitting: Family Medicine

## 2020-10-12 ENCOUNTER — Ambulatory Visit (INDEPENDENT_AMBULATORY_CARE_PROVIDER_SITE_OTHER): Payer: 59 | Admitting: Family Medicine

## 2020-10-12 VITALS — BP 114/72 | HR 84 | Temp 97.2°F | Resp 16 | Ht 66.0 in | Wt 253.0 lb

## 2020-10-12 DIAGNOSIS — R635 Abnormal weight gain: Secondary | ICD-10-CM

## 2020-10-12 DIAGNOSIS — R140 Abdominal distension (gaseous): Secondary | ICD-10-CM | POA: Diagnosis not present

## 2020-10-12 DIAGNOSIS — E782 Mixed hyperlipidemia: Secondary | ICD-10-CM

## 2020-10-12 DIAGNOSIS — M797 Fibromyalgia: Secondary | ICD-10-CM

## 2020-10-12 DIAGNOSIS — G5793 Unspecified mononeuropathy of bilateral lower limbs: Secondary | ICD-10-CM

## 2020-10-12 DIAGNOSIS — E538 Deficiency of other specified B group vitamins: Secondary | ICD-10-CM

## 2020-10-12 DIAGNOSIS — E039 Hypothyroidism, unspecified: Secondary | ICD-10-CM

## 2020-10-12 DIAGNOSIS — I4891 Unspecified atrial fibrillation: Secondary | ICD-10-CM

## 2020-10-12 DIAGNOSIS — K9089 Other intestinal malabsorption: Secondary | ICD-10-CM

## 2020-10-12 DIAGNOSIS — I482 Chronic atrial fibrillation, unspecified: Secondary | ICD-10-CM

## 2020-10-12 DIAGNOSIS — Z171 Estrogen receptor negative status [ER-]: Secondary | ICD-10-CM

## 2020-10-12 DIAGNOSIS — G4733 Obstructive sleep apnea (adult) (pediatric): Secondary | ICD-10-CM

## 2020-10-12 DIAGNOSIS — C50411 Malignant neoplasm of upper-outer quadrant of right female breast: Secondary | ICD-10-CM

## 2020-10-12 DIAGNOSIS — E890 Postprocedural hypothyroidism: Secondary | ICD-10-CM

## 2020-10-12 MED ORDER — GABAPENTIN 300 MG PO CAPS: ORAL_CAPSULE | ORAL | 0 refills | Status: DC

## 2020-10-12 NOTE — Patient Instructions (Signed)
Gabapentin 300 mg 2 twice a day.  Ordering ct scan of abdomen/pelvis.  Start savella (likely will need prior authorization)

## 2020-10-12 NOTE — Progress Notes (Signed)
Established Patient Office Visit  Subjective:  Patient ID: Monique Kelly, female    DOB: [birth year]  Age: 65 y.o. MRN: [id number]  CC:  Chief Complaint  Patient presents with  . New Patient (Initial Visit)    HPI Monique Kelly presents to establish care  OSA: has tried cpap numerous times and has not tolerated. Is currently checking into dental procedure.   Chronic pain in neck. Neck surgery 2005 replaced a disc. Arthritis below and above replacement.  Fibromyalgia: savella worked great, but was unable to get paid for by insurance.   Peripheral neuropathy:  Falls frequently. Fallen twice in the last 6 months. Had a black eye. Bruised knees. Last fall was 2 months ago. Left foot always numb and gives out on her. Had [place] [facility] and their pain clinic. Had injections in upper back x 2 which did not help. NCV of legs and was told it was secondary to mild spinal stenosis and pinched nerve. Gabapentin helps  Increased weight of 40 lbs in 6 weeks. Poor diet. No exercise. Abdomen swelling.  History of retroperitoneal mass which was excised.  Had breast cancer (2019) - rt lumpectomy and chemo and radiation. Currently on femara, reclast IV annually.  Surgeon: Dr. Noberto Retort. Had postoperatively that required packing and healing by secondary intention. Caused postponing chemo. Saw Dr. Hinton Rao. 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 He did resection and it ended up not being cancer. Was benign.   Past Medical History:  Diagnosis Date  . Atrial fibrillation [facility]) 2016   Sees Dr. [name]. Cardiac ablation, loop recorder (removed in 2022)  . Atrial fibrillation with rapid ventricular response ([place]) 04/2015  . Breast cancer ([place])    right lumpectomy  . BRONCHITIS 03/10/2009   Qualifier: Diagnosis of  By: Geoffry Paradise MD, Kristine Royal   . Chest pain with moderate risk for cardiac etiology 04/28/2015  . Dehydration 06/18/2009   Qualifier: Diagnosis of  By: Pete Pelt     . Depression   . Family history of breast cancer   . Family history of colon cancer   . Fibromyalgia 2010  . Hyperlipidemia   . Hypothyroidism (acquired) 2006   s/p thyroidectomy for nodules  . Morbid obesity ([place]) 08/10/2015  . Obesity    gastric bpg 2011, weight then 400 lbs, lost 100 lbs in 60 days.  . Obstructive sleep apnea    does not use cpap, does not have a following physician  . Paroxysmal atrial fibrillation ([place]) 08/10/2015    Past Surgical History:  Procedure Laterality Date  . ABDOMINAL HYSTERECTOMY  1992   total.   . APPENDECTOMY    . BREAST SURGERY  2019   right lumpectomy   . CHOLECYSTECTOMY    . COLONOSCOPY    . ELECTROPHYSIOLOGIC STUDY N/A 09/08/2015   Procedure: Atrial Fibrillation Ablation;  Surgeon: [name], MD;  Location: [place] [facility];  Service: Cardiovascular;  Laterality: N/A;  . EP IMPLANTABLE DEVICE N/A 12/17/2015   Procedure: Loop Recorder Insertion;  Surgeon: [name], MD;  Location: [place] [facility];  Service: Cardiovascular;  Laterality: N/A;  . ESOPHAGOGASTRODUODENOSCOPY    . EXPLORATORY LAPAROTOMY WITH ABDOMINAL MASS EXCISION  2018  . GASTRIC BYPASS  2011   lost 100 lbs, but gained some back.  . implantable loop recorder removal  03/25/2020   removed 2022  . THYROIDECTOMY  2006   for nodules ( hot and cold nodules) Goiter compressing trachea.    Family History  Problem  Relation Age of Onset  . Heart failure Father   . Cancer Father        colon  . Heart failure Maternal Grandmother   . Heart failure Maternal Grandfather   . Cancer Mother        breast    Social History   Socioeconomic History  . Marital status: Single    Spouse name: Not on file  . Number of children: Not on file  . Years of education: Not on file  . Highest education level: Not on file  Occupational History  . Occupation: Armed forces training and education officer  Tobacco Use  . Smoking status: Never Smoker  . Smokeless tobacco: Never Used  Vaping Use  .  Vaping Use: Never used  Substance and Sexual Activity  . Alcohol use: No    Alcohol/week: 0.0 standard drinks    Comment: rare glass of wine  . Drug use: No  . Sexual activity: Not on file  Other Topics Concern  . Not on file  Social History Narrative   Pt lives in [place] alone. Works as Technical brewer for [employer].   Right handed   Social Determinants of Health   Financial Resource Strain: Not on file  Food Insecurity: Not on file  Transportation Needs: Not on file  Physical Activity: Not on file  Stress: Not on file  Social Connections: Not on file  Intimate Partner Violence: Not on file    Outpatient Medications Prior to Visit  Medication Sig Dispense Refill  . atorvastatin (LIPITOR) 20 MG tablet Take 20 mg by mouth daily.    Marland Kitchen buPROPion (WELLBUTRIN XL) 150 MG 24 hr tablet Take 450 mg by mouth daily.    . calcium carbonate (OS-CAL - DOSED IN MG OF ELEMENTAL CALCIUM) 1250 (500 Ca) MG tablet Take 1 tablet by mouth daily with breakfast.    . ergocalciferol (VITAMIN D2) 1.25 MG (50000 UT) capsule Take by mouth.    Marland Kitchen ibuprofen (ADVIL) 600 MG tablet Take 600 mg by mouth every 6 (six) hours as needed.    Marland Kitchen letrozole (FEMARA) 2.5 MG tablet TAKE ONE TABLET BY MOUTH EVERY DAY 30 tablet 4  . levalbuterol (XOPENEX HFA) 45 MCG/ACT inhaler Inhale 2 puffs into the lungs every 8 (eight) hours as needed.    Marland Kitchen levothyroxine (SYNTHROID) 75 MCG tablet Take 75 mcg by mouth daily.     Marland Kitchen omeprazole (PRILOSEC) 40 MG capsule Take 40 mg by mouth every morning.    . vitamin B-12 (CYANOCOBALAMIN) 1000 MCG tablet Take 1,000 mcg by mouth daily.    . Zoledronic Acid (RECLAST IV) Inject into the vein.    Marland Kitchen gabapentin (NEURONTIN) 100 MG capsule Take 300 mg by mouth 2 (two) times daily.    Marland Kitchen gabapentin (NEURONTIN) 300 MG capsule Take 1 capsule by mouth 2 (two) times daily.     No facility-administered medications prior to visit.    Allergies  Allergen Reactions  . Nitroglycerin      Hypotension, difficulty maintaining conciousness Hypotension, difficulty maintaining conciousness Hypotension, difficulty maintaining conciousness    ROS Review of Systems  Constitutional: Positive for fatigue and unexpected weight change. Negative for chills and fever.  HENT: Negative for congestion, ear pain, rhinorrhea and sore throat.   Respiratory: Positive for shortness of breath. Negative for cough.   Cardiovascular: Positive for palpitations and leg swelling. Negative for chest pain.  Gastrointestinal: Negative for abdominal pain, constipation, diarrhea, nausea and vomiting.  Genitourinary: Negative for dysuria and urgency.  Musculoskeletal:  Negative for back pain and myalgias.  Neurological: Positive for weakness and numbness. Negative for dizziness, light-headedness and headaches.  Psychiatric/Behavioral: Positive for dysphoric mood. The patient is not nervous/anxious.       Objective:    Physical Exam Constitutional:      Appearance: Normal appearance. She is well-developed. She is obese.  HENT:     Right Ear: External ear normal.     Left Ear: External ear normal.     Nose: Nose normal.     Mouth/Throat:     Pharynx: No oropharyngeal exudate or posterior oropharyngeal erythema.  Cardiovascular:     Rate and Rhythm: Normal rate and regular rhythm.     Heart sounds: Normal heart sounds.  Pulmonary:     Effort: Pulmonary effort is normal.     Breath sounds: Normal breath sounds.  Abdominal:     General: Bowel sounds are normal. There is distension.     Tenderness: There is no abdominal tenderness.     Hernia: A hernia is present.  Musculoskeletal:        General: Tenderness (FM trigger points of lower extremities. ) present.  Neurological:     Mental Status: She is alert and oriented to person, place, and time.  Psychiatric:        Mood and Affect: Mood normal.        Behavior: Behavior normal.     BP 114/72   Pulse 84   Temp (!) 97.2 F (36.2 C)   Resp  16   Ht 5\' 6"  (1.676 m)   Wt 253 lb (114.8 kg)   BMI 40.84 kg/m  Wt Readings from Last 3 Encounters:  10/12/20 253 lb (114.8 kg)  06/29/20 239 lb 1.6 oz (108.5 kg)  06/02/20 231 lb (104.8 kg)     Health Maintenance Due  Topic Date Due  . HIV Screening  Never done  . Hepatitis C Screening  Never done  . TETANUS/TDAP  Never done  . PAP SMEAR-Modifier  Never done  . COLONOSCOPY (Pts 45-47yrs Insurance coverage will need to be confirmed)  Never done    There are no preventive care reminders to display for this patient.  Lab Results  Component Value Date   TSH 2.192 04/28/2015   Lab Results  Component Value Date   WBC 5.0 07/25/2019   HGB 12.7 07/25/2019   HCT 39.7 07/25/2019   MCV 101.8 (H) 07/25/2019   PLT 228 07/25/2019   Lab Results  Component Value Date   NA 142 07/25/2019   K 3.9 07/25/2019   CO2 23 07/25/2019   GLUCOSE 100 (H) 07/25/2019   BUN 11 07/25/2019   CREATININE 0.87 07/25/2019   BILITOT 0.5 04/28/2015   ALKPHOS 88 04/28/2015   AST 24 04/28/2015   ALT 18 04/28/2015   PROT 6.2 (L) 04/28/2015   ALBUMIN 3.5 04/28/2015   CALCIUM 8.9 07/25/2019   ANIONGAP 11 07/25/2019   No results found for: CHOL No results found for: HDL No results found for: LDLCALC No results found for: TRIG No results found for: CHOLHDL No results found for: HGBA1C    Assessment & Plan:   1. Abdominal distension (gaseous) History of abdominal mass. Concerning for ascites.  - CT Abdomen Pelvis Wo Contrast  2. Abnormal weight gain Concerning for ascites.   3. Chronic atrial fibrillation (HCC) In NSR on exam  Continue to work on eating a healthy diet and exercise.  Labs drawn today.  - CBC with Differential/Platelet - Comprehensive  metabolic panel  4. Hypothyroidism (acquired) The current medical regimen is effective;  continue present plan and medications.  5. Fibromyalgia Start on savella starter pack.   6. Neuropathy of both feet Change gabapentin 300 mg 2  twice a day.  - B12 and Folate Panel - TSH - Methylmalonic acid, serum  7. Mixed hyperlipidemia The current medical regimen is effective;  continue present plan and medications. Recommend continue to work on eating healthy diet and exercise.  8. Obstructive sleep apnea syndrome Recommend weight loss. Intolerant to CPAP.  9. Morbid obesity ([place]) Recommend continue to work on eating healthy diet and exercise.  10. Malignant neoplasm of upper-outer quadrant of right breast in female, estrogen receptor negative ([place]) Mgmt with Oncology. Continue femara.   11. B12 deficiency Check b12, folate, mma  12. 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  Check b12, folate, mma.   Meds ordered this encounter  Medications  . gabapentin (NEURONTIN) 300 MG capsule    Sig: 3 in am and 1 at night.    Dispense:  1 capsule    Refill:  0  . Milnacipran HCl (SAVELLA TITRATION PACK) 12.5 & 25 & 50 MG MISC    Sig: 12.5 mg on Day #1, 25 mg Daily x 1 week, then 50 mg once daily x 3 weeks (Starter pack)    Dispense:  1 each    Refill:  0    One Pack.    Follow-up: Return in about 2 weeks (around 10/26/2020).    Rochel Brome, MD

## 2020-10-13 ENCOUNTER — Encounter: Payer: Self-pay | Admitting: Family Medicine

## 2020-10-13 MED ORDER — SAVELLA TITRATION PACK 12.5 & 25 & 50 MG PO MISC: ORAL | 0 refills | Status: DC

## 2020-10-17 LAB — COMPREHENSIVE METABOLIC PANEL
ALT: 19 IU/L (ref 0–32)
AST: 24 IU/L (ref 0–40)
Albumin/Globulin Ratio: 1.7 (ref 1.2–2.2)
Albumin: 4 g/dL (ref 3.8–4.8)
Alkaline Phosphatase: 94 IU/L (ref 44–121)
BUN/Creatinine Ratio: 15 (ref 12–28)
BUN: 13 mg/dL (ref 8–27)
Bilirubin Total: 0.5 mg/dL (ref 0.0–1.2)
CO2: 24 mmol/L (ref 20–29)
Calcium: 9.2 mg/dL (ref 8.7–10.3)
Chloride: 104 mmol/L (ref 96–106)
Creatinine, Ser: 0.84 mg/dL (ref 0.57–1.00)
Globulin, Total: 2.4 g/dL (ref 1.5–4.5)
Glucose: 92 mg/dL (ref 65–99)
Potassium: 4.6 mmol/L (ref 3.5–5.2)
Sodium: 142 mmol/L (ref 134–144)
Total Protein: 6.4 g/dL (ref 6.0–8.5)
eGFR: 78 mL/min/{1.73_m2} (ref >59)

## 2020-10-17 LAB — CBC WITH DIFFERENTIAL/PLATELET
Basophils Absolute: 0.1 10*3/uL (ref 0.0–0.2)
Basos: 1 %
EOS (ABSOLUTE): 0.2 10*3/uL (ref 0.0–0.4)
Eos: 3 %
Hematocrit: 37.8 % (ref 34.0–46.6)
Hemoglobin: 12.1 g/dL (ref 11.1–15.9)
Immature Grans (Abs): 0 10*3/uL (ref 0.0–0.1)
Immature Granulocytes: 0 %
Lymphocytes Absolute: 1.5 10*3/uL (ref 0.7–3.1)
Lymphs: 27 %
MCH: 30.4 pg (ref 26.6–33.0)
MCHC: 32 g/dL (ref 31.5–35.7)
MCV: 95 fL (ref 79–97)
Monocytes Absolute: 0.4 10*3/uL (ref 0.1–0.9)
Monocytes: 7 %
Neutrophils Absolute: 3.4 10*3/uL (ref 1.4–7.0)
Neutrophils: 62 %
Platelets: 251 10*3/uL (ref 150–450)
RBC: 3.98 x10E6/uL (ref 3.77–5.28)
RDW: 13.2 % (ref 11.7–15.4)
WBC: 5.6 10*3/uL (ref 3.4–10.8)

## 2020-10-17 LAB — B12 AND FOLATE PANEL
Folate: >20 ng/mL (ref >3.0)
Vitamin B-12: 1129 pg/mL (ref 232–1245)

## 2020-10-17 LAB — TSH: TSH: 3.51 u[IU]/mL (ref 0.450–4.500)

## 2020-10-17 LAB — METHYLMALONIC ACID, SERUM: Methylmalonic Acid: 201 nmol/L (ref 0–378)

## 2020-10-20 ENCOUNTER — Other Ambulatory Visit: Payer: Self-pay | Admitting: Oncology

## 2020-10-21 ENCOUNTER — Encounter: Payer: Self-pay | Admitting: Hematology and Oncology

## 2020-10-23 ENCOUNTER — Other Ambulatory Visit: Payer: Self-pay

## 2020-10-23 ENCOUNTER — Ambulatory Visit
Admission: RE | Admit: 2020-10-23 | Discharge: 2020-10-23 | Disposition: A | Discharge status: Home / Self Care | Payer: 59 | Source: Ambulatory Visit | Attending: Family Medicine | Admitting: Family Medicine

## 2020-10-26 ENCOUNTER — Encounter: Payer: Self-pay | Admitting: Family Medicine

## 2020-10-26 ENCOUNTER — Telehealth: Payer: Self-pay

## 2020-10-26 ENCOUNTER — Ambulatory Visit (INDEPENDENT_AMBULATORY_CARE_PROVIDER_SITE_OTHER): Payer: 59 | Admitting: Family Medicine

## 2020-10-26 ENCOUNTER — Other Ambulatory Visit: Payer: Self-pay

## 2020-10-26 VITALS — BP 110/70 | HR 86 | Temp 97.3°F | Ht 66.0 in | Wt 255.0 lb

## 2020-10-26 DIAGNOSIS — K432 Incisional hernia without obstruction or gangrene: Secondary | ICD-10-CM | POA: Diagnosis not present

## 2020-10-26 DIAGNOSIS — M797 Fibromyalgia: Secondary | ICD-10-CM

## 2020-10-26 DIAGNOSIS — M545 Low back pain, unspecified: Secondary | ICD-10-CM

## 2020-10-26 DIAGNOSIS — Z6841 Body Mass Index (BMI) 40.0 and over, adult: Secondary | ICD-10-CM

## 2020-10-26 DIAGNOSIS — R1084 Generalized abdominal pain: Secondary | ICD-10-CM | POA: Diagnosis not present

## 2020-10-26 DIAGNOSIS — M542 Cervicalgia: Secondary | ICD-10-CM

## 2020-10-26 MED ORDER — SAXENDA 18 MG/3ML ~~LOC~~ SOPN: 1.8000 mg | PEN_INJECTOR | Freq: Every day | SUBCUTANEOUS | 2 refills | Status: DC

## 2020-10-26 NOTE — Progress Notes (Signed)
Subjective:  Patient ID: Monique Kelly, female    DOB: December 01, 1955  Age: 65 y.o. MRN: [id number]  Chief Complaint  Patient presents with  . Atrial Fibrillation    HPI Abdominal pain occurs 3-4 times per week. Puts her in the bed. She has to belch a lot, which seems to help. BMs every day. Unclear what triggers are. Pt saw Dr. Lyda Jester in 2018. At that time found to have a retroperitoneal mass which was surgically removed at [facility] and was not cancer.  Abdominal CT scan done last week showed 2 large hernias, aortic atherosclerosis, and diverticulosis.  Complaining of regurgitation since gastric bypass in 2011, but worsening.  OSA: unable to use cpap. Not able to use inspire device.  [facility] has a type of device to move jaw forward to help with OSA. Wakes up numerous times per night. Neck pain awakens her. Rt shoulder pain.  Fibromyalgia: Previously on duloxetine, on ibuprofen. On gabapentin 300 mg 3 in am and 1 at night. Previously on savella which worked Engineer, manufacturing. We were trying to get it approved, but have not been able to get it approved.   Current Outpatient Medications on File Prior to Visit  Medication Sig Dispense Refill  . atorvastatin (LIPITOR) 20 MG tablet Take 20 mg by mouth daily.    Marland Kitchen buPROPion (WELLBUTRIN XL) 150 MG 24 hr tablet Take 450 mg by mouth daily.    . calcium carbonate (OS-CAL - DOSED IN MG OF ELEMENTAL CALCIUM) 1250 (500 Ca) MG tablet Take 1 tablet by mouth daily with breakfast.    . ergocalciferol (VITAMIN D2) 1.25 MG (50000 UT) capsule Take by mouth.    . gabapentin (NEURONTIN) 300 MG capsule 3 in am and 1 at night. 1 capsule 0  . ibuprofen (ADVIL) 600 MG tablet Take 600 mg by mouth every 6 (six) hours as needed.    Marland Kitchen letrozole (FEMARA) 2.5 MG tablet TAKE ONE TABLET BY MOUTH EVERY DAY 90 tablet 3  . levalbuterol (XOPENEX HFA) 45 MCG/ACT inhaler Inhale 2 puffs into the lungs every 8 (eight) hours as needed.    Marland Kitchen levothyroxine (SYNTHROID) 75 MCG  tablet Take 75 mcg by mouth daily.     . Milnacipran HCl (SAVELLA TITRATION PACK) 12.5 & 25 & 50 MG MISC 12.5 mg on Day #1, 25 mg Daily x 1 week, then 50 mg once daily x 3 weeks (Starter pack) 1 each 0  . omeprazole (PRILOSEC) 40 MG capsule Take 40 mg by mouth every morning.    . vitamin B-12 (CYANOCOBALAMIN) 1000 MCG tablet Take 1,000 mcg by mouth daily.    . Zoledronic Acid (RECLAST IV) Inject into the vein.    . [DISCONTINUED] albuterol (VENTOLIN HFA) 108 (90 Base) MCG/ACT inhaler Inhale 2 puffs into the lungs every 6 (six) hours as needed for wheezing or shortness of breath.     No current facility-administered medications on file prior to visit.   Past Medical History:  Diagnosis Date  . Atrial fibrillation [facility]) 2016   Sees Dr. [name]. Cardiac ablation, loop recorder (removed in 2022)  . Atrial fibrillation with rapid ventricular response ([place]) 04/2015  . Breast cancer ([place])    right lumpectomy  . BRONCHITIS 03/10/2009   Qualifier: Diagnosis of  By: Geoffry Paradise MD, Kristine Royal   . Chest pain with moderate risk for cardiac etiology 04/28/2015  . Dehydration 06/18/2009   Qualifier: Diagnosis of  By: Pete Pelt    . Depression   . Family  history of breast cancer   . Family history of colon cancer   . Fibromyalgia 2010  . Hyperlipidemia   . Hypothyroidism (acquired) 2006   s/p thyroidectomy for nodules  . Morbid obesity ([place]) 08/10/2015  . Obesity    gastric bpg 2011, weight then 400 lbs, lost 100 lbs in 60 days.  . Obstructive sleep apnea    does not use cpap, does not have a following physician  . Paroxysmal atrial fibrillation ([place]) 08/10/2015   Past Surgical History:  Procedure Laterality Date  . ABDOMINAL HYSTERECTOMY  1992   total.   . APPENDECTOMY    . BREAST SURGERY  2019   right lumpectomy   . CHOLECYSTECTOMY    . COLONOSCOPY    . ELECTROPHYSIOLOGIC STUDY N/A 09/08/2015   Procedure: Atrial Fibrillation Ablation;  Surgeon: [name], MD;  Location: [place] [facility];  Service: Cardiovascular;  Laterality: N/A;  . EP IMPLANTABLE DEVICE N/A 12/17/2015   Procedure: Loop Recorder Insertion;  Surgeon: [name], MD;  Location: [place] [facility];  Service: Cardiovascular;  Laterality: N/A;  . ESOPHAGOGASTRODUODENOSCOPY    . EXPLORATORY LAPAROTOMY WITH ABDOMINAL MASS EXCISION  2018  . GASTRIC BYPASS  2011   lost 100 lbs, but gained some back.  . implantable loop recorder removal  03/25/2020   removed 2022  . THYROIDECTOMY  2006   for nodules ( hot and cold nodules) Goiter compressing trachea.    Family History  Problem Relation Age of Onset  . Heart failure Father   . Cancer Father        colon  . Heart failure Maternal Grandmother   . Heart failure Maternal Grandfather   . Cancer Mother        breast   Social History   Socioeconomic History  . Marital status: Single    Spouse name: Not on file  . Number of children: Not on file  . Years of education: Not on file  . Highest education level: Not on file  Occupational History  . Occupation: Armed forces training and education officer  Tobacco Use  . Smoking status: Never Smoker  . Smokeless tobacco: Never Used  Vaping Use  . Vaping Use: Never used  Substance and Sexual Activity  . Alcohol use: No    Alcohol/week: 0.0 standard drinks    Comment: rare glass of wine  . Drug use: No  . Sexual activity: Not on file  Other Topics Concern  . Not on file  Social History Narrative   Pt lives in [place] alone. 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  HENT: Negative for congestion, rhinorrhea and sore throat.   Respiratory: Positive for shortness of breath. Negative for  cough.   Cardiovascular: Negative for chest pain.  Gastrointestinal: Positive for abdominal pain. Negative for constipation, diarrhea, nausea and vomiting.  Genitourinary: Positive for urgency. Negative for dysuria.  Musculoskeletal: Positive for arthralgias, back pain and myalgias.  Neurological: Negative for dizziness, weakness, light-headedness and headaches.  Psychiatric/Behavioral: Negative for dysphoric mood. The patient is not nervous/anxious.      Objective:  BP 110/70   Pulse 86   Temp (!) 97.3  F (36.3 C)   Ht 5\' 6"  (1.676 m)   Wt 255 lb (115.7 kg)   BMI 41.16 kg/m   BP/Weight 10/26/2020 11/30/6267 08/28/5460  Systolic BP 703 500 938  Diastolic BP 70 72 73  Wt. (Lbs) 255 253 239.1  BMI 41.16 40.84 42.35    Physical Exam Vitals reviewed.  Constitutional:      Appearance: Normal appearance. She is obese.  Cardiovascular:     Rate and Rhythm: Normal rate and regular rhythm.     Heart sounds: Normal heart sounds.  Pulmonary:     Effort: Pulmonary effort is normal.     Breath sounds: Normal breath sounds.  Abdominal:     Palpations: Abdomen is soft. There is no mass.     Tenderness: There is no abdominal tenderness.     Hernia: A hernia is present.  Neurological:     Mental Status: She is alert and oriented to person, place, and time.  Psychiatric:        Mood and Affect: Mood normal.        Behavior: Behavior normal.     Diabetic Foot Exam - Simple   No data filed      Lab Results  Component Value Date   WBC 5.6 10/12/2020   HGB 12.1 10/12/2020   HCT 37.8 10/12/2020   PLT 251 10/12/2020   GLUCOSE 92 10/12/2020   ALT 19 10/12/2020   AST 24 10/12/2020   NA 142 10/12/2020   K 4.6 10/12/2020   CL 104 10/12/2020   CREATININE 0.84 10/12/2020   BUN 13 10/12/2020   CO2 24 10/12/2020   TSH 3.510 10/12/2020      Assessment & Plan:   1. 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 Fibromyalgia Pt has failed on gabapentin, duloxetine, ibuprofen. Will work on prior authorization for [employer].  3. Generalized abdominal pain Recommend refer back to General surgery4. Morbid obesity ([place]) Start on saxenda titration. - Liraglutide -Weight Management (SAXENDA) 18 MG/3ML SOPN; Inject 1.8 mg into the skin daily.  Dispense: 9 mL; Refill: 2  5. BMI 40.0-44.9, adult ([place]) See above.  6. Neck pain Reviewed mri of neck.  Recommend return to [place] [facility] pain clinic  7. Lumbar pain  Reviewed mri of lumbar back.  Recommend return to [place] clinic.   Meds ordered this encounter  Medications  . Liraglutide -Weight Management (SAXENDA) 18 MG/3ML SOPN    Sig: Inject 1.8 mg into the skin daily.    Dispense:  9 mL    Refill:  2    Orders Placed This Encounter  Procedures  . Ambulatory referral to General Surgery     Follow-up: No follow-ups on file.  An After Visit Summary was printed and given to the patient.  Rochel Brome, MD [facility] [phone]

## 2020-10-26 NOTE — Telephone Encounter (Signed)
Pt calling and left VM. Requesting referral for hernia surgery, if possible to Dr. Noberto Retort in [place].   Harrell Lark 10/26/20 4:31 PM

## 2020-11-25 ENCOUNTER — Ambulatory Visit (INDEPENDENT_AMBULATORY_CARE_PROVIDER_SITE_OTHER): Payer: 59 | Admitting: Family Medicine

## 2020-11-25 ENCOUNTER — Encounter: Payer: Self-pay | Admitting: Family Medicine

## 2020-11-25 ENCOUNTER — Other Ambulatory Visit: Payer: Self-pay

## 2020-11-25 VITALS — BP 118/66 | HR 86 | Ht 66.0 in | Wt 257.6 lb

## 2020-11-25 DIAGNOSIS — M791 Myalgia, unspecified site: Secondary | ICD-10-CM | POA: Diagnosis not present

## 2020-11-25 DIAGNOSIS — M898X9 Other specified disorders of bone, unspecified site: Secondary | ICD-10-CM | POA: Diagnosis not present

## 2020-11-25 DIAGNOSIS — R600 Localized edema: Secondary | ICD-10-CM

## 2020-11-25 MED ORDER — FUROSEMIDE 20 MG PO TABS: 20.0000 mg | ORAL_TABLET | Freq: Every day | ORAL | 1 refills | Status: DC

## 2020-11-25 MED ORDER — LIDOCAINE 5 % EX PTCH: 1.0000 | MEDICATED_PATCH | Freq: Two times a day (BID) | CUTANEOUS | 0 refills | Status: DC | PRN

## 2020-11-25 NOTE — Progress Notes (Signed)
Acute Office Visit  Subjective:    Patient ID: Monique Kelly, female    DOB: 1956-05-08, 65 y.o.   MRN: [id number]  Chief Complaint  Patient presents with   Leg Swelling   Bone Pain    HPI Patient is in today for leg swelling, bone pain, and heaviness in legs. Patient states she is going to the beach and wants to be ahead of swelling she knows will happen while down there. The heaviness in her legs continues daily, the bone pain has been going on for 6 months and nothing helps, bone pain is only in the evening.  Tylenol 2 at night and ibuprofen.   Past Medical History:  Diagnosis Date   Atrial fibrillation [facility]) 2016   Sees Dr. [name]. Cardiac ablation, loop recorder (removed in 2022)   Atrial fibrillation with rapid ventricular response ([place]) 04/2015   Breast cancer ([place])    right lumpectomy   BRONCHITIS 03/10/2009   Qualifier: Diagnosis of  By: Geoffry Paradise MD, Kristine Royal    Chest pain with moderate risk for cardiac etiology 04/28/2015   Dehydration 06/18/2009   Qualifier: Diagnosis of  By: Pete Pelt     Depression    Family history of breast cancer    Family history of colon cancer    Fibromyalgia 2010   Hyperlipidemia    Hypothyroidism (acquired) 2006   s/p thyroidectomy for nodules   Morbid obesity ([place]) 08/10/2015   Obesity    gastric bpg 2011, weight then 400 lbs, lost 100 lbs in 60 days.   Obstructive sleep apnea    does not use cpap, does not have a following physician   Paroxysmal atrial fibrillation ([place]) 08/10/2015    Past Surgical History:  Procedure Laterality Date   ABDOMINAL HYSTERECTOMY  1992   total.    APPENDECTOMY     BREAST SURGERY  2019   right lumpectomy    CHOLECYSTECTOMY     COLONOSCOPY     ELECTROPHYSIOLOGIC STUDY N/A 09/08/2015   Procedure: Atrial Fibrillation Ablation;  Surgeon: [name], MD;  Location: [facility];  Service: Cardiovascular;  Laterality: N/A;   EP IMPLANTABLE DEVICE N/A 12/17/2015   Procedure: Loop  Recorder Insertion;  Surgeon: [name], MD;  Location: [facility];  Service: Cardiovascular;  Laterality: N/A;   ESOPHAGOGASTRODUODENOSCOPY     EXPLORATORY LAPAROTOMY WITH ABDOMINAL MASS EXCISION  2018   GASTRIC BYPASS  2011   lost 100 lbs, but gained some back.   implantable loop recorder removal  03/25/2020   removed 2022   THYROIDECTOMY  2006   for nodules ( hot and cold nodules) Goiter compressing trachea.    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 Works as Technical brewer for [employer].   Right handed   Social Determinants of Health   Financial Resource Strain: Not on file  Food Insecurity: Not on file  Transportation Needs: Not on file  Physical Activity: Not on file  Stress: Not on file  Social Connections: Not on file  Intimate Partner Violence: Not on file    Outpatient Medications Prior to Visit  Medication Sig Dispense Refill   atorvastatin (LIPITOR) 20 MG tablet Take 20 mg by mouth daily.     buPROPion (WELLBUTRIN XL) 150 MG 24 hr tablet Take 450 mg by mouth daily.     calcium carbonate (OS-CAL - DOSED IN MG OF ELEMENTAL CALCIUM) 1250 (500 Ca) MG tablet  Take 1 tablet by mouth daily with breakfast.     ergocalciferol (VITAMIN D2) 1.25 MG (50000 UT) capsule Take by mouth.     gabapentin (NEURONTIN) 300 MG capsule 3 in am and 1 at night. 1 capsule 0   ibuprofen (ADVIL) 600 MG tablet Take 600 mg by mouth every 6 (six) hours as needed.     letrozole (FEMARA) 2.5 MG tablet TAKE ONE TABLET BY MOUTH EVERY DAY 90 tablet 3   levalbuterol (XOPENEX HFA) 45 MCG/ACT inhaler Inhale 2 puffs into the lungs every 8 (eight) hours as needed.     levothyroxine (SYNTHROID) 75 MCG tablet Take 75 mcg by mouth daily.      Milnacipran HCl (SAVELLA TITRATION PACK) 12.5 & 25 & 50 MG MISC 12.5 mg on Day #1, 25 mg Daily x 1 week, then 50 mg once daily x 3 weeks (Starter pack) 1 each 0   omeprazole (PRILOSEC) 40 MG capsule Take 40 mg by mouth every morning.     vitamin B-12 (CYANOCOBALAMIN) 1000 MCG tablet Take 1,000 mcg by mouth daily.     Zoledronic Acid (RECLAST IV) Inject into the vein.     Liraglutide -Weight Management (SAXENDA) 18 MG/3ML SOPN Inject 1.8 mg into the skin daily. 9 mL 2   No facility-administered medications prior to visit.    Allergies  Allergen Reactions   Nitroglycerin     Hypotension, difficulty maintaining conciousness Hypotension, difficulty maintaining conciousness Hypotension, difficulty maintaining conciousness    Review of Systems  Constitutional:  Positive for fatigue and unexpected weight change. Negative for chills and fever.  HENT:  Negative for congestion, ear pain and sore throat.   Respiratory:  Positive for shortness of breath. Negative for cough.   Cardiovascular:  Positive for palpitations. Negative for chest pain.  Gastrointestinal:  Positive for abdominal pain, nausea and vomiting. Negative for constipation and diarrhea.  Genitourinary:  Negative for dysuria and urgency.  Musculoskeletal:  Positive for arthralgias and back pain. Negative for myalgias.  Skin:  Negative for rash.  Neurological:  Positive for weakness.  Negative for dizziness and headaches.  Psychiatric/Behavioral:  Negative for dysphoric mood. The patient is not nervous/anxious.       Objective:    Physical Exam Vitals reviewed.  Constitutional:      Appearance: Normal appearance. She is normal weight.  Neck:     Vascular: No carotid bruit.  Cardiovascular:     Rate and Rhythm: Normal rate and regular rhythm.     Pulses: Normal pulses.  Heart sounds: Normal heart sounds.  Pulmonary:     Effort: Pulmonary effort is normal. No respiratory distress.     Breath sounds: Normal breath sounds.  Abdominal:     General: Abdomen is flat. Bowel sounds are normal.     Palpations: Abdomen is soft.     Tenderness: There is no abdominal tenderness.  Musculoskeletal:        General: Tenderness (FM trigger points. tender lumbar back) present.     Right lower leg: Edema present.     Left lower leg: Edema present.  Neurological:     Mental Status: She is alert and oriented to person, place, and time.  Psychiatric:        Mood and Affect: Mood normal.        Behavior: Behavior normal.    BP 118/66   Pulse 86   Ht '5\' 6"'  (1.676 m)   Wt 257 lb 9.6 oz (116.8 kg)   SpO2 96%   BMI 41.58 kg/m  Wt Readings from Last 3 Encounters:  11/25/20 257 lb 9.6 oz (116.8 kg)  10/26/20 255 lb (115.7 kg)  10/12/20 253 lb (114.8 kg)    Health Maintenance Due  Topic Date Due   Pneumococcal Vaccine 50-84 Years old (1 - PCV) Never done   HIV Screening  Never done   Hepatitis C Screening  Never done   TETANUS/TDAP  Never done   Zoster Vaccines- Shingrix (1 of 2) Never done   PAP SMEAR-Modifier  Never done   COLONOSCOPY (Pts 45-54yr Insurance coverage will need to be confirmed)  Never done   COVID-19 Vaccine (2 - Janssen risk series) 09/11/2019    There are no preventive care reminders to display for this patient.   Lab Results  Component Value Date   TSH 3.510 10/12/2020   Lab Results  Component Value Date   WBC 6.0 11/25/2020   HGB 12.1  11/25/2020   HCT 37.1 11/25/2020   MCV 94 11/25/2020   PLT 228 11/25/2020   Lab Results  Component Value Date   NA 144 11/25/2020   K 4.6 11/25/2020   CO2 25 11/25/2020   GLUCOSE 66 11/25/2020   BUN 13 11/25/2020   CREATININE 0.92 11/25/2020   BILITOT 0.4 11/25/2020   ALKPHOS 97 11/25/2020   AST 17 11/25/2020   ALT 15 11/25/2020   PROT 6.1 11/25/2020   ALBUMIN 3.8 11/25/2020   CALCIUM 8.9 11/25/2020   ANIONGAP 11 07/25/2019   EGFR 70 11/25/2020   No results found for: CHOL No results found for: HDL No results found for: LDLCALC No results found for: TRIG No results found for: CHOLHDL No results found for: HGBA1C     Assessment & Plan:    1. Myalgia - Magnesium - CK - Phosphorus  2. Bone pain Unclear etiology  3. Morbid [place] Comprehensive metabolic panel - CBC with Differential/Platelet - VITAMIN D 25 Hydroxy (Vit-D Deficiency, Fractures)  4. Localized edema Furosemide 20 mg once daily.   5. Lumbar back pain.  Lidoderm patch ordered,   Meds ordered this encounter  Medications   lidocaine (LIDODERM) 5 %    Sig: Place 1 patch onto the skin every 12 (twelve) hours as needed. Remove & Discard patch within 12 hours or as directed by MD    Dispense:  30 patch    Refill:  0   furosemide (LASIX) 20 MG tablet    Sig: Take 1 tablet (20 mg total) by mouth daily.  Dispense:  30 tablet    Refill:  1     Rochel Brome, MD

## 2020-11-25 NOTE — Patient Instructions (Addendum)
Hold atorvastatin (lipitor) to see if muscle pain resolves.  Start on lasix 20 mg once daily as needed for swelling.  Lidoderm patch apply once daily for 12 hours and then remove for 12 hours.

## 2020-11-26 LAB — COMPREHENSIVE METABOLIC PANEL
ALT: 15 IU/L (ref 0–32)
AST: 17 IU/L (ref 0–40)
Albumin/Globulin Ratio: 1.7 (ref 1.2–2.2)
Albumin: 3.8 g/dL (ref 3.8–4.8)
Alkaline Phosphatase: 97 IU/L (ref 44–121)
BUN/Creatinine Ratio: 14 (ref 12–28)
BUN: 13 mg/dL (ref 8–27)
Bilirubin Total: 0.4 mg/dL (ref 0.0–1.2)
CO2: 25 mmol/L (ref 20–29)
Calcium: 8.9 mg/dL (ref 8.7–10.3)
Chloride: 107 mmol/L — ABNORMAL HIGH (ref 96–106)
Creatinine, Ser: 0.92 mg/dL (ref 0.57–1.00)
Globulin, Total: 2.3 g/dL (ref 1.5–4.5)
Glucose: 66 mg/dL (ref 65–99)
Potassium: 4.6 mmol/L (ref 3.5–5.2)
Sodium: 144 mmol/L (ref 134–144)
Total Protein: 6.1 g/dL (ref 6.0–8.5)
eGFR: 70 mL/min/{1.73_m2} (ref >59)

## 2020-11-26 LAB — CBC WITH DIFFERENTIAL/PLATELET
Basophils Absolute: 0.1 10*3/uL (ref 0.0–0.2)
Basos: 1 %
EOS (ABSOLUTE): 0.2 10*3/uL (ref 0.0–0.4)
Eos: 3 %
Hematocrit: 37.1 % (ref 34.0–46.6)
Hemoglobin: 12.1 g/dL (ref 11.1–15.9)
Immature Grans (Abs): 0 10*3/uL (ref 0.0–0.1)
Immature Granulocytes: 0 %
Lymphocytes Absolute: 1.7 10*3/uL (ref 0.7–3.1)
Lymphs: 28 %
MCH: 30.7 pg (ref 26.6–33.0)
MCHC: 32.6 g/dL (ref 31.5–35.7)
MCV: 94 fL (ref 79–97)
Monocytes Absolute: 0.5 10*3/uL (ref 0.1–0.9)
Monocytes: 8 %
Neutrophils Absolute: 3.6 10*3/uL (ref 1.4–7.0)
Neutrophils: 60 %
Platelets: 228 10*3/uL (ref 150–450)
RBC: 3.94 x10E6/uL (ref 3.77–5.28)
RDW: 13.6 % (ref 11.7–15.4)
WBC: 6 10*3/uL (ref 3.4–10.8)

## 2020-11-26 LAB — PHOSPHORUS: Phosphorus: 3.5 mg/dL (ref 3.0–4.3)

## 2020-11-26 LAB — MAGNESIUM: Magnesium: 1.9 mg/dL (ref 1.6–2.3)

## 2020-11-26 LAB — CK: Total CK: 55 U/L (ref 32–182)

## 2020-11-26 LAB — VITAMIN D 25 HYDROXY (VIT D DEFICIENCY, FRACTURES): Vit D, 25-Hydroxy: 47.4 ng/mL (ref 30.0–100.0)

## 2020-12-04 ENCOUNTER — Ambulatory Visit: Payer: 59 | Admitting: Neurology

## 2020-12-25 ENCOUNTER — Other Ambulatory Visit: Payer: Self-pay | Admitting: Oncology

## 2020-12-25 DIAGNOSIS — Z171 Estrogen receptor negative status [ER-]: Secondary | ICD-10-CM

## 2020-12-25 DIAGNOSIS — C50411 Malignant neoplasm of upper-outer quadrant of right female breast: Secondary | ICD-10-CM

## 2020-12-28 ENCOUNTER — Inpatient Hospital Stay: Payer: 59 | Attending: Oncology

## 2020-12-28 ENCOUNTER — Ambulatory Visit: Payer: 59 | Admitting: Oncology

## 2020-12-28 DIAGNOSIS — M48061 Spinal stenosis, lumbar region without neurogenic claudication: Secondary | ICD-10-CM | POA: Insufficient documentation

## 2020-12-28 DIAGNOSIS — R600 Localized edema: Secondary | ICD-10-CM | POA: Insufficient documentation

## 2020-12-28 DIAGNOSIS — M549 Dorsalgia, unspecified: Secondary | ICD-10-CM | POA: Insufficient documentation

## 2020-12-28 DIAGNOSIS — Z923 Personal history of irradiation: Secondary | ICD-10-CM | POA: Insufficient documentation

## 2020-12-28 DIAGNOSIS — Z79899 Other long term (current) drug therapy: Secondary | ICD-10-CM | POA: Insufficient documentation

## 2020-12-28 DIAGNOSIS — G4730 Sleep apnea, unspecified: Secondary | ICD-10-CM | POA: Insufficient documentation

## 2020-12-28 DIAGNOSIS — M797 Fibromyalgia: Secondary | ICD-10-CM | POA: Insufficient documentation

## 2020-12-28 DIAGNOSIS — Z170 Estrogen receptor positive status [ER+]: Secondary | ICD-10-CM | POA: Insufficient documentation

## 2020-12-28 DIAGNOSIS — Z8711 Personal history of peptic ulcer disease: Secondary | ICD-10-CM | POA: Insufficient documentation

## 2020-12-28 DIAGNOSIS — M5116 Intervertebral disc disorders with radiculopathy, lumbar region: Secondary | ICD-10-CM | POA: Insufficient documentation

## 2020-12-28 DIAGNOSIS — K219 Gastro-esophageal reflux disease without esophagitis: Secondary | ICD-10-CM | POA: Insufficient documentation

## 2020-12-28 DIAGNOSIS — F419 Anxiety disorder, unspecified: Secondary | ICD-10-CM | POA: Insufficient documentation

## 2020-12-28 DIAGNOSIS — Z79811 Long term (current) use of aromatase inhibitors: Secondary | ICD-10-CM | POA: Insufficient documentation

## 2020-12-28 DIAGNOSIS — C50411 Malignant neoplasm of upper-outer quadrant of right female breast: Secondary | ICD-10-CM | POA: Insufficient documentation

## 2020-12-28 DIAGNOSIS — I6782 Cerebral ischemia: Secondary | ICD-10-CM | POA: Insufficient documentation

## 2020-12-28 DIAGNOSIS — G629 Polyneuropathy, unspecified: Secondary | ICD-10-CM | POA: Insufficient documentation

## 2020-12-28 DIAGNOSIS — M85851 Other specified disorders of bone density and structure, right thigh: Secondary | ICD-10-CM | POA: Insufficient documentation

## 2020-12-28 DIAGNOSIS — R109 Unspecified abdominal pain: Secondary | ICD-10-CM | POA: Insufficient documentation

## 2020-12-28 DIAGNOSIS — Z9884 Bariatric surgery status: Secondary | ICD-10-CM | POA: Insufficient documentation

## 2021-01-04 ENCOUNTER — Other Ambulatory Visit: Payer: Self-pay

## 2021-01-04 ENCOUNTER — Telehealth: Payer: Self-pay

## 2021-01-04 DIAGNOSIS — G4733 Obstructive sleep apnea (adult) (pediatric): Secondary | ICD-10-CM

## 2021-01-04 MED ORDER — BUSPIRONE HCL 5 MG PO TABS: 5.0000 mg | ORAL_TABLET | Freq: Two times a day (BID) | ORAL | 0 refills | Status: DC

## 2021-01-04 NOTE — Telephone Encounter (Signed)
Called pt. Pt VU.   Harrell Lark 01/04/21 4:37 PM

## 2021-01-04 NOTE — Telephone Encounter (Signed)
Pt calling states she had panic attack earlier. Thinks the cause is from possibly taking oxycodone 5 mg two hours apart during night last night instead of 4 hours. States she feels jittery and nervous now. Last dose was at 5 am this morning. Has not had pain since this morning.   Spoke w/ Dr who states she is alert and breathing which is reassuring.   Reassured pt and pt VU but is questioning if there is anxiety medication she can be prescribed. Lives by herself, does not have help with medications.  Routing to provider.

## 2021-01-08 ENCOUNTER — Other Ambulatory Visit: Payer: Self-pay | Admitting: Family Medicine

## 2021-01-12 NOTE — Progress Notes (Signed)
[facility]  [address] [place],  Monique Kelly  [address] [phone]  Clinic Day:  01/20/2021  Referring physician: Rochel Brome, MD  This document serves as a record of services personally performed by Hosie Poisson, MD. It was created on their behalf by [facility], a trained medical scribe. The creation of this record is based on the scribe's personal observations and the provider's statements to them.  CHIEF COMPLAINT:  CC:  History of stage II A hormone receptor positive breast cancer.  Current Treatment:    Letrozole 2.5 mg daily.   HISTORY OF PRESENT ILLNESS:  Monique Kelly is a 65 y.o. 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  She was treated with lumpectomy.  Pathology revealed a 2.1 cm, grade 3, invasive ductal carcinoma.  Two sentinel nodes were negative for metastasis.  Estrogen and progesterone receptors were highly positive and her 2 Neu negative.  Ki 67 was 20%.  Endopredict revealed an EP clin score of 4.0, which is considered high risk and correlates with an 18% risk of distant recurrence in the next 10 years without chemotherapy.  She would get an absolute 7 % benefit with chemotherapy at 10 years.  She has a moderate risk of late recurrence of 14% in years 5-15.  She received adjuvant chemotherapy with docetaxel/cyclophosphamide (TC) for 4 cycles completed in May.  She tolerated this fairly well, except for pain for neuropathy.  The presentation of her neuropathy was unusual, with intermittent symptoms.  Bone density scan in May 2019 revealed osteopenia with a T-score of -1.9 in the spine, for which she is taking calcium and vitamin-D.   She completed adjuvant radiation in mid July.  She was started on letrozole 2.5 mg daily in mid August 2019.  After completion of chemotherapy, she developed lower extremity edema, right greater than left.  Peripheral edema is a fairly  common side effect of docetaxel.  Right lower extremity ultrasound did not reveal any evidence of deep venous thrombosis.  She was placed on furosemide 20 mg daily as needed. Furosemide was subsequently discontinued.  She also has a history of fibromyalgia with associated generalized pain, for which she is on gabapentin 300 mg 3 times daily, as well as bupropion 450 mg daily.  Tramadol was added in May, but later discontinued when she was found to have sleep apnea.     As she described numbness and electrical shocks sensation of the left inner calf, an MRI lumbosacral spine was done.  This revealed revealed multilevel degenerative disc disease with L4-5 left foraminal herniation impinging on the left L4 nerve root with moderate posterior element hypertrophy at that level with mild spinal stenosis, as well as well as spurring at L3-4.  She was referred to Neurosurgery and saw Dr. Saintclair Halsted.  She had abdominal pain and bloating, so saw Dr. Lyda Jester in October 2019.  She has a history of Roux-en-Y bariatric surgery in 2008.  Endoscopy in November of 2019 revealed a small bowel ulcer at the anastomosis.  Dr. Lyda Jester had her stop nonsteroidal anti-inflammatory medications and placed her on omeprazole 40 mg daily.  Her hemoglobin remained low at 10.6 in March 2020, repeat iron studies revealed persistent iron deficiency.  She was treated with hemocyte from March to September 2020.   Bilateral diagnostic mammogram in January 2021 did not reveal any evidence of malignancy.  She had COVID in February 2021.  Bone density scan  in July 2021 revealed worsening osteopenia with a T-score of -2.3 in the right femur neck a T-score of -2 in the total hip and a T-score of -1.1 in the spine.  Due to worsening bone density, she was started on Reclast.  A nuclear bone scan in July 2021 did not reveal any evidence of malignancy.  She has reported worsening cognitive impairment, and even discussed a possible clinical trial with our  research nurse, but decided not to pursue it.  MRI brain in January which did not reveal any acute abnormality. Mild chronic microvascular ischemic disease and generalized cerebral volume loss was seen.  INTERVAL HISTORY:  Monique Kelly is here for routine follow up and states that she underwent ventral hernia repair x2 earlier this month.  She did have an anxiety attack post procedure, and was prescribed medication, but she has only required 1 dose.  She continues letrozole daily without significant difficulty.  Se reports mild lower back pain, which she rates as a 3/10.  She has had reflux and epigastric pain at night.  I advised that she try Nexium at bedtime and keep her head elevated.  Blood counts and chemistries are unremarkable except for a hemoglobin of 11.5.  Her  appetite is poor, and she has gained 16 pounds since her last visit.  She denies fever, chills or other signs of infection.  She denies nausea, vomiting, bowel issues, or abdominal pain.  She denies sore throat, cough, dyspnea, or chest pain.  REVIEW OF SYSTEMS:  Review of Systems  Constitutional:  Positive for appetite change (poor). Negative for chills, fatigue, fever and unexpected weight change.  HENT:  Negative.    Eyes: Negative.   Respiratory: Negative.  Negative for chest tightness, cough, hemoptysis, shortness of breath and wheezing.   Cardiovascular: Negative.  Negative for chest pain, leg swelling and palpitations.  Gastrointestinal:  Positive for abdominal pain (epigastric, at night, and reflux). Negative for abdominal distention, blood in stool, constipation, diarrhea, nausea and vomiting.  Endocrine: Negative.   Genitourinary: Negative.  Negative for difficulty urinating, dysuria, frequency and hematuria.   Musculoskeletal:  Positive for back pain (lower, mild). Negative for arthralgias, flank pain, gait problem and myalgias.  Skin: Negative.   Neurological: Negative.  Negative for dizziness, extremity weakness, gait  problem, headaches, light-headedness, numbness, seizures and speech difficulty.  Hematological: Negative.   Psychiatric/Behavioral:  Positive for depression (on medication). Negative for sleep disturbance. The patient is nervous/anxious (on medication).     VITALS:  Blood pressure 122/82, pulse 82, temperature 98.2 F (36.8 C), temperature source Oral, resp. rate 20, height '5\' 6"'$  (1.676 m), weight 255 lb 3.2 oz (115.8 kg), SpO2 94 %.  Wt Readings from Last 3 Encounters:  01/20/21 255 lb 3.2 oz (115.8 kg)  11/25/20 257 lb 9.6 oz (116.8 kg)  10/26/20 255 lb (115.7 kg)    Body mass index is 41.19 kg/m.  Performance status (ECOG): 1 - Symptomatic but completely ambulatory  PHYSICAL EXAM:  Physical Exam Constitutional:      General: She is not in acute distress.    Appearance: Normal appearance. She is normal weight.  HENT:     Head: Normocephalic and atraumatic.  Eyes:     General: No scleral icterus.    Extraocular Movements: Extraocular movements intact.     Conjunctiva/sclera: Conjunctivae normal.     Pupils: Pupils are equal, round, and reactive to light.  Cardiovascular:     Rate and Rhythm: Normal rate. Rhythm irregularly irregular.  Pulses: Normal pulses.     Heart sounds: Normal heart sounds. No murmur heard.   No friction rub. No gallop.  Pulmonary:     Effort: Pulmonary effort is normal. No respiratory distress.     Breath sounds: Normal breath sounds.  Chest:     Comments: Deep scar at the central right breast at the nipple areolar complex which is well healed.  Mild fibrocystic changes in the left breast. Abdominal:     General: Bowel sounds are normal. There is no distension.     Palpations: Abdomen is soft. There is no hepatomegaly, splenomegaly or mass.     Tenderness: There is no abdominal tenderness.  Musculoskeletal:        General: Normal range of motion.     Cervical back: Normal range of motion and neck supple.     Right lower leg: Edema (mild)  present.     Left lower leg: Edema (mild) present.  Lymphadenopathy:     Cervical: No cervical adenopathy.  Skin:    General: Skin is warm and dry.  Neurological:     General: No focal deficit present.     Mental Status: She is alert and oriented to person, place, and time. Mental status is at baseline.  Psychiatric:        Mood and Affect: Mood normal.        Behavior: Behavior normal.        Thought Content: Thought content normal.        Judgment: Judgment normal.   LABS:   CBC Latest Ref Rng & Units 01/20/2021 11/25/2020 10/12/2020  WBC - 5.4 6.0 5.6  Hemoglobin 12.0 - 16.0 11.5(A) 12.1 12.1  Hematocrit 36 - 46 34(A) 37.1 37.8  Platelets 150 - 399 271 228 251   CMP Latest Ref Rng & Units 01/20/2021 11/25/2020 10/12/2020  Glucose 65 - 99 mg/dL - 66 92  BUN 4 - '21 16 13 13  '$ Creatinine 0.5 - 1.1 1.0 0.92 0.84  Sodium 137 - 147 142 144 142  Potassium 3.4 - 5.3 4.2 4.6 4.6  Chloride 99 - 108 104 107(H) 104  CO2 13 - 22 29(A) 25 24  Calcium 8.7 - 10.7 8.7 8.9 9.2  Total Protein 6.0 - 8.5 g/dL - 6.1 6.4  Total Bilirubin 0.0 - 1.2 mg/dL - 0.4 0.5  Alkaline Phos 25 - 125 114 97 94  AST 13 - 35 '18 17 24  '$ ALT 7 - 35 '9 15 19    '$ STUDIES:  No results found.   HISTORY:   Allergies:  Allergies  Allergen Reactions   Nitroglycerin     Hypotension, difficulty maintaining conciousness Hypotension, difficulty maintaining conciousness Hypotension, difficulty maintaining conciousness    Current Medications: Current Outpatient Medications  Medication Sig Dispense Refill   atorvastatin (LIPITOR) 20 MG tablet Take 20 mg by mouth daily.     buPROPion (WELLBUTRIN XL) 150 MG 24 hr tablet Take 450 mg by mouth daily.     busPIRone (BUSPAR) 5 MG tablet Take 1 tablet (5 mg total) by mouth 2 (two) times daily. 60 tablet 0   calcium carbonate (OS-CAL - DOSED IN MG OF ELEMENTAL CALCIUM) 1250 (500 Ca) MG tablet Take 1 tablet by mouth daily with breakfast.     ergocalciferol (VITAMIN D2) 1.25 MG  (50000 UT) capsule Take by mouth.     furosemide (LASIX) 20 MG tablet Take 1 tablet (20 mg total) by mouth daily. 30 tablet 1   gabapentin (NEURONTIN) 300  MG capsule 3 in am and 1 at night. 1 capsule 0   ibuprofen (ADVIL) 600 MG tablet TAKE ONE TABLET BY MOUTH EVERY 6-8 HOURS AS NEEDED FOR PAIN 90 tablet 2   letrozole (FEMARA) 2.5 MG tablet TAKE ONE TABLET BY MOUTH EVERY DAY 90 tablet 3   levalbuterol (XOPENEX HFA) 45 MCG/ACT inhaler Inhale 2 puffs into the lungs every 8 (eight) hours as needed.     levothyroxine (SYNTHROID) 75 MCG tablet Take 75 mcg by mouth daily.      lidocaine (LIDODERM) 5 % Place 1 patch onto the skin every 12 (twelve) hours as needed. Remove & Discard patch within 12 hours or as directed by MD 30 patch 0   Milnacipran HCl (SAVELLA TITRATION PACK) 12.5 & 25 & 50 MG MISC 12.5 mg on Day #1, 25 mg Daily x 1 week, then 50 mg once daily x 3 weeks (Starter pack) 1 each 0   omeprazole (PRILOSEC) 40 MG capsule Take 40 mg by mouth every morning.     vitamin B-12 (CYANOCOBALAMIN) 1000 MCG tablet Take 1,000 mcg by mouth daily.     Zoledronic Acid (RECLAST IV) Inject into the vein.     No current facility-administered medications for this visit.     ASSESSMENT & PLAN:   Assessment:   1. 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 She remains without evidence of recurrence. She will continue letrozole 2.5 mg daily for at least 5 years, until August 2024.  2. Radiculopathy at L4-5 with foraminal herniation impinging the left L4 nerve root.  She has spinal stenosis and severe degenerative disc disease.  3. Neuropathy of the lower extremities, which is stable.  The original presentation was unusual with intermittent symptoms.  4. Osteopenia, for which she was started on yearly Reclast in July 2021. She continues OsCal daily, and I advised that she increase this to BID.  She will be due for repeat bone density in July 2023.  5. Chronic anxiety and depression,  controlled with medication.  6. Bone pain, which is stable. This is felt to be more likely is related to her letrozole.  Nuclear bone scan did not reveal any evidence of bone metastasis.  7.  Epigastric pain at night, and intermittent reflux. She has a history of peptic ulcer disease.  I advised that she try Nexium at bedtime and keep her head elevated.   Plan:    She knows to continue letrozole daily, and will increase her oral calcium supplement to BID.  We will schedule her for annual Reclast.  As she has had significant weight gain, I will add a TSH to her labs today.  We flushed her port today, and she knows she may have this removed at any time if she wishes.  For her reflux, I advised that she try Nexium at bedtime.  We will see her back in February 2023 with CBC, comprehensive metabolic panel and bilateral mammogram for repeat evaluation.  The patient understands the plans discussed today and is in agreement with them.  She knows to contact our office if she develops concerns prior to her next appointment.   I, Caela Ohara, am acting as scribe for Derwood Kaplan, MD  I have reviewed this report as typed by the medical scribe, and it is complete and accurate.

## 2021-01-20 ENCOUNTER — Encounter: Payer: Self-pay | Admitting: Oncology

## 2021-01-20 ENCOUNTER — Other Ambulatory Visit: Payer: Self-pay | Admitting: Hematology and Oncology

## 2021-01-20 ENCOUNTER — Inpatient Hospital Stay (INDEPENDENT_AMBULATORY_CARE_PROVIDER_SITE_OTHER): Payer: 59 | Admitting: Oncology

## 2021-01-20 ENCOUNTER — Other Ambulatory Visit: Payer: Self-pay | Admitting: Oncology

## 2021-01-20 ENCOUNTER — Inpatient Hospital Stay: Payer: 59

## 2021-01-20 VITALS — BP 122/82 | HR 82 | Temp 98.2°F | Resp 20 | Ht 66.0 in | Wt 255.2 lb

## 2021-01-20 DIAGNOSIS — M549 Dorsalgia, unspecified: Secondary | ICD-10-CM | POA: Diagnosis not present

## 2021-01-20 DIAGNOSIS — Z170 Estrogen receptor positive status [ER+]: Secondary | ICD-10-CM

## 2021-01-20 DIAGNOSIS — G4730 Sleep apnea, unspecified: Secondary | ICD-10-CM | POA: Diagnosis not present

## 2021-01-20 DIAGNOSIS — I6782 Cerebral ischemia: Secondary | ICD-10-CM | POA: Diagnosis not present

## 2021-01-20 DIAGNOSIS — C50411 Malignant neoplasm of upper-outer quadrant of right female breast: Secondary | ICD-10-CM | POA: Diagnosis present

## 2021-01-20 DIAGNOSIS — Z9884 Bariatric surgery status: Secondary | ICD-10-CM | POA: Diagnosis not present

## 2021-01-20 DIAGNOSIS — Z923 Personal history of irradiation: Secondary | ICD-10-CM | POA: Diagnosis not present

## 2021-01-20 DIAGNOSIS — E538 Deficiency of other specified B group vitamins: Secondary | ICD-10-CM

## 2021-01-20 DIAGNOSIS — G629 Polyneuropathy, unspecified: Secondary | ICD-10-CM | POA: Diagnosis not present

## 2021-01-20 DIAGNOSIS — R109 Unspecified abdominal pain: Secondary | ICD-10-CM | POA: Diagnosis not present

## 2021-01-20 DIAGNOSIS — E039 Hypothyroidism, unspecified: Secondary | ICD-10-CM

## 2021-01-20 DIAGNOSIS — M5116 Intervertebral disc disorders with radiculopathy, lumbar region: Secondary | ICD-10-CM | POA: Diagnosis not present

## 2021-01-20 DIAGNOSIS — K219 Gastro-esophageal reflux disease without esophagitis: Secondary | ICD-10-CM | POA: Diagnosis not present

## 2021-01-20 DIAGNOSIS — M48061 Spinal stenosis, lumbar region without neurogenic claudication: Secondary | ICD-10-CM | POA: Diagnosis not present

## 2021-01-20 DIAGNOSIS — Z8711 Personal history of peptic ulcer disease: Secondary | ICD-10-CM | POA: Diagnosis not present

## 2021-01-20 DIAGNOSIS — Z171 Estrogen receptor negative status [ER-]: Secondary | ICD-10-CM

## 2021-01-20 DIAGNOSIS — R600 Localized edema: Secondary | ICD-10-CM | POA: Diagnosis not present

## 2021-01-20 DIAGNOSIS — M797 Fibromyalgia: Secondary | ICD-10-CM | POA: Diagnosis not present

## 2021-01-20 DIAGNOSIS — F419 Anxiety disorder, unspecified: Secondary | ICD-10-CM | POA: Diagnosis not present

## 2021-01-20 DIAGNOSIS — M85851 Other specified disorders of bone density and structure, right thigh: Secondary | ICD-10-CM | POA: Diagnosis not present

## 2021-01-20 DIAGNOSIS — Z79811 Long term (current) use of aromatase inhibitors: Secondary | ICD-10-CM | POA: Diagnosis not present

## 2021-01-20 DIAGNOSIS — Z79899 Other long term (current) drug therapy: Secondary | ICD-10-CM | POA: Diagnosis not present

## 2021-01-20 LAB — CBC AND DIFFERENTIAL
HCT: 34 — AB (ref 36–46)
Hemoglobin: 11.5 — AB (ref 12.0–16.0)
Neutrophils Absolute: 3.46
Platelets: 271 (ref 150–399)
WBC: 5.4

## 2021-01-20 LAB — BASIC METABOLIC PANEL
BUN: 16 (ref 4–21)
CO2: 29 — AB (ref 13–22)
Chloride: 104 (ref 99–108)
Creatinine: 1 (ref 0.5–1.1)
Glucose: 83
Potassium: 4.2 (ref 3.4–5.3)
Sodium: 142 (ref 137–147)

## 2021-01-20 LAB — FOLATE: Folate: 13 ng/mL (ref >5.9)

## 2021-01-20 LAB — HEPATIC FUNCTION PANEL
ALT: 9 (ref 7–35)
AST: 18 (ref 13–35)
Alkaline Phosphatase: 114 (ref 25–125)
Bilirubin, Total: 0.5

## 2021-01-20 LAB — CBC: RBC: 3.62 — AB (ref 3.87–5.11)

## 2021-01-20 LAB — FERRITIN: Ferritin: 12 ng/mL (ref 11–307)

## 2021-01-20 LAB — COMPREHENSIVE METABOLIC PANEL
Albumin: 3.4 — AB (ref 3.5–5.0)
Calcium: 8.7 (ref 8.7–10.7)

## 2021-01-20 LAB — IRON AND TIBC
Iron: 55 ug/dL (ref 28–170)
Saturation Ratios: 15 % (ref 10.4–31.8)
TIBC: 378 ug/dL (ref 250–450)
UIBC: 323 ug/dL

## 2021-01-20 LAB — VITAMIN B12: Vitamin B-12: 2295 pg/mL — ABNORMAL HIGH (ref 180–914)

## 2021-01-20 LAB — TSH: TSH: 1.581 u[IU]/mL (ref 0.350–4.500)

## 2021-01-20 NOTE — Addendum Note (Signed)
Addended by: Juanetta Beets on: 01/20/2021 03:09 PM   Modules accepted: Orders

## 2021-01-21 ENCOUNTER — Other Ambulatory Visit: Payer: Self-pay | Admitting: Oncology

## 2021-01-21 DIAGNOSIS — D509 Iron deficiency anemia, unspecified: Secondary | ICD-10-CM | POA: Insufficient documentation

## 2021-01-22 ENCOUNTER — Encounter: Payer: Self-pay | Admitting: Hematology and Oncology

## 2021-01-22 NOTE — Addendum Note (Signed)
Addended by: Juanetta Beets on: 01/22/2021 05:08 PM   Modules accepted: Orders

## 2021-01-24 ENCOUNTER — Encounter: Payer: Self-pay | Admitting: Hematology and Oncology

## 2021-01-26 ENCOUNTER — Telehealth: Payer: Self-pay

## 2021-01-26 NOTE — Telephone Encounter (Signed)
-----   Message from Derwood Kaplan, MD sent at 01/21/2021  7:16 PM EDT ----- Regarding: call pt Tell her B12 too high, rec she stop it.  Her thyroid is okay but her iron looks low.  I rec.she get on 1 daily and we should check her stools for blood,(I will put in order), she may need GI referral

## 2021-01-27 ENCOUNTER — Telehealth: Payer: Self-pay

## 2021-01-27 NOTE — Telephone Encounter (Signed)
-----   Message from Derwood Kaplan, MD sent at 01/21/2021  7:16 PM EDT ----- Regarding: call pt Tell her B12 too high, rec she stop it.  Her thyroid is okay but her iron looks low.  I rec.she get on 1 daily and we should check her stools for blood,(I will put in order), she may need GI referral

## 2021-01-28 ENCOUNTER — Inpatient Hospital Stay: Payer: 59 | Attending: Oncology

## 2021-01-28 ENCOUNTER — Telehealth: Payer: Self-pay

## 2021-01-28 ENCOUNTER — Other Ambulatory Visit: Payer: Self-pay | Admitting: Pharmacist

## 2021-01-28 ENCOUNTER — Other Ambulatory Visit: Payer: Self-pay

## 2021-01-28 VITALS — BP 126/71 | HR 84 | Temp 98.2°F | Resp 20 | Ht 66.0 in | Wt 260.0 lb

## 2021-01-28 DIAGNOSIS — R609 Edema, unspecified: Secondary | ICD-10-CM | POA: Diagnosis not present

## 2021-01-28 DIAGNOSIS — Z170 Estrogen receptor positive status [ER+]: Secondary | ICD-10-CM | POA: Diagnosis not present

## 2021-01-28 DIAGNOSIS — R109 Unspecified abdominal pain: Secondary | ICD-10-CM | POA: Insufficient documentation

## 2021-01-28 DIAGNOSIS — M549 Dorsalgia, unspecified: Secondary | ICD-10-CM | POA: Diagnosis not present

## 2021-01-28 DIAGNOSIS — F32A Depression, unspecified: Secondary | ICD-10-CM | POA: Diagnosis not present

## 2021-01-28 DIAGNOSIS — Z79899 Other long term (current) drug therapy: Secondary | ICD-10-CM | POA: Diagnosis not present

## 2021-01-28 DIAGNOSIS — F419 Anxiety disorder, unspecified: Secondary | ICD-10-CM | POA: Diagnosis not present

## 2021-01-28 DIAGNOSIS — C50411 Malignant neoplasm of upper-outer quadrant of right female breast: Secondary | ICD-10-CM | POA: Diagnosis present

## 2021-01-28 DIAGNOSIS — G629 Polyneuropathy, unspecified: Secondary | ICD-10-CM | POA: Diagnosis not present

## 2021-01-28 DIAGNOSIS — Z780 Asymptomatic menopausal state: Secondary | ICD-10-CM

## 2021-01-28 MED ORDER — ZOLEDRONIC ACID 4 MG/100ML IV SOLN
4.0000 mg | Freq: Once | INTRAVENOUS | Status: AC
  Administered 2021-01-28: 4 mg via INTRAVENOUS
  Filled 2021-01-28: qty 100

## 2021-01-28 MED ORDER — SODIUM CHLORIDE 0.9 % IV SOLN: Freq: Once | INTRAVENOUS | Status: AC

## 2021-01-28 NOTE — Patient Instructions (Signed)

## 2021-01-28 NOTE — Telephone Encounter (Signed)
Patient states she has already been notified

## 2021-01-28 NOTE — Telephone Encounter (Signed)
-----   Message from Derwood Kaplan, MD sent at 01/21/2021  7:16 PM EDT ----- Regarding: call pt Tell her B12 too high, rec she stop it.  Her thyroid is okay but her iron looks low.  I rec.she get on 1 daily and we should check her stools for blood,(I will put in order), she may need GI referral

## 2021-02-01 ENCOUNTER — Other Ambulatory Visit: Payer: Self-pay | Admitting: Family Medicine

## 2021-02-02 ENCOUNTER — Encounter: Payer: Self-pay | Admitting: Family Medicine

## 2021-02-02 ENCOUNTER — Other Ambulatory Visit: Payer: Self-pay | Admitting: Family Medicine

## 2021-02-02 ENCOUNTER — Ambulatory Visit (INDEPENDENT_AMBULATORY_CARE_PROVIDER_SITE_OTHER): Payer: 59 | Admitting: Family Medicine

## 2021-02-02 ENCOUNTER — Other Ambulatory Visit: Payer: Self-pay

## 2021-02-02 VITALS — BP 116/78 | HR 88 | Temp 96.2°F | Resp 16 | Ht 66.0 in | Wt 257.0 lb

## 2021-02-02 DIAGNOSIS — E538 Deficiency of other specified B group vitamins: Secondary | ICD-10-CM

## 2021-02-02 DIAGNOSIS — E782 Mixed hyperlipidemia: Secondary | ICD-10-CM

## 2021-02-02 DIAGNOSIS — G4733 Obstructive sleep apnea (adult) (pediatric): Secondary | ICD-10-CM

## 2021-02-02 DIAGNOSIS — E039 Hypothyroidism, unspecified: Secondary | ICD-10-CM | POA: Diagnosis not present

## 2021-02-02 DIAGNOSIS — Z170 Estrogen receptor positive status [ER+]: Secondary | ICD-10-CM

## 2021-02-02 DIAGNOSIS — I4811 Longstanding persistent atrial fibrillation: Secondary | ICD-10-CM

## 2021-02-02 DIAGNOSIS — I480 Paroxysmal atrial fibrillation: Secondary | ICD-10-CM | POA: Diagnosis not present

## 2021-02-02 DIAGNOSIS — M5416 Radiculopathy, lumbar region: Secondary | ICD-10-CM

## 2021-02-02 DIAGNOSIS — Z23 Encounter for immunization: Secondary | ICD-10-CM

## 2021-02-02 DIAGNOSIS — R1013 Epigastric pain: Secondary | ICD-10-CM | POA: Diagnosis not present

## 2021-02-02 DIAGNOSIS — C50411 Malignant neoplasm of upper-outer quadrant of right female breast: Secondary | ICD-10-CM

## 2021-02-02 DIAGNOSIS — Z0000 Encounter for general adult medical examination without abnormal findings: Secondary | ICD-10-CM

## 2021-02-02 MED ORDER — OMEPRAZOLE 40 MG PO CPDR: 40.0000 mg | DELAYED_RELEASE_CAPSULE | Freq: Two times a day (BID) | ORAL | 3 refills | Status: DC

## 2021-02-02 MED ORDER — LIDOCAINE 5 % EX PTCH: 1.0000 | MEDICATED_PATCH | Freq: Two times a day (BID) | CUTANEOUS | 2 refills | Status: DC | PRN

## 2021-02-02 MED ORDER — GABAPENTIN 300 MG PO CAPS: ORAL_CAPSULE | ORAL | 1 refills | Status: DC

## 2021-02-02 NOTE — Patient Instructions (Addendum)
Stop ibuprofen.  Stop alleve.  May take tylenol for back pain as directions on bottle. Increase omeprazole to 40 mg one twice a day.  Ordering lidoderm patch.

## 2021-02-02 NOTE — Progress Notes (Signed)
Subjective:  Patient ID: Monique Kelly, female    DOB: 1956/05/22  Age: 65 y.o. MRN: [id number]  Chief Complaint  Patient presents with   Hyperlipidemia    HPI Patient is complaining epigastric pain 3 times a week for a yr. Sometimes lasts all night. No associated sob, diaphoresis, nausea, vomiting. No triggers. No radiation. Has not tried any medicine. History of stomach ulcer 5 yrs ago. Dr. Martinique (previous pcp) put her on ibuprofen 600 mg 2 at night and aleve 2 at night. Dr. Lyda Jester. 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  Breast Cancer: treated by Dr. Hinton Rao. Currently on letrozole daily.  Atrial fibrillation: pt had ablation (2017.) No palpitations. No blood thinners or meds for rate control. Cardiologist: [name], MD  [place] forward into the cough. Hit her left forehead on the couch. Did not go to the hospital.  Hyperlipidemia: trying to eat healthy. Held lipitor at the end of July which did help her muscle pain.  Current Outpatient Medications on File Prior to Visit  Medication Sig Dispense Refill   buPROPion (WELLBUTRIN XL) 150 MG 24 hr tablet Take 450 mg by mouth daily.     busPIRone (BUSPAR) 5 MG tablet Take 1 tablet (5 mg total) by mouth 2 (two) times daily. 60 tablet 0   calcium carbonate (OS-CAL - DOSED IN MG OF ELEMENTAL CALCIUM) 1250 (500 Ca) MG tablet Take 1 tablet by mouth daily with breakfast.     ergocalciferol (VITAMIN D2) 1.25 MG (50000 UT) capsule Take by mouth.     levothyroxine (SYNTHROID) 75 MCG tablet Take 75 mcg by mouth daily.      Zoledronic Acid (RECLAST IV) Inject into the vein.     cyanocobalamin 1000 MCG tablet Take by mouth.     letrozole (FEMARA) 2.5 MG tablet Take by mouth.     No current facility-administered medications on file prior to visit.   Past Medical History:  Diagnosis Date   Atrial fibrillation [facility]) 2016   Sees Dr. [name]. Cardiac ablation, loop recorder  (removed in 2022)   Atrial fibrillation with rapid ventricular response ([place]) 04/2015   Breast cancer ([place])    right lumpectomy   BRONCHITIS 03/10/2009   Qualifier: Diagnosis of  By: Geoffry Paradise MD, Kristine Royal    Chest pain with moderate risk for cardiac etiology 04/28/2015   Dehydration 06/18/2009   Qualifier: Diagnosis of  By: Pete Pelt     Depression    Family history of breast cancer    Family history of colon cancer    Fibromyalgia 2010   Hyperlipidemia    Hypothyroidism (acquired) 2006   s/p thyroidectomy for nodules   Morbid obesity ([place]) 08/10/2015   Obesity    gastric bpg 2011, weight then 400 lbs, lost 100 lbs in 60 days.   Obstructive sleep apnea    does not use cpap, does not have a following physician   Paroxysmal atrial fibrillation ([place]) 08/10/2015   Past Surgical History:  Procedure Laterality Date   ABDOMINAL HYSTERECTOMY  1992   total.    APPENDECTOMY     BREAST SURGERY  2019   right lumpectomy    CHOLECYSTECTOMY     COLONOSCOPY     ELECTROPHYSIOLOGIC STUDY N/A 09/08/2015   Procedure: Atrial Fibrillation Ablation;  Surgeon: [name], MD;  Location: [place] [facility];  Service: Cardiovascular;  Laterality: N/A;   EP IMPLANTABLE DEVICE N/A 12/17/2015   Procedure: Loop Recorder  Insertion;  Surgeon: [name], MD;  Location: [place] [facility];  Service: Cardiovascular;  Laterality: N/A;   ESOPHAGOGASTRODUODENOSCOPY     EXPLORATORY LAPAROTOMY WITH ABDOMINAL MASS EXCISION  2018   GASTRIC BYPASS  2011   lost 100 lbs, but gained some back.   implantable loop recorder removal  03/25/2020   removed 2022   THYROIDECTOMY  2006   for nodules ( hot and cold nodules) Goiter compressing trachea.    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 Works as Technical brewer for [employer].   Right handed   Social Determinants of Health   Financial Resource Strain: Not on file  Food Insecurity: Not on file  Transportation Needs: Not on file  Physical Activity: Not on file  Stress: Not on file  Social Connections: Not on file    Review of Systems  Constitutional:  Positive for fatigue. Negative for chills and fever.  HENT:  Negative for congestion, rhinorrhea and sore throat.   Respiratory:  Negative for cough and shortness of breath.   Cardiovascular:  Negative for chest pain and palpitations.  Gastrointestinal:  Positive for abdominal pain. Negative for constipation, diarrhea, nausea and vomiting.  Genitourinary:  Negative for dysuria and urgency.       Poor bladder control.  Has to wait for bladder to kick in while sitting on the toilet.   Musculoskeletal:  Positive for back pain (Chronic. She feels it is due to weight gain. Had for almost a year. Has neck pain also with fibromyalgia.). Negative for myalgias.  Neurological:  Positive for weakness and numbness (left leg (1 1/2 yrs. )). Negative for dizziness, light-headedness and headaches.       Balance issues  Psychiatric/Behavioral:  Positive for dysphoric mood. The patient is not nervous/anxious.     Objective:  BP 116/78   Pulse 88   Temp (!) 96.2 F (35.7 C)   Resp 16   Ht '5\' 6"'$  (1.676 m)    Wt 257 lb (116.6 kg)   BMI 41.48 kg/m   BP/Weight 02/08/2021 [id number]  Systolic BP [id number]  Diastolic BP 80 78 71  Wt. (Lbs) 258 257 260  BMI 40.41 41.48 41.97    Physical Exam Vitals reviewed.  Constitutional:      Appearance: Normal appearance. She is normal weight.  Neck:     Vascular: No carotid bruit.  Cardiovascular:     Rate and Rhythm: Normal rate and  regular rhythm.     Pulses: Normal pulses.     Heart sounds: Normal heart sounds.  Pulmonary:     Effort: Pulmonary effort is normal. No respiratory distress.     Breath sounds: Normal breath sounds.  Abdominal:     General: Abdomen is flat. Bowel sounds are normal.     Palpations: Abdomen is soft.     Tenderness: There is abdominal tenderness (epigastric).  Neurological:     Mental Status: She is alert and oriented to person, place, and time.  Psychiatric:        Mood and Affect: Mood normal.        Behavior: Behavior normal.    Diabetic Foot Exam - Simple   No data filed      Lab Results  Component Value Date   WBC 6.7 02/02/2021   HGB 11.9 02/02/2021   HCT 36.3 02/02/2021   PLT 256 02/02/2021   GLUCOSE 98 02/02/2021   CHOL 221 (H) 02/02/2021   TRIG 108 02/02/2021   HDL 45 02/02/2021   LDLCALC 157 (H) 02/02/2021   ALT 7 02/02/2021   AST 13 02/02/2021   NA 144 02/02/2021   K 4.0 02/02/2021   CL 107 (H) 02/02/2021   CREATININE 0.89 02/02/2021   BUN 15 02/02/2021   CO2 25 02/02/2021   TSH 1.581 01/20/2021      Assessment & Plan:   Problem List Items Addressed This Visit       Cardiovascular and Mediastinum   A-fib ([place])    The current medical regimen is effective;  continue present plan and medications.       Relevant Orders   EKG 12-Lead (Completed)   RESOLVED: Paroxysmal atrial fibrillation (HCC)   Relevant Orders   EKG 12-Lead (Completed)     Endocrine   Hypothyroidism (acquired) - Primary    The current medical regimen is effective;  continue present plan and  medications.         Nervous and Auditory   Lumbar radiculopathy    Ordering lidoderm patch.  No nsaids.  Start on tylenol.      Relevant Medications   gabapentin (NEURONTIN) 300 MG capsule     Other   Epigastric abdominal pain    Stop ibuprofen.  Stop alleve.  May take tylenol for back pain as directions on bottle.  Increase omeprazole to 40 mg one twice a day.         Relevant Orders   CBC with Differential/Platelet (Completed)   HELICOBACTER PYLORI  ANTIBODY, IGM (Completed)   Mixed hyperlipidemia    Low fat diet.       Relevant Orders   Comprehensive metabolic panel (Completed)   Lipid panel (Completed)   Other Visit Diagnoses     Need for influenza vaccination       Relevant Orders   Flu Vaccine MDCK QUAD PF (Completed)     .  Meds ordered this encounter  Medications   omeprazole (PRILOSEC) 40 MG capsule    Sig: Take 1 capsule (40 mg total) by mouth in the morning and at bedtime.    Dispense:  180 capsule    Refill:  3   gabapentin (NEURONTIN) 300 MG capsule    Sig: 3 in am and 1 at night.    Dispense:  360 capsule    Refill:  1   lidocaine (LIDODERM) 5 %    Sig: Place 1 patch onto the skin every 12 (twelve) hours as needed. Remove &  Discard patch within 12 hours or as directed by MD    Dispense:  30 patch    Refill:  2    Orders Placed This Encounter  Procedures   Flu Vaccine MDCK QUAD PF   CBC with Differential/Platelet   Comprehensive metabolic panel   Lipid panel   HELICOBACTER PYLORI  ANTIBODY, IGM   Cardiovascular Risk Assessment   EKG 12-Lead     Follow-up: Return in 4 weeks (on 03/02/2021) for chronic follow up.  An After Visit Summary was printed and given to the patient.  Rochel Brome, MD [facility] [phone]

## 2021-02-02 NOTE — Assessment & Plan Note (Signed)
Low fat diet

## 2021-02-03 ENCOUNTER — Telehealth: Payer: Self-pay | Admitting: Internal Medicine

## 2021-02-03 LAB — CBC WITH DIFFERENTIAL/PLATELET
Basophils Absolute: 0.1 10*3/uL (ref 0.0–0.2)
Basos: 1 %
EOS (ABSOLUTE): 0.3 10*3/uL (ref 0.0–0.4)
Eos: 4 %
Hematocrit: 36.3 % (ref 34.0–46.6)
Hemoglobin: 11.9 g/dL (ref 11.1–15.9)
Immature Grans (Abs): 0 10*3/uL (ref 0.0–0.1)
Immature Granulocytes: 0 %
Lymphocytes Absolute: 1.3 10*3/uL (ref 0.7–3.1)
Lymphs: 19 %
MCH: 30.4 pg (ref 26.6–33.0)
MCHC: 32.8 g/dL (ref 31.5–35.7)
MCV: 93 fL (ref 79–97)
Monocytes Absolute: 0.4 10*3/uL (ref 0.1–0.9)
Monocytes: 6 %
Neutrophils Absolute: 4.7 10*3/uL (ref 1.4–7.0)
Neutrophils: 70 %
Platelets: 256 10*3/uL (ref 150–450)
RBC: 3.91 x10E6/uL (ref 3.77–5.28)
RDW: 13.2 % (ref 11.7–15.4)
WBC: 6.7 10*3/uL (ref 3.4–10.8)

## 2021-02-03 LAB — COMPREHENSIVE METABOLIC PANEL
ALT: 7 IU/L (ref 0–32)
AST: 13 IU/L (ref 0–40)
Albumin/Globulin Ratio: 1.4 (ref 1.2–2.2)
Albumin: 3.6 g/dL — ABNORMAL LOW (ref 3.8–4.8)
Alkaline Phosphatase: 113 IU/L (ref 44–121)
BUN/Creatinine Ratio: 17 (ref 12–28)
BUN: 15 mg/dL (ref 8–27)
Bilirubin Total: 0.5 mg/dL (ref 0.0–1.2)
CO2: 25 mmol/L (ref 20–29)
Calcium: 8.6 mg/dL — ABNORMAL LOW (ref 8.7–10.3)
Chloride: 107 mmol/L — ABNORMAL HIGH (ref 96–106)
Creatinine, Ser: 0.89 mg/dL (ref 0.57–1.00)
Globulin, Total: 2.5 g/dL (ref 1.5–4.5)
Glucose: 98 mg/dL (ref 65–99)
Potassium: 4 mmol/L (ref 3.5–5.2)
Sodium: 144 mmol/L (ref 134–144)
Total Protein: 6.1 g/dL (ref 6.0–8.5)
eGFR: 72 mL/min/{1.73_m2} (ref >59)

## 2021-02-03 LAB — CARDIOVASCULAR RISK ASSESSMENT

## 2021-02-03 LAB — LIPID PANEL
Chol/HDL Ratio: 4.9 ratio — ABNORMAL HIGH (ref 0.0–4.4)
Cholesterol, Total: 221 mg/dL — ABNORMAL HIGH (ref 100–199)
HDL: 45 mg/dL (ref >39)
LDL Chol Calc (NIH): 157 mg/dL — ABNORMAL HIGH (ref 0–99)
Triglycerides: 108 mg/dL (ref 0–149)
VLDL Cholesterol Cal: 19 mg/dL (ref 5–40)

## 2021-02-03 LAB — HELICOBACTER PYLORI  ANTIBODY, IGM: H pylori, IgM Abs: <9 units (ref 0.0–8.9)

## 2021-02-03 NOTE — Telephone Encounter (Signed)
Per patient schedule message:  "My oncologist heard skipped heartbeats during annual checkup and suggested I move my appointment up. My PCP found me to be in afib during an exam and suggested I move my cardiologist appointment up.  On 02/02/21 I was at my pcp for a check up. When the nurse took my pulse she said I was in afib and told Dr Tobie Poet. An ekg was done in her office which confirmed afib. At the time I had no symptoms. I had experienced occasional dizziness with pressure pain in the chest but no palpitations. Dr. Tobie Poet told me she wanted to alert Dr Rayann Heman and move my appointment up. The week prior I had seen Dr Anabel Bene my oncologist for a 6 month checkup. She mentioned hearing irregular heartbeats and also suggested I request my appointment be moved up. I was diagnosed by Dr Rayann Heman with afib in 2016; had an abalation in 2017; and a loop recorder in 2019."  Patient c/o Palpitations:  High priority if patient c/o lightheadedness, shortness of breath, or chest pain  How long have you had palpitations/irregular HR/ Afib? Are you having the symptoms now?  Patient states yesterday, 09/13, during PCP check up she was told she was in afib. States she was not symptomatic, no palpitations. Unsure whether or not she is still in afib  Are you currently experiencing lightheadedness, SOB or CP?  Patient reports, "..had experienced occasional dizziness with pressure pain in the chest but no palpitations" Unsure whether or not she is currently experiencing these symptoms   Do you have a history of afib (atrial fibrillation) or irregular heart rhythm?  Yes, see above   Have you checked your BP or HR? (document readings if available):  No reading available  Are you experiencing any other symptoms?  Occasional dizziness, CP

## 2021-02-03 NOTE — Telephone Encounter (Signed)
PCP found patient to be in Afib at yesterdays (02/02/21) visit. No symptoms at this time stating, "I felt fine yesterday and today. I would have never guessed I was in Afib, it has been many years since I had that." Moved up coming appointment to 02/08/21 with Oda Kilts PA. Will have him review EKG and plan to see if anything needs to be added.  Jonni Sanger reviewed EKG- Inconclusive reading, follow up as planned on Monday with no medication changes at this time. ED precautions given.  Verbalized understanding.

## 2021-02-06 NOTE — Progress Notes (Addendum)
PCP:  Rochel Brome, MD Primary Cardiologist: None Electrophysiologist: [name], MD   Monique Kelly is a 65 y.o. female seen today for [name], MD for acute visit due to abnormal EKG .  Since last being seen in our clinic the patient reports doing very well overall. She has occasional palpitations that last 1-2 minutes. She has occasional chest pressure, but this is 90% of the time when lying down in bed at night. NO exertional component. Had a fall recently after lightheadedness. She does not tolerate CPAP mask. She is awaiting dental appliance.   Past Medical History:  Diagnosis Date   Atrial fibrillation [facility]) 2016   Sees Dr. [name]. Cardiac ablation, loop recorder (removed in 2022)   Atrial fibrillation with rapid ventricular response ([place]) 04/2015   Breast cancer ([place])    right lumpectomy   BRONCHITIS 03/10/2009   Qualifier: Diagnosis of  By: Geoffry Paradise MD, Kristine Royal    Chest pain with moderate risk for cardiac etiology 04/28/2015   Dehydration 06/18/2009   Qualifier: Diagnosis of  By: Pete Pelt     Depression    Family history of breast cancer    Family history of colon cancer    Fibromyalgia 2010   Hyperlipidemia    Hypothyroidism (acquired) 2006   s/p thyroidectomy for nodules   Morbid obesity ([place]) 08/10/2015   Obesity    gastric bpg 2011, weight then 400 lbs, lost 100 lbs in 60 days.   Obstructive sleep apnea    does not use cpap, does not have a following physician   Paroxysmal atrial fibrillation ([place]) 08/10/2015   Past Surgical History:  Procedure Laterality Date   ABDOMINAL HYSTERECTOMY  1992   total.    APPENDECTOMY     BREAST SURGERY  2019   right lumpectomy    CHOLECYSTECTOMY     COLONOSCOPY     ELECTROPHYSIOLOGIC STUDY N/A 09/08/2015   Procedure: Atrial Fibrillation Ablation;  Surgeon: [name], MD;  Location: [place] [facility];  Service: Cardiovascular;  Laterality: N/A;   EP IMPLANTABLE DEVICE N/A 12/17/2015   Procedure: Loop  Recorder Insertion;  Surgeon: [name], MD;  Location: [place] [facility];  Service: Cardiovascular;  Laterality: N/A;   ESOPHAGOGASTRODUODENOSCOPY     EXPLORATORY LAPAROTOMY WITH ABDOMINAL MASS EXCISION  2018   GASTRIC BYPASS  2011   lost 100 lbs, but gained some back.   implantable loop recorder removal  03/25/2020   removed 2022   THYROIDECTOMY  2006   for nodules ( hot and cold nodules) Goiter compressing trachea.    Current Outpatient Medications  Medication Sig Dispense Refill   buPROPion (WELLBUTRIN XL) 150 MG 24 hr tablet Take 450 mg by mouth daily.     busPIRone (BUSPAR) 5 MG tablet Take 1 tablet (5 mg total) by mouth 2 (two) times daily. 60 tablet 0   calcium carbonate (OS-CAL - DOSED IN MG OF ELEMENTAL CALCIUM) 1250 (500 Ca) MG tablet Take 1 tablet by mouth daily with breakfast.     cyanocobalamin 1000 MCG tablet Take by mouth.     ergocalciferol (VITAMIN D2) 1.25 MG (50000 UT) capsule Take by mouth.     gabapentin (NEURONTIN) 300 MG capsule 3 in am and 1 at night. 360 capsule 1   letrozole (FEMARA) 2.5 MG tablet Take by mouth.     levalbuterol (XOPENEX HFA) 45 MCG/ACT inhaler Inhale 2 puffs into the lungs every 8 (eight) hours as needed.     levothyroxine (  SYNTHROID) 75 MCG tablet Take 75 mcg by mouth daily.      lidocaine (LIDODERM) 5 % Place 1 patch onto the skin every 12 (twelve) hours as needed. Remove & Discard patch within 12 hours or as directed by MD 30 patch 2   omeprazole (PRILOSEC) 40 MG capsule Take 1 capsule (40 mg total) by mouth in the morning and at bedtime. 180 capsule 3   oxyCODONE (OXY IR/ROXICODONE) 5 MG immediate release tablet Take 5 mg by mouth every 4 (four) hours as needed.     Zoledronic Acid (RECLAST IV) Inject into the vein.     No current facility-administered medications for this visit.    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 Works as Technical brewer for [employer].   Right handed   Social Determinants of Health   Financial Resource Strain: Not on file  Food Insecurity: Not on file  Transportation Needs: Not on file  Physical Activity: Not on file  Stress: Not on file  Social Connections: Not on file  Intimate Partner Violence: Not on file     Review of Systems: All other systems reviewed and are otherwise negative except as noted above.  Physical Exam: There were no vitals filed for this visit.  GEN- The patient is well appearing, alert and oriented x 3 today.   HEENT: normocephalic, atraumatic; sclera clear, conjunctiva pink; hearing intact; oropharynx clear; neck supple, no JVP Lymph- no cervical lymphadenopathy Lungs- Clear to ausculation bilaterally, normal work of breathing.  No wheezes, rales, rhonchi Heart- Regular rate and rhythm, no murmurs, rubs or gallops, PMI not laterally displaced GI- soft, non-tender, non-distended, bowel sounds present, no hepatosplenomegaly Extremities- no clubbing, cyanosis, or edema; DP/PT/radial pulses 2+ bilaterally MS- no significant deformity or atrophy Skin- warm and dry, no rash or lesion Psych- euthymic mood, full affect Neuro- strength and sensation are  intact  EKG is ordered. Personal review of EKG from today shows sinus arrhyhtmia at 92 bpm  EKG 02/02/2021 appears to show sinus arrhyhtmia with clear sinus beats at times. Suspect was a voltage issue on EKG for beats where P waves were not obvious.   Additional studies reviewed include: Previous EP office notes.   Assessment and Plan:  1. Paroxysmal atrial fibrillation Overall has been well controlled s/p ablation.  Her ILR reached RRT and was removed CHA2DS2/VASc is 1 for female. She is not on [place] per guidelines.  If she feels she has having more, can place monitor. She also tolerated flecainide in the past.     2. Overweight Body mass index is 40.41 kg/m.   3. OSA Not  using CPAP currently. She is awaiting dental appliance.   4. HLD Most recent LDL > 150. She states she has failed statins in the past and is interested in seeing Lipid Clinic.   Shirley Friar, PA-C  02/06/21 1:05 PM

## 2021-02-08 ENCOUNTER — Encounter: Payer: Self-pay | Admitting: Family Medicine

## 2021-02-08 ENCOUNTER — Ambulatory Visit (INDEPENDENT_AMBULATORY_CARE_PROVIDER_SITE_OTHER): Payer: 59 | Admitting: Student

## 2021-02-08 ENCOUNTER — Encounter: Payer: Self-pay | Admitting: Student

## 2021-02-08 ENCOUNTER — Other Ambulatory Visit: Payer: Self-pay

## 2021-02-08 VITALS — BP 116/80 | HR 92 | Ht 67.0 in | Wt 258.0 lb

## 2021-02-08 DIAGNOSIS — I480 Paroxysmal atrial fibrillation: Secondary | ICD-10-CM | POA: Diagnosis not present

## 2021-02-08 DIAGNOSIS — G4733 Obstructive sleep apnea (adult) (pediatric): Secondary | ICD-10-CM | POA: Diagnosis not present

## 2021-02-08 DIAGNOSIS — E782 Mixed hyperlipidemia: Secondary | ICD-10-CM

## 2021-02-08 NOTE — Assessment & Plan Note (Signed)
The current medical regimen is effective;  continue present plan and medications.  

## 2021-02-08 NOTE — Patient Instructions (Signed)

## 2021-02-08 NOTE — Assessment & Plan Note (Signed)
Stop ibuprofen.  Stop alleve.  May take tylenol for back pain as directions on bottle.  Increase omeprazole to 40 mg one twice a day.

## 2021-02-08 NOTE — Assessment & Plan Note (Signed)
Continue femara.

## 2021-02-08 NOTE — Addendum Note (Signed)
Addended by: Barrington Ellison A on: 02/08/2021 09:05 AM   Modules accepted: Orders

## 2021-02-08 NOTE — Assessment & Plan Note (Signed)
Ordering lidoderm patch.  No nsaids.  Start on tylenol.

## 2021-03-01 ENCOUNTER — Ambulatory Visit: Payer: 59 | Admitting: Family Medicine

## 2021-03-02 ENCOUNTER — Ambulatory Visit: Payer: 59

## 2021-03-04 ENCOUNTER — Ambulatory Visit: Payer: 59 | Admitting: Family Medicine

## 2021-03-19 ENCOUNTER — Ambulatory Visit (INDEPENDENT_AMBULATORY_CARE_PROVIDER_SITE_OTHER): Payer: 59 | Admitting: Family Medicine

## 2021-03-19 ENCOUNTER — Other Ambulatory Visit: Payer: Self-pay

## 2021-03-19 ENCOUNTER — Ambulatory Visit (INDEPENDENT_AMBULATORY_CARE_PROVIDER_SITE_OTHER): Payer: 59

## 2021-03-19 ENCOUNTER — Encounter: Payer: Self-pay | Admitting: Family Medicine

## 2021-03-19 VITALS — BP 114/62 | HR 100 | Temp 97.1°F | Resp 18 | Ht 67.0 in | Wt 267.0 lb

## 2021-03-19 DIAGNOSIS — M797 Fibromyalgia: Secondary | ICD-10-CM

## 2021-03-19 DIAGNOSIS — Z23 Encounter for immunization: Secondary | ICD-10-CM

## 2021-03-19 DIAGNOSIS — G4733 Obstructive sleep apnea (adult) (pediatric): Secondary | ICD-10-CM | POA: Diagnosis not present

## 2021-03-19 DIAGNOSIS — Z170 Estrogen receptor positive status [ER+]: Secondary | ICD-10-CM

## 2021-03-19 DIAGNOSIS — M5416 Radiculopathy, lumbar region: Secondary | ICD-10-CM

## 2021-03-19 DIAGNOSIS — C50411 Malignant neoplasm of upper-outer quadrant of right female breast: Secondary | ICD-10-CM

## 2021-03-19 MED ORDER — TRAMADOL HCL 50 MG PO TABS: 50.0000 mg | ORAL_TABLET | Freq: Two times a day (BID) | ORAL | 0 refills | Status: DC | PRN

## 2021-03-19 NOTE — Progress Notes (Signed)
Subjective:  Patient ID: Monique Kelly, female    DOB: 1956/04/01  Age: 65 y.o. MRN: [id number]  Chief Complaint  Patient presents with   Fibromyalgia   Sleep Apnea    HPI Chronic back pain: Began > 1 yr ago. Aleve helps, but it upsets her stomach. Lidoderm patches denied by insurance. Salon pas do not help enough. Tylenol does not help. Previous MRIs have been abnormal both in 2019 and 2021. Saw pain clinic and they performed ESI x 2 which did not help. Tramadol 50 mg helps, but is out.  She prefers no surgery.    Osteopenia on bone density. ON calcium with D.  Breast Cancer: has to take femara. She was concerned this might be worsening her back pain, but she has had chronic back issues for some time.  Hyperlipidemia: Saw cardiology and they are getting her on injectable (repatha/praluent.)  Fibromyalgia: Savella denied. Really worked previously. Feels weak every day.   Current Outpatient Medications on File Prior to Visit  Medication Sig Dispense Refill   albuterol (VENTOLIN HFA) 108 (90 Base) MCG/ACT inhaler Inhale into the lungs as needed for wheezing or shortness of breath.     buPROPion (WELLBUTRIN XL) 150 MG 24 hr tablet Take 450 mg by mouth daily.     calcium carbonate (OS-CAL - DOSED IN MG OF ELEMENTAL CALCIUM) 1250 (500 Ca) MG tablet Take 1 tablet by mouth daily with breakfast.     cyanocobalamin 1000 MCG tablet Take by mouth.     ergocalciferol (VITAMIN D2) 1.25 MG (50000 UT) capsule Take by mouth.     gabapentin (NEURONTIN) 300 MG capsule 3 in am and 1 at night. 360 capsule 1   letrozole (FEMARA) 2.5 MG tablet Take by mouth.     levothyroxine (SYNTHROID) 75 MCG tablet Take 75 mcg by mouth daily.      omeprazole (PRILOSEC) 40 MG capsule Take 1 capsule (40 mg total) by mouth in the morning and at bedtime. 180 capsule 3   Zoledronic Acid (RECLAST IV) Inject into the vein.     No current facility-administered medications on file prior to visit.   Past Medical History:   Diagnosis Date   Atrial fibrillation [facility]) 2016   Sees Dr. [name]. Cardiac ablation, loop recorder (removed in 2022)   Atrial fibrillation with rapid ventricular response ([place]) 04/2015   Breast cancer ([place])    right lumpectomy   BRONCHITIS 03/10/2009   Qualifier: Diagnosis of  By: Geoffry Paradise MD, Kristine Royal    Chest pain with moderate risk for cardiac etiology 04/28/2015   Dehydration 06/18/2009   Qualifier: Diagnosis of  By: Pete Pelt     Depression    Family history of breast cancer    Family history of colon cancer    Fibromyalgia 2010   Hyperlipidemia    Hypothyroidism (acquired) 2006   s/p thyroidectomy for nodules   Morbid obesity ([place]) 08/10/2015   Obesity    gastric bpg 2011, weight then 400 lbs, lost 100 lbs in 60 days.   Obstructive sleep apnea    does not use cpap, does not have a following physician   Paroxysmal atrial fibrillation ([place]) 08/10/2015   Past Surgical History:  Procedure Laterality Date   ABDOMINAL HYSTERECTOMY  1992   total.    APPENDECTOMY     BREAST SURGERY  2019   right lumpectomy    CHOLECYSTECTOMY     COLONOSCOPY     ELECTROPHYSIOLOGIC STUDY N/A 09/08/2015   Procedure:  Atrial Fibrillation Ablation;  Surgeon: [name], MD;  Location: [place] [facility];  Service: Cardiovascular;  Laterality: N/A;   EP IMPLANTABLE DEVICE N/A 12/17/2015   Procedure: Loop Recorder Insertion;  Surgeon: [name], MD;  Location: [place] [facility];  Service: Cardiovascular;  Laterality: N/A;   ESOPHAGOGASTRODUODENOSCOPY     EXPLORATORY LAPAROTOMY WITH ABDOMINAL MASS EXCISION  2018   GASTRIC BYPASS  2011   lost 100 lbs, but gained some back.   implantable loop recorder removal  03/25/2020   removed 2022   THYROIDECTOMY  2006   for nodules ( hot and cold nodules) Goiter compressing trachea.    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 Works as Technical brewer for [employer].   Right handed   Social Determinants of Health   Financial Resource Strain: Not on file  Food Insecurity: Not on file  Transportation Needs: Not on file  Physical Activity: Not on file  Stress: Not on file  Social Connections: Not on file    Review of Systems  Constitutional:  Positive for fatigue. Negative for chills and fever.  HENT:  Negative for congestion, rhinorrhea and sore throat.   Respiratory:  Positive for shortness of breath. Negative for cough.   Cardiovascular:  Negative for chest pain and palpitations.  Gastrointestinal:  Negative for abdominal pain, constipation, diarrhea, nausea and vomiting.  Genitourinary:  Negative for dysuria and urgency.  Musculoskeletal:  Positive for arthralgias (bilateral knee pain) and back pain. Negative for myalgias.  Neurological:  Positive for light-headedness. Negative for dizziness, weakness and headaches.       Balance issues   Psychiatric/Behavioral:  Positive for dysphoric mood. The patient is not nervous/anxious.    Fell 3 days a week. She drags her feet. Knees sore from fall.    Objective:  BP 114/62   Pulse 100   Temp (!) 97.1 F (36.2 C)   Resp 18   Ht 5\' 7"  (1.702 m)   Wt 267 lb (121.1 kg)   BMI  41.82 kg/m   BP/Weight 03/19/2021 02/08/2021 5/68/1275  Systolic BP 170 017 494  Diastolic BP 62 80 78  Wt. (Lbs) 267 258 257  BMI 41.82 40.41 41.48    Physical Exam Vitals reviewed.  Constitutional:      Appearance: She is obese.  Neck:     Vascular: No carotid bruit.  Cardiovascular:     Rate and Rhythm: Normal rate and regular rhythm.  Pulses: Normal pulses.     Heart sounds: Normal heart sounds.  Pulmonary:     Effort: Pulmonary effort is normal.     Breath sounds: Normal breath sounds.  Abdominal:     General: Bowel sounds are normal.     Palpations: Abdomen is soft.     Tenderness: There is no abdominal tenderness.  Musculoskeletal:     Comments: Lumbar back tenderness.  Neurological:     Mental Status: She is alert and oriented to person, place, and time.  Psychiatric:        Mood and Affect: Mood normal.        Behavior: Behavior normal.    Diabetic Foot Exam - Simple   No data filed      Lab Results  Component Value Date   WBC 6.7 02/02/2021   HGB 11.9 02/02/2021   HCT 36.3 02/02/2021   PLT 256 02/02/2021   GLUCOSE 98 02/02/2021   CHOL 221 (H) 02/02/2021   TRIG 108 02/02/2021   HDL 45 02/02/2021   LDLCALC 157 (H) 02/02/2021   ALT 7 02/02/2021   AST 13 02/02/2021   NA 144 02/02/2021   K 4.0 02/02/2021   CL 107 (H) 02/02/2021   CREATININE 0.89 02/02/2021   BUN 15 02/02/2021   CO2 25 02/02/2021   TSH 1.581 01/20/2021      Assessment & Plan:   Problem List Items Addressed This Visit       Respiratory   Obstructive sleep apnea syndrome    Hopefully, sleep apnea mouthpiece.        Nervous and Auditory   Lumbar radiculopathy    Start on tramadol 50 mg one twice a day.  Continue gabapentin 300 mg 3 in am and 2 at bedtime.         Other   Breast cancer of upper-outer quadrant of right female breast ([place]) (Chronic)    Continue femara.      Fibromyalgia - Primary   Relevant Medications   traMADol (ULTRAM) 50 MG tablet   .  Meds ordered this encounter  Medications   traMADol (ULTRAM) 50 MG tablet    Sig: Take 1 tablet (50 mg total) by mouth every 12 (twelve) hours as needed for severe pain.    Dispense:  60 tablet    Refill:  0    No orders of the defined types were placed in this encounter.    Follow-up: No follow-ups on file.  An After Visit Summary was printed and given to the patient.  Rochel Brome, MD [facility] [phone]

## 2021-03-19 NOTE — Assessment & Plan Note (Signed)
Hopefully, sleep apnea mouthpiece.

## 2021-03-19 NOTE — Assessment & Plan Note (Signed)
Start on tramadol 50 mg one twice a day.  Continue gabapentin 300 mg 3 in am and 2 at bedtime.

## 2021-03-20 NOTE — Assessment & Plan Note (Signed)
Continue femara.

## 2021-03-22 ENCOUNTER — Ambulatory Visit: Payer: 59 | Admitting: Physician Assistant

## 2021-03-25 ENCOUNTER — Ambulatory Visit (INDEPENDENT_AMBULATORY_CARE_PROVIDER_SITE_OTHER): Payer: 59 | Admitting: Pharmacist

## 2021-03-25 ENCOUNTER — Encounter: Payer: Self-pay | Admitting: Pharmacist

## 2021-03-25 ENCOUNTER — Other Ambulatory Visit: Payer: Self-pay

## 2021-03-25 DIAGNOSIS — E782 Mixed hyperlipidemia: Secondary | ICD-10-CM | POA: Diagnosis not present

## 2021-03-25 DIAGNOSIS — T466X5A Adverse effect of antihyperlipidemic and antiarteriosclerotic drugs, initial encounter: Secondary | ICD-10-CM | POA: Diagnosis not present

## 2021-03-25 DIAGNOSIS — M791 Myalgia, unspecified site: Secondary | ICD-10-CM | POA: Insufficient documentation

## 2021-03-25 NOTE — Progress Notes (Signed)
Patient ID: Monique Kelly                 DOB: Jul 15, 1955                    MRN: [id number]     HPI: Monique Kelly is a 65 y.o. female patient referred to lipid clinic by Oda Kilts. PMH is significant for A fib, OSA, HLD, and obesity.  Has a history of statin intolerance.  Patient presents today in good spirits.  Lives alone, eats most meals at home.  Does not exercise much due to fibromyalgia pain.  Has tried atorvastatin and rosuvastatin in the past.  Both caused severe bone pain in which she had trouble moving.  Eats 2 meals per day, typically english muffin for breakfast and a microwave meal for dinner ([employer] or [employer]).  Has a strong family history of CAD.  Father and both sets of grandparents deceased from myocardial infarctions.  Current Medications: n/a Intolerances: rosuvastatin, atorvastatin Risk Factors: family history, HLD LDL goal: <70  Labs: TC 221, HDL 45, Trigs 108, LDL 157 (02/02/21 - not on any therapy)  Past Medical History:  Diagnosis Date   Atrial fibrillation [facility]) 2016   Sees Dr. [name]. Cardiac ablation, loop recorder (removed in 2022)   Atrial fibrillation with rapid ventricular response ([place]) 04/2015   Breast cancer ([place])    right lumpectomy   BRONCHITIS 03/10/2009   Qualifier: Diagnosis of  By: Geoffry Paradise MD, Kristine Royal    Chest pain with moderate risk for cardiac etiology 04/28/2015   Dehydration 06/18/2009   Qualifier: Diagnosis of  By: Pete Pelt     Depression    Family history of breast cancer    Family history of colon cancer    Fibromyalgia 2010   Hyperlipidemia    Hypothyroidism (acquired) 2006   s/p thyroidectomy for nodules   Morbid obesity ([place]) 08/10/2015   Obesity    gastric bpg 2011, weight then 400 lbs, lost 100 lbs in 60 days.   Obstructive sleep apnea    does not use cpap, does not have a following physician   Paroxysmal atrial fibrillation ([place]) 08/10/2015    Current Outpatient Medications on File  Prior to Visit  Medication Sig Dispense Refill   albuterol (VENTOLIN HFA) 108 (90 Base) MCG/ACT inhaler Inhale into the lungs as needed for wheezing or shortness of breath.     buPROPion (WELLBUTRIN XL) 150 MG 24 hr tablet Take 450 mg by mouth daily.     calcium carbonate (OS-CAL - DOSED IN MG OF ELEMENTAL CALCIUM) 1250 (500 Ca) MG tablet Take 1 tablet by mouth daily with breakfast.     cyanocobalamin 1000 MCG tablet Take by mouth.     ergocalciferol (VITAMIN D2) 1.25 MG (50000 UT) capsule Take by mouth.     gabapentin (NEURONTIN) 300 MG capsule 3 in am and 1 at night. 360 capsule 1   letrozole (FEMARA) 2.5 MG tablet Take by mouth.     levothyroxine (SYNTHROID) 75 MCG tablet Take 75 mcg by mouth daily.      omeprazole (PRILOSEC) 40 MG capsule Take 1 capsule (40 mg total) by mouth in the morning and at bedtime. 180 capsule 3   traMADol (ULTRAM) 50 MG tablet Take 1 tablet (50 mg total) by mouth every 12 (twelve) hours as needed for severe pain. 60 tablet 0   Zoledronic Acid (RECLAST IV) Inject into the vein.     No current  facility-administered medications on file prior to visit.    Allergies  Allergen Reactions   Nitroglycerin     Hypotension, difficulty maintaining conciousness Hypotension, difficulty maintaining conciousness Hypotension, difficulty maintaining conciousness    Assessment/Plan:  1. Hyperlipidemia - Patient current LDL 157 which is above goal of <70.  Unfortunately is intolerant to statins.   Discussed PCSK9i therapy with patient.  Using demo pen, educated patient on mechanism of action, storage, site selection, administration, and possible adverse effects.  Formulary prefers Repatha. Will complete PA.  Printed copay card for patient.  Recommended patient to try to eat a heart healthy diet.  Increase vegetables, lean proteins, and whole grains.  Recheck lipid panel in 2-3 months  Karren Cobble, PharmD, [place], [place], [place], [place], [place],  [address] Phone: [phone], Fax: [phone]

## 2021-03-25 NOTE — Patient Instructions (Signed)
It was nice meeting you today!  We would like your LDL (bad cholesterol) to be less than 70  We will start a new medication called Repatha which you will inject once every 2 weeks  I will complete the prior authorization for you and call you when it is approved  Once you start the medication we will recheck your cholesterol in about 2-3 months  Try to work on your diet.  Focus on vegetables, lean proteins, and whole grains  Please call with any questions!  Karren Cobble, PharmD, BCACP, [place], [place], [place], [place], [address] Phone: [phone], Fax: [phone]

## 2021-03-29 ENCOUNTER — Telehealth: Payer: Self-pay | Admitting: Pharmacist

## 2021-03-29 DIAGNOSIS — E782 Mixed hyperlipidemia: Secondary | ICD-10-CM

## 2021-03-29 MED ORDER — REPATHA SURECLICK 140 MG/ML ~~LOC~~ SOAJ: 1.0000 "pen " | SUBCUTANEOUS | 11 refills | Status: DC

## 2021-03-29 NOTE — Telephone Encounter (Signed)
Prior authorization approved for Repatha through 03/27/2022. Rx sent to [facility]. Patient previously given copay card Called pt and made her aware. She will get repeat labs in [place]. Labs ordered. Patient requested that we call her closer to the time as a reminder.

## 2021-04-13 ENCOUNTER — Other Ambulatory Visit: Payer: Self-pay | Admitting: Family Medicine

## 2021-05-08 ENCOUNTER — Encounter: Payer: Self-pay | Admitting: Internal Medicine

## 2021-05-10 NOTE — Telephone Encounter (Signed)
Per Allred to get in for visit with APP. Set up appt with Tommye Standard. Patient verbalized agreement and understanding with read back for appt date and time.

## 2021-05-17 NOTE — Progress Notes (Signed)
Cardiology Office Note Date:  05/17/2021  Patient ID:  Monique Kelly, Monique Kelly April 11, 1956, MRN [id number] PCP:  Rochel Brome, MD  Cardiologist/Electrophysiologist: Dr. Rayann Heman    Chief Complaint:   palpitations   History of Present Illness: Monique Kelly is a 65 y.o. female with history of hypothyroidism (s/p thyroidectomy 2/2 nodules), HLD, OSA (uses dental appliance), obesity w/hx of gastric bypass, breast cancer, and AFib, fibromyalgia  She has had a couple years of waxing/waning though fairly chronic c/o SOB, CP  I saw her 07/31/19 Unfortunately she still remains very fatigued, no energy.  Ever since her COVID infection she has never felt well again (she tested + on 06/24/2019) She remains extremely tired, fatigued all day, and with minimal exertion like showering, making the bed she gets very SOB and has to sit. She has not had the same CP/symptoms as she was having before COVID, but when she gets more SOB feels like her chest is heavy She was concerned about the finding of coronary calcifications on her CT and wanted to come in.  She states the day she went to the ER at [facility], she was woken feeling very SOB, felt like her heart was fast and felt clammy, she tried to relax, and eventually felt better.  She that morning got up, ready for work, and on the drive in again became SOB, would not settle dowm had heaviness in her chest and went to the ER. She says the labs, EKG all done with active symptoms. She does think that the new inhaler is more helpful then the one Rx at [facility]. No dizzy spells, no reports of near syncope or syncope. Unfortunately her job despite the note from the ER MD will not let her work from home. She is having trouble getting in to see her PMD. She mentions again today that she does tend to worry and let her mind get the better of her at times Recommended f/u with her PMD for work issues/notes Urged to revisit dental appliance when she could for her  apnea CP not suspect to be coronary, planned to update her echo.  Saw ENT 03/2020 for her sleep apnea, looks like plan was to revisit PAP therapy  She saw D. Allred as well 11/201, ILR was EOS and removed  MOST recently for EP saw A. Tillery, PA-C 02/08/21 2/2 to an abnormal EKG, she reported some occasional palpitations, Ekg that prompted her visit he suspected artifact with low voltage on the machine rather then true missing P waves Planned to have her wear a monitor if increased palpitations Referred to the lipid clinic for HLD, having failed statins historically  Patient via my chart messaged increased burden of palpitations and given an appt to come in  TODAY She is feeling well today Reports that about 2 weeks ago for about 5 days she felt fairly terrible with palpitations that felt disctinctly different then her AFib used to, these associated with dizziness with the feeling of being off balance, SOB and had R ear pain> her jaw and some chest dicomfort These always occurred together, more typically in the evenings and could last 10 minutes to 5-6 hours in duration. Then they were just gone for a couple days, then back again for a day or so and now has been resolved. No near syncope or syncope   Device information: MDT ILR, implanted 12/17/15 >> EOS >> explanted Nov 2021  AF HX PVI ablation, Dr. Rayann Heman 09/08/2015 Flecainide stopped Aug 2017  Past  Medical History:  Diagnosis Date   Atrial fibrillation [facility]) 2016   Sees Dr. [name]. Cardiac ablation, loop recorder (removed in 2022)   Atrial fibrillation with rapid ventricular response ([place]) 04/2015   Breast cancer ([place])    right lumpectomy   BRONCHITIS 03/10/2009   Qualifier: Diagnosis of  By: Geoffry Paradise MD, Kristine Royal    Chest pain with moderate risk for cardiac etiology 04/28/2015   Dehydration 06/18/2009   Qualifier: Diagnosis of  By: Pete Pelt     Depression    Family history of breast cancer    Family history of  colon cancer    Fibromyalgia 2010   Hyperlipidemia    Hypothyroidism (acquired) 2006   s/p thyroidectomy for nodules   Morbid obesity ([place]) 08/10/2015   Obesity    gastric bpg 2011, weight then 400 lbs, lost 100 lbs in 60 days.   Obstructive sleep apnea    does not use cpap, does not have a following physician   Paroxysmal atrial fibrillation ([place]) 08/10/2015    Past Surgical History:  Procedure Laterality Date   ABDOMINAL HYSTERECTOMY  1992   total.    APPENDECTOMY     BREAST SURGERY  2019   right lumpectomy    CHOLECYSTECTOMY     COLONOSCOPY     ELECTROPHYSIOLOGIC STUDY N/A 09/08/2015   Procedure: Atrial Fibrillation Ablation;  Surgeon: [name], MD;  Location: [facility];  Service: Cardiovascular;  Laterality: N/A;   EP IMPLANTABLE DEVICE N/A 12/17/2015   Procedure: Loop Recorder Insertion;  Surgeon: [name], MD;  Location: [facility];  Service: Cardiovascular;  Laterality: N/A;   ESOPHAGOGASTRODUODENOSCOPY     EXPLORATORY LAPAROTOMY WITH ABDOMINAL MASS EXCISION  2018   GASTRIC BYPASS  2011   lost 100 lbs, but gained some back.   implantable loop recorder removal  03/25/2020   removed 2022   THYROIDECTOMY  2006   for nodules ( hot and cold nodules) Goiter compressing trachea.    Current Outpatient Medications  Medication Sig Dispense Refill   albuterol (VENTOLIN HFA) 108 (90 Base) MCG/ACT inhaler Inhale into the lungs as needed for wheezing or shortness of breath.     buPROPion (WELLBUTRIN XL) 150 MG 24 hr tablet Take 450 mg by mouth daily.     calcium carbonate (OS-CAL - DOSED IN MG OF ELEMENTAL CALCIUM) 1250 (500 Ca) MG tablet Take 1 tablet by mouth daily with breakfast.     cyanocobalamin 1000 MCG tablet Take by mouth.     ergocalciferol (VITAMIN D2) 1.25 MG (50000 UT) capsule Take by mouth.     Evolocumab (REPATHA SURECLICK) 638 MG/ML SOAJ Inject 1 pen into the skin every 14 (fourteen) days. 2 mL 11   gabapentin (NEURONTIN) 300 MG capsule 3 in  am and 1 at night. 360 capsule 1   letrozole (FEMARA) 2.5 MG tablet Take by mouth.     levothyroxine (SYNTHROID) 75 MCG tablet Take 75 mcg by mouth daily.      omeprazole (PRILOSEC) 40 MG capsule Take 1 capsule (40 mg total) by mouth in the morning and at bedtime. 180 capsule 3   Zoledronic Acid (RECLAST IV) Inject into the vein.     No current facility-administered medications for this visit.    Allergies:   Nitroglycerin   Social History:  The patient  reports that she has never smoked. She has never used smokeless tobacco. She reports that she does not drink alcohol and does not use drugs.  Family History:  The patient's family history includes Cancer in her father and mother; Heart failure in her father, maternal grandfather, and maternal grandmother.  ROS:  Please see the history of present illness.  All other systems are reviewed and otherwise negative.   PHYSICAL EXAM:  VS:  There were no vitals taken for this visit. BMI: There is no height or weight on file to calculate BMI. Well nourished, well developed, in no acute distress  HEENT: normocephalic, atraumatic  Neck: no JVD, carotid bruits or masses Cardiac:  RRR; no significant murmurs, no rubs, or gallops Lungs:   CTA b/l, no wheezing, rhonchi or rales  Abd: soft, nontender MS: no deformity or atrophy Ext: no edema  Skin: warm and dry, no rash Neuro:  No gross deficits appreciated Psych: euthymic mood, full affect    EKG:  done today and reviewed by myself: SR, 90bpm, PACs, unchanged   08/16/2019: TTE IMPRESSIONS   1. Left ventricular ejection fraction, by estimation, is 60 to 65%. The  left ventricle has normal function. The left ventricle has no regional  wall motion abnormalities. Left ventricular diastolic parameters are  consistent with Grade I diastolic  dysfunction (impaired relaxation).   2. Right ventricular systolic function is normal. The right ventricular  size is normal. There is normal pulmonary  artery systolic pressure.   3. The mitral valve is normal in structure. No evidence of mitral valve  regurgitation. No evidence of mitral stenosis.   4. The aortic valve is tricuspid. Aortic valve regurgitation is not  visualized. Mild aortic valve sclerosis is present, with no evidence of  aortic valve stenosis.   5. The inferior vena cava is normal in size with greater than 50%  respiratory variability, suggesting right atrial pressure of 3 mmHg.   04/29/2019: stress myoview Nuclear stress EF: 54%. There was no ST segment deviation noted during stress. The study is normal. This is a low risk study. The left ventricular ejection fraction is normal (55-65%).   Normal pharmacologic nuclear stress test with no evidence for prior infarct or ischemia.  LVEF 54%.   12/26/2018: TTE IMPRESSIONS  1. The left ventricle has normal systolic function, with an ejection fraction of 55-60%. The cavity size was normal. Diastolic dysfunction, grade indeterminate. Indeterminate filling pressures.  2. The right ventricle has normal systolic function. The cavity was normal. There is no increase in right ventricular wall thickness.  3. The aortic valve is tricuspid. Mild aortic annular calcification noted.  4. The mitral valve is grossly normal. Mild thickening of the mitral valve leaflet.  5. The tricuspid valve is grossly normal.  6. The aorta is normal in size and structure.  7. The inferior vena cava was dilated in size with >50% respiratory variability.   06/26/17: TTE Study Conclusions - Left ventricle: The cavity size was normal. Systolic function was   normal. The estimated ejection fraction was in the range of 60%   to 65%. Wall motion was normal; there were no regional wall   motion abnormalities. - Left atrium: The atrium was mildly dilated. - Pulmonary arteries: Systolic pressure was mildly increased. PA   peak pressure: 32 mm Hg (S).   Impressions: - Several findings suggest possible  elevation in mean left atrial   pressure, but this cannot be established with confidence.   09/08/15: EPS/Ablation CONCLUSIONS: 1. Sinus rhythm upon presentation.   2. Intracardiac echo reveals a moderate sized left atrium with a common ostium to the left superior and inferior pulmonary  veins.  A PFO was also noted 3. Successful electrical isolation and anatomical encircling of all four pulmonary veins with radiofrequency current. 4. No inducible arrhythmias following ablation both on and off of dobutamine  5. No early apparent complications.  05/22/15: Lexiscan stress test The left ventricular ejection fraction is normal (55-65%). Nuclear stress EF: 60%. The study is normal. This is a low risk study.   Breast Attenuation no ischemia / infarction EF 60% ECG tachycardic with periods of PAF during study   Recent Labs: 11/25/2020: Magnesium 1.9 01/20/2021: TSH 1.581 02/02/2021: ALT 7; BUN 15; Creatinine, Ser 0.89; Hemoglobin 11.9; Platelets 256; Potassium 4.0; Sodium 144  02/02/2021: Chol/HDL Ratio 4.9; Cholesterol, Total 221; HDL 45; LDL Chol Calc (NIH) 157; Triglycerides 108   CrCl cannot be calculated (Patient's most recent lab result is older than the maximum 21 days allowed.).   Wt Readings from Last 3 Encounters:  03/19/21 267 lb (121.1 kg)  02/08/21 258 lb (117 kg)  02/02/21 257 lb (116.6 kg)     Other studies reviewed: Additional studies/records reviewed today include: summarized above  ASSESSMENT AND PLAN:   1. Afib     none since her ablation in 2017     CHA2DS2Vasc remains one for female, not on a/c  New palpitations 30 day event monitor  2. OSA  She did get the dental appliance and sleeps quite well      3. Jaw pain, chest discomfort     Negative stress test Dec 2020     Will pursue coronary CT though suspect non-cardiac etiology   Disposition: follow up in a couple months, sooner if needed  Current medicines are reviewed at length with the patient today.   The patient did not have any concerns regarding medicines.  Venetia Night, PA-C 05/17/2021 8:48 AM     [address] [place] [address] [phone] (office)  [phone] (fax)

## 2021-05-18 ENCOUNTER — Encounter: Payer: Self-pay | Admitting: *Deleted

## 2021-05-18 ENCOUNTER — Encounter: Payer: Self-pay | Admitting: Physician Assistant

## 2021-05-18 ENCOUNTER — Ambulatory Visit (INDEPENDENT_AMBULATORY_CARE_PROVIDER_SITE_OTHER): Payer: 59 | Admitting: Physician Assistant

## 2021-05-18 ENCOUNTER — Other Ambulatory Visit: Payer: Self-pay

## 2021-05-18 VITALS — BP 118/72 | HR 90 | Ht 67.0 in | Wt 267.0 lb

## 2021-05-18 DIAGNOSIS — R079 Chest pain, unspecified: Secondary | ICD-10-CM

## 2021-05-18 DIAGNOSIS — R002 Palpitations: Secondary | ICD-10-CM | POA: Diagnosis not present

## 2021-05-18 DIAGNOSIS — I480 Paroxysmal atrial fibrillation: Secondary | ICD-10-CM

## 2021-05-18 DIAGNOSIS — R42 Dizziness and giddiness: Secondary | ICD-10-CM | POA: Diagnosis not present

## 2021-05-18 MED ORDER — METOPROLOL TARTRATE 100 MG PO TABS: 100.0000 mg | ORAL_TABLET | Freq: Once | ORAL | 0 refills | Status: DC

## 2021-05-18 NOTE — Progress Notes (Signed)
Patient ID: Monique Kelly, female   DOB: [birth year], 65 y.o.   MRN: [id number] Patient enrolled for [employer] to ship a 30 day cardiac event monitor to her address on file. Provided [employer] patients insurance information for 2022 [facility] and 2023 [employer].

## 2021-05-18 NOTE — Patient Instructions (Addendum)
Medication Instructions:   Your physician recommends that you continue on your current medications as directed. Please refer to the Current Medication list given to you today.   ( TAKE METOPROLOL 100 MG  ONE TABLET (ONCE ) TWO HOURS PRIOR TO SCHEDULED CT PROCEDURE)  *If you need a refill on your cardiac medications before your next appointment, please call your pharmacy*   Lab Work:  BMET TODAY    If you have labs (blood work) drawn today and your tests are completely normal, you will receive your results only by: Martha (if you have MyChart) OR A paper copy in the mail If you have any lab test that is abnormal or we need to change your treatment, we will call you to review the results.   Testing/Procedures: Non-Cardiac CT Angiography (CTA), is a special type of CT scan that uses a computer to produce multi-dimensional views of major blood vessels throughout the body. In CT angiography, a contrast material is injected through an IV to help visualize the blood vessels  Your physician has recommended that you wear an event monitor. Event monitors are medical devices that record the hearts electrical activity. Doctors most often [place] these monitors to diagnose arrhythmias. Arrhythmias are problems with the speed or rhythm of the heartbeat. The monitor is a small, portable device. You can wear one while you do your normal daily activities. This is usually used to diagnose what is causing palpitations/syncope (passing out).     Follow-Up: At [facility], you and your health needs are our priority.  As part of our continuing mission to provide you with exceptional heart care, we have created designated Provider Care Teams.  These Care Teams include your primary Cardiologist (physician) and Advanced Practice Providers (APPs -  Physician Assistants and Nurse Practitioners) who all work together to provide you with the care you need, when you need it.  We recommend signing up for the  patient portal called "MyChart".  Sign up information is provided on this After Visit Summary.  MyChart is used to connect with patients for Virtual Visits (Telemedicine).  Patients are able to view lab/test results, encounter notes, upcoming appointments, etc.  Non-urgent messages can be sent to your provider as well.   To learn more about what you can do with MyChart, go to [email or web address].    Your next appointment:   2 month(s)  The format for your next appointment:   In Person  Provider:   You will see one of the following Advanced Practice Providers on your designated Care Team:   Tommye Standard, [place] Legrand Como "Jonni Sanger" Chalmers Cater, [place]  {   Other Instructions    Your cardiac CT will be scheduled at one of the below locations:   [facility] [address] [place], [place] [address] [phone]  [facility] [address] [place], [place] [address] [phone]  If scheduled at [facility], please arrive at the [facility] main entrance (entrance A) of [facility] 30 minutes prior to test start time. You can use the FREE valet parking offered at the main entrance (encouraged to control the heart rate for the test) Proceed to the [facility] Radiology Department (first floor) to check-in and test prep.  If scheduled at [facility], please arrive 15 mins early for check-in and test prep.  Please follow these instructions carefully (unless otherwise directed):  On the Night Before the Test: Be sure to  Drink plenty of water. Do not consume any caffeinated/decaffeinated beverages or chocolate 12 hours prior to your test. Do not take any antihistamines 12 hours prior to your test. If the patient has contrast allergy  On the Day of the Test: Drink plenty of water until 1 hour prior to the test. Do not eat any food 4 hours prior to the test. You may take your regular  medications prior to the test.  Take metoprolol (Lopressor) two hours prior to test. HOLD Furosemide/Hydrochlorothiazide morning of the test. FEMALES- please wear underwire-free bra if available, avoid dresses & tight clothing      After the Test: Drink plenty of water. After receiving IV contrast, you may experience a mild flushed feeling. This is normal. On occasion, you may experience a mild rash up to 24 hours after the test. This is not dangerous. If this occurs, you can take Benadryl 25 mg and increase your fluid intake. If you experience trouble breathing, this can be serious. If it is severe call 911 IMMEDIATELY. If it is mild, please call our office. If you take any of these medications: Glipizide/Metformin, Avandament, Glucavance, please do not take 48 hours after completing test unless otherwise instructed.  Please allow 2-4 weeks for scheduling of routine cardiac CTs. Some insurance companies require a pre-authorization which may delay scheduling of this test.   For non-scheduling related questions, please contact the cardiac imaging nurse navigator should you have any questions/concerns: Marchia Bond, Cardiac Imaging Nurse Navigator Gordy Clement, Cardiac Imaging Nurse Navigator [facility] Direct Office Dial: [phone]   For scheduling needs, including cancellations and rescheduling, please call [place], [phone].   [employer] Cardiac Event Monitor Instructions   Your physician has requested you wear your cardiac event monitor for __30___ days, (1-30). [employer] may call or text to confirm a shipping address. The monitor will be sent to a land address via UPS. [employer] will not ship a monitor to a PO BOX. It typically takes 3-5 days to receive your monitor after it has been enrolled. [employer] will assist with USPS tracking if your package is delayed. The telephone number for [employer] is [phone]. Once you have received your  monitor, please review the enclosed instructions. Instruction tutorials can also be viewed under help and settings on the enclosed cell phone. Your monitor has already been registered assigning a specific monitor serial # to you.  Applying the monitor Remove cell phone from case and turn it on. The cell phone works as Dealer and needs to be within [employer] of you at all times. The cell phone will need to be charged on a daily basis. We recommend you plug the cell phone into the enclosed charger at your bedside table every night.  Monitor batteries: You will receive two monitor batteries labelled #1 and #2. These are your recorders. Plug battery #2 onto the second connection on the enclosed charger. Keep one battery on the charger at all times. This will keep the monitor battery deactivated. It will also keep it fully charged for when you need to switch your monitor batteries. A small light will be blinking on the battery emblem when it is charging. The light on the battery emblem will remain on when the battery is fully charged.  Open package of a Monitor strip. Insert battery #1 into black hood on strip and gently squeeze monitor battery onto connection as indicated in instruction booklet. Set aside while preparing skin.  Choose location for your strip, vertical  or horizontal, as indicated in the instruction booklet. Shave to remove all hair from location. There cannot be any lotions, oils, powders, or colognes on skin where monitor is to be applied. Wipe skin clean with enclosed Saline wipe. Dry skin completely.  Peel paper labeled #1 off the back of the Monitor strip exposing the adhesive. Place the monitor on the chest in the vertical or horizontal position shown in the instruction booklet. One arrow on the monitor strip must be pointing upward. Carefully remove paper labeled #2, attaching remainder of strip to your skin. Try not to create any folds or wrinkles in the strip as  you apply it.  Firmly press and release the circle in the center of the monitor battery. You will hear a small beep. This is turning the monitor battery on. The heart emblem on the monitor battery will light up every 5 seconds if the monitor battery in turned on and connected to the patient securely. Do not push and hold the circle down as this turns the monitor battery off. The cell phone will locate the monitor battery. A screen will appear on the cell phone checking the connection of your monitor strip. This may read poor connection initially but change to good connection within the next minute. Once your monitor accepts the connection you will hear a series of 3 beeps followed by a climbing crescendo of beeps. A screen will appear on the cell phone showing the two monitor strip placement options. Touch the picture that demonstrates where you applied the monitor strip.  Your monitor strip and battery are waterproof. You are able to shower, bathe, or swim with the monitor on. They just ask you do not submerge deeper than 3 feet underwater. We recommend removing the monitor if you are swimming in a lake, river, or ocean.  Your monitor battery will need to be switched to a fully charged monitor battery approximately once a week. The cell phone will alert you of an action which needs to be made.  On the cell phone, tap for details to reveal connection status, monitor battery status, and cell phone battery status. The green dots indicates your monitor is in good status. A red dot indicates there is something that needs your attention.  To record a symptom, click the circle on the monitor battery. In 30-60 seconds a list of symptoms will appear on the cell phone. Select your symptom and tap save. Your monitor will record a sustained or significant arrhythmia regardless of you clicking the button. Some patients do not feel the heart rhythm irregularities. [employer] will notify us of any serious  or critical events.  Refer to instruction booklet for instructions on switching batteries, changing strips, the Do not disturb or Pause features, or any additional questions.  Call [employer] at [phone], to confirm your monitor is transmitting and record your baseline. They will answer any questions you may have regarding the monitor instructions at that time.  Returning the monitor to [place] all equipment back into blue box. Peel off strip of paper to expose adhesive and close box securely. There is a prepaid UPS shipping label on this box. Drop in a UPS drop box, or at a UPS facility like [employer]. You may also contact [employer] to arrange UPS to pick up monitor package at your home.

## 2021-05-19 LAB — BASIC METABOLIC PANEL
BUN/Creatinine Ratio: 15 (ref 12–28)
BUN: 13 mg/dL (ref 8–27)
CO2: 26 mmol/L (ref 20–29)
Calcium: 8.9 mg/dL (ref 8.7–10.3)
Chloride: 107 mmol/L — ABNORMAL HIGH (ref 96–106)
Creatinine, Ser: 0.88 mg/dL (ref 0.57–1.00)
Glucose: 90 mg/dL (ref 70–99)
Potassium: 4.6 mmol/L (ref 3.5–5.2)
Sodium: 144 mmol/L (ref 134–144)
eGFR: 73 mL/min/{1.73_m2} (ref >59)

## 2021-05-19 LAB — SPECIMEN STATUS REPORT

## 2021-05-20 ENCOUNTER — Encounter: Payer: Self-pay | Admitting: Hematology and Oncology

## 2021-05-25 ENCOUNTER — Ambulatory Visit (INDEPENDENT_AMBULATORY_CARE_PROVIDER_SITE_OTHER): Payer: 59

## 2021-05-25 ENCOUNTER — Encounter: Payer: Self-pay | Admitting: Internal Medicine

## 2021-05-25 DIAGNOSIS — R002 Palpitations: Secondary | ICD-10-CM

## 2021-05-25 DIAGNOSIS — R42 Dizziness and giddiness: Secondary | ICD-10-CM

## 2021-05-26 ENCOUNTER — Encounter: Payer: Self-pay | Admitting: Hematology and Oncology

## 2021-05-27 ENCOUNTER — Encounter: Payer: Self-pay | Admitting: Hematology and Oncology

## 2021-05-28 ENCOUNTER — Ambulatory Visit (INDEPENDENT_AMBULATORY_CARE_PROVIDER_SITE_OTHER): Payer: 59 | Admitting: Nurse Practitioner

## 2021-05-28 ENCOUNTER — Encounter: Payer: Self-pay | Admitting: Nurse Practitioner

## 2021-05-28 ENCOUNTER — Other Ambulatory Visit: Payer: Self-pay

## 2021-05-28 ENCOUNTER — Encounter: Payer: Self-pay | Admitting: Hematology and Oncology

## 2021-05-28 VITALS — BP 118/80 | HR 82 | Temp 97.2°F | Ht 67.0 in | Wt 268.0 lb

## 2021-05-28 DIAGNOSIS — M5441 Lumbago with sciatica, right side: Secondary | ICD-10-CM | POA: Diagnosis not present

## 2021-05-28 DIAGNOSIS — G8929 Other chronic pain: Secondary | ICD-10-CM | POA: Diagnosis not present

## 2021-05-28 MED ORDER — KETOROLAC TROMETHAMINE 60 MG/2ML IM SOLN
60.0000 mg | Freq: Once | INTRAMUSCULAR | Status: AC
  Administered 2021-05-28: 60 mg via INTRAMUSCULAR

## 2021-05-28 MED ORDER — TRIAMCINOLONE ACETONIDE 40 MG/ML IJ SUSP
60.0000 mg | Freq: Once | INTRAMUSCULAR | Status: AC
  Administered 2021-05-28: 60 mg via INTRAMUSCULAR

## 2021-05-28 NOTE — Progress Notes (Signed)
Acute Office Visit  Subjective:    Patient ID: Monique Kelly, female    DOB: April 01, 1956, 66 y.o.   MRN: [id number]  Chief Complaint  Patient presents with   Back Pain    HPI: Patient is in today for Back Pain  She reports chronic back pain. There was not an injury that may have caused the pain. The most recent episode started a few days ago and is staying constant. The pain is Located in the lower back pain with radiation down right leg. It is described as stabbing, is 3/10 in intensity, occurring constantly. Symptoms are worse in the: nighttime  Aggravating factors: bending forwards and walking Relieving factors: sitting.  She has tried application of heat and application of ice with no relief. States she was seen at pain clinic approximately 1-year-ago, was previously treated with Tramadol for chronic pain. States she discontinued Tramadol due to lack of efficacy. Currently prescribed Neurontin for fibromyalgia.  Associated symptoms: No abdominal pain No bowel incontinence  No chest pain No dysuria   No fever No headaches  No joint pains No pelvic pain  No weakness in leg  No tingling in lower extremities  No urinary incontinence No weight loss     Past Medical History:  Diagnosis Date   Atrial fibrillation [facility]) 2016   Sees Dr. [name]. Cardiac ablation, loop recorder (removed in 2022)   Atrial fibrillation with rapid ventricular response ([place]) 04/2015   Breast cancer ([place])    right lumpectomy   BRONCHITIS 03/10/2009   Qualifier: Diagnosis of  By: Geoffry Paradise MD, Kristine Royal    Chest pain with moderate risk for cardiac etiology 04/28/2015   Dehydration 06/18/2009   Qualifier: Diagnosis of  By: Pete Pelt     Depression    Family history of breast cancer    Family history of colon cancer    Fibromyalgia 2010   Hyperlipidemia    Hypothyroidism (acquired) 2006   s/p thyroidectomy for nodules   Morbid obesity ([place]) 08/10/2015   Obesity    gastric bpg 2011,  weight then 400 lbs, lost 100 lbs in 60 days.   Obstructive sleep apnea    does not use cpap, does not have a following physician   Paroxysmal atrial fibrillation ([place]) 08/10/2015    Past Surgical History:  Procedure Laterality Date   ABDOMINAL HYSTERECTOMY  1992   total.    APPENDECTOMY     BREAST SURGERY  2019   right lumpectomy    CHOLECYSTECTOMY     COLONOSCOPY     ELECTROPHYSIOLOGIC STUDY N/A 09/08/2015   Procedure: Atrial Fibrillation Ablation;  Surgeon: [name], MD;  Location: [facility];  Service: Cardiovascular;  Laterality: N/A;   EP IMPLANTABLE DEVICE N/A 12/17/2015   Procedure: Loop Recorder Insertion;  Surgeon: [name], MD;  Location: [facility];  Service: Cardiovascular;  Laterality: N/A;   ESOPHAGOGASTRODUODENOSCOPY     EXPLORATORY LAPAROTOMY WITH ABDOMINAL MASS EXCISION  2018   GASTRIC BYPASS  2011   lost 100 lbs, but gained some back.   implantable loop recorder removal  03/25/2020   removed 2022   THYROIDECTOMY  2006   for nodules ( hot and cold nodules) Goiter compressing trachea.    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 Works as Pharmacist, community for [employer].   Right handed   Social Determinants of Health   Financial Resource Strain: Not on file  Food Insecurity: Not on file  Transportation Needs: Not on file  Physical Activity: Not on file  Stress: Not on file  Social Connections: Not on file  Intimate Partner Violence: Not on file    Outpatient Medications Prior to Visit  Medication Sig Dispense Refill   albuterol (VENTOLIN HFA) 108 (90 Base) MCG/ACT inhaler Inhale into the lungs as needed for wheezing or shortness of breath.     buPROPion (WELLBUTRIN XL) 150 MG 24 hr tablet Take 450 mg by mouth daily.     calcium carbonate (OS-CAL - DOSED IN MG OF ELEMENTAL CALCIUM) 1250 (500 Ca) MG tablet Take 1 tablet by mouth daily with breakfast.     cyanocobalamin 1000 MCG tablet Take by mouth.     ergocalciferol (VITAMIN D2) 1.25 MG (50000 UT) capsule Take by mouth.     Evolocumab (REPATHA SURECLICK) 704 MG/ML SOAJ Inject 1 pen into the skin every 14 (fourteen) days. 2 mL 11   gabapentin (NEURONTIN) 300 MG capsule 3 in am and 1 at night. 360 capsule 1   letrozole (FEMARA) 2.5 MG tablet Take by mouth.     levothyroxine (SYNTHROID) 75 MCG tablet Take 75 mcg by mouth daily.      metoprolol tartrate (LOPRESSOR) 100 MG tablet Take 1 tablet (100 mg total) by mouth once for 1 dose. Two hours prior to procedure 1 tablet 0   omeprazole (PRILOSEC) 40 MG capsule Take 1 capsule (40 mg total) by mouth in the morning and at bedtime. 180 capsule 3   Zoledronic Acid (RECLAST IV) Inject into the vein.     No facility-administered medications prior to visit.    Allergies  Allergen Reactions   Nitroglycerin     Hypotension, difficulty maintaining conciousness Hypotension, difficulty maintaining conciousness Hypotension, difficulty maintaining conciousness    Review of Systems  Constitutional:  Negative for chills, fatigue and fever.  HENT:  Negative for congestion, ear pain, rhinorrhea and sore throat.   Respiratory:   Negative for cough and shortness of breath.   Cardiovascular:  Negative for chest pain.  Gastrointestinal:  Negative for abdominal pain, constipation, diarrhea and nausea.  Genitourinary:  Negative for dysuria and urgency.  Musculoskeletal:  Positive for back pain (chronic).  Neurological:  Negative for dizziness, weakness, light-headedness, numbness and headaches.      Objective:    Physical Exam Vitals reviewed.  Constitutional:      Appearance: She is obese.  Musculoskeletal:        General: Tenderness present.  Lumbar back: Tenderness present. No spasms. Normal range of motion. Negative right straight leg raise test and negative left straight leg raise test.  Skin:    General: Skin is warm and dry.     Capillary Refill: Capillary refill takes less than 2 seconds.  Neurological:     General: No focal deficit present.     Mental Status: She is oriented to person, place, and time.  Psychiatric:        Mood and Affect: Mood normal.        Behavior: Behavior normal.    Pulse 82    Temp (!) 97.2 F (36.2 C)    Ht '5\' 7"'  (1.702 m)    Wt 268 lb (121.6 kg)    SpO2 98%    BMI 41.97 kg/m  BP 118/80    Pulse 82    Temp (!) 97.2 F (36.2 C)    Ht '5\' 7"'  (1.702 m)    Wt 268 lb (121.6 kg)    SpO2 98%    BMI 41.97 kg/m   Wt Readings from Last 3 Encounters:  05/28/21 268 lb (121.6 kg)  05/18/21 267 lb (121.1 kg)  03/19/21 267 lb (121.1 kg)    Health Maintenance Due  Topic Date Due   Pneumococcal Vaccine 30-58 Years old (1 - PCV) Never done   HIV Screening  Never done   Hepatitis C Screening  Never done   TETANUS/TDAP  Never done   Zoster Vaccines- Shingrix (1 of 2) Never done   PAP SMEAR-Modifier  Never done   COVID-19 Vaccine (3 - Booster for Janssen series) 05/14/2021     Lab Results  Component Value Date   TSH 1.581 01/20/2021   Lab Results  Component Value Date   WBC 6.7 02/02/2021   HGB 11.9 02/02/2021   HCT 36.3 02/02/2021   MCV 93 02/02/2021   PLT 256  02/02/2021   Lab Results  Component Value Date   NA 144 05/18/2021   K 4.6 05/18/2021   CO2 26 05/18/2021   GLUCOSE 90 05/18/2021   BUN 13 05/18/2021   CREATININE 0.88 05/18/2021   BILITOT 0.5 02/02/2021   ALKPHOS 113 02/02/2021   AST 13 02/02/2021   ALT 7 02/02/2021   PROT 6.1 02/02/2021   ALBUMIN 3.6 (L) 02/02/2021   CALCIUM 8.9 05/18/2021   ANIONGAP 11 07/25/2019   EGFR 73 05/18/2021   Lab Results  Component Value Date   CHOL 221 (H) 02/02/2021   Lab Results  Component Value Date   HDL 45 02/02/2021   Lab Results  Component Value Date   LDLCALC 157 (H) 02/02/2021   Lab Results  Component Value Date   TRIG 108 02/02/2021   Lab Results  Component Value Date   CHOLHDL 4.9 (H) 02/02/2021        Assessment & Plan:   1. Chronic right-sided low back pain with right-sided sciatica - triamcinolone acetonide (KENALOG-40) injection 60 mg - ketorolac (TORADOL) injection 60 mg    Kenalog 60 mg and Toradol 60 mg injections given in office  Alternate heat and ice to lower back Perform back exercises/stretches daily Follow-up with Dr Tobie Poet for chronic fasting appointment in around 4-weeks     Follow-up: 4-weeks for chronic fasting with Dr Tobie Poet  An After Visit Summary was printed and given to the patient.  I, Rip Harbour, NP, have reviewed all documentation for this visit. The documentation on 05/28/21 for the exam, diagnosis, procedures, and orders are all  accurate and complete.    Signed, Rip Harbour, NP [place] [phone]

## 2021-05-28 NOTE — Patient Instructions (Addendum)
Kenalog 60 mg and Toradol 60 mg injections given in office  Alternate heat and ice to lower back Perform back exercises/stretches daily Follow-up with Dr Tobie Poet for chronic fasting appointment in around 4-weeks  Chronic Back Pain When back pain lasts longer than 3 months, it is called chronic back pain. Pain may get worse at certain times (flare-ups). There are things you can do at home to manage your pain. Follow these instructions at home: Pay attention to any changes in your symptoms. Take these actions to help with your pain: Managing pain and stiffness   If told, put ice on the painful area. Your doctor may tell you to use ice for 24-48 hours after the flare-up starts. To do this: Put ice in a plastic bag. Place a towel between your skin and the bag. Leave the ice on for 20 minutes, 2-3 times a day. If told, put heat on the painful area. Do this as often as told by your doctor. Use the heat source that your doctor recommends, such as a moist heat pack or a heating pad. Place a towel between your skin and the heat source. Leave the heat on for 20-30 minutes. Take off the heat if your skin turns bright red. This is especially important if you are unable to feel pain, heat, or cold. You may have a greater risk of getting burned. Soak in a warm bath. This can help relieve pain. Activity  Avoid bending and other activities that make pain worse. When standing: Keep your upper back and neck straight. Keep your shoulders pulled back. Avoid slouching. When sitting: Keep your back straight. Relax your shoulders. Do not round your shoulders or pull them backward. Do not sit or stand in one place for long periods of time. Take short rest breaks during the day. Lying down or standing is usually better than sitting. Resting can help relieve pain. When sitting or lying down for a long time, do some mild activity or stretching. This will help to prevent stiffness and pain. Get regular exercise.  Ask your doctor what activities are safe for you. Do not lift anything that is heavier than 10 lb (4.5 kg) or the limit that you are told, until your doctor says that it is safe. To prevent injury when you lift things: Bend your knees. Keep the weight close to your body. Avoid twisting. Sleep on a firm mattress. Try lying on your side with your knees slightly bent. If you lie on your back, put a pillow under your knees. Medicines Treatment may include medicines for pain and swelling taken by mouth or put on the skin, prescription pain medicine, or muscle relaxants. Take over-the-counter and prescription medicines only as told by your doctor. Ask your doctor if the medicine prescribed to you: Requires you to avoid driving or using machinery. Can cause trouble pooping (constipation). You may need to take these actions to prevent or treat trouble pooping: Drink enough fluid to keep your pee (urine) pale yellow. Take over-the-counter or prescription medicines. Eat foods that are high in fiber. These include beans, whole grains, and fresh fruits and vegetables. Limit foods that are high in fat and sugars. These include fried or sweet foods. General instructions Do not use any products that contain nicotine or tobacco, such as cigarettes, e-cigarettes, and chewing tobacco. If you need help quitting, ask your doctor. Keep all follow-up visits as told by your doctor. This is important. Contact a doctor if: Your pain does not get better with  rest or medicine. Your pain gets worse, or you have new pain. You have a high fever. You lose weight very quickly. You have trouble doing your normal activities. Get help right away if: One or both of your legs or feet feel weak. One or both of your legs or feet lose feeling (have numbness). You have trouble controlling when you poop (have a bowel movement) or pee (urinate). You have bad back pain and: You feel like you may vomit (nauseous), or you  vomit. You have pain in your belly (abdomen). You have shortness of breath. You faint. Summary When back pain lasts longer than 3 months, it is called chronic back pain. Pain may get worse at certain times (flare-ups). Use ice and heat as told by your doctor. Your doctor may tell you to use ice after flare-ups. This information is not intended to replace advice given to you by your health care provider. Make sure you discuss any questions you have with your health care provider. Document Revised: 06/19/2019 Document Reviewed: 06/19/2019 Elsevier Patient Education  2022 [place]. Sciatica Rehab Ask your health care provider which exercises are safe for you. Do exercises exactly as told by your health care provider and adjust them as directed. It is normal to feel mild stretching, pulling, tightness, or discomfort as you do these exercises. Stop right away if you feel sudden pain or your pain gets worse. Do not begin these exercises until told by your health care provider. Stretching and range-of-motion exercises These exercises warm up your muscles and joints and improve the movement and flexibility of your hips and back. These exercises also help to relieve pain, numbness, and tingling. Sciatic nerve glide Sit in a chair with your head facing down toward your chest. Place your hands behind your back. Let your shoulders slump forward. Slowly straighten one of your legs while you tilt your head back as if you are looking toward the ceiling. Only straighten your leg as far as you can without making your symptoms worse. Hold this position for __________ seconds. Slowly return to the starting position. Repeat with your other leg. Repeat __________ times. Complete this exercise __________ times a day. Knee to chest with hip adduction and internal rotation  Lie on your back on a firm surface with both legs straight. Bend one of your knees and move it up toward your chest until you feel a  gentle stretch in your lower back and buttock. Then, move your knee toward the shoulder that is on the opposite side from your leg. This is hip adduction and internal rotation. Hold your leg in this position by holding on to the front of your knee. Hold this position for __________ seconds. Slowly return to the starting position. Repeat with your other leg. Repeat __________ times. Complete this exercise __________ times a day. Prone extension on elbows  Lie on your abdomen on a firm surface. A bed may be too soft for this exercise. Prop yourself up on your elbows. Use your arms to help lift your chest up until you feel a gentle stretch in your abdomen and your lower back. This will place some of your body weight on your elbows. If this is uncomfortable, try stacking pillows under your chest. Your hips should stay down, against the surface that you are lying on. Keep your hip and back muscles relaxed. Hold this position for __________ seconds. Slowly relax your upper body and return to the starting position. Repeat __________ times. Complete this exercise  __________ times a day. Strengthening exercises These exercises build strength and endurance in your back. Endurance is the ability to use your muscles for a long time, even after they get tired. Pelvic tilt This exercise strengthens the muscles that lie deep in the abdomen. Lie on your back on a firm surface. Bend your knees and keep your feet flat on the floor. Tense your abdominal muscles. Tip your pelvis up toward the ceiling and flatten your lower back into the floor. To help with this exercise, you may place a small towel under your lower back and try to push your back into the towel. Hold this position for __________ seconds. Let your muscles relax completely before you repeat this exercise. Repeat __________ times. Complete this exercise __________ times a day. Alternating arm and leg raises  Get on your hands and knees on a firm  surface. If you are on a hard floor, you may want to use padding, such as an exercise mat, to cushion your knees. Line up your arms and legs. Your hands should be directly below your shoulders, and your knees should be directly below your hips. Lift your left leg behind you. At the same time, raise your right arm and straighten it in front of you. Do not lift your leg higher than your hip. Do not lift your arm higher than your shoulder. Keep your abdominal and back muscles tight. Keep your hips facing the ground. Do not arch your back. Keep your balance carefully, and do not hold your breath. Hold this position for __________ seconds. Slowly return to the starting position. Repeat with your right leg and your left arm. Repeat __________ times. Complete this exercise __________ times a day. Posture and body mechanics Good posture and healthy body mechanics can help to relieve stress in your body's tissues and joints. Body mechanics refers to the movements and positions of your body while you do your daily activities. Posture is part of body mechanics. Good posture means: Your spine is in its natural S-curve position (neutral). Your shoulders are pulled back slightly. Your head is not tipped forward. Follow these guidelines to improve your posture and body mechanics in your everyday activities. Standing  When standing, keep your spine neutral and your feet about hip width apart. Keep a slight bend in your knees. Your ears, shoulders, and hips should line up. When you do a task in which you stand in one place for a long time, place one foot up on a stable object that is 2-4 inches (5-10 cm) high, such as a footstool. This helps keep your spine neutral. Sitting  When sitting, keep your spine neutral and keep your feet flat on the floor. Use a footrest, if necessary, and keep your thighs parallel to the floor. Avoid rounding your shoulders, and avoid tilting your head forward. When working at a  desk or a computer, keep your desk at a height where your hands are slightly lower than your elbows. Slide your chair under your desk so you are close enough to maintain good posture. When working at a computer, place your monitor at a height where you are looking straight ahead and you do not have to tilt your head forward or downward to look at the screen. Resting When lying down and resting, avoid positions that are most painful for you. If you have pain with activities such as sitting, bending, stooping, or squatting, lie in a position in which your body does not bend very much. For example,  avoid curling up on your side with your arms and knees near your chest (fetal position). If you have pain with activities such as standing for a long time or reaching with your arms, lie with your spine in a neutral position and bend your knees slightly. Try the following positions: Lying on your side with a pillow between your knees. Lying on your back with a pillow under your knees. Lifting  When lifting objects, keep your feet at least shoulder width apart and tighten your abdominal muscles. Bend your knees and hips and keep your spine neutral. It is important to lift using the strength of your legs, not your back. Do not lock your knees straight out. Always ask for help to lift heavy or awkward objects. This information is not intended to replace advice given to you by your health care provider. Make sure you discuss any questions you have with your health care provider. Document Revised: 08/31/2018 Document Reviewed: 05/31/2018 Elsevier Patient Education  [place].

## 2021-05-31 ENCOUNTER — Telehealth: Payer: Self-pay

## 2021-05-31 NOTE — Telephone Encounter (Signed)
-----   Message from Rollen Sox, [facility] sent at 05/31/2021  8:03 AM EST ----- Regarding: FW: lipids Please call patient to remind to have lipid panel drawn  ----- Message ----- From: Rollen Sox, [facility] Sent: 05/31/2021  12:00 AM EST To: Rollen Sox, RPH Subject: lipids

## 2021-05-31 NOTE — Telephone Encounter (Signed)
Lmom to schedule lipid labs

## 2021-06-01 ENCOUNTER — Telehealth (HOSPITAL_COMMUNITY): Payer: Self-pay | Admitting: *Deleted

## 2021-06-01 ENCOUNTER — Other Ambulatory Visit (HOSPITAL_COMMUNITY): Payer: Self-pay | Admitting: *Deleted

## 2021-06-01 ENCOUNTER — Other Ambulatory Visit: Payer: Self-pay

## 2021-06-01 DIAGNOSIS — R42 Dizziness and giddiness: Secondary | ICD-10-CM

## 2021-06-01 DIAGNOSIS — T466X5A Adverse effect of antihyperlipidemic and antiarteriosclerotic drugs, initial encounter: Secondary | ICD-10-CM

## 2021-06-01 DIAGNOSIS — R002 Palpitations: Secondary | ICD-10-CM

## 2021-06-01 DIAGNOSIS — M791 Myalgia, unspecified site: Secondary | ICD-10-CM

## 2021-06-01 DIAGNOSIS — E782 Mixed hyperlipidemia: Secondary | ICD-10-CM

## 2021-06-01 MED ORDER — IVABRADINE HCL 5 MG PO TABS: ORAL_TABLET | ORAL | 0 refills | Status: DC

## 2021-06-01 NOTE — Telephone Encounter (Signed)
Reaching out to patient to offer assistance regarding upcoming cardiac imaging study; pt verbalizes understanding of appt date/time, parking situation and where to check in, pre-test NPO status and medications ordered, and verified current allergies; name and call back number provided for further questions should they arise  Gordy Clement RN Navigator Cardiac Imaging [facility] [phone] office [phone] cell  Patient states that she had an incident at the ED where she was eventually dx with pneumonia but had chest pain.  Her BP dropped and she was out of it.  However, prior to that incident, she states that she had nitro without any issues.  I explained the importance of nitroglycerin for the test and she is agreeable to taking the nitroglycerin for the test.    She has a port but is willing to allow [place] to start an IV. She is to take 15mg  ivabradine two hours prior to cardiac CT scan and is aware to arrive at 12:30pm for her 1pm scan.

## 2021-06-02 ENCOUNTER — Ambulatory Visit (HOSPITAL_COMMUNITY)
Admission: RE | Admit: 2021-06-02 | Discharge: 2021-06-02 | Disposition: A | Discharge status: Home / Self Care | Payer: 59 | Source: Ambulatory Visit | Attending: Internal Medicine | Admitting: Internal Medicine

## 2021-06-02 ENCOUNTER — Other Ambulatory Visit: Payer: Self-pay

## 2021-06-02 ENCOUNTER — Encounter (HOSPITAL_COMMUNITY): Payer: Self-pay

## 2021-06-02 ENCOUNTER — Ambulatory Visit (HOSPITAL_COMMUNITY)
Admission: RE | Admit: 2021-06-02 | Discharge: 2021-06-02 | Disposition: A | Discharge status: Home / Self Care | Payer: 59 | Source: Ambulatory Visit | Attending: Physician Assistant | Admitting: Physician Assistant

## 2021-06-02 DIAGNOSIS — I2510 Atherosclerotic heart disease of native coronary artery without angina pectoris: Secondary | ICD-10-CM | POA: Diagnosis not present

## 2021-06-02 DIAGNOSIS — R931 Abnormal findings on diagnostic imaging of heart and coronary circulation: Secondary | ICD-10-CM | POA: Diagnosis not present

## 2021-06-02 DIAGNOSIS — R079 Chest pain, unspecified: Secondary | ICD-10-CM | POA: Diagnosis present

## 2021-06-02 MED ORDER — NITROGLYCERIN 0.4 MG SL SUBL
SUBLINGUAL_TABLET | SUBLINGUAL | Status: AC
  Administered 2021-06-02: 0.8 mg via SUBLINGUAL
  Filled 2021-06-02: qty 2

## 2021-06-02 MED ORDER — IOHEXOL 350 MG/ML SOLN
100.0000 mL | Freq: Once | INTRAVENOUS | Status: AC | PRN
  Administered 2021-06-02: 100 mL via INTRAVENOUS

## 2021-06-02 MED ORDER — NITROGLYCERIN 0.4 MG SL SUBL: 0.8000 mg | SUBLINGUAL_TABLET | Freq: Once | SUBLINGUAL | Status: AC

## 2021-06-03 ENCOUNTER — Encounter: Payer: Self-pay | Admitting: Family Medicine

## 2021-06-07 ENCOUNTER — Telehealth: Payer: Self-pay

## 2021-06-07 ENCOUNTER — Telehealth: Payer: Self-pay | Admitting: *Deleted

## 2021-06-07 ENCOUNTER — Other Ambulatory Visit: Payer: 59

## 2021-06-07 LAB — LIPID PANEL
Chol/HDL Ratio: 2.6 ratio (ref 0.0–4.4)
Cholesterol, Total: 138 mg/dL (ref 100–199)
HDL: 54 mg/dL (ref >39)
LDL Chol Calc (NIH): 67 mg/dL (ref 0–99)
Triglycerides: 93 mg/dL (ref 0–149)
VLDL Cholesterol Cal: 17 mg/dL (ref 5–40)

## 2021-06-07 LAB — HEPATIC FUNCTION PANEL
ALT: 11 IU/L (ref 0–32)
AST: 13 IU/L (ref 0–40)
Albumin: 3.8 g/dL (ref 3.8–4.8)
Alkaline Phosphatase: 148 IU/L — ABNORMAL HIGH (ref 44–121)
Bilirubin Total: 0.6 mg/dL (ref 0.0–1.2)
Bilirubin, Direct: 0.2 mg/dL (ref 0.00–0.40)
Total Protein: 6.7 g/dL (ref 6.0–8.5)

## 2021-06-07 NOTE — Telephone Encounter (Signed)
° °  Cardiac Monitor Alert  Date of alert:  06/07/2021   Patient Name: Monique Kelly  DOB: 1956/01/12  MRN: [id number]   [facility] Cardiologist: None  [facility] EP:  [name], MD    Monitor Information: Cardiac Event Monitor [[employer]]  Reason:  New palpitations Ordering provider:  Tommye Standard, PA   Alert Atrial Fibrillation/Flutter This is the 1st alert for this rhythm.  The patient has a hx of Atrial Fibrillation/Flutter.  The patient is not currently on anticoagulation.  Next Cardiology Appointment   Date:  07/26/2021  Provider:  Charlcie Cradle, PA  The patient was contacted today.  She is asymptomatic. Pt pushed button by mistake while changing battery.  Pt reports [employer] called her 65 yo mother at 2 am instead of her.  RN caledl [employer] to advise not to call pt mother.     Other: No follow up needed.   Precious Gilding, RN  06/07/2021 10:30 AM

## 2021-06-07 NOTE — Telephone Encounter (Signed)
Spoke with patient and patient aware of results and verbalized understanding.Patient has appointment 07-26-21 with [facility] and would  like to return then not sooner.

## 2021-06-07 NOTE — Telephone Encounter (Signed)
-----   Message from [place], [place] sent at 06/02/2021  5:44 PM EST ----- CT notes some plaque build up but no clear obstructive disease, pending a follow up measurement on he scan.  It is important that her cholesterol is being lowered, I see her PMD started her on crestor back in Sept.  When is her next lab getting done?  Would like to see her back in clinic in a month or so.

## 2021-06-07 NOTE — Telephone Encounter (Signed)
[employer] noted first episode of Atrial fibrillation.

## 2021-06-08 ENCOUNTER — Other Ambulatory Visit: Payer: Self-pay | Admitting: Oncology

## 2021-06-08 ENCOUNTER — Other Ambulatory Visit: Payer: Self-pay | Admitting: Family Medicine

## 2021-06-08 DIAGNOSIS — Z1231 Encounter for screening mammogram for malignant neoplasm of breast: Secondary | ICD-10-CM

## 2021-06-15 ENCOUNTER — Other Ambulatory Visit: Payer: Self-pay | Admitting: Family Medicine

## 2021-06-18 ENCOUNTER — Encounter: Payer: Self-pay | Admitting: Family Medicine

## 2021-06-28 ENCOUNTER — Ambulatory Visit
Admission: RE | Admit: 2021-06-28 | Discharge: 2021-06-28 | Disposition: A | Discharge status: Home / Self Care | Payer: 59 | Source: Ambulatory Visit | Attending: Oncology | Admitting: Oncology

## 2021-06-28 ENCOUNTER — Encounter: Payer: Self-pay | Admitting: Hematology and Oncology

## 2021-06-28 DIAGNOSIS — Z1231 Encounter for screening mammogram for malignant neoplasm of breast: Secondary | ICD-10-CM

## 2021-07-02 ENCOUNTER — Other Ambulatory Visit: Payer: Self-pay

## 2021-07-05 ENCOUNTER — Inpatient Hospital Stay: Payer: 59 | Attending: Hematology and Oncology

## 2021-07-05 ENCOUNTER — Other Ambulatory Visit: Payer: Self-pay

## 2021-07-05 ENCOUNTER — Inpatient Hospital Stay (INDEPENDENT_AMBULATORY_CARE_PROVIDER_SITE_OTHER): Payer: 59 | Admitting: Hematology and Oncology

## 2021-07-05 ENCOUNTER — Ambulatory Visit: Payer: 59 | Admitting: Podiatry

## 2021-07-05 ENCOUNTER — Telehealth: Payer: Self-pay | Admitting: Hematology and Oncology

## 2021-07-05 ENCOUNTER — Other Ambulatory Visit: Payer: Self-pay | Admitting: Hematology and Oncology

## 2021-07-05 ENCOUNTER — Encounter: Payer: Self-pay | Admitting: Hematology and Oncology

## 2021-07-05 ENCOUNTER — Ambulatory Visit: Payer: 59 | Admitting: Hematology and Oncology

## 2021-07-05 ENCOUNTER — Other Ambulatory Visit: Payer: 59

## 2021-07-05 DIAGNOSIS — Z171 Estrogen receptor negative status [ER-]: Secondary | ICD-10-CM

## 2021-07-05 DIAGNOSIS — Z170 Estrogen receptor positive status [ER+]: Secondary | ICD-10-CM

## 2021-07-05 DIAGNOSIS — M8580 Other specified disorders of bone density and structure, unspecified site: Secondary | ICD-10-CM | POA: Diagnosis not present

## 2021-07-05 DIAGNOSIS — C50411 Malignant neoplasm of upper-outer quadrant of right female breast: Secondary | ICD-10-CM

## 2021-07-05 DIAGNOSIS — Z780 Asymptomatic menopausal state: Secondary | ICD-10-CM

## 2021-07-05 LAB — HEPATIC FUNCTION PANEL
ALT: 13 (ref 7–35)
AST: 18 (ref 13–35)
Alkaline Phosphatase: 130 — AB (ref 25–125)
Bilirubin, Total: 0.5

## 2021-07-05 LAB — CBC AND DIFFERENTIAL
HCT: 33 — AB (ref 36–46)
Hemoglobin: 10.6 — AB (ref 12.0–16.0)
Neutrophils Absolute: 4.32
Platelets: 223 (ref 150–399)
WBC: 6

## 2021-07-05 LAB — BASIC METABOLIC PANEL
BUN: 15 (ref 4–21)
CO2: 31 — AB (ref 13–22)
Chloride: 107 (ref 99–108)
Creatinine: 0.7 (ref 0.5–1.1)
Glucose: 90
Potassium: 4.4 (ref 3.4–5.3)
Sodium: 140 (ref 137–147)

## 2021-07-05 LAB — CBC: RBC: 3.75 — AB (ref 3.87–5.11)

## 2021-07-05 LAB — COMPREHENSIVE METABOLIC PANEL
Albumin: 3.2 — AB (ref 3.5–5.0)
Calcium: 8.8 (ref 8.7–10.7)

## 2021-07-05 NOTE — Progress Notes (Signed)
Patient Care Team: Rochel Brome, MD as PCP - General (Family Medicine) [name], MD as PCP - Electrophysiology (Cardiology) [name], MD as Consulting Physician (Cardiology) Derwood Kaplan, MD as Consulting Physician (Oncology) Pollyann Samples, MD as Consulting Physician (General Surgery) Misenheimer, Christia Reading, MD as Consulting Physician (Unknown Physician Specialty) Oneal Grout, DDS as Referring Physician (Orthodontics) Noberto Retort Juanda Bond., MD as Referring Physician (Surgery)  Clinic Day:  07/05/2021  Referring physician: Rochel Brome, MD  ASSESSMENT & PLAN:   Assessment & Plan: Breast cancer of upper-outer quadrant of right female breast [facility]) 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 She remains without evidence of recurrence. She will continue letrozole 2.5 mg daily for at least 5 years, until August 2024.  Osteopenia after menopause Osteopenia, for which she was started on yearly Reclast in July 2021. She continues OsCal BID. She had a recent CT for cardiac evaluation which revealed a rib fracture associated with trauma. She does report several falls over the last 6 months. Her PCP is helping to evaluate her issues with falling. She will be due for repeat bone density in July 2023, repeat clinic evaluation and Reclast in September.    The patient understands the plans discussed today and is in agreement with them.  She knows to contact our office if she develops concerns prior to her next appointment.    Monique Ped, NP  [facility] [address] [place] [address] Dept: [phone] Dept Fax: [phone]   No orders of the defined types were placed in this encounter.     CHIEF COMPLAINT:  CC: A 66 year old female with history of breast cancer here for 6 month evaluation  Current Treatment:  Letrozole/ Reclast  INTERVAL HISTORY:  Monique Kelly is here today for repeat  clinical assessment. She denies fevers or chills. She denies pain. Her appetite is good. Her weight has increased 4 pounds over last 6 months .  I have reviewed the past medical history, past surgical history, social history and family history with the patient and they are unchanged from previous note.  ALLERGIES:  is allergic to nitroglycerin.  MEDICATIONS:  Current Outpatient Medications  Medication Sig Dispense Refill   albuterol (VENTOLIN HFA) 108 (90 Base) MCG/ACT inhaler Inhale into the lungs as needed for wheezing or shortness of breath.     buPROPion (WELLBUTRIN XL) 300 MG 24 hr tablet Take 300 mg by mouth daily.     calcium carbonate (OS-CAL - DOSED IN MG OF ELEMENTAL CALCIUM) 1250 (500 Ca) MG tablet Take 1 tablet by mouth daily with breakfast.     cyanocobalamin 1000 MCG tablet Take by mouth.     ergocalciferol (VITAMIN D2) 1.25 MG (50000 UT) capsule Take by mouth.     Evolocumab (REPATHA SURECLICK) 989 MG/ML SOAJ Inject 1 pen into the skin every 14 (fourteen) days. 2 mL 11   gabapentin (NEURONTIN) 300 MG capsule 3 in am and 1 at night. 360 capsule 1   letrozole (FEMARA) 2.5 MG tablet Take by mouth.     levothyroxine (SYNTHROID) 75 MCG tablet Take 75 mcg by mouth daily.      metoprolol tartrate (LOPRESSOR) 100 MG tablet Take 1 tablet (100 mg total) by mouth once for 1 dose. Two hours prior to procedure 1 tablet 0   omeprazole (PRILOSEC) 40 MG capsule Take 1 capsule (40 mg total) by mouth in the morning and at bedtime. 180 capsule 3  Zoledronic Acid (RECLAST IV) Inject into the vein.     No current facility-administered medications for this visit.    HISTORY OF PRESENT ILLNESS:   Oncology History  Breast cancer of upper-outer quadrant of right female breast ([place])  05/30/2017 Initial Diagnosis   Breast cancer of upper-outer quadrant of right female breast ([place])   06/10/2017 Cancer Staging   Staging form: Breast, AJCC 8th Edition - Clinical stage from 06/10/2017: Stage IIA (cT2,  cN0(sn), cM0, G3, ER+, PR+, HER2-) - Signed by Derwood Kaplan, MD on 01/24/2021 Histopathologic type: Infiltrating duct carcinoma, NOS Stage prefix: Initial diagnosis Method of lymph node assessment: Sentinel lymph node biopsy Nuclear grade: G3 Multigene prognostic tests performed: EndoPredict Histologic grading system: 3 grade system Laterality: Right Tumor size (mm): 21 Lymph-vascular invasion (LVI): LVI not present (absent)/not identified Diagnostic confirmation: Positive histology Specimen type: Excision Staged by: Managing physician Menopausal status: Postmenopausal Ki-67 (%): 20 EndoPredict EPclin risk score: 4 EndoPredict EPclin risk level: High risk EndoPredict 10-year percentage risk of distant recurrence (%): 18 Stage used in treatment planning: Yes National guidelines used in treatment planning: Yes Type of national guideline used in treatment planning: NCCN Staging comments: TC x 4, lumpectomy, radiation, letrozole since Aug 2019        REVIEW OF SYSTEMS:   Constitutional: Denies fevers, chills or abnormal weight loss Eyes: Denies blurriness of vision Ears, nose, mouth, throat, and face: Denies mucositis or sore throat Respiratory: Denies cough, dyspnea or wheezes Cardiovascular: Denies palpitation, chest discomfort or lower extremity swelling Gastrointestinal:  Denies nausea, heartburn or change in bowel habits Skin: Denies abnormal skin rashes Lymphatics: Denies new lymphadenopathy or easy bruising Neurological:Denies numbness, tingling or new weaknesses Behavioral/Psych: Mood is stable, no new changes  All other systems were reviewed with the patient and are negative.   VITALS:  Blood pressure (!) 150/79, pulse 78, temperature 98.3 F (36.8 C), temperature source Oral, resp. rate 20, height _0  (1.702 m), weight 272 lb (123.4 kg), SpO2 95 %.  Wt Readings from Last 3 Encounters:  07/05/21 272 lb (123.4 kg)  05/28/21 268 lb (121.6 kg)  05/18/21 267  lb (121.1 kg)    Body mass index is 42.6 kg/m.  Performance status (ECOG): 1 - Symptomatic but completely ambulatory  PHYSICAL EXAM:   GENERAL:alert, no distress and comfortable SKIN: skin color, texture, turgor are normal, no rashes or significant lesions EYES: normal, Conjunctiva are pink and non-injected, sclera clear OROPHARYNX:no exudate, no erythema and lips, buccal mucosa, and tongue normal  NECK: supple, thyroid normal size, non-tender, without nodularity LYMPH:  no palpable lymphadenopathy in the cervical, axillary or inguinal LUNGS: clear to auscultation and percussion with normal breathing effort HEART: regular rate & rhythm and no murmurs and no lower extremity edema ABDOMEN:abdomen soft, non-tender and normal bowel sounds Musculoskeletal:no cyanosis of digits and no clubbing  NEURO: alert & oriented x 3 with fluent speech, no focal motor/sensory deficits  LABORATORY DATA:  I have reviewed the data as listed    Component Value Date/Time   NA 140 07/05/2021 0000   K 4.4 07/05/2021 0000   CL 107 07/05/2021 0000   CO2 31 (A) 07/05/2021 0000   GLUCOSE 90 05/18/2021 1545   GLUCOSE 100 (H) 07/25/2019 0827   BUN 15 07/05/2021 0000   CREATININE 0.7 07/05/2021 0000   CREATININE 0.88 05/18/2021 1545   CREATININE 0.86 08/24/2015 1257   CALCIUM 8.8 07/05/2021 0000   PROT 6.7 06/07/2021 0932   ALBUMIN 3.2 (A) 07/05/2021 0000  ALBUMIN 3.8 06/07/2021 0932   AST 18 07/05/2021 0000   ALT 13 07/05/2021 0000   ALKPHOS 130 (A) 07/05/2021 0000   BILITOT 0.6 06/07/2021 0932   GFRNONAA >60 07/25/2019 0827   GFRAA >60 07/25/2019 0827    No results found for: SPEP, UPEP  Lab Results  Component Value Date   WBC 6.0 07/05/2021   NEUTROABS 4.32 07/05/2021   HGB 10.6 (A) 07/05/2021   HCT 33 (A) 07/05/2021   MCV 93 02/02/2021   PLT 223 07/05/2021      Chemistry      Component Value Date/Time   NA 140 07/05/2021 0000   K 4.4 07/05/2021 0000   CL 107 07/05/2021 0000    CO2 31 (A) 07/05/2021 0000   BUN 15 07/05/2021 0000   CREATININE 0.7 07/05/2021 0000   CREATININE 0.88 05/18/2021 1545   CREATININE 0.86 08/24/2015 1257   GLU 90 07/05/2021 0000      Component Value Date/Time   CALCIUM 8.8 07/05/2021 0000   ALKPHOS 130 (A) 07/05/2021 0000   AST 18 07/05/2021 0000   ALT 13 07/05/2021 0000   BILITOT 0.6 06/07/2021 0932       RADIOGRAPHIC STUDIES: I have personally reviewed the radiological images as listed and agreed with the findings in the report. MM 3D SCREEN BREAST BILATERAL  Result Date: 06/28/2021 CLINICAL DATA:  Screening. EXAM: DIGITAL SCREENING BILATERAL MAMMOGRAM WITH TOMOSYNTHESIS AND CAD TECHNIQUE: Bilateral screening digital craniocaudal and mediolateral oblique mammograms were obtained. Bilateral screening digital breast tomosynthesis was performed. The images were evaluated with computer-aided detection. COMPARISON:  Previous exam(s). ACR Breast Density Category b: There are scattered areas of fibroglandular density. FINDINGS: There are no findings suspicious for malignancy. IMPRESSION: No mammographic evidence of malignancy. A result letter of this screening mammogram will be mailed directly to the patient. RECOMMENDATION: Screening mammogram in one year. (Code:SM-B-01Y) BI-RADS CATEGORY  1: Negative. Electronically Signed   By: Lillia Mountain M.D.   On: 06/28/2021 13:56

## 2021-07-05 NOTE — Assessment & Plan Note (Signed)
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 She remains without evidence of recurrence. She will continue letrozole 2.5 mg daily for at least 5 years, until August 2024.

## 2021-07-05 NOTE — Assessment & Plan Note (Addendum)
Osteopenia, for which she was started on yearly Reclast in July 2021. She continues OsCal BID. She had a recent CT for cardiac evaluation which revealed a rib fracture associated with trauma. She does report several falls over the last 6 months. Her PCP is helping to evaluate her issues with falling. She will be due for repeat bone density in July 2023, repeat clinic evaluation and Reclast in September.

## 2021-07-05 NOTE — Telephone Encounter (Signed)
Per 07/05/21 los next appt scheduled and confirmed with patient °

## 2021-07-12 ENCOUNTER — Ambulatory Visit: Payer: 59 | Admitting: Podiatry

## 2021-07-12 ENCOUNTER — Ambulatory Visit: Payer: 59 | Admitting: Physician Assistant

## 2021-07-12 ENCOUNTER — Telehealth: Payer: Self-pay | Admitting: *Deleted

## 2021-07-12 MED ORDER — METOPROLOL SUCCINATE ER 25 MG PO TB24: 25.0000 mg | ORAL_TABLET | Freq: Every day | ORAL | 1 refills | Status: DC

## 2021-07-12 NOTE — Telephone Encounter (Signed)
-----   Message from [place], [place] sent at 07/12/2021  8:14 AM EST ----- All in rhythm was pretty good. Did have some Afib, though appears was less then 24 hours  Frequent PACs (early beats). I think we should start Toprol XL 25mg  daily

## 2021-07-12 NOTE — Telephone Encounter (Signed)
Lvm to contact direct number location  for week  Pod J [phone]  and Left Mychart message of recommendations also.

## 2021-07-19 ENCOUNTER — Ambulatory Visit: Payer: 59 | Admitting: Podiatry

## 2021-07-20 ENCOUNTER — Other Ambulatory Visit: Payer: Self-pay

## 2021-07-20 ENCOUNTER — Encounter: Payer: Self-pay | Admitting: Family Medicine

## 2021-07-20 ENCOUNTER — Ambulatory Visit (INDEPENDENT_AMBULATORY_CARE_PROVIDER_SITE_OTHER): Payer: 59 | Admitting: Family Medicine

## 2021-07-20 VITALS — BP 108/62 | HR 69 | Temp 98.5°F | Ht 67.0 in | Wt 268.0 lb

## 2021-07-20 DIAGNOSIS — W19XXXA Unspecified fall, initial encounter: Secondary | ICD-10-CM

## 2021-07-20 DIAGNOSIS — M79662 Pain in left lower leg: Secondary | ICD-10-CM | POA: Diagnosis not present

## 2021-07-20 DIAGNOSIS — S8002XA Contusion of left knee, initial encounter: Secondary | ICD-10-CM

## 2021-07-20 HISTORY — DX: Unspecified fall, initial encounter: W19.XXXA

## 2021-07-20 MED ORDER — NAPROXEN 500 MG PO TABS: 500.0000 mg | ORAL_TABLET | Freq: Two times a day (BID) | ORAL | 0 refills | Status: DC

## 2021-07-20 NOTE — Assessment & Plan Note (Signed)
Caution with ambulation. No foot drop.

## 2021-07-20 NOTE — Assessment & Plan Note (Signed)
Left knee xray ordered. Take omeprazole 40 mg twice a day. Naproxen 500 mg one twice a day with meal.

## 2021-07-20 NOTE — Assessment & Plan Note (Signed)
Take omeprazole 40 mg twice a day. Naproxen 500 mg one twice a day with meal.  Tib/Fib xray ordered.

## 2021-07-20 NOTE — Progress Notes (Signed)
Acute Office Visit  Subjective:    Patient ID: Monique Kelly, female    DOB: 1956/01/06, 66 y.o.   MRN: [id number]  Chief Complaint  Patient presents with   LEFT LEG/KNEE PAIN    [place] 2-3 weeks ago two different times and fell on left leg and knee both times.    HPI Patient is in today for two falls occurring within a couple of days of each other. Both landed on left leg and left knee.  Ice and heat, elevated, resting. No nsaids due to history of PUD 10 yrs ago. Tylenol and tramadol does not help. Did not hit her head.   Past Medical History:  Diagnosis Date   Atrial fibrillation [facility]) 2016   Sees Dr. [name]. Cardiac ablation, loop recorder (removed in 2022)   Atrial fibrillation with rapid ventricular response ([place]) 04/2015   Breast cancer ([place])    right lumpectomy   BRONCHITIS 03/10/2009   Qualifier: Diagnosis of  By: Geoffry Paradise MD, Kristine Royal    Chest pain with moderate risk for cardiac etiology 04/28/2015   Dehydration 06/18/2009   Qualifier: Diagnosis of  By: Pete Pelt     Depression    Family history of breast cancer    Family history of colon cancer    Fibromyalgia 2010   Hyperlipidemia    Hypothyroidism (acquired) 2006   s/p thyroidectomy for nodules   Morbid obesity ([place]) 08/10/2015   Obesity    gastric bpg 2011, weight then 400 lbs, lost 100 lbs in 60 days.   Obstructive sleep apnea    does not use cpap, does not have a following physician   Paroxysmal atrial fibrillation ([place]) 08/10/2015    Past Surgical History:  Procedure Laterality Date   ABDOMINAL HYSTERECTOMY  1992   total.    APPENDECTOMY     BREAST SURGERY  2019   right lumpectomy    CHOLECYSTECTOMY     COLONOSCOPY     ELECTROPHYSIOLOGIC STUDY N/A 09/08/2015   Procedure: Atrial Fibrillation Ablation;  Surgeon: [name], MD;  Location: [facility];  Service: Cardiovascular;  Laterality: N/A;   EP IMPLANTABLE DEVICE N/A 12/17/2015   Procedure: Loop Recorder Insertion;  Surgeon:  [name], MD;  Location: [facility];  Service: Cardiovascular;  Laterality: N/A;   ESOPHAGOGASTRODUODENOSCOPY     EXPLORATORY LAPAROTOMY WITH ABDOMINAL MASS EXCISION  2018   GASTRIC BYPASS  2011   lost 100 lbs, but gained some back.   implantable loop recorder removal  03/25/2020   removed 2022   THYROIDECTOMY  2006   for nodules ( hot and cold nodules) Goiter compressing trachea.    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 Works as Technical brewer for [employer].   Right handed   Social Determinants of Health   Financial Resource Strain: Not on file  Food Insecurity: Not on file  Transportation Needs: Not on file  Physical Activity: Not on file  Stress: Not on file  Social Connections: Not on file  Intimate Partner Violence: Not on file    Outpatient Medications Prior to Visit  Medication Sig Dispense Refill   albuterol (VENTOLIN HFA) 108 (90 Base) MCG/ACT inhaler Inhale into the lungs as needed for wheezing or shortness of breath.     buPROPion (WELLBUTRIN XL) 300 MG 24 hr tablet Take 300 mg by mouth daily.     calcium carbonate (OS-CAL - DOSED IN MG OF ELEMENTAL  CALCIUM) 1250 (500 Ca) MG tablet Take 1 tablet by mouth daily with breakfast.     cyanocobalamin 1000 MCG tablet Take by mouth.     ergocalciferol (VITAMIN D2) 1.25 MG (50000 UT) capsule Take by mouth.     Evolocumab (REPATHA SURECLICK) 035 MG/ML SOAJ Inject 1 pen into the skin every 14 (fourteen) days. 2 mL 11   gabapentin (NEURONTIN) 300 MG capsule 3 in am and 1 at night. 360 capsule 1   letrozole (FEMARA) 2.5 MG tablet Take by mouth.     levothyroxine (SYNTHROID) 75 MCG tablet Take 75 mcg by mouth daily.      metoprolol succinate (TOPROL XL) 25 MG 24 hr tablet Take 1 tablet (25 mg total) by mouth daily. 90 tablet 1   omeprazole (PRILOSEC) 40 MG capsule Take 1 capsule (40 mg total) by mouth in the morning and at bedtime. 180 capsule 3   Zoledronic Acid (RECLAST IV) Inject into the vein.     No facility-administered medications prior to visit.    Allergies  Allergen Reactions   Nitroglycerin     Hypotension, difficulty maintaining conciousness Hypotension, difficulty maintaining conciousness Hypotension, difficulty maintaining conciousness    Review of Systems  Constitutional:  Negative for appetite change, fatigue and fever.  HENT:  Negative for congestion, ear pain, sinus pressure and sore throat.   Respiratory:  Negative for cough, chest tightness, shortness of breath and wheezing.   Cardiovascular:  Negative for chest pain and palpitations.  Gastrointestinal:  Negative for abdominal pain, constipation, diarrhea, nausea and vomiting.  Genitourinary:  Negative for dysuria and hematuria.  Musculoskeletal:  Positive for joint swelling (Left leg/knee) and myalgias (Left leg/knee). Negative for arthralgias and back pain.  Skin:  Negative for rash.  Neurological:  Negative for dizziness, weakness and headaches.  Psychiatric/Behavioral:  Negative for dysphoric mood. The patient is not nervous/anxious.       Objective:    Physical Exam Vitals reviewed.  Constitutional:       Appearance: She is obese.  Musculoskeletal:        General: Tenderness (medial inferior knee. increased warm. edema present. tender along left shin.) present.     Comments: Poor ROM of left knee due to swelling and pain. Limited assessment.  Neurological:     Mental Status: She is alert.     Comments: Strength 5/5 dorsiflexion and plantar flexion.    BP 108/62 (BP Location: Right Arm, Patient Position: Sitting)    Pulse 69  Temp 98.5 F (36.9 C) (Oral)    Ht '5\' 7"'  (1.702 m)    Wt 268 lb (121.6 kg)    SpO2 93%    BMI 41.97 kg/m  Wt Readings from Last 3 Encounters:  07/20/21 268 lb (121.6 kg)  07/05/21 272 lb (123.4 kg)  05/28/21 268 lb (121.6 kg)    Health Maintenance Due  Topic Date Due   PAP SMEAR-Modifier  Never done   COVID-19 Vaccine (3 - Booster for Janssen series) 05/14/2021    There are no preventive care reminders to display for this patient.   Lab Results  Component Value Date   TSH 1.581 01/20/2021   Lab Results  Component Value Date   WBC 6.0 07/05/2021   HGB 10.6 (A) 07/05/2021   HCT 33 (A) 07/05/2021   MCV 93 02/02/2021   PLT 223 07/05/2021   Lab Results  Component Value Date   NA 140 07/05/2021   K 4.4 07/05/2021   CO2 31 (A) 07/05/2021   GLUCOSE 90 05/18/2021   BUN 15 07/05/2021   CREATININE 0.7 07/05/2021   BILITOT 0.6 06/07/2021   ALKPHOS 130 (A) 07/05/2021   AST 18 07/05/2021   ALT 13 07/05/2021   PROT 6.7 06/07/2021   ALBUMIN 3.2 (A) 07/05/2021   CALCIUM 8.8 07/05/2021   ANIONGAP 11 07/25/2019   EGFR 73 05/18/2021   Lab Results  Component Value Date   CHOL 138 06/07/2021   Lab Results  Component Value Date   HDL 54 06/07/2021   Lab Results  Component Value Date   LDLCALC 67 06/07/2021   Lab Results  Component Value Date   TRIG 93 06/07/2021   Lab Results  Component Value Date   CHOLHDL 2.6 06/07/2021   No results found for: HGBA1C       Assessment & Plan:   Problem List Items Addressed This Visit       Other    Contusion of left knee - Primary    Left knee xray ordered. Take omeprazole 40 mg twice a day. Naproxen 500 mg one twice a day with meal.       Relevant Medications   naproxen (NAPROSYN) 500 MG tablet   Other Relevant Orders   DG Knee Complete 4 Views Left   Pain in left lower leg    Take omeprazole 40 mg twice a day. Naproxen 500 mg one twice a day with meal.  Tib/Fib xray ordered.       Relevant Orders   DG Tibia/Fibula Left   Fall    Caution with ambulation. No foot drop.          I,Lauren M Auman,acting as a scribe for Rochel Brome, MD.,have documented all relevant documentation on the behalf of Rochel Brome, MD,as directed by  Rochel Brome, MD while in the presence of Rochel Brome, MD.   Rochel Brome, MD

## 2021-07-20 NOTE — Patient Instructions (Addendum)
Take omeprazole 40 mg twice a day. Naproxen 500 mg one twice a day with meal.

## 2021-07-21 NOTE — Progress Notes (Unsigned)
Cardiology Office Note Date:  07/21/2021  Patient ID:  Monique, Kelly Sep 25, 1955, MRN [id number] PCP:  Rochel Brome, MD  Cardiologist/Electrophysiologist: Dr. Rayann Heman    Chief Complaint:   *** 72mo   History of Present Illness: Monique Kelly is a 66 y.o. female with history of hypothyroidism (s/p thyroidectomy 2/2 nodules), HLD, OSA (uses dental appliance), obesity w/hx of gastric bypass, breast cancer, and AFib, fibromyalgia  She has had a couple years of waxing/waning though fairly chronic c/o SOB, CP  I saw her 07/31/19 Unfortunately she still remains very fatigued, no energy.  Ever since her COVID infection she has never felt well again (she tested + on 06/24/2019) She remains extremely tired, fatigued all day, and with minimal exertion like showering, making the bed she gets very SOB and has to sit. She has not had the same CP/symptoms as she was having before COVID, but when she gets more SOB feels like her chest is heavy She was concerned about the finding of coronary calcifications on her CT and wanted to come in.  She states the day she went to the ER at [facility], she was woken feeling very SOB, felt like her heart was fast and felt clammy, she tried to relax, and eventually felt better.  She that morning got up, ready for work, and on the drive in again became SOB, would not settle dowm had heaviness in her chest and went to the ER. She says the labs, EKG all done with active symptoms. She does think that the new inhaler is more helpful then the one Rx at [place]. No dizzy spells, no reports of near syncope or syncope. Unfortunately her job despite the note from the ER MD will not let her work from home. She is having trouble getting in to see her PMD. She mentions again today that she does tend to worry and let her mind get the better of her at times Recommended f/u with her PMD for work issues/notes Urged to revisit dental appliance when she could for her apnea CP not  suspect to be coronary, planned to update her echo.  Saw ENT 03/2020 for her sleep apnea, looks like plan was to revisit PAP therapy  She saw D. Allred as well 11/201, ILR was EOS and removed  MOST recently for EP saw A. Tillery, PA-C 02/08/21 2/2 to an abnormal EKG, she reported some occasional palpitations, Ekg that prompted her visit he suspected artifact with low voltage on the machine rather then true missing P waves Planned to have her wear a monitor if increased palpitations Referred to the lipid clinic for HLD, having failed statins historically   I saw her  05/18/21 She is feeling well today Reports that about 2 weeks ago for about 5 days she felt fairly terrible with palpitations that felt disctinctly different then her AFib used to, these associated with dizziness with the feeling of being off balance, SOB and had R ear pain> her jaw and some chest dicomfort These always occurred together, more typically in the evenings and could last 10 minutes to 5-6 hours in duration. Then they were just gone for a couple days, then back again for a day or so and now has been resolved. No near syncope or syncope Low suspicion of coronary etiology of her jaw pain though planned for CT, and monitor for recent new palpitations  Coronary CT with non-obstructive disease She had some PAF on  her monitor and started toprol  *** CHADS  score *** symptoms, toprol? Brady/ *** lipids.... *** risk reduction   Device information: MDT ILR, implanted 12/17/15 >> EOS >> explanted Nov 2021  AF HX PVI ablation, Dr. Rayann Heman 09/08/2015 Flecainide stopped Aug 2017  Past Medical History:  Diagnosis Date   Atrial fibrillation [facility]) 2016   Sees Dr. [name]. Cardiac ablation, loop recorder (removed in 2022)   Atrial fibrillation with rapid ventricular response ([place]) 04/2015   Breast cancer ([place])    right lumpectomy   BRONCHITIS 03/10/2009   Qualifier: Diagnosis of  By: Geoffry Paradise MD, Kristine Royal    Chest  pain with moderate risk for cardiac etiology 04/28/2015   Dehydration 06/18/2009   Qualifier: Diagnosis of  By: Pete Pelt     Depression    Family history of breast cancer    Family history of colon cancer    Fibromyalgia 2010   Hyperlipidemia    Hypothyroidism (acquired) 2006   s/p thyroidectomy for nodules   Morbid obesity ([place]) 08/10/2015   Obesity    gastric bpg 2011, weight then 400 lbs, lost 100 lbs in 60 days.   Obstructive sleep apnea    does not use cpap, does not have a following physician   Paroxysmal atrial fibrillation ([place]) 08/10/2015    Past Surgical History:  Procedure Laterality Date   ABDOMINAL HYSTERECTOMY  1992   total.    APPENDECTOMY     BREAST SURGERY  2019   right lumpectomy    CHOLECYSTECTOMY     COLONOSCOPY     ELECTROPHYSIOLOGIC STUDY N/A 09/08/2015   Procedure: Atrial Fibrillation Ablation;  Surgeon: [name], MD;  Location: [facility];  Service: Cardiovascular;  Laterality: N/A;   EP IMPLANTABLE DEVICE N/A 12/17/2015   Procedure: Loop Recorder Insertion;  Surgeon: [name], MD;  Location: [facility];  Service: Cardiovascular;  Laterality: N/A;   ESOPHAGOGASTRODUODENOSCOPY     EXPLORATORY LAPAROTOMY WITH ABDOMINAL MASS EXCISION  2018   GASTRIC BYPASS  2011   lost 100 lbs, but gained some back.   implantable loop recorder removal  03/25/2020   removed 2022   THYROIDECTOMY  2006   for nodules ( hot and cold nodules) Goiter compressing trachea.    Current Outpatient Medications  Medication Sig Dispense Refill   albuterol (VENTOLIN HFA) 108 (90 Base) MCG/ACT inhaler Inhale into the lungs as needed for wheezing or shortness of breath.     buPROPion (WELLBUTRIN XL) 300 MG 24 hr tablet Take 300 mg by mouth daily.     calcium carbonate (OS-CAL - DOSED IN MG OF ELEMENTAL CALCIUM) 1250 (500 Ca) MG tablet Take 1 tablet by mouth daily with breakfast.     cyanocobalamin 1000 MCG tablet Take by mouth.     ergocalciferol (VITAMIN  D2) 1.25 MG (50000 UT) capsule Take by mouth.     Evolocumab (REPATHA SURECLICK) 829 MG/ML SOAJ Inject 1 pen into the skin every 14 (fourteen) days. 2 mL 11   gabapentin (NEURONTIN) 300 MG capsule 3 in am and 1 at night. 360 capsule 1   letrozole (FEMARA) 2.5 MG tablet Take by mouth.     levothyroxine (SYNTHROID) 75 MCG tablet Take 75 mcg by mouth daily.      metoprolol succinate (TOPROL XL) 25 MG 24 hr tablet Take 1 tablet (25 mg total) by mouth daily. 90 tablet 1   naproxen (NAPROSYN) 500 MG tablet Take 1 tablet (500 mg total) by mouth 2 (two) times daily with a meal. 28 tablet 0  omeprazole (PRILOSEC) 40 MG capsule Take 1 capsule (40 mg total) by mouth in the morning and at bedtime. 180 capsule 3   Zoledronic Acid (RECLAST IV) Inject into the vein.     No current facility-administered medications for this visit.    Allergies:   Nitroglycerin   Social History:  The patient  reports that she has never smoked. She has never used smokeless tobacco. She reports that she does not drink alcohol and does not use drugs.   Family History:  The patient's family history includes Cancer in her father and mother; Heart failure in her father, maternal grandfather, and maternal grandmother.  ROS:  Please see the history of present illness.  All other systems are reviewed and otherwise negative.   PHYSICAL EXAM:  VS:  There were no vitals taken for this visit. BMI: There is no height or weight on file to calculate BMI. Well nourished, well developed, in no acute distress  HEENT: normocephalic, atraumatic  Neck: no JVD, carotid bruits or masses Cardiac:  *** RRR; no significant murmurs, no rubs, or gallops Lungs:   *** CTA b/l, no wheezing, rhonchi or rales  Abd: soft, nontender MS: no deformity or atrophy Ext: *** no edema  Skin: warm and dry, no rash Neuro:  No gross deficits appreciated Psych: euthymic mood, full affect    EKG:  not done today  Feb 2023, monitor Predominant rhythm is  sinus rhythmn Frequent premature atrial contractions are noted Atrial fibrillation is observed A single 10 beat run of nonsustained ventricular tachycardia is observed   06/02/21: Coronary CTa IMPRESSION: 1. Coronary calcium score of 296. This was 93rd percentile for age, sex, and race matched control.   2. Normal coronary origin with right dominance.   3. CAD-RADS 3. Moderate stenosis. Consider symptom-guided anti-ischemic pharmacotherapy as well as risk factor modification per guideline directed care.   We will attempt to send for additional analysis with CT FFR, but given the level of artifact this analysis may not be available. Cardiac catheterization may be reasonable.  FINDINGS: CT-FFR analysis was performed on the original cardiac CT angiogram dataset. Diagrammatic representation of the CT-FFR analysis is provided in a separate PDF document in PACS. This dictation was created using the PDF document and an interactive 3D model of the results. 3D model is not available in the EMR/PACS. Normal FFR range is >0.80.   1. Left Main: No significant functional stenosis, CT-FFR 0.98 at lesion.   2. LAD: No significant functional stenosis, CT-FFR 0.96 at lesion. 3. LCX: No significant functional stenosis, CT-FFR 0.97 at lesion. 4. RCA: Unable to model due to artifact but no associated lesion.   IMPRESSION: 1. CT FFR analysis shows no evidence of significant functional stenosis.  08/16/2019: TTE IMPRESSIONS   1. Left ventricular ejection fraction, by estimation, is 60 to 65%. The  left ventricle has normal function. The left ventricle has no regional  wall motion abnormalities. Left ventricular diastolic parameters are  consistent with Grade I diastolic  dysfunction (impaired relaxation).   2. Right ventricular systolic function is normal. The right ventricular  size is normal. There is normal pulmonary artery systolic pressure.   3. The mitral valve is normal in structure.  No evidence of mitral valve  regurgitation. No evidence of mitral stenosis.   4. The aortic valve is tricuspid. Aortic valve regurgitation is not  visualized. Mild aortic valve sclerosis is present, with no evidence of  aortic valve stenosis.   5. The inferior vena cava is  normal in size with greater than 50%  respiratory variability, suggesting right atrial pressure of 3 mmHg.   04/29/2019: stress myoview Nuclear stress EF: 54%. There was no ST segment deviation noted during stress. The study is normal. This is a low risk study. The left ventricular ejection fraction is normal (55-65%).   Normal pharmacologic nuclear stress test with no evidence for prior infarct or ischemia.  LVEF 54%.   12/26/2018: TTE IMPRESSIONS  1. The left ventricle has normal systolic function, with an ejection fraction of 55-60%. The cavity size was normal. Diastolic dysfunction, grade indeterminate. Indeterminate filling pressures.  2. The right ventricle has normal systolic function. The cavity was normal. There is no increase in right ventricular wall thickness.  3. The aortic valve is tricuspid. Mild aortic annular calcification noted.  4. The mitral valve is grossly normal. Mild thickening of the mitral valve leaflet.  5. The tricuspid valve is grossly normal.  6. The aorta is normal in size and structure.  7. The inferior vena cava was dilated in size with >50% respiratory variability.   06/26/17: TTE Study Conclusions - Left ventricle: The cavity size was normal. Systolic function was   normal. The estimated ejection fraction was in the range of 60%   to 65%. Wall motion was normal; there were no regional wall   motion abnormalities. - Left atrium: The atrium was mildly dilated. - Pulmonary arteries: Systolic pressure was mildly increased. PA   peak pressure: 32 mm Hg (S).   Impressions: - Several findings suggest possible elevation in mean left atrial   pressure, but this cannot be established  with confidence.   09/08/15: EPS/Ablation CONCLUSIONS: 1. Sinus rhythm upon presentation.   2. Intracardiac echo reveals a moderate sized left atrium with a common ostium to the left superior and inferior pulmonary veins.  A PFO was also noted 3. Successful electrical isolation and anatomical encircling of all four pulmonary veins with radiofrequency current. 4. No inducible arrhythmias following ablation both on and off of dobutamine  5. No early apparent complications.  05/22/15: Lexiscan stress test The left ventricular ejection fraction is normal (55-65%). Nuclear stress EF: 60%. The study is normal. This is a low risk study.   Breast Attenuation no ischemia / infarction EF 60% ECG tachycardic with periods of PAF during study   Recent Labs: 11/25/2020: Magnesium 1.9 01/20/2021: TSH 1.581 07/05/2021: ALT 13; BUN 15; Creatinine 0.7; Hemoglobin 10.6; Platelets 223; Potassium 4.4; Sodium 140  06/07/2021: Chol/HDL Ratio 2.6; Cholesterol, Total 138; HDL 54; LDL Chol Calc (NIH) 67; Triglycerides 93   Estimated Creatinine Clearance: 96 mL/min (by C-G formula based on SCr of 0.7 mg/dL).   Wt Readings from Last 3 Encounters:  07/20/21 268 lb (121.6 kg)  07/05/21 272 lb (123.4 kg)  05/28/21 268 lb (121.6 kg)     Other studies reviewed: Additional studies/records reviewed today include: summarized above  ASSESSMENT AND PLAN:   1. Afib     CHA2DS2Vasc remains one for female, *** not on a/c     ***   2. OSA  Has dental appliance and sleeps quite well      3. Jaw pain, chest discomfort     Negative stress test Dec 2020     *** Will pursue coronary CT though suspect non-cardiac etiology   Disposition: ***   Current medicines are reviewed at length with the patient today.  The patient did not have any concerns regarding medicines.  Venetia Night, PA-C 07/21/2021 12:00 PM  [place] [id number] [phone]  (office)  [phone] (fax)

## 2021-07-26 ENCOUNTER — Ambulatory Visit: Payer: 59 | Admitting: Physician Assistant

## 2021-07-27 ENCOUNTER — Telehealth: Payer: Self-pay

## 2021-07-27 NOTE — Telephone Encounter (Signed)
General/Other - results and antibiotic ? ?1: patient is requesting antibiotic until she can see dentist for painful tooth. Tooth is located on lower right side in the back. Rates pain 7/10. She cannot see dentist until the 23rd.  ? ?2: She is also requesting her knee and leg xray results.  ? ?Harrell Lark 07/27/21 11:56 AM ? ? ?

## 2021-07-27 NOTE — Telephone Encounter (Signed)
Left VM for return call.  ? ?Harrell Lark 07/27/21 2:42 PM ? ?

## 2021-07-28 ENCOUNTER — Other Ambulatory Visit: Payer: Self-pay | Admitting: Family Medicine

## 2021-07-28 DIAGNOSIS — S8002XA Contusion of left knee, initial encounter: Secondary | ICD-10-CM

## 2021-07-28 NOTE — Telephone Encounter (Signed)
Left VM to return call.  ? ?Harrell Lark 07/28/21 9:20 AM ? ?

## 2021-07-29 NOTE — Telephone Encounter (Signed)
Appointment made. Xrays not with [facility].  ? ?Harrell Lark 07/29/21 9:13 AM ? ? ?

## 2021-08-02 ENCOUNTER — Ambulatory Visit: Payer: 59 | Admitting: Family Medicine

## 2021-08-02 ENCOUNTER — Other Ambulatory Visit: Payer: Self-pay

## 2021-08-02 ENCOUNTER — Telehealth: Payer: Self-pay

## 2021-08-02 DIAGNOSIS — S8002XA Contusion of left knee, initial encounter: Secondary | ICD-10-CM

## 2021-08-02 DIAGNOSIS — M79662 Pain in left lower leg: Secondary | ICD-10-CM

## 2021-08-02 NOTE — Telephone Encounter (Signed)
Patient was informed about xray results of left leg and left knee which were normal, and patient stated that she is still having really bad left knee pain and its not better and states she can still hardly walk on the knee and is wanting to know what she needs to do next? Please advise. ?

## 2021-08-03 NOTE — Telephone Encounter (Signed)
Per Dr. Tobie Poet, " Recommend return for knee injection." I have left a message on both of the numbers listed in the chart requesting the patient to call back for an appointment for an injection. Waiting for patient to call back. ?

## 2021-08-04 ENCOUNTER — Ambulatory Visit (INDEPENDENT_AMBULATORY_CARE_PROVIDER_SITE_OTHER): Payer: 59 | Admitting: Family Medicine

## 2021-08-04 ENCOUNTER — Encounter: Payer: Self-pay | Admitting: Family Medicine

## 2021-08-04 ENCOUNTER — Other Ambulatory Visit: Payer: Self-pay

## 2021-08-04 VITALS — BP 88/48 | HR 70 | Temp 96.9°F | Resp 16 | Ht 67.0 in | Wt 262.0 lb

## 2021-08-04 DIAGNOSIS — R0789 Other chest pain: Secondary | ICD-10-CM

## 2021-08-04 DIAGNOSIS — I951 Orthostatic hypotension: Secondary | ICD-10-CM | POA: Insufficient documentation

## 2021-08-04 DIAGNOSIS — I480 Paroxysmal atrial fibrillation: Secondary | ICD-10-CM

## 2021-08-04 DIAGNOSIS — D508 Other iron deficiency anemias: Secondary | ICD-10-CM | POA: Diagnosis not present

## 2021-08-04 DIAGNOSIS — M25562 Pain in left knee: Secondary | ICD-10-CM

## 2021-08-04 MED ORDER — TRIAMCINOLONE ACETONIDE 40 MG/ML IJ SUSP: 80.0000 mg | Freq: Once | INTRAMUSCULAR | Status: DC

## 2021-08-04 NOTE — Assessment & Plan Note (Signed)
Sent to ED by private vehicle as patient refused EMS AMA. She drove herself to [facility].  ?Will need work up.  ?

## 2021-08-04 NOTE — Assessment & Plan Note (Signed)
Check labs 

## 2021-08-04 NOTE — Assessment & Plan Note (Signed)
Dorothyann Peng, PAC. ?Has pacemaker.  ?On metoprolol ?

## 2021-08-04 NOTE — Addendum Note (Signed)
Addended by: Oleta Mouse on: 08/04/2021 02:03 PM ? ? Modules accepted: Orders ? ?

## 2021-08-04 NOTE — Assessment & Plan Note (Signed)
Abnormal Coronary CTA. Cardiology was considering a heart cath. Consider transfer based on lab workup. No chest pain now.  ?Current EKG: NSR. No ST changes ?

## 2021-08-04 NOTE — Assessment & Plan Note (Signed)
Risks were discussed including bleeding, infection, increase in sugars if diabetic, atrophy at site of injection, and increased pain.  After consent was obtained, using sterile technique the knee was prepped with alcohol.  The joint was entered and Kenalog 80 mg and 5 ml plain Lidocaine was then injected and the needle withdrawn.  The procedure was well tolerated.   The patient is asked to continue to rest the joint for a few more days before resuming regular activities.  It may be more painful for the first 1-2 days.  Watch for fever, or increased swelling or persistent pain in the joint. Call or return to clinic prn if such symptoms occur or there is failure to improve as anticipated. 

## 2021-08-04 NOTE — Progress Notes (Signed)
? ?Subjective:  ?Patient ID: Monique Kelly, female    DOB: 1956/01/01  Age: 66 y.o. MRN: [id number] ? ?Chief Complaint  ?Patient presents with  ? Knee Pain  ? ?HPI ?Patient is a 66 year old white female with past medical history of atrial fibrillation (S/P PVID ablaton in 08/2015), OSA, hyperlipidemia, peripheral neuropathy, breast cancer, B12 deficiency, iron deficiency anemia, hypothyroidism, who had a fall 2 falls 1 to 2 weeks ago. Returns today for kenalog knee injection because has continued to have persistent left knee pain. Xrays were done and were normal. Patient mentioned to my nurse some chest discomfort yesterday with minimal activity. Felt like palpitations. Her bp was noted to be low for her at 88/48. Denies diarrhea, nausea, vomiting, black stools. Denies dizziness. Not drinking enough water.  ? ?Orthostatics: ?Laying: 80/60, p56 ?Sitting: 84/56, p 71 ?Standing: 78/40, p 80. ? ?Cardiac work up: ?CTA Coronaries (June 02, 2021) ?IMPRESSION: ?1. Coronary calcium score of 296. This was 93rd percentile for age, ?sex, and race matched control. ?2. Normal coronary origin with right dominance. ?3. CAD-RADS 3. Moderate stenosis. Consider symptom-guided anti-ischemic pharmacotherapy as well as risk factor modification per guideline directed care. ?  ?EXAM: ?CT-FFR ANALYSIS ?  ?CLINICAL DATA:  Possibly obstructive coronary lesion (left main). ?  ?FINDINGS: ?CT-FFR analysis was performed on the original cardiac CT angiogram ?dataset. Diagrammatic representation of the CT-FFR analysis is ?provided in a separate PDF document in PACS. This dictation was ?created using the PDF document and an interactive 3D model of the ?results. 3D model is not available in the EMR/PACS. Normal FFR range ?is >0.80. ?  ?1. Left Main: No significant functional stenosis, CT-FFR 0.98 at ?lesion. ?  ?2. LAD: No significant functional stenosis, CT-FFR 0.96 at lesion. ?3. LCX: No significant functional stenosis, CT-FFR 0.97 at  lesion. ?4. RCA: Unable to model due to artifact but no associated lesion. ?  ?IMPRESSION: ?1. CT FFR analysis shows no evidence of significant functional ?stenosis. ? ?Patient has a pacemaker and a holter monitor was done ? ?Holter monitor (05/25/2021): persistent atrial fibrillation.  ? ?Current Outpatient Medications on File Prior to Visit  ?Medication Sig Dispense Refill  ? albuterol (VENTOLIN HFA) 108 (90 Base) MCG/ACT inhaler Inhale into the lungs as needed for wheezing or shortness of breath.    ? buPROPion (WELLBUTRIN XL) 300 MG 24 hr tablet Take 300 mg by mouth daily.    ? calcium carbonate (OS-CAL - DOSED IN MG OF ELEMENTAL CALCIUM) 1250 (500 Ca) MG tablet Take 1 tablet by mouth daily with breakfast.    ? cyanocobalamin 1000 MCG tablet Take by mouth.    ? ergocalciferol (VITAMIN D2) 1.25 MG (50000 UT) capsule Take by mouth.    ? Evolocumab (REPATHA SURECLICK) 725 MG/ML SOAJ Inject 1 pen into the skin every 14 (fourteen) days. 2 mL 11  ? gabapentin (NEURONTIN) 300 MG capsule 3 in am and 1 at night. 360 capsule 1  ? letrozole (FEMARA) 2.5 MG tablet Take by mouth.    ? levothyroxine (SYNTHROID) 75 MCG tablet Take 75 mcg by mouth daily.     ? metoprolol succinate (TOPROL XL) 25 MG 24 hr tablet Take 1 tablet (25 mg total) by mouth daily. 90 tablet 1  ? naproxen (NAPROSYN) 500 MG tablet Take 1 tablet (500 mg total) by mouth 2 (two) times daily with a meal. 28 tablet 0  ? omeprazole (PRILOSEC) 40 MG capsule Take 1 capsule (40 mg total) by mouth in the morning and at bedtime.  180 capsule 3  ? Zoledronic Acid (RECLAST IV) Inject into the vein.    ? ?No current facility-administered medications on file prior to visit.  ? ?Past Medical History:  ?Diagnosis Date  ? Atrial fibrillation ([place]) 2016  ? Sees Dr. [name]. Cardiac ablation, loop recorder (removed in 2022)  ? Atrial fibrillation with rapid ventricular response ([place]) 04/2015  ? Breast cancer ([place])   ? right lumpectomy  ? BRONCHITIS 03/10/2009  ? Qualifier:  Diagnosis of  By: Geoffry Paradise MD, Kristine Royal   ? Chest pain with moderate risk for cardiac etiology 04/28/2015  ? Dehydration 06/18/2009  ? Qualifier: Diagnosis of  By: Pete Pelt    ? Depression   ? Family history of breast cancer   ? Family history of colon cancer   ? Fibromyalgia 2010  ? Hyperlipidemia   ? Hypothyroidism (acquired) 2006  ? s/p thyroidectomy for nodules  ? Morbid obesity ([place]) 08/10/2015  ? Obesity   ? gastric bpg 2011, weight then 400 lbs, lost 100 lbs in 60 days.  ? Obstructive sleep apnea   ? does not use cpap, does not have a following physician  ? Paroxysmal atrial fibrillation ([place]) 08/10/2015  ? ?Past Surgical History:  ?Procedure Laterality Date  ? ABDOMINAL HYSTERECTOMY  1992  ? total.   ? APPENDECTOMY    ? BREAST SURGERY  2019  ? right lumpectomy   ? CHOLECYSTECTOMY    ? COLONOSCOPY    ? ELECTROPHYSIOLOGIC STUDY N/A 09/08/2015  ? Procedure: Atrial Fibrillation Ablation;  Surgeon: [name], MD;  Location: [facility];  Service: Cardiovascular;  Laterality: N/A;  ? EP IMPLANTABLE DEVICE N/A 12/17/2015  ? Procedure: Loop Recorder Insertion;  Surgeon: [name], MD;  Location: [facility];  Service: Cardiovascular;  Laterality: N/A;  ? ESOPHAGOGASTRODUODENOSCOPY    ? EXPLORATORY LAPAROTOMY WITH ABDOMINAL MASS EXCISION  2018  ? GASTRIC BYPASS  2011  ? lost 100 lbs, but gained some back.  ? implantable loop recorder removal  03/25/2020  ? removed 2022  ? THYROIDECTOMY  2006  ? for nodules ( hot and cold nodules) Goiter compressing trachea.  ?  ?Family History  ?Problem Relation Age of Onset  ? Heart failure Father   ? Cancer Father   ?     colon  ? Heart failure Maternal Grandmother   ? Heart failure Maternal Grandfather   ? Cancer Mother   ?     breast  ? ?Social History  ? ?Socioeconomic History  ? Marital status: Single  ?  Spouse name: Not on file  ? Number of children: Not on file  ? Years of education: Not on file  ? Highest education level: Not on file  ?Occupational  History  ? Occupation: Armed forces training and education officer  ?Tobacco Use  ? Smoking status: Never  ? Smokeless tobacco: Never  ?Vaping Use  ? Vaping Use: Never used  ?Substance and Sexual Activity  ? Alcohol use: No  ?  Alcohol/week: 0.0 standard drinks  ?  Comment: rare glass of wine  ? Drug use: No  ? Sexual activity: Not on file  ?Other Topics Concern  ? Not on file  ?Social History Narrative  ? Pt lives in [place] alone. 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 ?Social Determinants of Health  ? ?Financial Resource Strain: Not on file  ?Food Insecurity: Not on file  ?Transportation Needs: Not on file  ?Physical Activity: Not on  file  ?Stress: Not on file  ?Social Connections: Not on file  ? ? ?Review of Systems  ?Constitutional:  Positive for chills and fatigue.  ?Respiratory:  Positive for shortness of breath.   ?Cardiovascular:  Positive for chest pain and palpitations.  ?Musculoskeletal:  Positive for arthralgias (left knee pain).  ?Neurological:  Positive for weakness.  ?Psychiatric/Behavioral:  Positive for dysphoric mood.   ? ? ?Objective:  ?BP (!) 88/48   Pulse 70   Temp (!) 96.9 ?F (36.1 ?C)   Resp 16   Ht '5\' 7"'$  (1.702 m)   Wt 262 lb (118.8 kg)   BMI 41.04 kg/m?  ? ?BP/Weight 08/04/2021 07/20/2021 07/05/2021  ?Systolic BP 88 390 300  ?Diastolic BP 48 62 79  ?Wt. (Lbs) 262 268 272  ?BMI 41.04 41.97 42.6  ? ? ?Physical Exam ?Vitals reviewed.  ?Constitutional:   ?   Appearance: Normal appearance. She is normal weight.  ?Neck:  ?   Vascular: No carotid bruit.  ?Cardiovascular:  ?   Rate and Rhythm: Normal rate and regular rhythm.  ?   Heart sounds: Normal heart sounds.  ?Pulmonary:  ?   Effort: Pulmonary effort is normal. No respiratory distress.  ?   Breath sounds: Normal breath sounds.  ?Musculoskeletal:     ?   General: Tenderness (Left medial knee. Tender with mcmurrays. Rt knee is normal.) present.  ?Neurological:  ?   Mental Status: She is alert and oriented to person, place, and time.   ?Psychiatric:     ?   Mood and Affect: Mood normal.     ?   Behavior: Behavior normal.  ? ? ?Diabetic Foot Exam - Simple   ?No data filed ?  ?  ? ?Lab Results  ?Component Value Date  ? WBC 6.0 07/05/2021  ? HGB 10.6 (

## 2021-08-05 ENCOUNTER — Telehealth: Payer: Self-pay | Admitting: Internal Medicine

## 2021-08-05 NOTE — Telephone Encounter (Signed)
Patient returning call per mychart message ?

## 2021-08-05 NOTE — Telephone Encounter (Signed)
Left a message to call back.

## 2021-08-05 NOTE — Telephone Encounter (Signed)
Called patient back. Patient someone already called her back and made her appointment for nurse visit. ?

## 2021-08-09 ENCOUNTER — Ambulatory Visit: Payer: 59 | Admitting: Podiatry

## 2021-08-10 ENCOUNTER — Other Ambulatory Visit: Payer: Self-pay

## 2021-08-10 ENCOUNTER — Ambulatory Visit (INDEPENDENT_AMBULATORY_CARE_PROVIDER_SITE_OTHER): Payer: 59 | Admitting: *Deleted

## 2021-08-10 VITALS — BP 136/86 | HR 76 | Ht 67.0 in | Wt 267.2 lb

## 2021-08-10 DIAGNOSIS — I9589 Other hypotension: Secondary | ICD-10-CM | POA: Diagnosis not present

## 2021-08-10 NOTE — Progress Notes (Signed)
? ?  Nurse Visit  ?  ?Date of Encounter: 08/10/2021 ?ID: Monique Kelly, DOB 04/13/1956, MRN [id number] ? ?PCP:  Rochel Brome, MD ?  ?Cardiologist:  None [place] ?Advanced Practice Provider:  No care team member to display ?Electrophysiologist:  [name], MD  ?EP - Advanced Practice Provider:  Tommye Standard, OB:[id number]} ? ? ?Visit Details  ? ?VS:  BP (!) 140/91   Pulse 76   Ht '5\' 7"'$  (1.702 m)   Wt 267 lb 3.2 oz (121.2 kg)   BMI 41.85 kg/m?  , BMI Body mass index is 41.85 kg/m?. ? ?Wt Readings from Last 3 Encounters:  ?08/10/21 267 lb 3.2 oz (121.2 kg)  ?08/04/21 262 lb (118.8 kg)  ?07/20/21 268 lb (121.6 kg)  ?  ? ?Reason for visit: BP check ?Performed today: Vitals, Provider consulted:[name] Nikiski, [place], and Education ?Changes (medications, testing, etc.) : none ?Length of Visit: 30 minutes ? ?Medications Adjustments/Labs and Tests Ordered: ?No orders of the defined types were placed in this encounter. ? ?No orders of the defined types were placed in this encounter. ? ? ?Signed, ?Stanton Kidney, RN  ?08/10/2021 2:48 PM ? ? ? ? ?

## 2021-08-11 ENCOUNTER — Other Ambulatory Visit: Payer: Self-pay | Admitting: Family Medicine

## 2021-08-11 DIAGNOSIS — M5416 Radiculopathy, lumbar region: Secondary | ICD-10-CM

## 2021-08-17 ENCOUNTER — Other Ambulatory Visit: Payer: Self-pay | Admitting: Physician Assistant

## 2021-08-17 DIAGNOSIS — S8002XA Contusion of left knee, initial encounter: Secondary | ICD-10-CM

## 2021-09-01 ENCOUNTER — Ambulatory Visit: Payer: 59 | Admitting: Legal Medicine

## 2021-09-02 ENCOUNTER — Encounter: Payer: Self-pay | Admitting: Legal Medicine

## 2021-09-02 ENCOUNTER — Ambulatory Visit (INDEPENDENT_AMBULATORY_CARE_PROVIDER_SITE_OTHER): Payer: 59 | Admitting: Legal Medicine

## 2021-09-02 VITALS — BP 110/80 | HR 94 | Temp 98.1°F | Resp 15 | Ht 67.0 in | Wt 261.0 lb

## 2021-09-02 DIAGNOSIS — B359 Dermatophytosis, unspecified: Secondary | ICD-10-CM

## 2021-09-02 NOTE — Progress Notes (Signed)
? ?Acute Office Visit ? ?Subjective:  ? ? Patient ID: Monique Kelly, female    DOB: 12/26/1955, 66 y.o.   MRN: [id number] ? ?Chief Complaint  ?Patient presents with  ? Nail Problem  ? ? ?HPI: ?Patient is in today for thickness and yellowish on big toes on both feet since 6 months ago and also on right hand 2nd and 3rd fingernail. She denied any pain, or discharge. She has tried multiple OTC treatments.this involves right thumb, index and middle finger. Breast chemotherapy stopped 5 years ago. ? ?Past Medical History:  ?Diagnosis Date  ? Atrial fibrillation ([place]) 2016  ? Sees Dr. [name]. Cardiac ablation, loop recorder (removed in 2022)  ? Atrial fibrillation with rapid ventricular response ([place]) 04/2015  ? Breast cancer ([place])   ? right lumpectomy  ? BRONCHITIS 03/10/2009  ? Qualifier: Diagnosis of  By: Geoffry Paradise MD, Kristine Royal   ? Chest pain with moderate risk for cardiac etiology 04/28/2015  ? Dehydration 06/18/2009  ? Qualifier: Diagnosis of  By: Pete Pelt    ? Depression   ? Family history of breast cancer   ? Family history of colon cancer   ? Fibromyalgia 2010  ? Hyperlipidemia   ? Hypothyroidism (acquired) 2006  ? s/p thyroidectomy for nodules  ? Morbid obesity ([place]) 08/10/2015  ? Obesity   ? gastric bpg 2011, weight then 400 lbs, lost 100 lbs in 60 days.  ? Obstructive sleep apnea   ? does not use cpap, does not have a following physician  ? Paroxysmal atrial fibrillation ([place]) 08/10/2015  ? ? ?Past Surgical History:  ?Procedure Laterality Date  ? ABDOMINAL HYSTERECTOMY  1992  ? total.   ? APPENDECTOMY    ? BREAST SURGERY  2019  ? right lumpectomy   ? CHOLECYSTECTOMY    ? COLONOSCOPY    ? ELECTROPHYSIOLOGIC STUDY N/A 09/08/2015  ? Procedure: Atrial Fibrillation Ablation;  Surgeon: [name], MD;  Location: [facility];  Service: Cardiovascular;  Laterality: N/A;  ? EP IMPLANTABLE DEVICE N/A 12/17/2015  ? Procedure: Loop Recorder Insertion;  Surgeon: [name], MD;  Location: [facility];  Service: Cardiovascular;  Laterality: N/A;  ? ESOPHAGOGASTRODUODENOSCOPY    ? EXPLORATORY LAPAROTOMY WITH ABDOMINAL MASS EXCISION  2018  ? GASTRIC BYPASS  2011  ? lost 100 lbs, but gained some back.  ? implantable loop recorder removal  03/25/2020  ? removed 2022  ? THYROIDECTOMY  2006  ? for nodules ( hot and cold nodules) Goiter compressing trachea.  ? ? ?Family History  ?Problem Relation Age of Onset  ? Heart failure Father   ? Cancer Father   ?     colon  ? Heart failure Maternal Grandmother   ? Heart failure Maternal Grandfather   ? Cancer Mother   ?     breast  ? ? ?Social History  ? ?Socioeconomic History  ? Marital status: Single  ?  Spouse name: Not on file  ? Number of children: Not on file  ? Years of education: Not on file  ? Highest education level: Not on file  ?Occupational History  ? Occupation: Armed forces training and education officer  ?Tobacco Use  ? Smoking status: Never  ? Smokeless tobacco: Never  ?Vaping Use  ? Vaping Use: Never used  ?Substance and Sexual Activity  ? Alcohol use: Yes  ?  Alcohol/week: 1.0 standard drink  ?  Types: 1 Standard drinks or equivalent per week  ?  Comment:  rare glass of wine  ? Drug use: No  ? Sexual activity: Not Currently  ?Other Topics Concern  ? Not on file  ?Social History Narrative  ? Pt lives in [place] alone. 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 ?Social Determinants of Health  ? ?Financial Resource Strain: Not on file  ?Food Insecurity: Not on file  ?Transportation Needs: Not on file  ?Physical Activity: Not on file  ?Stress: Not on file  ?Social Connections: Not on file  ?Intimate Partner Violence: Not on file  ? ? ?Outpatient Medications Prior to Visit  ?Medication Sig Dispense Refill  ? albuterol (VENTOLIN HFA) 108 (90 Base) MCG/ACT inhaler Inhale into the lungs as needed for wheezing or shortness of breath.    ? buPROPion (WELLBUTRIN XL) 150 MG 24 hr tablet Take 150 mg by mouth daily.    ? buPROPion (WELLBUTRIN XL) 300 MG 24 hr tablet  Take 300 mg by mouth daily.    ? calcium carbonate (OS-CAL - DOSED IN MG OF ELEMENTAL CALCIUM) 1250 (500 Ca) MG tablet Take 1 tablet by mouth daily with breakfast.    ? cyanocobalamin 1000 MCG tablet Take by mouth.    ? ergocalciferol (VITAMIN D2) 1.25 MG (50000 UT) capsule Take by mouth.    ? Evolocumab (REPATHA SURECLICK) 381 MG/ML SOAJ Inject 1 pen into the skin every 14 (fourteen) days. 2 mL 11  ? gabapentin (NEURONTIN) 300 MG capsule TAKE 3 CAPSULES in IN THE MORNING and 1 at night. 360 capsule 2  ? letrozole (FEMARA) 2.5 MG tablet Take by mouth.    ? levothyroxine (SYNTHROID) 75 MCG tablet Take 75 mcg by mouth daily.     ? metoprolol succinate (TOPROL XL) 25 MG 24 hr tablet Take 1 tablet (25 mg total) by mouth daily. 90 tablet 1  ? naproxen (NAPROSYN) 500 MG tablet Take 1 tablet (500 mg total) by mouth 2 (two) times daily with a meal. 28 tablet 0  ? omeprazole (PRILOSEC) 40 MG capsule Take 1 capsule (40 mg total) by mouth in the morning and at bedtime. 180 capsule 3  ? Zoledronic Acid (RECLAST IV) Inject into the vein.    ? ?Facility-Administered Medications Prior to Visit  ?Medication Dose Route Frequency Provider Last Rate Last Admin  ? triamcinolone acetonide (KENALOG-40) injection 80 mg  80 mg Intra-articular Once Rochel Brome, MD      ? ? ?Allergies  ?Allergen Reactions  ? Nitroglycerin   ?  Hypotension, difficulty maintaining conciousness ?Hypotension, difficulty maintaining conciousness ?Hypotension, difficulty maintaining conciousness  ? ? ?Review of Systems  ?Constitutional:  Negative for chills, fatigue and fever.  ?HENT:  Negative for congestion, ear pain and sore throat.   ?Respiratory:  Negative for cough and shortness of breath.   ?Cardiovascular:  Negative for chest pain and palpitations.  ?Gastrointestinal:  Negative for abdominal pain, constipation, diarrhea, nausea and vomiting.  ?Endocrine: Negative for polydipsia, polyphagia and polyuria.  ?Genitourinary:  Negative for difficulty urinating  and dysuria.  ?Musculoskeletal:  Negative for arthralgias, back pain and myalgias.  ?Skin:  Negative for rash.  ?     First toes on both feet are yellowish and thick . ?2nd and 3rd fingernail on right hand are thick and yellowish.  ?Neurological:  Negative for headaches.  ?Psychiatric/Behavioral:  Negative for dysphoric mood. The patient is not nervous/anxious.   ? ?   ?Objective:  ?  ?Physical Exam ?Vitals reviewed.  ?Constitutional:   ?   Appearance:  Normal appearance. She is obese.  ?HENT:  ?   Right Ear: Tympanic membrane normal.  ?   Left Ear: Tympanic membrane normal.  ?Cardiovascular:  ?   Rate and Rhythm: Normal rate and regular rhythm.  ?   Pulses: Normal pulses.  ?   Heart sounds: Normal heart sounds. No murmur heard. ?  No gallop.  ?Pulmonary:  ?   Effort: Pulmonary effort is normal. No respiratory distress.  ?   Breath sounds: Normal breath sounds. No wheezing.  ?Musculoskeletal:     ?   General: Normal range of motion.  ?Skin: ?   General: Skin is warm.  ?   Comments: Right hand nails show scaling and cracking right thumb, index and middle finger.  There is involvement of nail fold.  It psoriatic type look.  ?Neurological:  ?   General: No focal deficit present.  ?   Mental Status: She is alert.  ? ? ?BP 110/80   Pulse 94   Temp 98.1 ?F (36.7 ?C)   Resp 15   Ht '5\' 7"'$  (1.702 m)   Wt 261 lb (118.4 kg)   SpO2 95%   BMI 40.88 kg/m?  ?Wt Readings from Last 3 Encounters:  ?09/02/21 261 lb (118.4 kg)  ?08/10/21 267 lb 3.2 oz (121.2 kg)  ?08/04/21 262 lb (118.8 kg)  ? ? ?Health Maintenance Due  ?Topic Date Due  ? Zoster Vaccines- Shingrix (1 of 2) Never done  ? PAP SMEAR-Modifier  Never done  ? COVID-19 Vaccine (3 - Booster for Janssen series) 05/14/2021  ? ? ?There are no preventive care reminders to display for this patient. ? ? ?Lab Results  ?Component Value Date  ? TSH 1.581 01/20/2021  ? ?Lab Results  ?Component Value Date  ? WBC 6.0 07/05/2021  ? HGB 10.6 (A) 07/05/2021  ? HCT 33 (A) 07/05/2021  ?  MCV 93 02/02/2021  ? PLT 223 07/05/2021  ? ?Lab Results  ?Component Value Date  ? NA 140 07/05/2021  ? K 4.4 07/05/2021  ? CO2 31 (A) 07/05/2021  ? GLUCOSE 90 05/18/2021  ? BUN 15 07/05/2021  ? CREATININE 0.

## 2021-09-06 NOTE — Progress Notes (Signed)
? ?Cardiology Office Note ?Date:  09/06/2021  ?Patient ID:  Monique, Kelly 1955-09-29, MRN [id number] ?PCP:  Rochel Brome, MD  ?Cardiologist/Electrophysiologist: Dr. Rayann Heman ? ? ? ?Chief Complaint:   planned f/u ? ?History of Present Illness: ?Monique Kelly is a 66 y.o. female with history of hypothyroidism (s/p thyroidectomy 2/2 nodules), HLD, OSA (uses dental appliance), obesity w/hx of gastric bypass, breast cancer, and AFib, fibromyalgia ? ?She has had a couple years of waxing/waning though fairly chronic c/o SOB, CP ? ?I saw her 07/31/19 ?Unfortunately she still remains very fatigued, no energy.  Ever since her COVID infection she has never felt well again (she tested + on 06/24/2019) ?She remains extremely tired, fatigued all day, and with minimal exertion like showering, making the bed she gets very SOB and has to sit. ?She has not had the same CP/symptoms as she was having before COVID, but when she gets more SOB feels like her chest is heavy ?She was concerned about the finding of coronary calcifications on her CT and wanted to come in. ? ?She states the day she went to the ER at [facility], she was woken feeling very SOB, felt like her heart was fast and felt clammy, she tried to relax, and eventually felt better.  She that morning got up, ready for work, and on the drive in again became SOB, would not settle dowm had heaviness in her chest and went to the ER. ?She says the labs, EKG all done with active symptoms. ?She does think that the new inhaler is more helpful then the one Rx at [facility]. ?No dizzy spells, no reports of near syncope or syncope. ?Unfortunately her job despite the note from the ER MD will not let her work from home. ?She is having trouble getting in to see her PMD. ?She mentions again today that she does tend to worry and let her mind get the better of her at times ?Recommended f/u with her PMD for work issues/notes ?Urged to revisit dental appliance when she could for her apnea ?CP  not suspect to be coronary, planned to update her echo. ? ?Saw ENT 03/2020 for her sleep apnea, looks like plan was to revisit PAP therapy ? ?She saw D. Allred as well 11/201, ILR was EOS and removed ? ?MOST recently for EP saw A. Tillery, PA-C 02/08/21 2/2 to an abnormal EKG, she reported some occasional palpitations, Ekg that prompted her visit he suspected artifact with low voltage on the machine rather then true missing P waves ?Planned to have her wear a monitor if increased palpitations ?Referred to the lipid clinic for HLD, having failed statins historically ? ?Patient via my chart messaged increased burden of palpitations and given an appt to come in ? ?I saw her Dec 2022 ?She is feeling well today ?Reports that about 2 weeks ago for about 5 days she felt fairly terrible with palpitations that felt disctinctly different then her AFib used to, these associated with dizziness with the feeling of being off balance, SOB and had R ear pain> her jaw and some chest dicomfort ?These always occurred together, more typically in the evenings and could last 10 minutes to 5-6 hours in duration. ?Then they were just gone for a couple days, then back again for a day or so and now has been resolved. ?No near syncope or syncope ?Suspected non-cardiac etiology though planned for Coronary CT and event monitor ? 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 ? ?TODAY ?Generally doing OK ?Has some palpitations still, mostly brief flutter, though sometimes are more persistent, can last a couple hours, less often longer then that, rarely >24 hours ?No CP ?No near syncope or syncope. ?No rest SOB but has noticed that it takes less for her to feel SOB in the last few months, having to pace herself doing household chores. ? ?When she saw Dr. Tobie Poet with low BP she confirms she was not symptomatic,  unaware that her BP was so low. ?Her BP at the ER at least from the notes I see were 115/62, 134/73  ?She did get a L of IVF and was found to have a UTI and given Rocephin ?Record reports after fluids BP 121/62 ?She remained asymptomatic through her stay ?She was offered/advised to be admitted to evaluate her hypotension found at her PMD office but she preferred to leave ? ?Her BPs at home have ben "in the normal range" a few here and there higher since then, OFF Toprol ? ?Device information: ?MDT ILR, implanted 12/17/15 >> EOS >> explanted Nov 2021 ? ?AF HX ?PVI ablation, Dr. Rayann Heman 09/08/2015 ?Flecainide stopped Aug 2017 ? ?Past Medical History:  ?Diagnosis Date  ? Atrial fibrillation ([place]) 2016  ? Sees Dr. [name]. Cardiac ablation, loop recorder (removed in 2022)  ? Atrial fibrillation with rapid ventricular response ([place]) 04/2015  ? Breast cancer ([place])   ? right lumpectomy  ? BRONCHITIS 03/10/2009  ? Qualifier: Diagnosis of  By: Geoffry Paradise MD, Kristine Royal   ? Chest pain with moderate risk for cardiac etiology 04/28/2015  ? Dehydration 06/18/2009  ? Qualifier: Diagnosis of  By: Pete Pelt    ? Depression   ? Family history of breast cancer   ? Family history of colon cancer   ? Fibromyalgia 2010  ? Hyperlipidemia   ? Hypothyroidism (acquired) 2006  ? s/p thyroidectomy for nodules  ? Morbid obesity ([place]) 08/10/2015  ? Obesity   ? gastric bpg 2011, weight then 400 lbs, lost 100 lbs in 60 days.  ? Obstructive sleep apnea   ? does not use cpap, does not have a following physician  ? Paroxysmal atrial fibrillation ([place]) 08/10/2015  ? ? ?Past Surgical History:  ?Procedure Laterality Date  ? ABDOMINAL HYSTERECTOMY  1992  ? total.   ? APPENDECTOMY    ? BREAST SURGERY  2019  ? right lumpectomy   ? CHOLECYSTECTOMY    ? COLONOSCOPY    ? ELECTROPHYSIOLOGIC STUDY N/A 09/08/2015  ? Procedure: Atrial Fibrillation Ablation;  Surgeon: [name], MD;  Location: [facility];  Service: Cardiovascular;  Laterality: N/A;  ?  EP IMPLANTABLE DEVICE N/A 12/17/2015  ? Procedure: Loop Recorder Insertion;  Surgeon: [name], MD;  Location: [facility];  Service: Cardiovascular;  Laterality: N/A;  ? ESOPHAGOGASTRODUODENOSCOPY    ? EXPLORATORY LAPAROTOMY WITH ABDOMINAL MASS EXCISION  2018  ? GASTRIC BYPASS  2011  ? lost 100 lbs, but gained some back.  ? implantable loop recorder removal  03/25/2020  ? removed 2022  ? THYROIDECTOMY  2006  ? for nodules ( hot and cold nodules) Goiter compressing trachea.  ? ? ?Current Outpatient Medications  ?Medication Sig Dispense Refill  ? albuterol (VENTOLIN HFA) 108 (90 Base) MCG/ACT inhaler Inhale into the lungs as needed for wheezing or shortness of breath.    ? buPROPion (WELLBUTRIN XL) 150 MG 24 hr tablet  Take 150 mg by mouth daily.    ? buPROPion (WELLBUTRIN XL) 300 MG 24 hr tablet Take 300 mg by mouth daily.    ? calcium carbonate (OS-CAL - DOSED IN MG OF ELEMENTAL CALCIUM) 1250 (500 Ca) MG tablet Take 1 tablet by mouth daily with breakfast.    ? cyanocobalamin 1000 MCG tablet Take by mouth.    ? ergocalciferol (VITAMIN D2) 1.25 MG (50000 UT) capsule Take by mouth.    ? Evolocumab (REPATHA SURECLICK) 086 MG/ML SOAJ Inject 1 pen into the skin every 14 (fourteen) days. 2 mL 11  ? gabapentin (NEURONTIN) 300 MG capsule TAKE 3 CAPSULES in IN THE MORNING and 1 at night. 360 capsule 2  ? letrozole (FEMARA) 2.5 MG tablet Take by mouth.    ? levothyroxine (SYNTHROID) 75 MCG tablet Take 75 mcg by mouth daily.     ? metoprolol succinate (TOPROL XL) 25 MG 24 hr tablet Take 1 tablet (25 mg total) by mouth daily. 90 tablet 1  ? naproxen (NAPROSYN) 500 MG tablet Take 1 tablet (500 mg total) by mouth 2 (two) times daily with a meal. 28 tablet 0  ? omeprazole (PRILOSEC) 40 MG capsule Take 1 capsule (40 mg total) by mouth in the morning and at bedtime. 180 capsule 3  ? Zoledronic Acid (RECLAST IV) Inject into the vein.    ? ?Current Facility-Administered Medications  ?Medication Dose Route Frequency Provider  Last Rate Last Admin  ? triamcinolone acetonide (KENALOG-40) injection 80 mg  80 mg Intra-articular Once Rochel Brome, MD      ? ? ?Allergies:   Nitroglycerin  ? ?Social History:  The patient  reports that she

## 2021-09-08 ENCOUNTER — Encounter: Payer: Self-pay | Admitting: Physician Assistant

## 2021-09-08 ENCOUNTER — Ambulatory Visit (INDEPENDENT_AMBULATORY_CARE_PROVIDER_SITE_OTHER): Payer: 59 | Admitting: Physician Assistant

## 2021-09-08 VITALS — BP 118/80 | HR 84 | Ht 67.0 in | Wt 263.0 lb

## 2021-09-08 DIAGNOSIS — I480 Paroxysmal atrial fibrillation: Secondary | ICD-10-CM

## 2021-09-08 DIAGNOSIS — R0602 Shortness of breath: Secondary | ICD-10-CM | POA: Diagnosis not present

## 2021-09-08 MED ORDER — METOPROLOL TARTRATE 25 MG PO TABS: 25.0000 mg | ORAL_TABLET | Freq: Every day | ORAL | 0 refills | Status: DC | PRN

## 2021-09-08 NOTE — Patient Instructions (Addendum)
Medication Instructions:  ? ? ?START TAKING:  LOPRESSOR 25 MG AS NEEDED FOR PALPITATIONS OR AFIB  ? ?STOP TAKING AND REMOVE THIS MEDICATION FROM YOUR MEDICATION LIST: TOPROL XL 25 MG  ? ? ?*If you need a refill on your cardiac medications before your next appointment, please call your pharmacy* ? ? ?Lab Work: [place] ? ? ?If you have labs (blood work) drawn today and your tests are completely normal, you will receive your results only by: ?MyChart Message (if you have MyChart) OR ?A paper copy in the mail ?If you have any lab test that is abnormal or we need to change your treatment, we will call you to review the results. ? ? ?Testing/Procedures: Your physician has requested that you have an echocardiogram. Echocardiography is a painless test that uses sound waves to create images of your heart. It provides your doctor with information about the size and shape of your heart and how well your heart?s chambers and valves are working. This procedure takes approximately one hour. There are no restrictions for this procedure. ? ? ? ?Follow-Up: ?At [facility], you and your health needs are our priority.  As part of our continuing mission to provide you with exceptional heart care, we have created designated Provider Care Teams.  These Care Teams include your primary Cardiologist (physician) and Advanced Practice Providers (APPs -  Physician Assistants and Nurse Practitioners) who all work together to provide you with the care you need, when you need it. ? ?We recommend signing up for the patient portal called "MyChart".  Sign up information is provided on this After Visit Summary.  MyChart is used to connect with patients for Virtual Visits (Telemedicine).  Patients are able to view lab/test results, encounter notes, upcoming appointments, etc.  Non-urgent messages can be sent to your provider as well.   ?To learn more about what you can do with MyChart, go to [email or web address].   ? ?Your next  appointment:   ? ?3 -4 month(s) IN [place] ( CONTACT [facility] FOR EP SCHEDULING ISSUES ) ?  ? ?The format for your next appointment:   ?In Person ? ?Provider:   ?Allegra Lai, MD  ? ? ?Other Instructions ? ? ?Important Information About [place] ? ? ? ? ?  ?

## 2021-09-09 ENCOUNTER — Other Ambulatory Visit: Payer: Self-pay | Admitting: Pulmonary Disease

## 2021-09-09 ENCOUNTER — Other Ambulatory Visit: Payer: Self-pay | Admitting: Physician Assistant

## 2021-09-09 DIAGNOSIS — S8002XA Contusion of left knee, initial encounter: Secondary | ICD-10-CM

## 2021-09-23 ENCOUNTER — Ambulatory Visit (INDEPENDENT_AMBULATORY_CARE_PROVIDER_SITE_OTHER): Payer: PPO

## 2021-09-23 ENCOUNTER — Encounter: Payer: Self-pay | Admitting: Hematology and Oncology

## 2021-09-23 DIAGNOSIS — R0602 Shortness of breath: Secondary | ICD-10-CM

## 2021-09-23 LAB — ECHOCARDIOGRAM COMPLETE
Area-P 1/2: 4.63 cm2
S' Lateral: 3.4 cm

## 2021-09-27 ENCOUNTER — Encounter: Payer: Self-pay | Admitting: Hematology and Oncology

## 2021-09-27 ENCOUNTER — Other Ambulatory Visit: Payer: Self-pay | Admitting: Hematology and Oncology

## 2021-09-27 DIAGNOSIS — C50411 Malignant neoplasm of upper-outer quadrant of right female breast: Secondary | ICD-10-CM

## 2021-09-28 ENCOUNTER — Telehealth: Payer: Self-pay | Admitting: *Deleted

## 2021-09-28 ENCOUNTER — Encounter: Payer: Self-pay | Admitting: Hematology and Oncology

## 2021-09-28 NOTE — Telephone Encounter (Signed)
Spoke with patient aware of results and verbalized understanding. Patient is staying encourage to be active daily. ?

## 2021-09-28 NOTE — Telephone Encounter (Signed)
-----   Message from [place], [place] sent at 09/24/2021 10:41 AM EDT ----- ?Heart is strong, no heart murmurs of any concern.  Encourage walking/exercise to her capacity ?

## 2021-10-04 ENCOUNTER — Ambulatory Visit: Payer: PPO | Admitting: Podiatry

## 2021-10-04 DIAGNOSIS — B351 Tinea unguium: Secondary | ICD-10-CM | POA: Diagnosis not present

## 2021-10-04 NOTE — Progress Notes (Signed)
? ?  Subjective: ?66 y.o. female presenting today as a new patient for evaluation of discoloration with thickening to the bilateral great toes.  The right great toenail plate has been like this for several years however the left recently started about 8 months ago.  She has tried different OTC topicals with no significant improvement.  She presents for further treatment and evaluation ? ?Past Medical History:  ?Diagnosis Date  ? Atrial fibrillation ([place]) 2016  ? Sees Dr. [name]. Cardiac ablation, loop recorder (removed in 2022)  ? Atrial fibrillation with rapid ventricular response ([place]) 04/2015  ? Breast cancer ([place])   ? right lumpectomy  ? BRONCHITIS 03/10/2009  ? Qualifier: Diagnosis of  By: Geoffry Paradise MD, Kristine Royal   ? Chest pain with moderate risk for cardiac etiology 04/28/2015  ? Dehydration 06/18/2009  ? Qualifier: Diagnosis of  By: Pete Pelt    ? Depression   ? Family history of breast cancer   ? Family history of colon cancer   ? Fibromyalgia 2010  ? Hyperlipidemia   ? Hypothyroidism (acquired) 2006  ? s/p thyroidectomy for nodules  ? Morbid obesity ([place]) 08/10/2015  ? Obesity   ? gastric bpg 2011, weight then 400 lbs, lost 100 lbs in 60 days.  ? Obstructive sleep apnea   ? does not use cpap, does not have a following physician  ? Paroxysmal atrial fibrillation ([place]) 08/10/2015  ? ? ?Objective: ?Physical Exam ?General: The patient is alert and oriented x3 in no acute distress. ? ?Dermatology: Hyperkeratotic, discolored, thickened, onychodystrophy noted to the bilateral great toenail plates. Skin is warm, dry and supple bilateral lower extremities. Negative for open lesions or macerations. ? ?Vascular: Palpable pedal pulses bilaterally. No edema or erythema noted. Capillary refill within normal limits. ? ?Neurological: Epicritic and protective threshold grossly intact bilaterally.  ? ?Musculoskeletal Exam: No pedal deformity noted ? ?Assessment: ?#1 Onychomycosis of toenails bilateral great  toes ? ?Plan of Care:  ?#1 Patient was evaluated. ?#2  Today we discussed different treatment options including oral, topical, and laser antifungal treatment modalities.  We discussed their efficacies and side effects.  Patient opts for a more conservative treatment option which would be topical antifungal treatment modality ?#3  Tolcylen antifungal topical dispensed at checkout ?#4 return to clinic as needed ? ?Edrick Kins, DPM ?[facility] ? ?Dr. Edrick Kins, DPM  ?  ?[address]                                    ?[place], [place] [address]                ?Office [phone]  ?Fax [phone] ? ? ?  ?

## 2021-10-11 ENCOUNTER — Telehealth: Payer: Self-pay

## 2021-10-11 ENCOUNTER — Other Ambulatory Visit: Payer: Self-pay | Admitting: Physician Assistant

## 2021-10-11 DIAGNOSIS — S8002XA Contusion of left knee, initial encounter: Secondary | ICD-10-CM

## 2021-10-11 NOTE — Telephone Encounter (Signed)
Patient called requesting advise stated for the past 2-3 days she has had back pain which today starting radiating to her right groin. Denied having and known injury/fall, sob, nausea, dysuria, urinary urgency or frequency or odor. Reported that when she urinates she does not urinate "a lot". Advised patient that she needed to go to Urgent Care, she stated she has a appt here 6/1. I advised her that she does not need to wait until her appt, and to be seen at Urgent Care. Patient expressed understanding as well that if her symptoms worsened she can also go to the ED.

## 2021-10-11 NOTE — Telephone Encounter (Signed)
Agree 

## 2021-10-21 ENCOUNTER — Ambulatory Visit (INDEPENDENT_AMBULATORY_CARE_PROVIDER_SITE_OTHER): Payer: PPO | Admitting: Family Medicine

## 2021-10-21 ENCOUNTER — Encounter: Payer: Self-pay | Admitting: Family Medicine

## 2021-10-21 VITALS — BP 98/68 | HR 62 | Temp 97.2°F | Ht 67.0 in | Wt 254.0 lb

## 2021-10-21 DIAGNOSIS — C50411 Malignant neoplasm of upper-outer quadrant of right female breast: Secondary | ICD-10-CM

## 2021-10-21 DIAGNOSIS — K219 Gastro-esophageal reflux disease without esophagitis: Secondary | ICD-10-CM | POA: Diagnosis not present

## 2021-10-21 DIAGNOSIS — M797 Fibromyalgia: Secondary | ICD-10-CM | POA: Diagnosis not present

## 2021-10-21 DIAGNOSIS — I4811 Longstanding persistent atrial fibrillation: Secondary | ICD-10-CM | POA: Diagnosis not present

## 2021-10-21 DIAGNOSIS — I700 Atherosclerosis of aorta: Secondary | ICD-10-CM | POA: Diagnosis not present

## 2021-10-21 DIAGNOSIS — F32A Depression, unspecified: Secondary | ICD-10-CM

## 2021-10-21 DIAGNOSIS — Z170 Estrogen receptor positive status [ER+]: Secondary | ICD-10-CM

## 2021-10-21 DIAGNOSIS — M5412 Radiculopathy, cervical region: Secondary | ICD-10-CM | POA: Diagnosis not present

## 2021-10-21 DIAGNOSIS — M5441 Lumbago with sciatica, right side: Secondary | ICD-10-CM | POA: Diagnosis not present

## 2021-10-21 DIAGNOSIS — E782 Mixed hyperlipidemia: Secondary | ICD-10-CM | POA: Diagnosis not present

## 2021-10-21 DIAGNOSIS — E039 Hypothyroidism, unspecified: Secondary | ICD-10-CM | POA: Diagnosis not present

## 2021-10-21 DIAGNOSIS — G4733 Obstructive sleep apnea (adult) (pediatric): Secondary | ICD-10-CM | POA: Diagnosis not present

## 2021-10-21 MED ORDER — PREDNISONE 50 MG PO TABS: 50.0000 mg | ORAL_TABLET | Freq: Every day | ORAL | 0 refills | Status: DC

## 2021-10-21 NOTE — Assessment & Plan Note (Signed)
Continue Metoprolol 25 mg once daily PRN only if needed.

## 2021-10-21 NOTE — Assessment & Plan Note (Signed)
No changes to medicines. Continue Repatha 140 mg injection once every two weeks. Continue to work on eating a healthy diet and exercise.  Labs drawn today.

## 2021-10-21 NOTE — Assessment & Plan Note (Signed)
Start Prednisone 50 mg take 1 tablet daily for 5 days.

## 2021-10-21 NOTE — Assessment & Plan Note (Signed)
Well Controlled. Continue Buproprion '150mg'$  and '300mg'$  making total of 450 mg once daily.

## 2021-10-21 NOTE — Progress Notes (Signed)
Subjective:  Patient ID: Monique Kelly, female    DOB: [birth year]  Age: 66 y.o. MRN: [id number]  Chief Complaint  Patient presents with   Hyperlipidemia   Hypothyroidism   HPI: Hyperlipidemia:  Patient is currently taking Repatha 140 mg injection once every 2 weeks.   Depression:  Patient is currently taking Buproprion '150mg'$  and 300 mg for a total of 450 mg daily.    08/04/2021   11:49 AM 03/19/2021   11:02 AM 02/02/2021    8:11 AM  PHQ9 SCORE ONLY  PHQ-9 Total Score 0 11 12    Long standing persistent AFIB: Patient is currently only taking Metoprolol 25 mg only as needed.  Hypothyroid: Patient is currently taking Levothyroxine  75 mcg once daily.  GERD: Patient is currently taking Omeprazole 40 mg take 1 capsule daily.  Malignant Neoplasm of right breast: Patient is currently still seeing Dr. Ronne Binning at the Cancer center. On letrozole.  Chronic Lumbar and Neck pain: Patient is currently taking Gabapentin 300 mg take 1 capsule BID and Naproxen 500 mg take 1 tablet BID.  Patient had fall this past Sunday, she tripped in her bathroom and fell and hit the left side of her head on the bath tub. She stated that she got the place to stop bleeding on her own but could never find a cut. She states she has had headache ever since the fall but denies nausea, vomiting, or loss of consciousness. Patient does have large bruise on the left side of her head with discoloration of few days old.  Patient also goes on to mention she has been having mid lower lumbar back pain and in the lower thoracic region as well that is worse than normal that is running down the outside of her right leg down to the knee cap. She states that she has tried her naproxen with no relief and sitting up on a heating pad as well. Patient states that she has not tried tylenol.    HPI reviewed and updated.   Current Outpatient Medications on File Prior to Visit  Medication Sig Dispense Refill   albuterol (VENTOLIN  HFA) 108 (90 Base) MCG/ACT inhaler Inhale into the lungs as needed for wheezing or shortness of breath.     buPROPion (WELLBUTRIN XL) 150 MG 24 hr tablet Take 150 mg by mouth daily.     buPROPion (WELLBUTRIN XL) 300 MG 24 hr tablet Take 300 mg by mouth daily.     calcium carbonate (OS-CAL - DOSED IN MG OF ELEMENTAL CALCIUM) 1250 (500 Ca) MG tablet Take 1 tablet by mouth daily with breakfast.     cyanocobalamin 1000 MCG tablet Take by mouth.     ergocalciferol (VITAMIN D2) 1.25 MG (50000 UT) capsule Take by mouth.     Evolocumab (REPATHA SURECLICK) 614 MG/ML SOAJ Inject 1 pen into the skin every 14 (fourteen) days. 2 mL 11   gabapentin (NEURONTIN) 300 MG capsule TAKE 3 CAPSULES in IN THE MORNING and 1 at night. 360 capsule 2   letrozole (FEMARA) 2.5 MG tablet TAKE ONE TABLET BY MOUTH EVERY DAY 90 tablet 3   levothyroxine (SYNTHROID) 75 MCG tablet Take 75 mcg by mouth daily.      metoprolol tartrate (LOPRESSOR) 25 MG tablet Take 1 tablet (25 mg total) by mouth daily as needed (PALPITATION/ AFIB). 90 tablet 0   naproxen (NAPROSYN) 500 MG tablet Take 1 tablet (500 mg total) by mouth 2 (two) times daily with a meal. 28 tablet 0  omeprazole (PRILOSEC) 40 MG capsule Take 1 capsule (40 mg total) by mouth in the morning and at bedtime. 180 capsule 3   Zoledronic Acid (RECLAST IV) Inject into the vein.     Current Facility-Administered Medications on File Prior to Visit  Medication Dose Route Frequency Provider Last Rate Last Admin   triamcinolone acetonide (KENALOG-40) injection 80 mg  80 mg Intra-articular Once Rochel Brome, MD       Past Medical History:  Diagnosis Date   Atrial fibrillation [facility]) 2016   Sees Dr. [name]. Cardiac ablation, loop recorder (removed in 2022)   Atrial fibrillation with rapid ventricular response ([place]) 04/2015   Breast cancer ([place])    right lumpectomy   BRONCHITIS 03/10/2009   Qualifier: Diagnosis of  By: Geoffry Paradise MD, Kristine Royal    Chest pain with moderate risk for  cardiac etiology 04/28/2015   Dehydration 06/18/2009   Qualifier: Diagnosis of  By: Pete Pelt     Depression    Family history of breast cancer    Family history of colon cancer    Fibromyalgia 2010   Hyperlipidemia    Hypothyroidism (acquired) 2006   s/p thyroidectomy for nodules   Morbid obesity ([place]) 08/10/2015   Obesity    gastric bpg 2011, weight then 400 lbs, lost 100 lbs in 60 days.   Obstructive sleep apnea    does not use cpap, does not have a following physician   Paroxysmal atrial fibrillation ([place]) 08/10/2015   Past Surgical History:  Procedure Laterality Date   ABDOMINAL HYSTERECTOMY  1992   total.    APPENDECTOMY     BREAST SURGERY  2019   right lumpectomy    CHOLECYSTECTOMY     COLONOSCOPY     ELECTROPHYSIOLOGIC STUDY N/A 09/08/2015   Procedure: Atrial Fibrillation Ablation;  Surgeon: [name], MD;  Location: [facility];  Service: Cardiovascular;  Laterality: N/A;   EP IMPLANTABLE DEVICE N/A 12/17/2015   Procedure: Loop Recorder Insertion;  Surgeon: [name], MD;  Location: [facility];  Service: Cardiovascular;  Laterality: N/A;   ESOPHAGOGASTRODUODENOSCOPY     EXPLORATORY LAPAROTOMY WITH ABDOMINAL MASS EXCISION  2018   GASTRIC BYPASS  2011   lost 100 lbs, but gained some back.   implantable loop recorder removal  03/25/2020   removed 2022   THYROIDECTOMY  2006   for nodules ( hot and cold nodules) Goiter compressing trachea.    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 Works as Technical brewer for [employer].   Right handed   Social Determinants of Health   Financial Resource Strain: Not on file  Food Insecurity: Not on file  Transportation Needs: Not on file  Physical Activity: Not on file  Stress: Not on file  Social Connections: Not on file    Review of Systems  Constitutional:  Negative for appetite change, fatigue and fever.  HENT:  Negative for congestion, ear pain, sinus pressure and sore throat.   Respiratory:  Negative for cough, chest tightness, shortness of breath and wheezing.   Cardiovascular:  Negative for chest pain and palpitations.  Gastrointestinal:  Negative for abdominal pain, constipation, diarrhea, nausea and vomiting.  Genitourinary:  Negative for dysuria and hematuria.  Musculoskeletal:  Positive for back pain (Low back pain running down right outside of leg.). Negative for arthralgias, joint swelling and myalgias.  Skin:  Negative for rash.  Neurological:  Negative for dizziness, weakness and headaches.  Psychiatric/Behavioral:  Negative for dysphoric mood. The patient is not nervous/anxious.      Objective:  BP 98/68 (BP Location: Left Arm, Patient Position: Sitting)   Pulse 62   Temp (!) 97.2 F (36.2 C) (Temporal)   Ht '5\' 7"'$  (1.702 m)   Wt 254 lb (115.2 kg)   SpO2 92%   BMI 39.78 kg/m      10/21/2021    2:35 PM 09/08/2021    2:27 PM 09/02/2021    2:31 PM  BP/Weight  Systolic BP 98 892 119  Diastolic BP 68 80 80  Wt. (Lbs) 254 263 261  BMI 39.78 kg/m2 41.19 kg/m2 40.88 kg/m2    Physical Exam Vitals  reviewed.  Constitutional:      Appearance: Normal appearance. She is obese.  Cardiovascular:     Rate and Rhythm: Normal rate and regular rhythm.     Heart sounds: Normal heart sounds.  Pulmonary:     Effort: Pulmonary effort is normal.     Breath sounds: Normal breath sounds.  Abdominal:     General: Abdomen is flat. Bowel sounds are normal.     Palpations: Abdomen is soft.  Musculoskeletal:       Back:     Comments: Tenderness in areas marked above. Negative straight leg raise bilaterally.   Neurological:     Mental Status: She is alert and oriented to person, place, and time.     Comments: Patient did have evident laceration on left side of head with deep bruising discolorations of healing.  Psychiatric:        Mood and Affect: Mood normal.        Behavior: Behavior normal.     Diabetic Foot Exam - Simple   No data filed      Lab Results  Component Value Date   WBC 7.0 10/21/2021   HGB 11.1 10/21/2021   HCT 35.2 10/21/2021   PLT 290 10/21/2021   GLUCOSE 84 10/21/2021   CHOL 136 10/21/2021   TRIG 122 10/21/2021   HDL 46 10/21/2021   LDLCALC 68 10/21/2021   ALT 10 10/21/2021   AST 12 10/21/2021   NA 141 10/21/2021   K 4.5 10/21/2021   CL 104 10/21/2021   CREATININE 0.96 10/21/2021   BUN 14 10/21/2021   CO2 24 10/21/2021   TSH 1.581 01/20/2021      Assessment & Plan:   Problem List Items  Addressed This Visit       Cardiovascular and Mediastinum   Aortic atherosclerosis ([place])    Continue repatha.      A-fib ([facility]) - Primary    Continue Metoprolol 25 mg once daily PRN only if needed.        Respiratory   Obstructive sleep apnea syndrome     Digestive   Gastroesophageal reflux disease without esophagitis    Continue omeprazole 40 mg twice daily.         Endocrine   Hypothyroidism (acquired)    Well controlled. Continue taking Levothyroxine 75 mcg once daily.        Nervous and Auditory   Cervical radiculitis    Start on prednisone       Acute midline low back pain with right-sided sciatica    Start Prednisone 50 mg take 1 tablet daily for 5 days.      Relevant Medications   predniSONE (DELTASONE) 50 MG tablet     Other   Breast cancer of upper-outer quadrant of right female breast ([place]) (Chronic)    Management per specialist.  Continue letrozole.       Relevant Medications   predniSONE (DELTASONE) 50 MG tablet   Morbid obesity ([place])    Recommend continue to work on eating healthy diet and exercise.       Fibromyalgia    The current medical regimen is effective;  continue present plan and medications.      Relevant Medications   predniSONE (DELTASONE) 50 MG tablet   Depression    Well Controlled. Continue Buproprion '150mg'$  and '300mg'$  making total of 450 mg once daily.      Mixed hyperlipidemia    No changes to medicines. Continue Repatha 140 mg injection once every two weeks. Continue to work on eating a healthy diet and exercise.  Labs drawn today.       Relevant Orders   CBC With Diff/Platelet (Completed)   Comprehensive metabolic panel (Completed)   Lipid panel (Completed)  .  Meds ordered this encounter  Medications   predniSONE (DELTASONE) 50 MG tablet    Sig: Take 1 tablet (50 mg total) by mouth daily.    Dispense:  5 tablet    Refill:  0    Orders Placed This Encounter  Procedures   CBC With Diff/Platelet   Comprehensive metabolic panel   Lipid panel   Cardiovascular Risk Assessment     Follow-up: Return in about 3 months (around 01/21/2022) for Chronic fasting follow up, also needs to be scheduled for AWV.Marland Kitchen  An After Visit Summary was printed and given to the patient.  I,Lauren M Auman,acting as a scribe for Rochel Brome, MD.,have documented all relevant documentation on the behalf of Rochel Brome, MD,as directed by  Rochel Brome, MD while in the presence of Rochel Brome, MD.    Rochel Brome, MD [place] [phone]

## 2021-10-21 NOTE — Assessment & Plan Note (Signed)
Well controlled. Continue taking Levothyroxine 75 mcg once daily.

## 2021-10-22 LAB — CBC WITH DIFF/PLATELET
Basophils Absolute: 0.1 10*3/uL (ref 0.0–0.2)
Basos: 1 %
EOS (ABSOLUTE): 0.1 10*3/uL (ref 0.0–0.4)
Eos: 1 %
Hematocrit: 35.2 % (ref 34.0–46.6)
Hemoglobin: 11.1 g/dL (ref 11.1–15.9)
Immature Grans (Abs): 0 10*3/uL (ref 0.0–0.1)
Immature Granulocytes: 0 %
Lymphocytes Absolute: 1.3 10*3/uL (ref 0.7–3.1)
Lymphs: 18 %
MCH: 27.9 pg (ref 26.6–33.0)
MCHC: 31.5 g/dL (ref 31.5–35.7)
MCV: 88 fL (ref 79–97)
Monocytes Absolute: 0.4 10*3/uL (ref 0.1–0.9)
Monocytes: 6 %
Neutrophils Absolute: 5.2 10*3/uL (ref 1.4–7.0)
Neutrophils: 74 %
Platelets: 290 10*3/uL (ref 150–450)
RBC: 3.98 x10E6/uL (ref 3.77–5.28)
RDW: 15.9 % — ABNORMAL HIGH (ref 11.7–15.4)
WBC: 7 10*3/uL (ref 3.4–10.8)

## 2021-10-22 LAB — COMPREHENSIVE METABOLIC PANEL
ALT: 10 IU/L (ref 0–32)
AST: 12 IU/L (ref 0–40)
Albumin/Globulin Ratio: 1.3 (ref 1.2–2.2)
Albumin: 3.5 g/dL — ABNORMAL LOW (ref 3.8–4.8)
Alkaline Phosphatase: 96 IU/L (ref 44–121)
BUN/Creatinine Ratio: 15 (ref 12–28)
BUN: 14 mg/dL (ref 8–27)
Bilirubin Total: 0.4 mg/dL (ref 0.0–1.2)
CO2: 24 mmol/L (ref 20–29)
Calcium: 8.9 mg/dL (ref 8.7–10.3)
Chloride: 104 mmol/L (ref 96–106)
Creatinine, Ser: 0.96 mg/dL (ref 0.57–1.00)
Globulin, Total: 2.6 g/dL (ref 1.5–4.5)
Glucose: 84 mg/dL (ref 70–99)
Potassium: 4.5 mmol/L (ref 3.5–5.2)
Sodium: 141 mmol/L (ref 134–144)
Total Protein: 6.1 g/dL (ref 6.0–8.5)
eGFR: 66 mL/min/{1.73_m2} (ref >59)

## 2021-10-22 LAB — LIPID PANEL
Chol/HDL Ratio: 3 ratio (ref 0.0–4.4)
Cholesterol, Total: 136 mg/dL (ref 100–199)
HDL: 46 mg/dL (ref >39)
LDL Chol Calc (NIH): 68 mg/dL (ref 0–99)
Triglycerides: 122 mg/dL (ref 0–149)
VLDL Cholesterol Cal: 22 mg/dL (ref 5–40)

## 2021-10-30 DIAGNOSIS — K219 Gastro-esophageal reflux disease without esophagitis: Secondary | ICD-10-CM | POA: Insufficient documentation

## 2021-10-30 DIAGNOSIS — I700 Atherosclerosis of aorta: Secondary | ICD-10-CM | POA: Insufficient documentation

## 2021-10-30 NOTE — Assessment & Plan Note (Signed)
Management per specialist.  Continue letrozole.  

## 2021-10-30 NOTE — Assessment & Plan Note (Signed)
Continue omeprazole 40 mg twice daily.  

## 2021-10-30 NOTE — Assessment & Plan Note (Signed)
Continue repatha.  

## 2021-10-30 NOTE — Assessment & Plan Note (Signed)
The current medical regimen is effective;  continue present plan and medications.  

## 2021-10-30 NOTE — Assessment & Plan Note (Signed)
Recommend continue to work on eating healthy diet and exercise.  

## 2021-10-30 NOTE — Assessment & Plan Note (Signed)
Start on prednisone

## 2021-11-03 DIAGNOSIS — M4316 Spondylolisthesis, lumbar region: Secondary | ICD-10-CM | POA: Diagnosis not present

## 2021-11-03 DIAGNOSIS — M7061 Trochanteric bursitis, right hip: Secondary | ICD-10-CM | POA: Diagnosis not present

## 2021-11-03 DIAGNOSIS — M545 Low back pain, unspecified: Secondary | ICD-10-CM | POA: Diagnosis not present

## 2021-11-03 DIAGNOSIS — G8929 Other chronic pain: Secondary | ICD-10-CM | POA: Diagnosis not present

## 2021-11-03 DIAGNOSIS — M47816 Spondylosis without myelopathy or radiculopathy, lumbar region: Secondary | ICD-10-CM | POA: Diagnosis not present

## 2021-11-24 ENCOUNTER — Other Ambulatory Visit: Payer: Self-pay | Admitting: Family Medicine

## 2021-11-24 DIAGNOSIS — S8002XA Contusion of left knee, initial encounter: Secondary | ICD-10-CM

## 2021-11-30 DIAGNOSIS — M47816 Spondylosis without myelopathy or radiculopathy, lumbar region: Secondary | ICD-10-CM | POA: Diagnosis not present

## 2021-12-03 ENCOUNTER — Telehealth: Payer: PPO | Admitting: Physician Assistant

## 2021-12-03 DIAGNOSIS — M549 Dorsalgia, unspecified: Secondary | ICD-10-CM

## 2021-12-03 NOTE — Progress Notes (Signed)
Because of recent procedure and significant symptoms at present, I feel your condition warrants further evaluation and I recommend that you be seen in a face to face visit. I would first call the provider who did your procedure to make them aware of what you are currently experiencing so they can get you taken care of. If unavailable, you need to be evaluated in person either with PCP, at urgent care or ER facility.    NOTE: There will be NO CHARGE for this eVisit   If you are having a true medical emergency please call 911.      For an urgent face to face visit, [facility] has seven urgent care centers for your convenience:     [facility] [place] at [place] Get Driving Directions [phone] [address], [place] [address]    [facility]) Get Driving Directions [phone] [place], [place] [address]  [facility] [place] ([place]) Get Driving Directions [phone] [address] [place],  [place]  [address]  [facility] - at [address] [place] Get Driving Directions  [phone] [address] [place],  [place] [address]   [facility]  Get Driving Directions [phone] [address], [place], [place] [address]   [facility] [place] Get Driving Directions  [phone] [address] [place], [place] [address]   [facility] at  Get Driving Directions [phone] [address], [place], [place] [address]  Your MyChart E-visit questionnaire answers were reviewed by a board certified advanced clinical practitioner to complete your personal care plan based on your specific symptoms.  Thank you for using e-Visits.

## 2021-12-06 ENCOUNTER — Telehealth: Payer: Self-pay

## 2021-12-06 ENCOUNTER — Encounter: Payer: Self-pay | Admitting: Family Medicine

## 2021-12-06 ENCOUNTER — Ambulatory Visit (INDEPENDENT_AMBULATORY_CARE_PROVIDER_SITE_OTHER): Payer: PPO | Admitting: Family Medicine

## 2021-12-06 VITALS — BP 122/82 | HR 83 | Temp 98.7°F | Ht 67.0 in | Wt 259.0 lb

## 2021-12-06 DIAGNOSIS — M25551 Pain in right hip: Secondary | ICD-10-CM

## 2021-12-06 DIAGNOSIS — M25552 Pain in left hip: Secondary | ICD-10-CM | POA: Diagnosis not present

## 2021-12-06 DIAGNOSIS — M545 Low back pain, unspecified: Secondary | ICD-10-CM

## 2021-12-06 DIAGNOSIS — M7918 Myalgia, other site: Secondary | ICD-10-CM | POA: Insufficient documentation

## 2021-12-06 DIAGNOSIS — G8929 Other chronic pain: Secondary | ICD-10-CM | POA: Insufficient documentation

## 2021-12-06 DIAGNOSIS — M5416 Radiculopathy, lumbar region: Secondary | ICD-10-CM | POA: Diagnosis not present

## 2021-12-06 DIAGNOSIS — G894 Chronic pain syndrome: Secondary | ICD-10-CM | POA: Insufficient documentation

## 2021-12-06 MED ORDER — GABAPENTIN 600 MG PO TABS: 600.0000 mg | ORAL_TABLET | Freq: Three times a day (TID) | ORAL | 0 refills | Status: DC

## 2021-12-06 NOTE — Patient Instructions (Signed)
Increase gabapentin to 300 mg 2 capsules three times a day.  (New rx sent for gabapentin 600 mg three times a day.) Go to Physical therapy.  Recommend call neurosurgery back to discuss an appointment to discuss alternatives.  May continue naproxen one twice a day. (No aleve, advil) May use tylenol as instructed on the bottle.

## 2021-12-06 NOTE — Progress Notes (Signed)
Acute Office Visit  Subjective:    Patient ID: Monique Kelly, female    DOB: [birth year], 66 y.o.   MRN: [id number]  Chief Complaint  Patient presents with   Back pain    Received injection of numbing medication from pain management on 11/30/21.    HPI Patient is in today for back pain. Patient has been seeing Pain management, and last saw them and received a back injection of "numbing medication" on 11/30/21. Patient stated she was supposed to report to them once daily on her pain and let them know her pain level.  Patient was also instructed to go home and do things that normally bother her back to show how intense the pain is normally. She stated that the pain has worsened and is having trouble doing her normal ADL's.By 12/02/2021 unable to walk without being bent over, takes 2-3 hours to get dressed. She has been calling them daily and only finally got a response today. She was instructed to go to ED or urgent care. Complaining of BL hip pain and numbness lateral thighs. Does not radiate to feet.  Patient has naproxen 500 mg twice daily, aleve 200 mg 2 daily, tylenol. On gabapentin 300 mg 3 capsules in am and one at night.   Past Medical History:  Diagnosis Date   Atrial fibrillation [facility]) 2016   Sees Dr. [name]. Cardiac ablation, loop recorder (removed in 2022)   Atrial fibrillation with rapid ventricular response ([place]) 04/2015   Breast cancer ([place])    right lumpectomy   BRONCHITIS 03/10/2009   Qualifier: Diagnosis of  By: Geoffry Paradise MD, Kristine Royal    Chest pain with moderate risk for cardiac etiology 04/28/2015   Dehydration 06/18/2009   Qualifier: Diagnosis of  By: Pete Pelt     Depression    Family history of breast cancer    Family history of colon cancer    Fibromyalgia 2010   Hyperlipidemia    Hypothyroidism (acquired) 2006   s/p thyroidectomy for nodules   Morbid obesity ([place]) 08/10/2015   Obesity    gastric bpg 2011, weight then 400 lbs, lost 100 lbs in 60  days.   Obstructive sleep apnea    does not use cpap, does not have a following physician   Paroxysmal atrial fibrillation ([place]) 08/10/2015    Past Surgical History:  Procedure Laterality Date   ABDOMINAL HYSTERECTOMY  1992   total.    APPENDECTOMY     BREAST SURGERY  2019   right lumpectomy    CHOLECYSTECTOMY     COLONOSCOPY     ELECTROPHYSIOLOGIC STUDY N/A 09/08/2015   Procedure: Atrial Fibrillation Ablation;  Surgeon: [name], MD;  Location: [place] [facility];  Service: Cardiovascular;  Laterality: N/A;   EP IMPLANTABLE DEVICE N/A 12/17/2015   Procedure: Loop Recorder Insertion;  Surgeon: [name], MD;  Location: [place] [facility];  Service: Cardiovascular;  Laterality: N/A;   ESOPHAGOGASTRODUODENOSCOPY     EXPLORATORY LAPAROTOMY WITH ABDOMINAL MASS EXCISION  2018   GASTRIC BYPASS  2011   lost 100 lbs, but gained some back.   implantable loop recorder removal  03/25/2020   removed 2022   THYROIDECTOMY  2006   for nodules ( hot and cold nodules) Goiter compressing trachea.    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 Works as Technical brewer for [employer].   Right handed   Social Determinants of Health   Financial Resource Strain: Not on file  Food Insecurity: Not on file  Transportation Needs: Not on file  Physical Activity: Not on file  Stress: Not on file  Social Connections: Not on file  Intimate Partner Violence: Not on file    Outpatient Medications Prior to Visit  Medication Sig Dispense Refill   albuterol (VENTOLIN HFA) 108 (90 Base) MCG/ACT inhaler Inhale into the lungs as needed for wheezing or shortness of breath.     buPROPion (WELLBUTRIN XL) 150 MG 24 hr tablet Take 150 mg by mouth daily.     buPROPion (WELLBUTRIN XL) 300 MG 24 hr tablet Take 300 mg by mouth daily.     calcium carbonate (OS-CAL - DOSED IN MG OF ELEMENTAL CALCIUM) 1250 (500 Ca) MG tablet Take 1 tablet by mouth daily with breakfast.     cyanocobalamin 1000 MCG tablet Take by mouth.     ergocalciferol (VITAMIN D2) 1.25 MG (50000 UT) capsule Take by mouth.     Evolocumab (REPATHA SURECLICK) 878 MG/ML SOAJ Inject 1 pen into the skin every 14 (fourteen) days. 2 mL 11   letrozole (FEMARA) 2.5 MG tablet TAKE ONE TABLET BY MOUTH EVERY DAY 90 tablet 3   levothyroxine (SYNTHROID) 75 MCG tablet Take 75 mcg by mouth daily.      metoprolol tartrate (LOPRESSOR) 25 MG tablet Take 1 tablet (25 mg total) by mouth daily as needed (PALPITATION/ AFIB). 90 tablet 0   naproxen (NAPROSYN) 500 MG tablet Take 1 tablet (500 mg total) by mouth 2 (two) times daily with a meal. 60 tablet 2   omeprazole (PRILOSEC) 40 MG capsule Take 1 capsule (40 mg total) by mouth in the morning and at bedtime. 180 capsule 3   Zoledronic Acid (RECLAST IV) Inject into the vein.     gabapentin (NEURONTIN) 300 MG capsule TAKE 3 CAPSULES in IN THE MORNING and 1 at night. 360 capsule 2   predniSONE (DELTASONE) 50 MG tablet Take 1 tablet (50 mg total) by mouth daily. 5 tablet 0   triamcinolone acetonide (KENALOG-40) injection 80 mg      No  facility-administered medications prior to visit.    Allergies  Allergen Reactions   Nitroglycerin     Hypotension, difficulty maintaining conciousness Hypotension, difficulty maintaining conciousness Hypotension, difficulty maintaining conciousness    Review of Systems  Constitutional:  Negative for appetite change, fatigue and fever.  HENT:  Negative for congestion, ear pain,  sinus pressure and sore throat.   Respiratory:  Negative for cough, chest tightness, shortness of breath and wheezing.   Cardiovascular:  Negative for chest pain and palpitations.  Gastrointestinal:  Negative for abdominal pain, constipation, diarrhea, nausea and vomiting.  Genitourinary:  Negative for dysuria and hematuria.  Musculoskeletal:  Positive for back pain. Negative for arthralgias, joint swelling and myalgias.  Skin:  Negative for rash.  Neurological:  Negative for dizziness, weakness and headaches.  Psychiatric/Behavioral:  Negative for dysphoric mood. The patient is not nervous/anxious.        Objective:    Physical Exam Vitals reviewed.  Constitutional:      Appearance: Normal appearance. She is normal weight.  Cardiovascular:     Rate and Rhythm: Normal rate and regular rhythm.     Heart sounds: Normal heart sounds.  Pulmonary:     Effort: Pulmonary effort is normal.     Breath sounds: Normal breath sounds.  Abdominal:     General: Abdomen is flat. Bowel sounds are normal.     Palpations: Abdomen is soft.  Musculoskeletal:        General: Tenderness (mid lumbar. tender BL mild trochanteric bursa. FROM of hips causing significant discomfort) present.  Neurological:     Mental Status: She is alert and oriented to person, place, and time.  Psychiatric:        Mood and Affect: Mood normal.        Behavior: Behavior normal.     BP 122/82 (BP Location: Right Arm, Patient Position: Sitting)   Pulse 83   Temp 98.7 F (37.1 C) (Oral)   Ht 5' 7" (1.702 m)   Wt 259 lb (117.5 kg)    SpO2 97%   BMI 40.57 kg/m  Wt Readings from Last 3 Encounters:  12/06/21 259 lb (117.5 kg)  10/21/21 254 lb (115.2 kg)  09/08/21 263 lb (119.3 kg)    Health Maintenance Due  Topic Date Due   Zoster Vaccines- Shingrix (1 of 2) Never done   PAP SMEAR-Modifier  Never done   COVID-19 Vaccine (3 - Booster for Janssen series) 05/14/2021   Pneumonia Vaccine 5+ Years old (1 - PCV) Never done    There are no preventive care reminders to display for this patient.   Lab Results  Component Value Date   TSH 1.581 01/20/2021   Lab Results  Component Value Date   WBC 7.0 10/21/2021   HGB 11.1 10/21/2021   HCT 35.2 10/21/2021   MCV 88 10/21/2021   PLT 290 10/21/2021   Lab Results  Component Value Date   NA 141 10/21/2021   K 4.5 10/21/2021   CO2 24 10/21/2021   GLUCOSE 84 10/21/2021   BUN 14 10/21/2021   CREATININE 0.96 10/21/2021   BILITOT 0.4 10/21/2021   ALKPHOS 96 10/21/2021   AST 12 10/21/2021   ALT 10 10/21/2021   PROT 6.1 10/21/2021   ALBUMIN 3.5 (L) 10/21/2021   CALCIUM 8.9 10/21/2021   ANIONGAP 11 07/25/2019   EGFR 66 10/21/2021   Lab Results  Component Value Date   CHOL 136 10/21/2021   Lab Results  Component Value Date   HDL 46 10/21/2021   Lab Results  Component Value Date   LDLCALC 68 10/21/2021   Lab Results  Component Value Date   TRIG 122 10/21/2021   Lab Results  Component Value Date   CHOLHDL 3.0 10/21/2021   No results found for: "HGBA1C"       Assessment & Plan:  Problem List Items Addressed This Visit       Nervous and Auditory   Lumbar radiculopathy    Increase gabapentin 600 mg three times a day.       Relevant Medications   gabapentin (NEURONTIN) 600 MG tablet     Other   Lumbar back pain - Primary    Increase gabapentin to 300 mg 2 capsules three times a day.  (New rx sent for gabapentin 600 mg three times a day.) Go to Physical therapy.  We will call neurosurgery back to change appointment.  May continue  naproxen one twice a day. (No aleve, advil) May use tylenol as instructed on the bottle.  Get xrays of HIPS.      Bilateral hip pain    Increase gabapentin to 300 mg 2 capsules three times a day.  (New rx sent for gabapentin 600 mg three times a day.) Go to Physical therapy.  We will call neurosurgery back to   change appointment. May continue naproxen one twice a day. (No aleve, advil) May use tylenol as instructed on the bottle.  Get xrays of HIPS.      Relevant Orders   DG Hip Unilat W OR W/O Pelvis 2-3 Views Right   DG Hip Unilat W OR W/O Pelvis 2-3 Views Left   Total time spent on today's visit was greater than 30 minutes, including both face-to-face time and nonface-to-face time personally spent on review of chart (labs and imaging), discussing labs and goals, discussing further work-up, treatment options, referrals to specialist if needed, reviewing outside records of pertinent, answering patient's questions, and coordinating care. Left message with neurosurgery. Awaiting return call.   I,Lauren M Auman,acting as a scribe for Rochel Brome, MD.,have documented all relevant documentation on the behalf of Rochel Brome, MD,as directed by  Rochel Brome, MD while in the presence of Rochel Brome, MD.   Follow up: with neurosurgery.   Rochel Brome, MD

## 2021-12-06 NOTE — Assessment & Plan Note (Addendum)
Increase gabapentin to 300 mg 2 capsules three times a day.  (New rx sent for gabapentin 600 mg three times a day.) Go to Physical therapy.  We will call neurosurgery back to   change appointment. May continue naproxen one twice a day. (No aleve, advil) May use tylenol as instructed on the bottle.  Get xrays of HIPS.

## 2021-12-06 NOTE — Assessment & Plan Note (Signed)
Increase gabapentin 600 mg three times a day.

## 2021-12-06 NOTE — Assessment & Plan Note (Addendum)
Increase gabapentin to 300 mg 2 capsules three times a day.  (New rx sent for gabapentin 600 mg three times a day.) Go to Physical therapy.  We will call neurosurgery back to change appointment.  May continue naproxen one twice a day. (No aleve, advil) May use tylenol as instructed on the bottle.  Get xrays of HIPS.

## 2021-12-06 NOTE — Telephone Encounter (Signed)
Monique Kelly called with complaints of severe back pain.  She has seen at the pain clinic and is scheduled for a nerve block in August but she is experiencing severe pain.  Follow-up appointment scheduled for later today with Dr. Tobie Poet.

## 2021-12-08 DIAGNOSIS — M160 Bilateral primary osteoarthritis of hip: Secondary | ICD-10-CM | POA: Diagnosis not present

## 2021-12-08 DIAGNOSIS — M545 Low back pain, unspecified: Secondary | ICD-10-CM | POA: Diagnosis not present

## 2021-12-08 DIAGNOSIS — G8929 Other chronic pain: Secondary | ICD-10-CM | POA: Diagnosis not present

## 2021-12-09 DIAGNOSIS — G8929 Other chronic pain: Secondary | ICD-10-CM | POA: Diagnosis not present

## 2021-12-09 DIAGNOSIS — M545 Low back pain, unspecified: Secondary | ICD-10-CM | POA: Diagnosis not present

## 2021-12-09 DIAGNOSIS — M7061 Trochanteric bursitis, right hip: Secondary | ICD-10-CM | POA: Diagnosis not present

## 2021-12-09 DIAGNOSIS — M47816 Spondylosis without myelopathy or radiculopathy, lumbar region: Secondary | ICD-10-CM | POA: Diagnosis not present

## 2021-12-10 ENCOUNTER — Other Ambulatory Visit: Payer: Self-pay

## 2021-12-10 DIAGNOSIS — M25551 Pain in right hip: Secondary | ICD-10-CM

## 2021-12-10 NOTE — Telephone Encounter (Signed)
Left message with xray results.  Dr. Tobie Poet advised an orthopedic referral  Patient was instructed to call back.

## 2021-12-13 ENCOUNTER — Encounter: Payer: Self-pay | Admitting: Family Medicine

## 2021-12-13 ENCOUNTER — Other Ambulatory Visit: Payer: Self-pay

## 2021-12-13 ENCOUNTER — Telehealth: Payer: Self-pay

## 2021-12-13 DIAGNOSIS — M160 Bilateral primary osteoarthritis of hip: Secondary | ICD-10-CM

## 2021-12-13 NOTE — Telephone Encounter (Signed)
Patient returned call from 7/21. Xray results given. Referral to orthopedics placed.   Royce Macadamia, [place] 12/13/21 1:15 PM

## 2021-12-22 ENCOUNTER — Ambulatory Visit (INDEPENDENT_AMBULATORY_CARE_PROVIDER_SITE_OTHER): Payer: PPO | Admitting: Physician Assistant

## 2021-12-22 ENCOUNTER — Encounter: Payer: Self-pay | Admitting: Physician Assistant

## 2021-12-22 DIAGNOSIS — M7062 Trochanteric bursitis, left hip: Secondary | ICD-10-CM | POA: Diagnosis not present

## 2021-12-22 DIAGNOSIS — M7061 Trochanteric bursitis, right hip: Secondary | ICD-10-CM

## 2021-12-22 MED ORDER — METHYLPREDNISOLONE ACETATE 40 MG/ML IJ SUSP
40.0000 mg | INTRAMUSCULAR | Status: AC | PRN
  Administered 2021-12-22: 40 mg via INTRA_ARTICULAR

## 2021-12-22 MED ORDER — LIDOCAINE HCL 1 % IJ SOLN
3.0000 mL | INTRAMUSCULAR | Status: AC | PRN
  Administered 2021-12-22: 3 mL

## 2021-12-22 NOTE — Progress Notes (Signed)
Office Visit Note   Patient: Monique Kelly           Date of Birth: Oct 28, 1955           MRN: [id number] Visit Date: 12/22/2021              Requested by: Rochel Brome, MD [address] [place],   [id number] PCP: Rochel Brome, MD   Assessment & Plan: Visit Diagnoses:  1. Trochanteric bursitis, left hip   2. Trochanteric bursitis, right hip     Plan: She shown IT band stretching exercises.  We will see her back in 2 weeks to see how she is doing overall.  Questions were encouraged and answered at length.  Patient tolerated injections today without acute adverse event.  Follow-Up Instructions: Return in about 2 weeks (around 01/05/2022).   Orders:  Orders Placed This Encounter  Procedures   Large Joint Inj   No orders of the defined types were placed in this encounter.     Procedures: Large Joint Inj: bilateral greater trochanter on 12/22/2021 6:13 PM Indications: pain Details: 22 G 1.5 in needle, lateral approach  Arthrogram: No  Medications (Right): 3 mL lidocaine 1 %; 40 mg methylPREDNISolone acetate 40 MG/ML Medications (Left): 3 mL lidocaine 1 %; 40 mg methylPREDNISolone acetate 40 MG/ML Outcome: tolerated well, no immediate complications Procedure, treatment alternatives, risks and benefits explained, specific risks discussed. Consent was given by the patient. Immediately prior to procedure a time out was called to verify the correct patient, procedure, equipment, support staff and site/side marked as required. Patient was prepped and draped in the usual sterile fashion.       Clinical Data: No additional findings.   Subjective: Chief Complaint  Patient presents with   Left Hip - Pain   Right Hip - Pain    HPI Monique Kelly is a 66 year old female were seen for the first time for bilateral hip pain.  She has had pain for years.  She also has low back pain and sees pain management.  She is in the process of undergoing lumbar ablation.  She  has been having pain lateral aspect of both hips.  She denies any real groin pain except for 1 episode of right groin pain.  She feels both hips are weak.  She is going to start physical therapy this month for her back and this is being ordered by pain management.  She is on tramadol and Neurontin along with naproxen for her back and she states this does help some with her hip pain.  She denies any real radicular symptoms down either leg.  Notes the hip pain has been ongoing for years.  She is nondiabetic Radiographs bilateral lateral hips and AP pelvis shows bilateral hips to be well located.  Moderate left hip arthritis with diminished joint space and pincer type osteophyte.  Right hip mild joint line narrowing and a slight pincer type osteophyte.  No acute fractures.  Bones appear slightly osteopenic. Review of Systems Denies any fevers or chills.  Objective: Vital Signs: There were no vitals taken for this visit.  Physical Exam Constitutional:      Appearance: She is not ill-appearing or diaphoretic.  Pulmonary:     Effort: Pulmonary effort is normal.  Neurological:     Mental Status: She is alert and oriented to person, place, and time.  Psychiatric:        Behavior: Behavior normal.     Ortho Exam Bilateral hips  fluid range of motion of both hips.  No pain with internal/external rotation.  She has tenderness over the trochanteric region of both hips.  Specialty Comments:  No specialty comments available.  Imaging: No results found.   PMFS History: Patient Active Problem List   Diagnosis Date Noted   Lumbar back pain 12/06/2021   Bilateral hip pain 12/06/2021   Gastroesophageal reflux disease without esophagitis 10/30/2021   Aortic atherosclerosis ([place]) 10/30/2021   Acute midline low back pain with right-sided sciatica 10/21/2021   Orthostatic hypotension 08/04/2021   Acute pain of left knee 08/04/2021   Contusion of left knee 07/20/2021   Pain in left lower leg  07/20/2021   Fall 07/20/2021   Myalgia due to statin 03/25/2021   Fibromyalgia 03/19/2021   Epigastric abdominal pain 02/02/2021   Mixed hyperlipidemia 02/02/2021   Iron deficiency anemia 01/21/2021    Class: Diagnosis of   Dyspnea 06/29/2020   Lumbar radiculopathy 04/23/2020   Peripheral polyneuropathy 04/23/2020   Obesity (BMI 30.0-34.9) 04/21/2020   Cervical radiculitis 12/30/2019   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   Foraminal stenosis of cervical region 12/30/2019   Osteopenia after menopause 10/13/2017   Breast cancer of upper-outer quadrant of right female breast ([place]) 05/30/2017   Hypothyroidism (acquired) 09/29/2016   Depression 09/29/2016   Insomnia 09/29/2016   S/P ablation of atrial fibrillation 09/29/2016   Gastric bypass status for obesity 09/23/2016   H/O cardiac radiofrequency ablation 09/23/2016   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 [name]. Cardiac ablation, loop recorder (removed in 2022)   Atrial fibrillation with rapid ventricular response ([place]) 04/2015   Breast cancer ([place])    right lumpectomy   BRONCHITIS 03/10/2009   Qualifier: Diagnosis of  By: Geoffry Paradise MD, Kristine Royal    Chest pain with moderate risk for cardiac etiology 04/28/2015   Dehydration 06/18/2009   Qualifier: Diagnosis of  By: Pete Pelt     Depression    Family history of breast cancer    Family history of colon cancer    Fibromyalgia 2010   Hyperlipidemia    Hypothyroidism (acquired) 2006   s/p thyroidectomy for nodules   Morbid obesity ([place]) 08/10/2015   Obesity    gastric bpg 2011, weight then 400 lbs, lost 100 lbs in 60 days.   Obstructive sleep apnea    does not use cpap, does not have a following  physician   Paroxysmal atrial fibrillation ([place]) 08/10/2015    Family History  Problem Relation Age of Onset   Heart failure Father    Cancer Father        colon   Heart failure Maternal Grandmother    Heart failure Maternal Grandfather    Cancer Mother        breast    Past Surgical History:  Procedure Laterality Date   ABDOMINAL HYSTERECTOMY  1992   total.    APPENDECTOMY     BREAST SURGERY  2019   right lumpectomy    CHOLECYSTECTOMY     COLONOSCOPY     ELECTROPHYSIOLOGIC STUDY N/A 09/08/2015   Procedure: Atrial Fibrillation Ablation;  Surgeon: [name], MD;  Location: [place] [facility];  Service: Cardiovascular;  Laterality: N/A;   EP IMPLANTABLE  DEVICE N/A 12/17/2015   Procedure: Loop Recorder Insertion;  Surgeon: [name], MD;  Location: [place] [facility];  Service: Cardiovascular;  Laterality: N/A;   ESOPHAGOGASTRODUODENOSCOPY     EXPLORATORY LAPAROTOMY WITH ABDOMINAL MASS EXCISION  2018   GASTRIC BYPASS  2011   lost 100 lbs, but gained some back.   implantable loop recorder removal  03/25/2020   removed 2022   THYROIDECTOMY  2006   for nodules ( hot and cold nodules) Goiter compressing trachea.   Social History   Occupational History   Occupation: Armed forces training and education officer  Tobacco Use   Smoking status: Never   Smokeless tobacco: Never  Vaping Use   Vaping Use: Never used  Substance and Sexual Activity   Alcohol use: Yes    Alcohol/week: 1.0 standard drink of alcohol    Types: 1 Standard drinks or equivalent per week    Comment: rare glass of wine   Drug use: No   Sexual activity: Not Currently

## 2021-12-27 ENCOUNTER — Encounter: Payer: Self-pay | Admitting: Cardiology

## 2021-12-27 ENCOUNTER — Ambulatory Visit (INDEPENDENT_AMBULATORY_CARE_PROVIDER_SITE_OTHER): Payer: PPO | Admitting: Cardiology

## 2021-12-27 VITALS — BP 112/68 | HR 80 | Ht 67.0 in | Wt 254.8 lb

## 2021-12-27 DIAGNOSIS — I480 Paroxysmal atrial fibrillation: Secondary | ICD-10-CM | POA: Diagnosis not present

## 2021-12-27 DIAGNOSIS — I4891 Unspecified atrial fibrillation: Secondary | ICD-10-CM

## 2021-12-27 NOTE — Patient Instructions (Signed)
Medication Instructions:  Your physician recommends that you continue on your current medications as directed. Please refer to the Current Medication list given to you today.  *If you need a refill on your cardiac medications before your next appointment, please call your pharmacy*   Lab Work: None ordered.  If you have labs (blood work) drawn today and your tests are completely normal, you will receive your results only by: [place] (if you have MyChart) OR A paper copy in the mail If you have any lab test that is abnormal or we need to change your treatment, we will call you to review the results.   Testing/Procedures: None ordered.    Follow-Up: At [facility], you and your health needs are our priority.  As part of our continuing mission to provide you with exceptional heart care, we have created designated Provider Care Teams.  These Care Teams include your primary Cardiologist (physician) and Advanced Practice Providers (APPs -  Physician Assistants and Nurse Practitioners) who all work together to provide you with the care you need, when you need it.  We recommend signing up for the patient portal called "MyChart".  Sign up information is provided on this After Visit Summary.  MyChart is used to connect with patients for Virtual Visits (Telemedicine).  Patients are able to view lab/test results, encounter notes, upcoming appointments, etc.  Non-urgent messages can be sent to your provider as well.   To learn more about what you can do with MyChart, go to [email or web address].    Your next appointment:   12 months with Dr Curt Bears  Important Information About [place]

## 2021-12-27 NOTE — Progress Notes (Signed)
Electrophysiology Office Note   Date:  12/27/2021   ID:  [name], [name] 02-06-1956, MRN [id number]  PCP:  Rochel Brome, MD  Cardiologist:   Primary Electrophysiologist:  Ayssa Bentivegna Meredith Leeds, MD    Chief Complaint: Atrial fibrillation   History of Present Illness: Monique Kelly is a 66 y.o. female who is being seen today for the evaluation of atrial fibrillation at the request of Cox, Elnita Maxwell, MD. Presenting today for electrophysiology evaluation.  She has a history significant for hypothyroidism, hyperlipidemia, obstructive sleep apnea and uses a dental device, obesity post gastric bypass, breast cancer, atrial fibrillation.  She is status post atrial fibrillation ablation 09/08/2015.  She is currently feeling well.  She has no complaints at this time.  She did have an episode of atrial fibrillation this past winter that lasted for a week, but has had no further episodes and is overall quite comfortable with her control.  Today, she denies symptoms of palpitations, chest pain, shortness of breath, orthopnea, PND, lower extremity edema, claudication, dizziness, presyncope, syncope, bleeding, or neurologic sequela. The patient is tolerating medications without difficulties.    Past Medical History:  Diagnosis Date   Atrial fibrillation [facility]) 2016   Sees Dr. [name]. Cardiac ablation, loop recorder (removed in 2022)   Atrial fibrillation with rapid ventricular response ([place]) 04/2015   Breast cancer ([place])    right lumpectomy   BRONCHITIS 03/10/2009   Qualifier: Diagnosis of  By: Geoffry Paradise MD, Kristine Royal    Chest pain with moderate risk for cardiac etiology 04/28/2015   Dehydration 06/18/2009   Qualifier: Diagnosis of  By: Pete Pelt     Depression    Family history of breast cancer    Family history of colon cancer    Fibromyalgia 2010   Hyperlipidemia    Hypothyroidism (acquired) 2006   s/p thyroidectomy for nodules   Morbid obesity ([place]) 08/10/2015   Obesity     gastric bpg 2011, weight then 400 lbs, lost 100 lbs in 60 days.   Obstructive sleep apnea    does not use cpap, does not have a following physician   Paroxysmal atrial fibrillation ([place]) 08/10/2015   Past Surgical History:  Procedure Laterality Date   ABDOMINAL HYSTERECTOMY  1992   total.    APPENDECTOMY     BREAST SURGERY  2019   right lumpectomy    CHOLECYSTECTOMY     COLONOSCOPY     ELECTROPHYSIOLOGIC STUDY N/A 09/08/2015   Procedure: Atrial Fibrillation Ablation;  Surgeon: [name], MD;  Location: [facility];  Service: Cardiovascular;  Laterality: N/A;   EP IMPLANTABLE DEVICE N/A 12/17/2015   Procedure: Loop Recorder Insertion;  Surgeon: [name], MD;  Location: [facility];  Service: Cardiovascular;  Laterality: N/A;   ESOPHAGOGASTRODUODENOSCOPY     EXPLORATORY LAPAROTOMY WITH ABDOMINAL MASS EXCISION  2018   GASTRIC BYPASS  2011   lost 100 lbs, but gained some back.   implantable loop recorder removal  03/25/2020   removed 2022   THYROIDECTOMY  2006   for nodules ( hot and cold nodules) Goiter compressing trachea.     Current Outpatient Medications  Medication Sig Dispense Refill   albuterol (VENTOLIN HFA) 108 (90 Base) MCG/ACT inhaler Inhale into the lungs as needed for wheezing or shortness of breath.     buPROPion (WELLBUTRIN XL) 150 MG 24 hr tablet Take 150 mg by mouth daily.     buPROPion (WELLBUTRIN XL) 300 MG 24 hr tablet  Take 300 mg by mouth daily.     calcium carbonate (OS-CAL - DOSED IN MG OF ELEMENTAL CALCIUM) 1250 (500 Ca) MG tablet Take 1 tablet by mouth daily with breakfast.     cyanocobalamin 1000 MCG tablet Take by mouth.     ergocalciferol (VITAMIN D2) 1.25 MG (50000 UT) capsule Take by mouth.     Evolocumab (REPATHA SURECLICK) 329 MG/ML SOAJ Inject 1 pen into the skin every 14 (fourteen) days. 2 mL 11   gabapentin (NEURONTIN) 600 MG tablet Take 1 tablet (600 mg total) by mouth 3 (three) times daily. 270 tablet 0   letrozole (FEMARA)  2.5 MG tablet TAKE ONE TABLET BY MOUTH EVERY DAY 90 tablet 3   levothyroxine (SYNTHROID) 75 MCG tablet Take 75 mcg by mouth daily.      metoprolol tartrate (LOPRESSOR) 25 MG tablet Take 1 tablet (25 mg total) by mouth daily as needed (PALPITATION/ AFIB). 90 tablet 0   naproxen (NAPROSYN) 500 MG tablet Take 1 tablet (500 mg total) by mouth 2 (two) times daily with a meal. 60 tablet 2   omeprazole (PRILOSEC) 40 MG capsule Take 1 capsule (40 mg total) by mouth in the morning and at bedtime. 180 capsule 3   traMADol (ULTRAM) 50 MG tablet Take 1 tablet by mouth at bedtime as needed. pain     Zoledronic Acid (RECLAST IV) Inject into the vein.     No current facility-administered medications for this visit.    Allergies:   Nitroglycerin   Social History:  The patient  reports that she has never smoked. She has never used smokeless tobacco. She reports current alcohol use of about 1.0 standard drink of alcohol per week. She reports that she does not use drugs.   Family History:  The patient's family history includes Cancer in her father and mother; Heart failure in her father, maternal grandfather, and maternal grandmother.    ROS:  Please see the history of present illness.   Otherwise, review of systems is positive for none.   All other systems are reviewed and negative.    PHYSICAL EXAM: VS:  BP 112/68   Pulse 80   Ht '5\' 7"'$  (1.702 m)   Wt 254 lb 12.8 oz (115.6 kg)   SpO2 91%   BMI 39.91 kg/m  , BMI Body mass index is 39.91 kg/m. GEN: Well nourished, well developed, in no acute distress  HEENT: normal  Neck: no JVD, carotid bruits, or masses Cardiac: RRR; no murmurs, rubs, or gallops,no edema  Respiratory:  clear to auscultation bilaterally, normal work of breathing GI: soft, nontender, nondistended, + BS MS: no deformity or atrophy  Skin: warm and dry Neuro:  Strength and sensation are intact Psych: euthymic mood, full affect  EKG:  EKG is ordered today. Personal review of the  ekg ordered shows sinus rhythm  Recent Labs: 01/20/2021: TSH 1.581 10/21/2021: ALT 10; BUN 14; Creatinine, Ser 0.96; Hemoglobin 11.1; Platelets 290; Potassium 4.5; Sodium 141    Lipid Panel     Component Value Date/Time   CHOL 136 10/21/2021 1502   TRIG 122 10/21/2021 1502   HDL 46 10/21/2021 1502   CHOLHDL 3.0 10/21/2021 1502   LDLCALC 68 10/21/2021 1502     Wt Readings from Last 3 Encounters:  12/27/21 254 lb 12.8 oz (115.6 kg)  12/06/21 259 lb (117.5 kg)  10/21/21 254 lb (115.2 kg)      Other studies Reviewed: Additional studies/ records that were reviewed today include: TTE 09/23/21  Review of the above records today demonstrates:   1. Left ventricular ejection fraction, by estimation, is 55 to 60%. The  left ventricle has normal function. The left ventricle has no regional  wall motion abnormalities. Left ventricular diastolic parameters are  consistent with Grade I diastolic  dysfunction (impaired relaxation).   2. Right ventricular systolic function is normal. The right ventricular  size is normal.   3. Left atrial size was mild to moderately dilated.   4. The mitral valve is normal in structure. No evidence of mitral valve  regurgitation. No evidence of mitral stenosis.   5. The aortic valve is normal in structure. Aortic valve regurgitation is  not visualized. No aortic stenosis is present.   6. The inferior vena cava is normal in size with greater than 50%  respiratory variability, suggesting right atrial pressure of 3 mmHg.    ASSESSMENT AND PLAN:  1.  Paroxysmal atrial fibrillation: Status post prior ablation.  Currently on metoprolol as needed.  CHA2DS2-VASc of 1.  She has had minimal episodes of atrial fibrillation over the last few months.  She is overall happy with her plan.  No changes.  2.  Obstructive sleep apnea: Has a dental device.  Continue with current management.      Current medicines are reviewed at length with the patient today.   The  patient does not have concerns regarding her medicines.  The following changes were made today:  none  Labs/ tests ordered today include:  Orders Placed This Encounter  Procedures   EKG 12-Lead     Disposition:   FU with Alecia Doi 1 year  Signed, Anakaren Campion Meredith Leeds, MD  12/27/2021 12:32 PM     [place] [address] [place] [address] [phone] (office) [phone] (fax)

## 2021-12-28 DIAGNOSIS — M47816 Spondylosis without myelopathy or radiculopathy, lumbar region: Secondary | ICD-10-CM | POA: Diagnosis not present

## 2021-12-31 ENCOUNTER — Other Ambulatory Visit: Payer: Self-pay | Admitting: Hematology and Oncology

## 2021-12-31 DIAGNOSIS — C50411 Malignant neoplasm of upper-outer quadrant of right female breast: Secondary | ICD-10-CM

## 2022-01-03 ENCOUNTER — Inpatient Hospital Stay: Payer: PPO

## 2022-01-03 ENCOUNTER — Encounter: Payer: Self-pay | Admitting: Hematology and Oncology

## 2022-01-03 ENCOUNTER — Inpatient Hospital Stay: Payer: PPO | Attending: Hematology and Oncology | Admitting: Hematology and Oncology

## 2022-01-03 VITALS — BP 129/74 | HR 79 | Temp 98.2°F | Resp 20 | Ht 67.0 in | Wt 258.7 lb

## 2022-01-03 DIAGNOSIS — Z170 Estrogen receptor positive status [ER+]: Secondary | ICD-10-CM | POA: Diagnosis not present

## 2022-01-03 DIAGNOSIS — Z452 Encounter for adjustment and management of vascular access device: Secondary | ICD-10-CM | POA: Diagnosis not present

## 2022-01-03 DIAGNOSIS — Z79811 Long term (current) use of aromatase inhibitors: Secondary | ICD-10-CM | POA: Insufficient documentation

## 2022-01-03 DIAGNOSIS — Z79899 Other long term (current) drug therapy: Secondary | ICD-10-CM | POA: Diagnosis not present

## 2022-01-03 DIAGNOSIS — M8580 Other specified disorders of bone density and structure, unspecified site: Secondary | ICD-10-CM | POA: Diagnosis not present

## 2022-01-03 DIAGNOSIS — Z171 Estrogen receptor negative status [ER-]: Secondary | ICD-10-CM

## 2022-01-03 DIAGNOSIS — C50411 Malignant neoplasm of upper-outer quadrant of right female breast: Secondary | ICD-10-CM | POA: Diagnosis not present

## 2022-01-03 DIAGNOSIS — Z780 Asymptomatic menopausal state: Secondary | ICD-10-CM | POA: Diagnosis not present

## 2022-01-03 LAB — BASIC METABOLIC PANEL
BUN: 21 (ref 4–21)
CO2: 25 — AB (ref 13–22)
Chloride: 110 — AB (ref 99–108)
Creatinine: 0.8 (ref 0.5–1.1)
Glucose: 79
Potassium: 4.2 mEq/L (ref 3.5–5.1)
Sodium: 139 (ref 137–147)

## 2022-01-03 LAB — CBC AND DIFFERENTIAL
HCT: 33 — AB (ref 36–46)
Hemoglobin: 10.3 — AB (ref 12.0–16.0)
Neutrophils Absolute: 5.43
Platelets: 239 10*3/uL (ref 150–400)
WBC: 8.1

## 2022-01-03 LAB — HEPATIC FUNCTION PANEL
ALT: 12 U/L (ref 7–35)
AST: 16 (ref 13–35)
Alkaline Phosphatase: 87 (ref 25–125)
Bilirubin, Total: 0.5

## 2022-01-03 LAB — COMPREHENSIVE METABOLIC PANEL
Albumin: 3 — AB (ref 3.5–5.0)
Calcium: 8.2 — AB (ref 8.7–10.7)

## 2022-01-03 LAB — CBC: RBC: 3.69 — AB (ref 3.87–5.11)

## 2022-01-03 MED ORDER — SODIUM CHLORIDE 0.9% FLUSH
10.0000 mL | INTRAVENOUS | Status: DC | PRN
  Administered 2022-01-03: 10 mL

## 2022-01-03 MED ORDER — HEPARIN SOD (PORK) LOCK FLUSH 100 UNIT/ML IV SOLN
500.0000 [IU] | Freq: Once | INTRAVENOUS | Status: AC | PRN
  Administered 2022-01-03: 500 [IU]

## 2022-01-03 NOTE — Assessment & Plan Note (Signed)
Osteopenia, for which she was started on yearly Reclast in July 2021. She continues OsCal BID. She had a recent CT for cardiac evaluation which revealed a rib fracture associated with trauma. She does report several falls over the last 6 months. Her PCP is helping to evaluate her issues with falling. She is due for bone density exam now. I will order this and will call her with results. She is also due Reclast in September.

## 2022-01-03 NOTE — Progress Notes (Signed)
Patient Care Team: Rochel Brome, MD as PCP - General (Family Medicine) [name], MD as PCP - Electrophysiology (Cardiology) [name], MD as Consulting Physician (Cardiology) Derwood Kaplan, MD as Consulting Physician (Oncology) Pollyann Samples, MD as Consulting Physician (General Surgery) Misenheimer, Christia Reading, MD as Consulting Physician (Unknown Physician Specialty) Oneal Grout, DDS as Referring Physician (Orthodontics) Noberto Retort Juanda Bond., MD as Referring Physician (Surgery)  Clinic Day:  01/03/2022  Referring physician: Rochel Brome, MD  ASSESSMENT & PLAN:   Assessment & Plan: Breast cancer of upper-outer quadrant of right female breast [facility]) 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 She remains without evidence of recurrence. She will continue letrozole 2.5 mg daily for at least 5 years, until August 2024. She will return to clinic in 6 months for repeat evaluation.   Osteopenia after menopause Osteopenia, for which she was started on yearly Reclast in July 2021. She continues OsCal BID. She had a recent CT for cardiac evaluation which revealed a rib fracture associated with trauma. She does report several falls over the last 6 months. Her PCP is helping to evaluate her issues with falling. She is due for bone density exam now. I will order this and will call her with results. She is also due Reclast in September.    The patient understands the plans discussed today and is in agreement with them.  She knows to contact our office if she develops concerns prior to her next appointment.   Monique Ped, NP  [place] [address] [place] [address] Dept: [phone] Dept Fax: [phone]   Orders Placed This Encounter  Procedures   DG Bone Density    Standing Status:   Future    Standing Expiration Date:   01/03/2023    Order Specific Question:   Reason for Exam  (SYMPTOM  OR DIAGNOSIS REQUIRED)    Answer:   Patient with breast cancer on letrozole    Order Specific Question:   Preferred imaging location?    Answer:   External   CBC and differential    This external order was created through the Results Console.   CBC    This external order was created through the Results Console.   Basic metabolic panel    This external order was created through the Results Console.   Basic metabolic panel    This external order was created through the Results Console.   Comprehensive metabolic panel    This external order was created through the Results Console.   Hepatic function panel    This external order was created through the Results Console.      CHIEF COMPLAINT:  CC: A 66 year old female with history of breast cancer here for 6 month evaluation.  Current Treatment:  Letrozole  INTERVAL HISTORY:  Monique Kelly is here today for repeat clinical assessment. She denies fevers or chills. She denies pain. Her appetite is good. Her weight has increased 4 pounds over last 6 months .  I have reviewed the past medical history, past surgical history, social history and family history with the patient and they are unchanged from previous note.  ALLERGIES:  is allergic to nitroglycerin.  MEDICATIONS:  Current Outpatient Medications  Medication Sig Dispense Refill   albuterol (VENTOLIN HFA) 108 (90 Base) MCG/ACT inhaler Inhale into the lungs as needed for wheezing or shortness of breath.     buPROPion (WELLBUTRIN XL) 150 MG 24 hr tablet  Take 150 mg by mouth daily.     buPROPion (WELLBUTRIN XL) 300 MG 24 hr tablet Take 300 mg by mouth daily.     calcium carbonate (OS-CAL - DOSED IN MG OF ELEMENTAL CALCIUM) 1250 (500 Ca) MG tablet Take 1 tablet by mouth daily with breakfast.     cyanocobalamin 1000 MCG tablet Take by mouth.     ergocalciferol (VITAMIN D2) 1.25 MG (50000 UT) capsule Take by mouth.     Evolocumab (REPATHA SURECLICK) 478 MG/ML SOAJ Inject 1 pen into the  skin every 14 (fourteen) days. 2 mL 11   gabapentin (NEURONTIN) 600 MG tablet Take 1 tablet (600 mg total) by mouth 3 (three) times daily. 270 tablet 0   letrozole (FEMARA) 2.5 MG tablet TAKE ONE TABLET BY MOUTH EVERY DAY 90 tablet 3   levothyroxine (SYNTHROID) 75 MCG tablet Take 75 mcg by mouth daily.      metoprolol tartrate (LOPRESSOR) 25 MG tablet Take 1 tablet (25 mg total) by mouth daily as needed (PALPITATION/ AFIB). 90 tablet 0   naproxen (NAPROSYN) 500 MG tablet Take 1 tablet (500 mg total) by mouth 2 (two) times daily with a meal. 60 tablet 2   omeprazole (PRILOSEC) 40 MG capsule Take 1 capsule (40 mg total) by mouth in the morning and at bedtime. 180 capsule 3   traMADol (ULTRAM) 50 MG tablet Take 1 tablet by mouth at bedtime as needed. pain     Zoledronic Acid (RECLAST IV) Inject into the vein.     Current Facility-Administered Medications  Medication Dose Route Frequency Provider Last Rate Last Admin   sodium chloride flush (NS) 0.9 % injection 10 mL  10 mL Intracatheter PRN Derwood Kaplan, MD   10 mL at 01/03/22 1420    HISTORY OF PRESENT ILLNESS:   Oncology History  Breast cancer of upper-outer quadrant of right female breast ([place])  05/30/2017 Initial Diagnosis   Breast cancer of upper-outer quadrant of right female breast ([place])   06/10/2017 Cancer Staging   Staging form: Breast, AJCC 8th Edition - Clinical stage from 06/10/2017: Stage IIA (cT2, cN0(sn), cM0, G3, ER+, PR+, HER2-) - Signed by Derwood Kaplan, MD on 01/24/2021 Histopathologic type: Infiltrating duct carcinoma, NOS Stage prefix: Initial diagnosis Method of lymph node assessment: Sentinel lymph node biopsy Nuclear grade: G3 Multigene prognostic tests performed: EndoPredict Histologic grading system: 3 grade system Laterality: Right Tumor size (mm): 21 Lymph-vascular invasion (LVI): LVI not present (absent)/not identified Diagnostic confirmation: Positive histology Specimen type: Excision Staged by:  Managing physician Menopausal status: Postmenopausal Ki-67 (%): 20 EndoPredict EPclin risk score: 4 EndoPredict EPclin risk level: High risk EndoPredict 10-year percentage risk of distant recurrence (%): 18 Stage used in treatment planning: Yes National guidelines used in treatment planning: Yes Type of national guideline used in treatment planning: NCCN Staging comments: TC x 4, lumpectomy, radiation, letrozole since Aug 2019       REVIEW OF SYSTEMS:   Constitutional: Denies fevers, chills or abnormal weight loss Eyes: Denies blurriness of vision Ears, nose, mouth, throat, and face: Denies mucositis or sore throat Respiratory: Denies cough, dyspnea or wheezes Cardiovascular: Denies palpitation, chest discomfort or lower extremity swelling Gastrointestinal:  Denies nausea, heartburn or change in bowel habits Skin: Denies abnormal skin rashes Lymphatics: Denies new lymphadenopathy or easy bruising Neurological:Denies numbness, tingling or new weaknesses Behavioral/Psych: Mood is stable, no new changes  All other systems were reviewed with the patient and are negative.   VITALS:  Blood pressure 129/74, pulse 79,  temperature 98.2 F (36.8 C), temperature source Oral, resp. rate 20, height '5\' 7"'  (1.702 m), weight 258 lb 11.2 oz (117.3 kg), SpO2 90 %.  Wt Readings from Last 3 Encounters:  01/03/22 258 lb 11.2 oz (117.3 kg)  12/27/21 254 lb 12.8 oz (115.6 kg)  12/06/21 259 lb (117.5 kg)    Body mass index is 40.52 kg/m.  Performance status (ECOG): 1 - Symptomatic but completely ambulatory  PHYSICAL EXAM:   GENERAL:alert, no distress and comfortable SKIN: skin color, texture, turgor are normal, no rashes or significant lesions EYES: normal, Conjunctiva are pink and non-injected, sclera clear OROPHARYNX:no exudate, no erythema and lips, buccal mucosa, and tongue normal  NECK: supple, thyroid normal size, non-tender, without nodularity LYMPH:  no palpable lymphadenopathy in  the cervical, axillary or inguinal LUNGS: clear to auscultation and percussion with normal breathing effort HEART: regular rate & rhythm and no murmurs and no lower extremity edema ABDOMEN:abdomen soft, non-tender and normal bowel sounds Musculoskeletal:no cyanosis of digits and no clubbing  NEURO: alert & oriented x 3 with fluent speech, no focal motor/sensory deficits  LABORATORY DATA:  I have reviewed the data as listed    Component Value Date/Time   NA 139 01/03/2022 0000   K 4.2 01/03/2022 0000   CL 110 (A) 01/03/2022 0000   CO2 25 (A) 01/03/2022 0000   GLUCOSE 84 10/21/2021 1502   GLUCOSE 100 (H) 07/25/2019 0827   BUN 21 01/03/2022 0000   CREATININE 0.8 01/03/2022 0000   CREATININE 0.96 10/21/2021 1502   CREATININE 0.86 08/24/2015 1257   CALCIUM 8.2 (A) 01/03/2022 0000   PROT 6.1 10/21/2021 1502   ALBUMIN 3.0 (A) 01/03/2022 0000   ALBUMIN 3.5 (L) 10/21/2021 1502   AST 16 01/03/2022 0000   ALT 12 01/03/2022 0000   ALKPHOS 87 01/03/2022 0000   BILITOT 0.4 10/21/2021 1502   GFRNONAA >60 07/25/2019 0827   GFRAA >60 07/25/2019 0827    No results found for: "SPEP", "UPEP"  Lab Results  Component Value Date   WBC 8.1 01/03/2022   NEUTROABS 5.43 01/03/2022   HGB 10.3 (A) 01/03/2022   HCT 33 (A) 01/03/2022   MCV 88 10/21/2021   PLT 239 01/03/2022      Chemistry      Component Value Date/Time   NA 139 01/03/2022 0000   K 4.2 01/03/2022 0000   CL 110 (A) 01/03/2022 0000   CO2 25 (A) 01/03/2022 0000   BUN 21 01/03/2022 0000   CREATININE 0.8 01/03/2022 0000   CREATININE 0.96 10/21/2021 1502   CREATININE 0.86 08/24/2015 1257   GLU 79 01/03/2022 0000      Component Value Date/Time   CALCIUM 8.2 (A) 01/03/2022 0000   ALKPHOS 87 01/03/2022 0000   AST 16 01/03/2022 0000   ALT 12 01/03/2022 0000   BILITOT 0.4 10/21/2021 1502       RADIOGRAPHIC STUDIES: I have personally reviewed the radiological images as listed and agreed with the findings in the report. No  results found.

## 2022-01-03 NOTE — Assessment & Plan Note (Addendum)
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 She remains without evidence of recurrence. She will continue letrozole 2.5 mg daily for at least 5 years, until August 2024. She will return to clinic in 6 months for repeat evaluation.

## 2022-01-05 ENCOUNTER — Ambulatory Visit: Payer: PPO | Admitting: Physician Assistant

## 2022-01-06 DIAGNOSIS — M47816 Spondylosis without myelopathy or radiculopathy, lumbar region: Secondary | ICD-10-CM | POA: Diagnosis not present

## 2022-01-06 DIAGNOSIS — M2560 Stiffness of unspecified joint, not elsewhere classified: Secondary | ICD-10-CM | POA: Diagnosis not present

## 2022-01-06 DIAGNOSIS — R293 Abnormal posture: Secondary | ICD-10-CM | POA: Diagnosis not present

## 2022-01-06 DIAGNOSIS — M6281 Muscle weakness (generalized): Secondary | ICD-10-CM | POA: Diagnosis not present

## 2022-01-06 DIAGNOSIS — M25611 Stiffness of right shoulder, not elsewhere classified: Secondary | ICD-10-CM | POA: Diagnosis not present

## 2022-01-06 DIAGNOSIS — M7061 Trochanteric bursitis, right hip: Secondary | ICD-10-CM | POA: Diagnosis not present

## 2022-01-06 DIAGNOSIS — M7062 Trochanteric bursitis, left hip: Secondary | ICD-10-CM | POA: Diagnosis not present

## 2022-01-06 DIAGNOSIS — M25612 Stiffness of left shoulder, not elsewhere classified: Secondary | ICD-10-CM | POA: Diagnosis not present

## 2022-01-06 DIAGNOSIS — R2689 Other abnormalities of gait and mobility: Secondary | ICD-10-CM | POA: Diagnosis not present

## 2022-01-20 DIAGNOSIS — M47816 Spondylosis without myelopathy or radiculopathy, lumbar region: Secondary | ICD-10-CM | POA: Diagnosis not present

## 2022-01-25 DIAGNOSIS — R293 Abnormal posture: Secondary | ICD-10-CM | POA: Diagnosis not present

## 2022-01-25 DIAGNOSIS — M6281 Muscle weakness (generalized): Secondary | ICD-10-CM | POA: Diagnosis not present

## 2022-01-25 DIAGNOSIS — M25612 Stiffness of left shoulder, not elsewhere classified: Secondary | ICD-10-CM | POA: Diagnosis not present

## 2022-01-25 DIAGNOSIS — M7062 Trochanteric bursitis, left hip: Secondary | ICD-10-CM | POA: Diagnosis not present

## 2022-01-25 DIAGNOSIS — M47816 Spondylosis without myelopathy or radiculopathy, lumbar region: Secondary | ICD-10-CM | POA: Diagnosis not present

## 2022-01-25 DIAGNOSIS — R2689 Other abnormalities of gait and mobility: Secondary | ICD-10-CM | POA: Diagnosis not present

## 2022-01-25 DIAGNOSIS — M2560 Stiffness of unspecified joint, not elsewhere classified: Secondary | ICD-10-CM | POA: Diagnosis not present

## 2022-01-25 DIAGNOSIS — M7061 Trochanteric bursitis, right hip: Secondary | ICD-10-CM | POA: Diagnosis not present

## 2022-01-25 DIAGNOSIS — M25611 Stiffness of right shoulder, not elsewhere classified: Secondary | ICD-10-CM | POA: Diagnosis not present

## 2022-01-27 ENCOUNTER — Telehealth: Payer: Self-pay

## 2022-01-27 NOTE — Telephone Encounter (Signed)
Monique Kelly called with concerns with frequent falls and left leg weakness.  She reports that her symptoms started over 1 week ago.  She has had not problems with her speech and no headache or dizziness.  Follow-up scheduled with Dr. Tobie Poet for evaluation.

## 2022-01-31 ENCOUNTER — Ambulatory Visit (INDEPENDENT_AMBULATORY_CARE_PROVIDER_SITE_OTHER): Payer: PPO | Admitting: Family Medicine

## 2022-01-31 ENCOUNTER — Encounter: Payer: Self-pay | Admitting: Family Medicine

## 2022-01-31 VITALS — BP 110/60 | HR 95 | Temp 97.6°F | Resp 15 | Ht 67.0 in | Wt 259.0 lb

## 2022-01-31 DIAGNOSIS — R600 Localized edema: Secondary | ICD-10-CM | POA: Insufficient documentation

## 2022-01-31 DIAGNOSIS — M25512 Pain in left shoulder: Secondary | ICD-10-CM

## 2022-01-31 DIAGNOSIS — M5440 Lumbago with sciatica, unspecified side: Secondary | ICD-10-CM

## 2022-01-31 DIAGNOSIS — E782 Mixed hyperlipidemia: Secondary | ICD-10-CM

## 2022-01-31 DIAGNOSIS — D638 Anemia in other chronic diseases classified elsewhere: Secondary | ICD-10-CM | POA: Diagnosis not present

## 2022-01-31 MED ORDER — FUROSEMIDE 20 MG PO TABS: 20.0000 mg | ORAL_TABLET | Freq: Every day | ORAL | 0 refills | Status: DC | PRN

## 2022-01-31 MED ORDER — TRIAMCINOLONE ACETONIDE 40 MG/ML IJ SUSP: 40.0000 mg | Freq: Once | INTRAMUSCULAR | Status: DC

## 2022-01-31 NOTE — Assessment & Plan Note (Signed)
Check cbc and iron studies.   

## 2022-01-31 NOTE — Progress Notes (Signed)
Subjective:  Patient ID: Monique Kelly, female    DOB: August 07, 1955  Age: 66 y.o. MRN: [id number]  Chief Complaint  Patient presents with   Arm Pain    Left    HPI Patient has chronic throbbing pain on her left leg and left arm especially at night time. Had radioablation procedure on left side about one month ago.  Pain reaches 10/10 at night. During the day it is 2/10. Taking tramadol 50 mg twice daily, naproxen 500 mg twice daily , and a muscle relaxant (robaxan) 3 times per day. Helps for a while, but then returns.  Patient is falling more. Left leg goes to sleep and falls when she gets up. Also having left leg cramps.  Her left shoulder is very painful.   Current Outpatient Medications on File Prior to Visit  Medication Sig Dispense Refill   buPROPion (WELLBUTRIN XL) 150 MG 24 hr tablet Take 150 mg by mouth daily.     buPROPion (WELLBUTRIN XL) 300 MG 24 hr tablet Take 300 mg by mouth daily.     calcium carbonate (OS-CAL - DOSED IN MG OF ELEMENTAL CALCIUM) 1250 (500 Ca) MG tablet Take 1 tablet by mouth daily with breakfast.     cyanocobalamin 1000 MCG tablet Take by mouth.     ergocalciferol (VITAMIN D2) 1.25 MG (50000 UT) capsule Take by mouth.     Evolocumab (REPATHA SURECLICK) 283 MG/ML SOAJ Inject 1 pen into the skin every 14 (fourteen) days. 2 mL 11   gabapentin (NEURONTIN) 600 MG tablet Take 1 tablet (600 mg total) by mouth 3 (three) times daily. 270 tablet 0   letrozole (FEMARA) 2.5 MG tablet TAKE ONE TABLET BY MOUTH EVERY DAY 90 tablet 3   levothyroxine (SYNTHROID) 75 MCG tablet Take 75 mcg by mouth daily.      methocarbamol (ROBAXIN) 500 MG tablet Take by mouth.     metoprolol tartrate (LOPRESSOR) 25 MG tablet Take 1 tablet (25 mg total) by mouth daily as needed (PALPITATION/ AFIB). 90 tablet 0   naproxen (NAPROSYN) 500 MG tablet Take 1 tablet (500 mg total) by mouth 2 (two) times daily with a meal. 60 tablet 2   omeprazole (PRILOSEC) 40 MG capsule Take 1 capsule (40 mg  total) by mouth in the morning and at bedtime. 180 capsule 3   traMADol (ULTRAM) 50 MG tablet Take 1 tablet by mouth at bedtime as needed. pain     Zoledronic Acid (RECLAST IV) Inject into the vein.     No current facility-administered medications on file prior to visit.   Past Medical History:  Diagnosis Date   Atrial fibrillation [facility]) 2016   Sees Dr. [name]. Cardiac ablation, loop recorder (removed in 2022)   Atrial fibrillation with rapid ventricular response ([place]) 04/2015   Breast cancer ([place])    right lumpectomy   BRONCHITIS 03/10/2009   Qualifier: Diagnosis of  By: Geoffry Paradise MD, Kristine Royal    Chest pain with moderate risk for cardiac etiology 04/28/2015   Dehydration 06/18/2009   Qualifier: Diagnosis of  By: Pete Pelt     Depression    Family history of breast cancer    Family history of colon cancer    Fibromyalgia 2010   Hyperlipidemia    Hypothyroidism (acquired) 2006   s/p thyroidectomy for nodules   Morbid obesity ([place]) 08/10/2015   Obesity    gastric bpg 2011, weight then 400 lbs, lost 100 lbs in 60 days.   Obstructive sleep apnea  does not use cpap, does not have a following physician   Paroxysmal atrial fibrillation ([place]) 08/10/2015   Past Surgical History:  Procedure Laterality Date   ABDOMINAL HYSTERECTOMY  1992   total.    APPENDECTOMY     BREAST SURGERY  2019   right lumpectomy    CHOLECYSTECTOMY     COLONOSCOPY     ELECTROPHYSIOLOGIC STUDY N/A 09/08/2015   Procedure: Atrial Fibrillation Ablation;  Surgeon: [name], MD;  Location: [place] [facility];  Service: Cardiovascular;  Laterality: N/A;   EP IMPLANTABLE DEVICE N/A 12/17/2015   Procedure: Loop Recorder Insertion;  Surgeon: [name], MD;  Location: [place] [facility];  Service: Cardiovascular;  Laterality: N/A;   ESOPHAGOGASTRODUODENOSCOPY     EXPLORATORY LAPAROTOMY WITH ABDOMINAL MASS EXCISION  2018   GASTRIC BYPASS  2011   lost 100 lbs, but gained some back.   implantable loop  recorder removal  03/25/2020   removed 2022   THYROIDECTOMY  2006   for nodules ( hot and cold nodules) Goiter compressing trachea.    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 Works as Technical brewer for [employer].   Right handed   Social Determinants of Health   Financial Resource Strain: Not on file  Food Insecurity: Not on file  Transportation Needs: Not on file  Physical Activity: Not on file  Stress: Not on file  Social Connections: Not on file    Review of Systems  Constitutional:  Negative for chills, fatigue and fever.  HENT:  Negative for congestion, ear pain and sore throat.   Respiratory:  Negative for cough and shortness of breath.   Cardiovascular:  Negative for chest pain and palpitations.  Gastrointestinal:  Negative for abdominal pain, constipation, diarrhea, nausea and vomiting.  Endocrine: Negative for polydipsia, polyphagia and polyuria.  Genitourinary:  Negative for difficulty urinating and dysuria.  Musculoskeletal:  Positive for arthralgias (Left arm pain and left leg pain). Negative for back pain  and myalgias.  Skin:  Negative for rash.  Neurological:  Negative for headaches.  Psychiatric/Behavioral:  Negative for dysphoric mood. The patient is not nervous/anxious.      Objective:  BP 110/60   Pulse 95   Temp 97.6 F (36.4 C)   Resp 15   Ht '5\' 7"'$  (1.702 m)   Wt 259 lb (117.5 kg)   SpO2 90%   BMI  40.57 kg/m      01/31/2022    9:51 AM 01/03/2022    2:34 PM 12/27/2021   11:59 AM  BP/Weight  Systolic BP 193 790 240  Diastolic BP 60 74 68  Wt. (Lbs) 259 258.7 254.8  BMI 40.57 kg/m2 40.52 kg/m2 39.91 kg/m2    Physical Exam Vitals reviewed.  Constitutional:      Appearance: Normal appearance. She is obese.  Neck:     Vascular: No carotid bruit.  Cardiovascular:     Rate and Rhythm: Normal rate and regular rhythm.     Heart sounds: Normal heart sounds.  Pulmonary:     Effort: Pulmonary effort is normal. No respiratory distress.     Breath sounds: Normal breath sounds.  Abdominal:     General: Abdomen is flat. Bowel sounds are normal.     Palpations: Abdomen is soft.     Tenderness: There is no abdominal tenderness.  Musculoskeletal:        General: Tenderness (Lt anterior shoulder. Limted ROM of left shoulder.) present.  Neurological:     Mental Status: She is alert and oriented to person, place, and time.  Psychiatric:        Mood and Affect: Mood normal.        Behavior: Behavior normal.     Diabetic Foot Exam - Simple   No data filed      Lab Results  Component Value Date   WBC 8.1 01/03/2022   HGB 10.3 (A) 01/03/2022   HCT 33 (A) 01/03/2022   PLT 239 01/03/2022   GLUCOSE 84 10/21/2021   CHOL 136 10/21/2021   TRIG 122 10/21/2021   HDL 46 10/21/2021   LDLCALC 68 10/21/2021   ALT 12 01/03/2022   AST 16 01/03/2022   NA 139 01/03/2022   K 4.2 01/03/2022   CL 110 (A) 01/03/2022   CREATININE 0.8 01/03/2022   BUN 21 01/03/2022   CO2 25 (A) 01/03/2022   TSH 1.581 01/20/2021      Assessment & Plan:   [place]: dietician.  Anemia: what we  need to do about it.    Problem List Items Addressed This Visit       Nervous and Auditory   Back pain of lumbar region with sciatica - Primary    Order lumbar mri.  Follow up with specialist.      Relevant Medications   methocarbamol (ROBAXIN) 500 MG tablet   triamcinolone acetonide (KENALOG-40) injection 40 mg   Other Relevant Orders   MR Lumbar Spine Wo Contrast     Other   Mixed hyperlipidemia    The current medical regimen is effective;  continue present plan and medications.       Relevant Medications   furosemide (LASIX) 20 MG tablet   Other Relevant Orders   Comprehensive metabolic panel   Anemia of chronic disease    Check cbc and iron studies.      Relevant Orders   CBC with Differential/Platelet   Iron, TIBC and Ferritin Panel   Acute pain of left shoulder    Risks were discussed including bleeding, infection, increase in sugars if diabetic, atrophy at site of injection, and increased pain.  After consent was obtained, using sterile technique the Left shoulder was prepped with alcohol.  The joint was entered posteriorly and  Kenalog 40 mg and 5 ml plain Lidocaine was then injected and the needle withdrawn.  The procedure was well tolerated.   The patient is asked to continue  to rest the joint for a few more days before resuming regular activities.  It may be more painful for the first 1-2 days.  Watch for fever, or increased swelling or persistent pain in the joint. Call or return to clinic prn if such symptoms occur or there is failure to improve as anticipated.      Relevant Medications   triamcinolone acetonide (KENALOG-40) injection 40 mg   Pedal edema    Start lasix 20 mg daily as needed.       Relevant Medications   furosemide (LASIX) 20 MG tablet  .  Meds ordered this encounter  Medications   furosemide (LASIX) 20 MG tablet    Sig: Take 1 tablet (20 mg total) by mouth daily as needed for edema.    Dispense:  30 tablet    Refill:  0    triamcinolone acetonide (KENALOG-40) injection 40 mg    Orders Placed This Encounter  Procedures   MR Lumbar Spine Wo Contrast   CBC with Differential/Platelet   Iron, TIBC and Ferritin Panel   Comprehensive metabolic panel     Follow-up: Return in about 3 months (around 05/02/2022) for chronic fasting.  An After Visit Summary was printed and given to the patient.  Rochel Brome, MD [facility] [phone]

## 2022-01-31 NOTE — Assessment & Plan Note (Signed)
Start lasix 20 mg daily as needed.

## 2022-01-31 NOTE — Assessment & Plan Note (Signed)
Risks were discussed including bleeding, infection, increase in sugars if diabetic, atrophy at site of injection, and increased pain.  After consent was obtained, using sterile technique the Left shoulder was prepped with alcohol.  The joint was entered posteriorly and  Kenalog 40 mg and 5 ml plain Lidocaine was then injected and the needle withdrawn.  The procedure was well tolerated.   The patient is asked to continue to rest the joint for a few more days before resuming regular activities.  It may be more painful for the first 1-2 days.  Watch for fever, or increased swelling or persistent pain in the joint. Call or return to clinic prn if such symptoms occur or there is failure to improve as anticipated.

## 2022-01-31 NOTE — Assessment & Plan Note (Signed)
The current medical regimen is effective;  continue present plan and medications.  

## 2022-01-31 NOTE — Assessment & Plan Note (Addendum)
Order lumbar mri.  Follow up with specialist.

## 2022-02-01 LAB — CBC WITH DIFFERENTIAL/PLATELET
Basophils Absolute: 0.1 10*3/uL (ref 0.0–0.2)
Basos: 1 %
EOS (ABSOLUTE): 0.2 10*3/uL (ref 0.0–0.4)
Eos: 2 %
Hematocrit: 36.5 % (ref 34.0–46.6)
Hemoglobin: 11.1 g/dL (ref 11.1–15.9)
Immature Grans (Abs): 0 10*3/uL (ref 0.0–0.1)
Immature Granulocytes: 0 %
Lymphocytes Absolute: 1.5 10*3/uL (ref 0.7–3.1)
Lymphs: 21 %
MCH: 28 pg (ref 26.6–33.0)
MCHC: 30.4 g/dL — ABNORMAL LOW (ref 31.5–35.7)
MCV: 92 fL (ref 79–97)
Monocytes Absolute: 0.4 10*3/uL (ref 0.1–0.9)
Monocytes: 6 %
Neutrophils Absolute: 4.9 10*3/uL (ref 1.4–7.0)
Neutrophils: 70 %
Platelets: 295 10*3/uL (ref 150–450)
RBC: 3.96 x10E6/uL (ref 3.77–5.28)
RDW: 16.2 % — ABNORMAL HIGH (ref 11.7–15.4)
WBC: 7.1 10*3/uL (ref 3.4–10.8)

## 2022-02-01 LAB — COMPREHENSIVE METABOLIC PANEL
ALT: 10 IU/L (ref 0–32)
AST: 8 IU/L (ref 0–40)
Albumin/Globulin Ratio: 1.3 (ref 1.2–2.2)
Albumin: 3.3 g/dL — ABNORMAL LOW (ref 3.9–4.9)
Alkaline Phosphatase: 113 IU/L (ref 44–121)
BUN/Creatinine Ratio: 18 (ref 12–28)
BUN: 16 mg/dL (ref 8–27)
Bilirubin Total: 0.3 mg/dL (ref 0.0–1.2)
CO2: 25 mmol/L (ref 20–29)
Calcium: 8.7 mg/dL (ref 8.7–10.3)
Chloride: 106 mmol/L (ref 96–106)
Creatinine, Ser: 0.9 mg/dL (ref 0.57–1.00)
Globulin, Total: 2.5 g/dL (ref 1.5–4.5)
Glucose: 85 mg/dL (ref 70–99)
Potassium: 4.5 mmol/L (ref 3.5–5.2)
Sodium: 142 mmol/L (ref 134–144)
Total Protein: 5.8 g/dL — ABNORMAL LOW (ref 6.0–8.5)
eGFR: 71 mL/min/{1.73_m2} (ref >59)

## 2022-02-01 LAB — IRON,TIBC AND FERRITIN PANEL
Ferritin: 14 ng/mL — ABNORMAL LOW (ref 15–150)
Iron Saturation: 18 % (ref 15–55)
Iron: 48 ug/dL (ref 27–139)
Total Iron Binding Capacity: 272 ug/dL (ref 250–450)
UIBC: 224 ug/dL (ref 118–369)

## 2022-02-08 ENCOUNTER — Encounter: Payer: PPO | Admitting: Family Medicine

## 2022-02-12 ENCOUNTER — Ambulatory Visit (HOSPITAL_COMMUNITY)
Admission: RE | Admit: 2022-02-12 | Discharge: 2022-02-12 | Disposition: A | Discharge status: Home / Self Care | Payer: PPO | Source: Ambulatory Visit | Attending: Family Medicine | Admitting: Family Medicine

## 2022-02-12 DIAGNOSIS — M4316 Spondylolisthesis, lumbar region: Secondary | ICD-10-CM | POA: Diagnosis not present

## 2022-02-12 DIAGNOSIS — M545 Low back pain, unspecified: Secondary | ICD-10-CM | POA: Diagnosis not present

## 2022-02-12 DIAGNOSIS — M5440 Lumbago with sciatica, unspecified side: Secondary | ICD-10-CM | POA: Insufficient documentation

## 2022-02-15 DIAGNOSIS — M47816 Spondylosis without myelopathy or radiculopathy, lumbar region: Secondary | ICD-10-CM | POA: Diagnosis not present

## 2022-02-19 ENCOUNTER — Other Ambulatory Visit: Payer: Self-pay | Admitting: Family Medicine

## 2022-03-11 ENCOUNTER — Other Ambulatory Visit: Payer: Self-pay | Admitting: Internal Medicine

## 2022-03-15 ENCOUNTER — Other Ambulatory Visit: Payer: Self-pay | Admitting: Family Medicine

## 2022-03-15 DIAGNOSIS — S8002XA Contusion of left knee, initial encounter: Secondary | ICD-10-CM

## 2022-03-16 ENCOUNTER — Telehealth: Payer: Self-pay | Admitting: Pharmacist

## 2022-03-16 NOTE — Telephone Encounter (Signed)
PA for [place].  Key: [id number].  Prior Authorization duplicate/approved

## 2022-03-22 ENCOUNTER — Telehealth: Payer: Self-pay

## 2022-03-22 NOTE — Telephone Encounter (Signed)
MYCHART MESSAGE SENT TO PATIENT FOR APPT

## 2022-03-24 ENCOUNTER — Encounter: Payer: Self-pay | Admitting: Family Medicine

## 2022-03-24 ENCOUNTER — Ambulatory Visit (INDEPENDENT_AMBULATORY_CARE_PROVIDER_SITE_OTHER): Payer: PPO | Admitting: Family Medicine

## 2022-03-24 VITALS — BP 120/82 | HR 97 | Temp 97.4°F | Ht 67.0 in | Wt 257.6 lb

## 2022-03-24 DIAGNOSIS — Z0000 Encounter for general adult medical examination without abnormal findings: Secondary | ICD-10-CM | POA: Diagnosis not present

## 2022-03-24 DIAGNOSIS — M67912 Unspecified disorder of synovium and tendon, left shoulder: Secondary | ICD-10-CM | POA: Diagnosis not present

## 2022-03-24 DIAGNOSIS — Z23 Encounter for immunization: Secondary | ICD-10-CM | POA: Diagnosis not present

## 2022-03-24 MED ORDER — DULOXETINE HCL 60 MG PO CPEP: 60.0000 mg | ORAL_CAPSULE | Freq: Every day | ORAL | 0 refills | Status: DC

## 2022-03-24 NOTE — Progress Notes (Signed)
Subjective:    Monique Kelly is a 66 y.o. female who presents for a Welcome to Medicare exam.  Complaining of left mid upper arm pain since completed radiogrequency spinal ablation on October 26th. Does not recall laying in awkward position during procedure. No injuries. Never had previously. Give Left shoulder injection in September which did not help.  Review of Systems  Constitutional:  Positive for malaise/fatigue. Negative for chills and fever.  HENT:  Negative for congestion, ear pain, sinus pain and sore throat.   Respiratory:  Negative for cough and shortness of breath.   Cardiovascular:  Positive for palpitations (occasional feels like heart flutters and is going in and out of atrial fibrillation. Takes metoprolol prn for palpitations. Almost fainted 1 week ago.). Negative for chest pain.  Gastrointestinal:  Negative for constipation, diarrhea, melena, nausea and vomiting.  Musculoskeletal:  Positive for back pain (30 % improvement.). Negative for myalgias.  Neurological:  Positive for focal weakness (left leg due to back.). Negative for headaches.  Psychiatric/Behavioral:  Positive for depression. The patient is not nervous/anxious.       03/24/2022    2:13 PM 08/04/2021   11:49 AM 03/19/2021   11:02 AM  PHQ9 SCORE ONLY  PHQ-9 Total Score 9 0 11   Functional Status Survey: Is the patient deaf or have difficulty hearing?: No Does the patient have difficulty seeing, even when wearing glasses/contacts?: No Does the patient have difficulty concentrating, remembering, or making decisions?: Yes Does the patient have difficulty walking or climbing stairs?: Yes Does the patient have difficulty dressing or bathing?: No Does the patient have difficulty doing errands alone such as visiting a doctor's office or shopping?: No       Objective:    Today's Vitals   03/24/22 1400 03/24/22 1403  BP: 120/82   Pulse: 97   Temp: (!) 97.4 F (36.3 C)   SpO2: 94%   Weight: 257 lb  9.6 oz (116.8 kg)   Height: '5\' 7"'$  (1.702 m)   PainSc:  8   Body mass index is 40.35 kg/m.  Physical Exam Vitals reviewed.  Constitutional:      Appearance: Normal appearance. She is obese.  Cardiovascular:     Rate and Rhythm: Normal rate and regular rhythm.     Heart sounds: Normal heart sounds.  Pulmonary:     Effort: Pulmonary effort is normal. No respiratory distress.     Breath sounds: Normal breath sounds.  Musculoskeletal:     Comments: LEFT SHOULDER EXAM TENDER: diffuse POOR ROM ABNORMAL ABDUCTION: LIMITED PAINFUL EXTERNAL ROTATION:LIMITED PAINFUL INTERNAL ROTATION:LIMITED PAINFUL EMPTY CAN SIGN: POSITIVE  Neurological:     Mental Status: She is alert and oriented to person, place, and time.  Psychiatric:        Mood and Affect: Mood normal.        Behavior: Behavior normal.      Medications Outpatient Encounter Medications as of 03/24/2022  Medication Sig   buPROPion (WELLBUTRIN XL) 150 MG 24 hr tablet Take 150 mg by mouth daily.   buPROPion (WELLBUTRIN XL) 300 MG 24 hr tablet Take 300 mg by mouth daily.   calcium carbonate (OS-CAL - DOSED IN MG OF ELEMENTAL CALCIUM) 1250 (500 Ca) MG tablet Take 1 tablet by mouth daily with breakfast.   cyanocobalamin 1000 MCG tablet Take by mouth.   DULoxetine (CYMBALTA) 60 MG capsule Take 1 capsule (60 mg total) by mouth at bedtime.   ergocalciferol (VITAMIN D2) 1.25 MG (50000 UT) capsule Take  by mouth.   Evolocumab (REPATHA SURECLICK) 876 MG/ML SOAJ Inject 140 mg into the skin every 14 (fourteen) days.   furosemide (LASIX) 20 MG tablet Take 1 tablet (20 mg total) by mouth daily as needed for edema.   gabapentin (NEURONTIN) 600 MG tablet Take 1 tablet (600 mg total) by mouth 3 (three) times daily.   letrozole (FEMARA) 2.5 MG tablet TAKE ONE TABLET BY MOUTH EVERY DAY   levothyroxine (SYNTHROID) 75 MCG tablet Take 75 mcg by mouth daily.    methocarbamol (ROBAXIN) 500 MG tablet Take by mouth.   metoprolol tartrate (LOPRESSOR) 25  MG tablet Take 1 tablet (25 mg total) by mouth daily as needed (PALPITATION/ AFIB).   naproxen (NAPROSYN) 500 MG tablet Take 1 tablet (500 mg total) by mouth 2 (two) times daily with a meal.   omeprazole (PRILOSEC) 40 MG capsule Take 1 capsule (40 mg total) by mouth in the morning and at bedtime.   Zoledronic Acid (RECLAST IV) Inject into the vein.   [DISCONTINUED] traMADol (ULTRAM) 50 MG tablet Take 1 tablet by mouth at bedtime as needed. pain (Patient not taking: Reported on 03/24/2022)   Facility-Administered Encounter Medications as of 03/24/2022  Medication   triamcinolone acetonide (KENALOG-40) injection 40 mg     History: Past Medical History:  Diagnosis Date   Atrial fibrillation [facility]) 2016   Sees Dr. [name]. Cardiac ablation, loop recorder (removed in 2022)   Atrial fibrillation with rapid ventricular response ([place]) 04/2015   Breast cancer ([place])    right lumpectomy   BRONCHITIS 03/10/2009   Qualifier: Diagnosis of  By: Geoffry Paradise MD, Kristine Royal    Chest pain with moderate risk for cardiac etiology 04/28/2015   Dehydration 06/18/2009   Qualifier: Diagnosis of  By: Pete Pelt     Depression    Family history of breast cancer    Family history of colon cancer    Fibromyalgia 2010   Hyperlipidemia    Hypothyroidism (acquired) 2006   s/p thyroidectomy for nodules   Morbid obesity ([place]) 08/10/2015   Obesity    gastric bpg 2011, weight then 400 lbs, lost 100 lbs in 60 days.   Obstructive sleep apnea    does not use cpap, does not have a following physician   Paroxysmal atrial fibrillation ([place]) 08/10/2015   Past Surgical History:  Procedure Laterality Date   ABDOMINAL HYSTERECTOMY  1992   total.    APPENDECTOMY     BREAST SURGERY  2019   right lumpectomy    CHOLECYSTECTOMY     COLONOSCOPY     ELECTROPHYSIOLOGIC STUDY N/A 09/08/2015   Procedure: Atrial Fibrillation Ablation;  Surgeon: [name], MD;  Location: [place] [facility];  Service: Cardiovascular;   Laterality: N/A;   EP IMPLANTABLE DEVICE N/A 12/17/2015   Procedure: Loop Recorder Insertion;  Surgeon: [name], MD;  Location: [place] [facility];  Service: Cardiovascular;  Laterality: N/A;   ESOPHAGOGASTRODUODENOSCOPY     EXPLORATORY LAPAROTOMY WITH ABDOMINAL MASS EXCISION  2018   GASTRIC BYPASS  2011   lost 100 lbs, but gained some back.   implantable loop recorder removal  03/25/2020   removed 2022   THYROIDECTOMY  2006   for nodules ( hot and cold nodules) Goiter compressing trachea.    Family History  Problem Relation Age of Onset   Heart failure Father    Cancer Father        colon   Heart failure Maternal Grandmother    Heart failure  Maternal Grandfather    Cancer Mother        breast   Social History   Occupational History   Occupation: Armed forces training and education officer  Tobacco Use   Smoking status: Never   Smokeless tobacco: Never  Vaping Use   Vaping Use: Never used  Substance and Sexual Activity   Alcohol use: Yes    Alcohol/week: 1.0 standard drink of alcohol    Types: 1 Standard drinks or equivalent per week    Comment: rare glass of wine   Drug use: No   Sexual activity: Not Currently    Tobacco Counseling Counseling given: Not Answered   Immunizations and Health Maintenance Immunization History  Administered Date(s) Administered   Fluad Quad(high Dose 65+) 03/24/2022   Influenza Inj Mdck Quad Pf 02/02/2021   Influenza,inj,Quad PF,6+ Mos 02/23/2019   Janssen (J&J) SARS-COV-2 Vaccination 08/14/2019   Pfizer Covid-19 Vaccine Bivalent Booster 39yr & up 03/19/2021   Health Maintenance Due  Topic Date Due   Zoster Vaccines- Shingrix (1 of 2) Never done   PAP SMEAR-Modifier  Never done   COVID-19 Vaccine (3 - Janssen risk series) 05/14/2021   Pneumonia Vaccine 65 Years old (1 - PCV) Never done    Activities of Daily Living    03/24/2022    2:44 PM  In your present state of health, do you have any difficulty performing the following activities:   Hearing? 0  Vision? 0  Difficulty concentrating or making decisions? 1  Walking or climbing stairs? 1  Dressing or bathing? 0  Doing errands, shopping? 0  Preparing Food and eating ? N  Using the Toilet? N  In the past six months, have you accidently leaked urine? N  Do you have problems with loss of bowel control? N  Managing your Medications? N  Managing your Finances? N  Housekeeping or managing your Housekeeping? N    Physical Exam    Advanced Directives: Does Patient Have a Medical Advance Directive?: Yes Type of Advance Directive: [place] want to make changes to medical advance directive?: No - Patient declined Copy of [place] Chart?: No - copy requested    Assessment:    This is a routine wellness examination for this patient .   Vision/Hearing screen No results found.  Dietary issues and exercise activities discussed:  Current Exercise Habits: The patient does not participate in regular exercise at present, Exercise limited by: orthopedic condition(s)   Goals   None   Depression Screen    03/24/2022    2:13 PM 08/04/2021   11:49 AM 03/19/2021   11:02 AM 02/02/2021    8:11 AM  PHQ 2/9 Scores  PHQ - 2 Score 2 0 3 2  PHQ- 9 Score '9  11 12     '$ Fall Risk    03/24/2022    2:37 PM  Fall Risk   Comment fell and hit her face on one of them. 2-3 months ago. no loc.  Risk for fall due to : Impaired balance/gait;Impaired mobility  Follow up Falls prevention discussed    Cognitive Function:    03/24/2022    3:19 PM  MMSE - Mini Mental State Exam  Orientation to time 5  Orientation to Place 5  Registration 3  Attention/ Calculation 5  Recall 2  Language- name 2 objects 2  Language- repeat 1  Language- follow 3 step command 3  Language- read & follow direction 1  Write a sentence  1  Copy design 0  Total score 28        Patient Care Team: Rochel Brome, MD as PCP - General (Family  Medicine) [name], MD as PCP - Electrophysiology (Cardiology) [name], MD as Consulting Physician (Cardiology) Derwood Kaplan, MD as Consulting Physician (Oncology) Pollyann Samples, MD as Consulting Physician (General Surgery) Misenheimer, Christia Reading, MD as Consulting Physician (Unknown Physician Specialty) Oneal Grout, DDS as Referring Physician (Orthodontics) Noberto Retort, Juanda Bond., MD as Referring Physician (Surgery)     Plan:    Problem List Items Addressed This Visit       Musculoskeletal and Integument   Disorder of left rotator cuff    Refer to [place] for left shoulder impingement/tear of rotator cuff.        Relevant Orders   AMB referral to orthopedics     Other   Encounter for Medicare annual wellness exam - Primary     Things to do to keep yourself healthy  - Exercise at least 30-45 minutes a day, 3-4 days a week.  - Eat a low-fat diet with lots of fruits and vegetables, up to 7-9 servings per day.  - Seatbelts can save your life. Wear them always.  - Smoke detectors on every level of your home, check batteries every year.  - Eye Doctor - have an eye exam every 1-2 years  - [place]. Choose someone to speak for you if you are not able. Please bring [place] a copy. - Depression is common in our stressful world.If you're feeling down or losing interest in things you normally enjoy, please come in for a visit.        Need for immunization against influenza   Relevant Orders   Flu Vaccine QUAD High Dose(Fluad) (Completed)     I have personally reviewed and noted the following in the patient's chart:   Medical and social history Use of alcohol, tobacco or illicit drugs  Current medications and supplements Functional ability and status Nutritional status Physical activity Advanced directives List of other physicians Hospitalizations, surgeries, and ER visits in previous 12 months Vitals Screenings to include  cognitive, depression, and falls Referrals and appointments  In addition, I have reviewed and discussed with patient certain preventive protocols, quality metrics, and best practice recommendations. A written personalized care plan for preventive services as well as general preventive health recommendations were provided to patient.     Rochel Brome, MD 03/27/2022

## 2022-03-24 NOTE — Patient Instructions (Addendum)
  Monique Kelly , Thank you for taking time to come for your Medicare Wellness Visit. I appreciate your ongoing commitment to your health goals. Please review the following plan we discussed and let me know if I can assist you in the future.     This is a list of the screening recommended for you and due dates:  Health Maintenance  Topic Date Due   Zoster (Shingles) Vaccine (1 of 2) Never done   Pap Smear  Never done   COVID-19 Vaccine (3 - Janssen risk series) 05/14/2021   Pneumonia Vaccine (1 - PCV) Never done   Flu Shot  12/21/2021   Tetanus Vaccine  05/28/2022*   Medicare Annual Wellness Visit  03/25/2023   Mammogram  06/29/2023   Colon Cancer Screening  07/08/2026   DEXA scan (bone density measurement)  Completed   HPV Vaccine  Aged Out   Hepatitis C Screening: USPSTF Recommendation to screen - Ages 70-79 yo.  Discontinued   HIV Screening  Discontinued  *Topic was postponed. The date shown is not the original due date.     Things to do to keep yourself healthy  - Exercise at least 30-45 minutes a day, 3-4 days a week.  - Eat a low-fat diet with lots of fruits and vegetables, up to 7-9 servings per day.  - Seatbelts can save your life. Wear them always.  - Smoke detectors on every level of your home, check batteries every year.  - Eye Doctor - have an eye exam every 1-2 years  - [place]. Choose someone to speak for you if you are not able. Please bring [place] a copy. - Depression is common in our stressful world.If you're feeling down or losing interest in things you normally enjoy, please come in for a visit.   Start on duloxetine 60 mg before bed.  Refer to [place] for left shoulder impingement/tear of rotator cuff.

## 2022-03-27 DIAGNOSIS — Z0000 Encounter for general adult medical examination without abnormal findings: Secondary | ICD-10-CM | POA: Insufficient documentation

## 2022-03-27 DIAGNOSIS — Z23 Encounter for immunization: Secondary | ICD-10-CM | POA: Insufficient documentation

## 2022-03-27 DIAGNOSIS — M67912 Unspecified disorder of synovium and tendon, left shoulder: Secondary | ICD-10-CM | POA: Insufficient documentation

## 2022-03-27 NOTE — Assessment & Plan Note (Signed)
Refer to [place] for left shoulder impingement/tear of rotator cuff.

## 2022-03-27 NOTE — Assessment & Plan Note (Signed)
  Things to do to keep yourself healthy  - Exercise at least 30-45 minutes a day, 3-4 days a week.  - Eat a low-fat diet with lots of fruits and vegetables, up to 7-9 servings per day.  - Seatbelts can save your life. Wear them always.  - Smoke detectors on every level of your home, check batteries every year.  - Eye Doctor - have an eye exam every 1-2 years  - [place]. Choose someone to speak for you if you are not able. Please bring [place] a copy. - Depression is common in our stressful world.If you're feeling down or losing interest in things you normally enjoy, please come in for a visit.

## 2022-03-28 ENCOUNTER — Telehealth: Payer: Self-pay

## 2022-03-28 NOTE — Telephone Encounter (Signed)
Patient called stating that she wanted to leave a message to let Dr. Tobie Poet know that she has stopped taking the Cymbalta because she tried taking the medication for one day and that it made her feel nauseous, dizzy, and sick to her stomach and didn't want to try it again but wanted you to know. I have removed it from patient's medication list.

## 2022-05-10 ENCOUNTER — Encounter: Payer: Self-pay | Admitting: Family Medicine

## 2022-05-10 ENCOUNTER — Ambulatory Visit (INDEPENDENT_AMBULATORY_CARE_PROVIDER_SITE_OTHER): Payer: PPO | Admitting: Family Medicine

## 2022-05-10 VITALS — BP 132/74 | HR 86 | Temp 97.1°F | Ht 67.0 in | Wt 253.0 lb

## 2022-05-10 DIAGNOSIS — E782 Mixed hyperlipidemia: Secondary | ICD-10-CM

## 2022-05-10 DIAGNOSIS — E039 Hypothyroidism, unspecified: Secondary | ICD-10-CM | POA: Diagnosis not present

## 2022-05-10 DIAGNOSIS — K219 Gastro-esophageal reflux disease without esophagitis: Secondary | ICD-10-CM

## 2022-05-10 DIAGNOSIS — G4733 Obstructive sleep apnea (adult) (pediatric): Secondary | ICD-10-CM

## 2022-05-10 DIAGNOSIS — M25512 Pain in left shoulder: Secondary | ICD-10-CM | POA: Diagnosis not present

## 2022-05-10 DIAGNOSIS — F32A Depression, unspecified: Secondary | ICD-10-CM

## 2022-05-10 DIAGNOSIS — F331 Major depressive disorder, recurrent, moderate: Secondary | ICD-10-CM

## 2022-05-10 DIAGNOSIS — M545 Low back pain, unspecified: Secondary | ICD-10-CM

## 2022-05-10 DIAGNOSIS — M791 Myalgia, unspecified site: Secondary | ICD-10-CM

## 2022-05-10 NOTE — Progress Notes (Signed)
Subjective:  Patient ID: Monique Kelly, female    DOB: 02/21/1956  Age: 66 y.o. MRN: [id number]  Chief Complaint  Patient presents with   Depression   Gastroesophageal Reflux   Hyperlipidemia   Hypothyroidism    HPI GERD: Taking  omeprazole 40 mg daily  HTN: Taking Metoprolol 25 mg daily  Hypothyroidism: Takes synthroid 75 mcg daily Hyperlipidemia: on repatha 140 mg IM every 2 weeks.  Depression: Taking wellbutrin 450 mg daily  Lumbar back pain: spinal ablation did not help. Patient saw Dr. Posey Pronto at the pain clinic in [facility]. She has not gone back because she did not get phone responses and then when she returned, she did not feel like they listened. Patient does not wish to be on narcotics.  Patient has run out of naproxen.. Patient is unable to pay for meds if it is the middle of the month. She has to wait until the next pay check. Unable to afford copays.   Fell twice on Monday. Carrying groceries in the house and missed a step and "face planted." Then fell in the house and hit your head on the door frame.  Unable to get up without assistance of a chair or person.   Patient reports she hurts all over.   Patient Care Team: Rochel Brome, MD as PCP - General (Family Medicine) [name], MD as PCP - Electrophysiology (Cardiology) [name], MD as Consulting Physician (Cardiology) Derwood Kaplan, MD as Consulting Physician (Oncology) Pollyann Samples, MD as Consulting Physician (General Surgery) Misenheimer, Christia Reading, MD as Consulting Physician (Unknown Physician Specialty) Oneal Grout, DDS as Referring Physician (Orthodontics) Lininger, Juanda Bond., MD as Referring Physician (Surgery)  Patient has seen [facility]. Received trochanteric bursitis BL. Helped for about 2 months.    Current Outpatient Medications on File Prior to Visit  Medication Sig Dispense Refill   buPROPion (WELLBUTRIN XL) 150 MG 24 hr tablet Take 150 mg by mouth daily.     buPROPion  (WELLBUTRIN XL) 300 MG 24 hr tablet Take 300 mg by mouth daily.     calcium carbonate (OS-CAL - DOSED IN MG OF ELEMENTAL CALCIUM) 1250 (500 Ca) MG tablet Take 1 tablet by mouth daily with breakfast.     cyanocobalamin 1000 MCG tablet Take by mouth.     ergocalciferol (VITAMIN D2) 1.25 MG (50000 UT) capsule Take by mouth.     Evolocumab (REPATHA SURECLICK) 151 MG/ML SOAJ Inject 140 mg into the skin every 14 (fourteen) days. 2 mL 12   furosemide (LASIX) 20 MG tablet Take 1 tablet (20 mg total) by mouth daily as needed for edema. 30 tablet 0   gabapentin (NEURONTIN) 600 MG tablet Take 1 tablet (600 mg total) by mouth 3 (three) times daily. 270 tablet 0   letrozole (FEMARA) 2.5 MG tablet TAKE ONE TABLET BY MOUTH EVERY DAY 90 tablet 3   levothyroxine (SYNTHROID) 75 MCG tablet Take 75 mcg by mouth daily.      metoprolol tartrate (LOPRESSOR) 25 MG tablet Take 1 tablet (25 mg total) by mouth daily as needed (PALPITATION/ AFIB). 90 tablet 0   naproxen (NAPROSYN) 500 MG tablet Take 1 tablet (500 mg total) by mouth 2 (two) times daily with a meal. 60 tablet 2   Zoledronic Acid (RECLAST IV) Inject into the vein.     No current facility-administered medications on file prior to visit.   Past Medical History:  Diagnosis Date   Atrial fibrillation [facility]) 2016   Sees Dr. [name].  Cardiac ablation, loop recorder (removed in 2022)   Atrial fibrillation with rapid ventricular response ([place]) 04/2015   Breast cancer ([place])    right lumpectomy   BRONCHITIS 03/10/2009   Qualifier: Diagnosis of  By: Geoffry Paradise MD, Kristine Royal    Chest pain with moderate risk for cardiac etiology 04/28/2015   Dehydration 06/18/2009   Qualifier: Diagnosis of  By: Pete Pelt     Depression    Family history of breast cancer    Family history of colon cancer    Fibromyalgia 2010   Hyperlipidemia    Hypothyroidism (acquired) 2006   s/p thyroidectomy for nodules   Morbid obesity ([place]) 08/10/2015   Obesity    gastric bpg 2011,  weight then 400 lbs, lost 100 lbs in 60 days.   Obstructive sleep apnea    does not use cpap, does not have a following physician   Paroxysmal atrial fibrillation ([place]) 08/10/2015   Past Surgical History:  Procedure Laterality Date   ABDOMINAL HYSTERECTOMY  1992   total.    APPENDECTOMY     BREAST SURGERY  2019   right lumpectomy    CHOLECYSTECTOMY     COLONOSCOPY     ELECTROPHYSIOLOGIC STUDY N/A 09/08/2015   Procedure: Atrial Fibrillation Ablation;  Surgeon: [name], MD;  Location: [facility];  Service: Cardiovascular;  Laterality: N/A;   EP IMPLANTABLE DEVICE N/A 12/17/2015   Procedure: Loop Recorder Insertion;  Surgeon: [name], MD;  Location: [facility];  Service: Cardiovascular;  Laterality: N/A;   ESOPHAGOGASTRODUODENOSCOPY     EXPLORATORY LAPAROTOMY WITH ABDOMINAL MASS EXCISION  2018   GASTRIC BYPASS  2011   lost 100 lbs, but gained some back.   implantable loop recorder removal  03/25/2020   removed 2022   NECK SURGERY  2005   laminectomy?   THYROIDECTOMY  2006   for nodules ( hot and cold nodules) Goiter compressing trachea.    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 Works as Technical brewer for [employer].   Right handed   Social Determinants of Health   Financial Resource Strain: Medium Risk (03/24/2022)   Overall Financial Resource Strain (CARDIA)    Difficulty of Paying Living Expenses: Somewhat hard  Food Insecurity: No Food Insecurity (03/24/2022)   Hunger Vital Sign    Worried About Running Out of Food in the Last Year:  Never true    Ran Out of Food in the Last Year: Never true  Transportation Needs: No Transportation Needs (03/24/2022)   PRAPARE - Hydrologist (Medical): No    Lack of Transportation (Non-Medical): No  Physical Activity: Inactive (03/24/2022)   Exercise Vital Sign    Days of Exercise per Week: 0 days    Minutes of Exercise per Session: 0 min  Stress: Stress Concern Present (03/24/2022)   [place]    Feeling of Stress : Rather much  Social Connections: Socially Isolated (03/24/2022)   Social Connection and Isolation Panel [NHANES]    Frequency of Communication with Friends and Family: More than three times a week    Frequency of Social Gatherings with Friends and Family: Twice a week    Attends Religious Services: Never    Marine scientist or Organizations: No    Attends Archivist Meetings: Never    Marital Status: Never married    Review of Systems  Constitutional:  Negative for chills, fatigue and fever.  HENT:  Negative for congestion, ear pain, rhinorrhea and sore throat.   Respiratory:  Negative for cough and shortness of breath.   Cardiovascular:  Negative for chest pain.  Gastrointestinal:  Negative for abdominal pain, constipation, diarrhea, nausea and vomiting.  Genitourinary:  Negative for dysuria and urgency.  Musculoskeletal:  Positive for back pain. Negative for myalgias.       Left arm pain  Neurological:   Negative for dizziness, weakness, light-headedness and headaches.  Psychiatric/Behavioral:  Negative for dysphoric mood. The patient is not nervous/anxious.      Objective:  BP 132/74   Pulse 86   Temp (!) 97.1 F (36.2 C)   Ht '5\' 7"'$  (1.702 m)   Wt 253 lb (114.8 kg)   SpO2 97%   BMI 39.63 kg/m      05/11/2022    1:26 PM 05/10/2022   10:56 AM 03/24/2022    2:00 PM  BP/Weight  Systolic BP 96 102 725  Diastolic BP 62 74 82  Wt. (Lbs) 252 253 257.6  BMI 39.47 kg/m2 39.63 kg/m2 40.35 kg/m2    Physical Exam Vitals reviewed.  Constitutional:      Appearance: Normal appearance. She is obese.  Neck:     Vascular: No carotid bruit.  Cardiovascular:     Rate and Rhythm: Normal rate and regular rhythm.     Heart sounds: Normal heart sounds.  Pulmonary:     Effort: Pulmonary effort is normal. No respiratory distress.     Breath sounds: Normal breath sounds.  Abdominal:     General: Abdomen is flat. Bowel sounds are normal.     Palpations: Abdomen is soft.     Tenderness: There is no abdominal tenderness.  Musculoskeletal:        General: Tenderness present.     Comments: Left shoulder abduction limited.  Tender over anterior left shoulder.   Neurological:     Mental Status: She is alert and oriented to person, place, and time.  Psychiatric:        Mood and Affect: Mood normal.        Behavior: Behavior normal.     Diabetic Foot Exam - Simple   No data filed      Lab Results  Component Value Date   WBC 7.1 05/10/2022   HGB 11.0 (L) 05/10/2022   HCT 35.8 05/10/2022   PLT  263 05/10/2022   GLUCOSE 96 05/10/2022   CHOL 120 05/10/2022   TRIG 88 05/10/2022   HDL 57 05/10/2022   LDLCALC 46 05/10/2022   ALT 10 05/10/2022   AST 11 05/10/2022   NA 144 05/10/2022   K 4.2 05/10/2022   CL 105 05/10/2022   CREATININE 1.02 (H) 05/10/2022   BUN 11 05/10/2022   CO2 25 05/10/2022   TSH 1.581 01/20/2021      Assessment & Plan:   Problem List Items Addressed This  Visit       Digestive   Gastroesophageal reflux disease without esophagitis    The current medical regimen is effective;  continue present plan and medications.         Endocrine   Hypothyroidism (acquired)    The current medical regimen is effective;  continue present plan and medications.          Other   Moderate recurrent major depression ([place])    The current medical regimen is somewhat effective;  continue present plan and medications. Continue wellbutrin xl 450 mg qam.        Mixed hyperlipidemia - Primary    Well controlled.  No changes to medicines. Continue repatha Continue to work on eating a healthy diet and exercise.         Relevant Orders   CBC with Differential/Platelet (Completed)   Comprehensive metabolic panel (Completed)   Lipid panel (Completed)   Cardiovascular Risk Assessment (Completed)   Lumbar back pain    Management per specialist.        Acute pain of left shoulder    Return for kenalog injection      Other Visit Diagnoses     Muscle pain       Relevant Orders   Sedimentation rate (Completed)     .  No orders of the defined types were placed in this encounter.   Orders Placed This Encounter  Procedures   CBC with Differential/Platelet   Comprehensive metabolic panel   Lipid panel   Sedimentation rate   Cardiovascular Risk Assessment     Follow-up: Return in about 1 day (around 05/11/2022) for for shoulder injection..  An After Visit Summary was printed and given to the patient.  Rochel Brome, MD [facility] [phone]

## 2022-05-11 ENCOUNTER — Encounter: Payer: Self-pay | Admitting: Family Medicine

## 2022-05-11 ENCOUNTER — Ambulatory Visit (INDEPENDENT_AMBULATORY_CARE_PROVIDER_SITE_OTHER): Payer: PPO | Admitting: Family Medicine

## 2022-05-11 VITALS — BP 96/62 | HR 52 | Temp 98.7°F | Ht 67.0 in | Wt 252.0 lb

## 2022-05-11 DIAGNOSIS — I4811 Longstanding persistent atrial fibrillation: Secondary | ICD-10-CM | POA: Diagnosis not present

## 2022-05-11 DIAGNOSIS — E782 Mixed hyperlipidemia: Secondary | ICD-10-CM | POA: Diagnosis not present

## 2022-05-11 DIAGNOSIS — E538 Deficiency of other specified B group vitamins: Secondary | ICD-10-CM

## 2022-05-11 DIAGNOSIS — M7542 Impingement syndrome of left shoulder: Secondary | ICD-10-CM | POA: Diagnosis not present

## 2022-05-11 LAB — LIPID PANEL
Chol/HDL Ratio: 2.1 ratio (ref 0.0–4.4)
Cholesterol, Total: 120 mg/dL (ref 100–199)
HDL: 57 mg/dL (ref >39)
LDL Chol Calc (NIH): 46 mg/dL (ref 0–99)
Triglycerides: 88 mg/dL (ref 0–149)
VLDL Cholesterol Cal: 17 mg/dL (ref 5–40)

## 2022-05-11 LAB — COMPREHENSIVE METABOLIC PANEL
ALT: 10 IU/L (ref 0–32)
AST: 11 IU/L (ref 0–40)
Albumin/Globulin Ratio: 1.4 (ref 1.2–2.2)
Albumin: 3.6 g/dL — ABNORMAL LOW (ref 3.9–4.9)
Alkaline Phosphatase: 112 IU/L (ref 44–121)
BUN/Creatinine Ratio: 11 — ABNORMAL LOW (ref 12–28)
BUN: 11 mg/dL (ref 8–27)
Bilirubin Total: 0.7 mg/dL (ref 0.0–1.2)
CO2: 25 mmol/L (ref 20–29)
Calcium: 9.3 mg/dL (ref 8.7–10.3)
Chloride: 105 mmol/L (ref 96–106)
Creatinine, Ser: 1.02 mg/dL — ABNORMAL HIGH (ref 0.57–1.00)
Globulin, Total: 2.5 g/dL (ref 1.5–4.5)
Glucose: 96 mg/dL (ref 70–99)
Potassium: 4.2 mmol/L (ref 3.5–5.2)
Sodium: 144 mmol/L (ref 134–144)
Total Protein: 6.1 g/dL (ref 6.0–8.5)
eGFR: 61 mL/min/{1.73_m2} (ref >59)

## 2022-05-11 LAB — CBC WITH DIFFERENTIAL/PLATELET
Basophils Absolute: 0.1 10*3/uL (ref 0.0–0.2)
Basos: 1 %
EOS (ABSOLUTE): 0.1 10*3/uL (ref 0.0–0.4)
Eos: 1 %
Hematocrit: 35.8 % (ref 34.0–46.6)
Hemoglobin: 11 g/dL — ABNORMAL LOW (ref 11.1–15.9)
Immature Grans (Abs): 0 10*3/uL (ref 0.0–0.1)
Immature Granulocytes: 0 %
Lymphocytes Absolute: 1.4 10*3/uL (ref 0.7–3.1)
Lymphs: 19 %
MCH: 26.8 pg (ref 26.6–33.0)
MCHC: 30.7 g/dL — ABNORMAL LOW (ref 31.5–35.7)
MCV: 87 fL (ref 79–97)
Monocytes Absolute: 0.4 10*3/uL (ref 0.1–0.9)
Monocytes: 6 %
Neutrophils Absolute: 5.1 10*3/uL (ref 1.4–7.0)
Neutrophils: 73 %
Platelets: 263 10*3/uL (ref 150–450)
RBC: 4.11 x10E6/uL (ref 3.77–5.28)
RDW: 16 % — ABNORMAL HIGH (ref 11.7–15.4)
WBC: 7.1 10*3/uL (ref 3.4–10.8)

## 2022-05-11 LAB — CARDIOVASCULAR RISK ASSESSMENT

## 2022-05-11 LAB — SEDIMENTATION RATE: Sed Rate: 31 mm/hr (ref 0–40)

## 2022-05-11 MED ORDER — TRIAMCINOLONE ACETONIDE 40 MG/ML IJ SUSP
80.0000 mg | Freq: Once | INTRAMUSCULAR | Status: AC
  Administered 2022-05-11: 80 mg via INTRA_ARTICULAR

## 2022-05-11 NOTE — Assessment & Plan Note (Signed)
I was concerned about bradycardia so did an EKG which showed atrial fibrillation with a rate 69. Patient was having PACs which were causing her to sound bradycardic.

## 2022-05-11 NOTE — Assessment & Plan Note (Signed)
Risks were discussed including bleeding, infection, increase in sugars if diabetic, atrophy at site of injection, and increased pain.  After consent was obtained, using sterile technique the left shoulder was prepped with alcohol.  The joint was entered posteriorly and  Kenalog 80 mg and 5 ml plain Lidocaine was then injected and the needle withdrawn.  The procedure was well tolerated.   The patient is asked to continue to rest the joint for a few more days before resuming regular activities.  It may be more painful for the first 1-2 days.  Watch for fever, or increased swelling or persistent pain in the joint. Call or return to clinic prn if such symptoms occur or there is failure to improve as anticipated.

## 2022-05-11 NOTE — Progress Notes (Signed)
Subjective:  Patient ID: Monique Kelly, female    DOB: April 06, 1956  Age: 66 y.o. MRN: [id number]  Chief Complaint  Patient presents with   Shoulder Pain   HPI: Patient presents for left shoulder injection. While doing VS she was found to be bradycardic between 46-52. Denies shortness of breath. Had some chest pain into left arm, but she clearly has shoulder impingment. Patient has fallen twice in the last week. She reports tripping.  Shoulder Pain  Pertinent negatives include no fever.     Current Outpatient Medications on File Prior to Visit  Medication Sig Dispense Refill   buPROPion (WELLBUTRIN XL) 150 MG 24 hr tablet Take 150 mg by mouth daily.     buPROPion (WELLBUTRIN XL) 300 MG 24 hr tablet Take 300 mg by mouth daily.     calcium carbonate (OS-CAL - DOSED IN MG OF ELEMENTAL CALCIUM) 1250 (500 Ca) MG tablet Take 1 tablet by mouth daily with breakfast.     cyanocobalamin 1000 MCG tablet Take by mouth.     ergocalciferol (VITAMIN D2) 1.25 MG (50000 UT) capsule Take by mouth.     Evolocumab (REPATHA SURECLICK) 242 MG/ML SOAJ Inject 140 mg into the skin every 14 (fourteen) days. 2 mL 12   furosemide (LASIX) 20 MG tablet Take 1 tablet (20 mg total) by mouth daily as needed for edema. 30 tablet 0   gabapentin (NEURONTIN) 600 MG tablet Take 1 tablet (600 mg total) by mouth 3 (three) times daily. 270 tablet 0   letrozole (FEMARA) 2.5 MG tablet TAKE ONE TABLET BY MOUTH EVERY DAY 90 tablet 3   levothyroxine (SYNTHROID) 75 MCG tablet Take 75 mcg by mouth daily.      metoprolol tartrate (LOPRESSOR) 25 MG tablet Take 1 tablet (25 mg total) by mouth daily as needed (PALPITATION/ AFIB). 90 tablet 0   naproxen (NAPROSYN) 500 MG tablet Take 1 tablet (500 mg total) by mouth 2 (two) times daily with a meal. 60 tablet 2   Zoledronic Acid (RECLAST IV) Inject into the vein.     No current facility-administered medications on file prior to visit.   Past Medical History:  Diagnosis Date   Atrial  fibrillation [facility]) 2016   Sees Dr. [name]. Cardiac ablation, loop recorder (removed in 2022)   Atrial fibrillation with rapid ventricular response ([place]) 04/2015   Breast cancer ([place])    right lumpectomy   BRONCHITIS 03/10/2009   Qualifier: Diagnosis of  By: Geoffry Paradise MD, Kristine Royal    Chest pain with moderate risk for cardiac etiology 04/28/2015   Dehydration 06/18/2009   Qualifier: Diagnosis of  By: Pete Pelt     Depression    Family history of breast cancer    Family history of colon cancer    Fibromyalgia 2010   Hyperlipidemia    Hypothyroidism (acquired) 2006   s/p thyroidectomy for nodules   Morbid obesity ([place]) 08/10/2015   Obesity    gastric bpg 2011, weight then 400 lbs, lost 100 lbs in 60 days.   Obstructive sleep apnea    does not use cpap, does not have a following physician   Paroxysmal atrial fibrillation ([place]) 08/10/2015   Past Surgical History:  Procedure Laterality Date   ABDOMINAL HYSTERECTOMY  1992   total.    APPENDECTOMY     BREAST SURGERY  2019   right lumpectomy    CHOLECYSTECTOMY     COLONOSCOPY     ELECTROPHYSIOLOGIC STUDY N/A 09/08/2015   Procedure: Atrial  Fibrillation Ablation;  Surgeon: [name], MD;  Location: [facility];  Service: Cardiovascular;  Laterality: N/A;   EP IMPLANTABLE DEVICE N/A 12/17/2015   Procedure: Loop Recorder Insertion;  Surgeon: [name], MD;  Location: [facility];  Service: Cardiovascular;  Laterality: N/A;   ESOPHAGOGASTRODUODENOSCOPY     EXPLORATORY LAPAROTOMY WITH ABDOMINAL MASS EXCISION  2018   GASTRIC BYPASS  2011   lost 100 lbs, but gained some back.   implantable loop recorder removal  03/25/2020   removed 2022   NECK SURGERY  2005   laminectomy?   THYROIDECTOMY  2006   for nodules ( hot and cold nodules) Goiter compressing trachea.    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 Works as Technical brewer for [employer].   Right handed   Social Determinants of Health   Financial Resource Strain: Medium Risk (03/24/2022)   Overall Financial Resource Strain (CARDIA)    Difficulty of Paying Living Expenses: Somewhat hard  Food Insecurity: No Food Insecurity (03/24/2022)   Hunger Vital Sign    Worried About Running Out of Food in the Last Year: Never true    [place] in the Last Year: Never true  Transportation Needs: No Transportation Needs (03/24/2022)   PRAPARE - Hydrologist (Medical): No    Lack of Transportation (Non-Medical): No  Physical Activity: Inactive (03/24/2022)   Exercise Vital Sign    Days of Exercise per Week: 0 days    Minutes of Exercise per Session: 0 min  Stress: Stress Concern Present (03/24/2022)   [place]    Feeling of Stress : Rather much  Social Connections: Socially Isolated (03/24/2022)   Social Connection and Isolation Panel [NHANES]    Frequency of Communication with Friends and Family: More than three times a week    Frequency of Social Gatherings with Friends and Family: Twice  a week    Attends Religious Services: Never    Marine scientist or Organizations: No    Attends Archivist Meetings: Never    Marital Status: Never married    Review of Systems  Constitutional:  Positive for fatigue. Negative for appetite change and fever.  HENT:  Negative for congestion, ear pain, sinus pressure and sore throat.   Respiratory:  Negative for cough,  chest tightness, shortness of breath and wheezing.   Cardiovascular:  Negative for chest pain and palpitations.  Gastrointestinal:  Negative for abdominal pain, constipation, diarrhea, nausea and vomiting.  Genitourinary:  Negative for dysuria and hematuria.  Musculoskeletal:  Positive for myalgias (left shoulder pain). Negative for arthralgias, back pain and joint swelling.  Skin:  Negative for rash.  Neurological:  Negative for dizziness, weakness and headaches.  Psychiatric/Behavioral:  Negative for dysphoric mood. The patient is not nervous/anxious.      Objective:  BP 96/62 (BP Location: Left Arm, Patient Position: Sitting)   Pulse (!) 52   Temp 98.7 F (37.1 C) (Temporal)   Ht '5\' 7"'$  (1.702 m)   Wt 252 lb (114.3 kg)   SpO2 97%   BMI 39.47 kg/m      05/11/2022    1:26 PM 05/10/2022   10:56 AM 03/24/2022    2:00 PM  BP/Weight  Systolic BP 96 836 629  Diastolic BP 62 74 82  Wt. (Lbs) 252 253 257.6  BMI 39.47 kg/m2 39.63 kg/m2 40.35 kg/m2    Physical Exam Vitals reviewed.  Constitutional:      Appearance: Normal appearance. She is obese.  Cardiovascular:     Rate and Rhythm: Normal rate. Rhythm irregular.     Heart sounds: Normal heart sounds.  Pulmonary:     Effort: Pulmonary effort is normal.     Breath sounds: Normal breath sounds.  Musculoskeletal:        General: Tenderness (over anterior and posterior left shoulder. poor ROM.) present.  Neurological:     Mental Status: She is alert and oriented to person, place, and time.  Psychiatric:        Mood and Affect: Mood normal.         Behavior: Behavior normal.     Diabetic Foot Exam - Simple   No data filed      Lab Results  Component Value Date   WBC 7.1 05/10/2022   HGB 11.0 (L) 05/10/2022   HCT 35.8 05/10/2022   PLT 263 05/10/2022   GLUCOSE 96 05/10/2022   CHOL 120 05/10/2022   TRIG 88 05/10/2022   HDL 57 05/10/2022   LDLCALC 46 05/10/2022   ALT 10 05/10/2022   AST 11 05/10/2022   NA 144 05/10/2022   K 4.2 05/10/2022   CL 105 05/10/2022   CREATININE 1.02 (H) 05/10/2022   BUN 11 05/10/2022   CO2 25 05/10/2022   TSH 1.581 01/20/2021      Assessment & Plan:   Problem List Items Addressed This Visit       Cardiovascular and Mediastinum   A-fib ([place])    I was concerned about bradycardia so did an EKG which showed atrial fibrillation with a rate 69. Patient was having PACs which were causing her to sound bradycardic.       Relevant Orders   EKG 12-Lead (Completed)     Musculoskeletal and Integument   Impingement syndrome of left shoulder    Risks were discussed including bleeding, infection, increase in sugars if diabetic, atrophy at site of injection, and increased pain.  After consent was obtained, using sterile technique the left shoulder was prepped with alcohol.  The joint was entered posteriorly and  Kenalog 80 mg and 5 ml plain Lidocaine was then injected and the needle withdrawn.  The procedure was well tolerated.   The patient is asked to continue to rest the joint for a few more days before resuming regular activities.  It may be more painful for the first 1-2 days.  Watch for fever, or increased swelling or persistent pain in the joint. Call or return to clinic prn if such symptoms occur or there is failure to improve as anticipated.         Other   B12 deficiency    Continue B12 1000 mcg once daily      Relevant Orders   Vitamin B12   Mixed hyperlipidemia - Primary    Recommend continue to work on eating healthy diet and exercise.       Relevant Orders   VITAMIN D 25  Hydroxy (Vit-D Deficiency, Fractures)   TSH  .  Meds ordered this encounter  Medications   triamcinolone acetonide (KENALOG-40) injection 80 mg    Orders Placed This Encounter  Procedures   VITAMIN D 25 Hydroxy (Vit-D Deficiency, Fractures)   Vitamin B12   TSH   EKG 12-Lead    Follow-up: Return in about 6 weeks (around 06/22/2022).  An After Visit Summary was printed and given to the patient.  I,Lauren M Auman,acting as a scribe for Rochel Brome, MD.,have documented all relevant documentation on the behalf of Rochel Brome, MD,as directed by  Rochel Brome, MD while in the presence of Rochel Brome, MD.   Rochel Brome, MD [place] [phone]

## 2022-05-13 ENCOUNTER — Encounter: Payer: Self-pay | Admitting: Family Medicine

## 2022-05-17 ENCOUNTER — Other Ambulatory Visit: Payer: Self-pay | Admitting: Family Medicine

## 2022-05-17 ENCOUNTER — Encounter: Payer: Self-pay | Admitting: Family Medicine

## 2022-05-17 DIAGNOSIS — R1013 Epigastric pain: Secondary | ICD-10-CM

## 2022-05-17 NOTE — Assessment & Plan Note (Signed)
Continue B12 1000 mcg once daily

## 2022-05-17 NOTE — Assessment & Plan Note (Signed)
Recommend continue to work on eating healthy diet and exercise.  

## 2022-05-18 ENCOUNTER — Encounter: Payer: Self-pay | Admitting: Family Medicine

## 2022-05-18 NOTE — Assessment & Plan Note (Signed)
The current medical regimen is effective;  continue present plan and medications.  

## 2022-05-18 NOTE — Assessment & Plan Note (Signed)
Return for kenalog injection

## 2022-05-18 NOTE — Assessment & Plan Note (Signed)
Management per specialist. 

## 2022-05-18 NOTE — Assessment & Plan Note (Signed)
The current medical regimen is somewhat effective;  continue present plan and medications. Continue wellbutrin xl 450 mg qam.

## 2022-05-18 NOTE — Assessment & Plan Note (Signed)
Well controlled.  No changes to medicines. Continue repatha Continue to work on eating a healthy diet and exercise.

## 2022-05-19 ENCOUNTER — Other Ambulatory Visit: Payer: Self-pay | Admitting: Oncology

## 2022-05-19 ENCOUNTER — Other Ambulatory Visit: Payer: Self-pay | Admitting: Family Medicine

## 2022-05-19 DIAGNOSIS — Z1231 Encounter for screening mammogram for malignant neoplasm of breast: Secondary | ICD-10-CM

## 2022-05-19 MED ORDER — DULOXETINE HCL 30 MG PO CPEP: 30.0000 mg | ORAL_CAPSULE | Freq: Every day | ORAL | 1 refills | Status: DC

## 2022-06-01 ENCOUNTER — Encounter: Payer: Self-pay | Admitting: Oncology

## 2022-06-01 DIAGNOSIS — M810 Age-related osteoporosis without current pathological fracture: Secondary | ICD-10-CM | POA: Diagnosis not present

## 2022-06-07 ENCOUNTER — Other Ambulatory Visit: Payer: Self-pay | Admitting: Family Medicine

## 2022-06-08 DIAGNOSIS — M47816 Spondylosis without myelopathy or radiculopathy, lumbar region: Secondary | ICD-10-CM | POA: Diagnosis not present

## 2022-06-23 ENCOUNTER — Encounter: Payer: Self-pay | Admitting: Family Medicine

## 2022-06-23 ENCOUNTER — Ambulatory Visit (INDEPENDENT_AMBULATORY_CARE_PROVIDER_SITE_OTHER): Payer: PPO | Admitting: Family Medicine

## 2022-06-23 VITALS — BP 118/70 | HR 89 | Temp 97.3°F | Ht 67.0 in | Wt 253.0 lb

## 2022-06-23 DIAGNOSIS — M7542 Impingement syndrome of left shoulder: Secondary | ICD-10-CM

## 2022-06-23 NOTE — Progress Notes (Unsigned)
Subjective:  Patient ID: Monique Kelly, female    DOB: 1955-11-12  Age: 67 y.o. MRN: [id number]  Chief Complaint  Patient presents with   LEFT SHOULDER IMPINGEMENT    6 week follow up    HPI Patient presents for 6 week follow up of left shoulder impingement. When patient was last seen patient was given steroid injection. States injection has not helped very much.  Patient had substernal pain on Monday. 154/120.P 80. Patient took a metoprolol and an omeprazole and it improved. Felt like heart burn.      06/23/2022    1:51 PM 03/24/2022    2:13 PM 08/04/2021   11:49 AM 03/19/2021   11:02 AM 02/02/2021    8:11 AM  Depression screen PHQ 2/9  Decreased Interest 0 1 0 2 1  Down, Depressed, Hopeless 0 1 0 1 1  PHQ - 2 Score 0 2 0 3 2  Altered sleeping '3 3  3 3  '$ Tired, decreased energy '3 3  3 3  '$ Change in appetite 0 0  0 3  Feeling bad or failure about yourself  0 0  0 0  Trouble concentrating '1 1  2 1  '$ Moving slowly or fidgety/restless 0 0  0 0  Suicidal thoughts 0 0  0 0  PHQ-9 Score '7 9  11 12  '$ Difficult doing work/chores Not difficult at all Somewhat difficult  Somewhat difficult Not difficult at all         01/03/2022    2:36 PM 03/24/2022    2:24 PM 03/24/2022    2:37 PM 05/10/2022   10:57 AM 06/23/2022    1:50 PM  Fall Risk  Falls in the past year?  '1  1 1  '$ Was there an injury with Fall?  1  0 0  Was there an injury with Fall? - Comments   fell and hit her face on one of them. 2-3 months ago. no loc.    Fall Risk Category Calculator  '3  2 2  '$ Fall Risk Category (Retired)  High  Moderate   (RETIRED) Patient Fall Risk Level Low fall risk High fall risk  High fall risk   Patient at Risk for Falls Due to  History of fall(s) Impaired balance/gait;Impaired mobility History of fall(s) History of fall(s)  Fall risk Follow up  Education provided Falls prevention discussed Falls evaluation completed Falls evaluation completed      Review of Systems  Constitutional:  Negative  for appetite change, fatigue and fever.  HENT:  Negative for congestion, ear pain, sinus pressure and sore throat.   Respiratory:  Negative for cough, chest tightness, shortness of breath and wheezing.   Cardiovascular:  Negative for chest pain and palpitations.  Gastrointestinal:  Negative for abdominal pain, constipation, diarrhea, nausea and vomiting.  Genitourinary:  Negative for dysuria and hematuria.  Musculoskeletal:  Positive for arthralgias. Negative for back pain, joint swelling and myalgias.       Left shoulder pain  Skin:  Negative for rash.  Neurological:  Negative for dizziness, weakness and headaches.  Psychiatric/Behavioral:  Negative for dysphoric mood. The patient is not nervous/anxious.     Current Outpatient Medications on File Prior to Visit  Medication Sig Dispense Refill   buPROPion (WELLBUTRIN XL) 150 MG 24 hr tablet Take 150 mg by mouth daily.     buPROPion (WELLBUTRIN XL) 300 MG 24 hr tablet Take 300 mg by mouth daily.     calcium carbonate (OS-CAL - DOSED  IN MG OF ELEMENTAL CALCIUM) 1250 (500 Ca) MG tablet Take 1 tablet by mouth daily with breakfast.     cyanocobalamin 1000 MCG tablet Take by mouth.     DULoxetine (CYMBALTA) 30 MG capsule Take 1 capsule (30 mg total) by mouth at bedtime. (Patient not taking: Reported on 06/23/2022) 30 capsule 1   ergocalciferol (VITAMIN D2) 1.25 MG (50000 UT) capsule Take by mouth.     Evolocumab (REPATHA SURECLICK) 250 MG/ML SOAJ Inject 140 mg into the skin every 14 (fourteen) days. 2 mL 12   furosemide (LASIX) 20 MG tablet Take 1 tablet (20 mg total) by mouth daily as needed for edema. 30 tablet 0   gabapentin (NEURONTIN) 600 MG tablet Take 1 tablet (600 mg total) by mouth 3 (three) times daily. 270 tablet 0   letrozole (FEMARA) 2.5 MG tablet TAKE ONE TABLET BY MOUTH EVERY DAY 90 tablet 3   levothyroxine (SYNTHROID) 75 MCG tablet Take 75 mcg by mouth daily.      metoprolol tartrate (LOPRESSOR) 25 MG tablet Take 1 tablet (25 mg  total) by mouth daily as needed (PALPITATION/ AFIB). 90 tablet 0   omeprazole (PRILOSEC) 40 MG capsule Take 1 capsule (40 mg total) by mouth in the morning and at bedtime. 180 capsule 3   Zoledronic Acid (RECLAST IV) Inject into the vein.     No current facility-administered medications on file prior to visit.   Past Medical History:  Diagnosis Date   Atrial fibrillation [facility]) 2016   Sees Dr. [name]. Cardiac ablation, loop recorder (removed in 2022)   Atrial fibrillation with rapid ventricular response ([place]) 04/2015   Breast cancer ([place])    right lumpectomy   BRONCHITIS 03/10/2009   Qualifier: Diagnosis of  By: Geoffry Paradise MD, Kristine Royal    Chest pain with moderate risk for cardiac etiology 04/28/2015   Dehydration 06/18/2009   Qualifier: Diagnosis of  By: Pete Pelt     Depression    Family history of breast cancer    Family history of colon cancer    Fibromyalgia 2010   Hyperlipidemia    Hypothyroidism (acquired) 2006   s/p thyroidectomy for nodules   Morbid obesity ([place]) 08/10/2015   Obesity    gastric bpg 2011, weight then 400 lbs, lost 100 lbs in 60 days.   Obstructive sleep apnea    does not use cpap, does not have a following physician   Paroxysmal atrial fibrillation ([place]) 08/10/2015   Past Surgical History:  Procedure Laterality Date   ABDOMINAL HYSTERECTOMY  1992   total.    APPENDECTOMY     BREAST SURGERY  2019   right lumpectomy    CHOLECYSTECTOMY     COLONOSCOPY     ELECTROPHYSIOLOGIC STUDY N/A 09/08/2015   Procedure: Atrial Fibrillation Ablation;  Surgeon: [name], MD;  Location: [place];  Service: Cardiovascular;  Laterality: N/A;   EP IMPLANTABLE DEVICE N/A 12/17/2015   Procedure: Loop Recorder Insertion;  Surgeon: [name], MD;  Location: [place] [facility];  Service: Cardiovascular;  Laterality: N/A;   ESOPHAGOGASTRODUODENOSCOPY     EXPLORATORY LAPAROTOMY WITH ABDOMINAL MASS EXCISION  2018   GASTRIC BYPASS  2011   lost 100 lbs,  but gained some back.   implantable loop recorder removal  03/25/2020   removed 2022   NECK SURGERY  2005   laminectomy?   THYROIDECTOMY  2006   for nodules ( hot and cold nodules) Goiter compressing trachea.    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 Works as Technical brewer for [employer].   Right handed   Social Determinants of Health   Financial Resource Strain: Medium Risk (03/24/2022)   Overall Financial Resource Strain (CARDIA)    Difficulty of Paying Living Expenses: Somewhat hard  Food Insecurity: No Food Insecurity (03/24/2022)   Hunger Vital Sign    Worried About Running Out of Food in the Last Year: Never true    [place] in the Last Year: Never true  Transportation Needs: No Transportation Needs (03/24/2022)   PRAPARE - Hydrologist (Medical): No    Lack of Transportation (Non-Medical): No  Physical Activity: Inactive (03/24/2022)   Exercise Vital Sign    Days of Exercise per Week: 0 days    Minutes of Exercise per Session: 0 min  Stress: Stress Concern Present (03/24/2022)    [place]    Feeling of Stress : Rather much  Social Connections: Socially Isolated (03/24/2022)   Social Connection and Isolation Panel [NHANES]    Frequency of Communication with Friends and Family: More than three times a week    Frequency of Social Gatherings with Friends and Family: Twice a week    Attends Religious Services: Never    Printmaker: No    Attends Music therapist: Never    Marital Status: Never married    Objective:  BP 118/70   Pulse 89   Temp (!) 97.3 F (36.3 C)   Ht '5\' 7"'$  (1.702 m)   Wt 253 lb (114.8 kg)   SpO2 94%   BMI 39.63 kg/m      06/23/2022    1:48 PM 05/11/2022    1:26 PM 05/10/2022   10:56 AM  BP/Weight  Systolic BP 182 96 993  Diastolic BP 70 62 74  Wt. (Lbs) 253 252 253  BMI 39.63 kg/m2 39.47 kg/m2 39.63 kg/m2    Physical Exam Vitals reviewed.  Constitutional:      Appearance: Normal appearance. She is obese.  Cardiovascular:     Rate and Rhythm: Normal rate and regular rhythm.     Heart sounds: Normal heart sounds.  Pulmonary:     Effort: Pulmonary effort is normal.     Breath sounds: Normal breath sounds.  Abdominal:     General:  Abdomen is flat. Bowel sounds are normal.     Palpations: Abdomen is soft.     Tenderness: There is no abdominal tenderness.  Musculoskeletal:        General: No tenderness.     Comments: LEFT SHOULDER EXAM TENDER: anterior and posteriorly FROM ABNORMAL ABDUCTION: VERY POOR (20DEGREES) EXTERNAL ROTATION:  30 DEGREES INTERNAL ROTATION:10 DEGREES EMPTY CAN SIGN: UNABLE TO ASSESS.  Neurological:     Mental Status: She is alert and oriented to person, place, and time.  Psychiatric:        Mood and Affect: Mood normal.        Behavior: Behavior normal.     Diabetic Foot Exam - Simple   No data filed      Lab Results  Component Value Date   WBC 7.1 05/10/2022   HGB 11.0 (L) 05/10/2022    HCT 35.8 05/10/2022   PLT 263 05/10/2022   GLUCOSE 96 05/10/2022   CHOL 120 05/10/2022   TRIG 88 05/10/2022   HDL 57 05/10/2022   LDLCALC 46 05/10/2022   ALT 10 05/10/2022   AST 11 05/10/2022   NA 144 05/10/2022   K 4.2 05/10/2022   CL 105 05/10/2022   CREATININE 1.02 (H) 05/10/2022   BUN 11 05/10/2022   CO2 25 05/10/2022   TSH 1.581 01/20/2021      Assessment & Plan:    Impingement syndrome of left shoulder Assessment & Plan: Ordered Left shoulder Xray. Ordered MRI of the left Shoulder. Referral to Orthopedics.  Orders: -     MR SHOULDER LEFT WO CONTRAST; Future -     DG Shoulder Left; Future -     Ambulatory referral to Orthopedics     No orders of the defined types were placed in this encounter.   Orders Placed This Encounter  Procedures   MR Shoulder Left Wo Contrast   DG Shoulder Left   AMB referral to orthopedics     Follow-up: Return in about 2 months (around 08/22/2022) for chronic fasting.  An After Visit Summary was printed and given to the patient.  I,Vergia Chea,acting as a Education administrator for Rochel Brome, MD.,have documented all relevant documentation on the behalf of Rochel Brome, MD,as directed by  Rochel Brome, MD while in the presence of Rochel Brome, MD.    Rochel Brome, MD [place] [phone]

## 2022-06-24 DIAGNOSIS — Z01818 Encounter for other preprocedural examination: Secondary | ICD-10-CM | POA: Diagnosis not present

## 2022-06-24 DIAGNOSIS — M5134 Other intervertebral disc degeneration, thoracic region: Secondary | ICD-10-CM | POA: Diagnosis not present

## 2022-06-24 DIAGNOSIS — M47816 Spondylosis without myelopathy or radiculopathy, lumbar region: Secondary | ICD-10-CM | POA: Diagnosis not present

## 2022-06-25 NOTE — Assessment & Plan Note (Signed)
Ordered Left shoulder Xray. Ordered MRI of the left Shoulder. Referral to Orthopedics.

## 2022-07-01 DIAGNOSIS — M47816 Spondylosis without myelopathy or radiculopathy, lumbar region: Secondary | ICD-10-CM | POA: Diagnosis not present

## 2022-07-06 ENCOUNTER — Inpatient Hospital Stay: Payer: PPO | Attending: Oncology | Admitting: Oncology

## 2022-07-06 ENCOUNTER — Other Ambulatory Visit: Payer: Self-pay | Admitting: Oncology

## 2022-07-06 ENCOUNTER — Encounter: Payer: Self-pay | Admitting: Oncology

## 2022-07-06 ENCOUNTER — Inpatient Hospital Stay: Payer: PPO

## 2022-07-06 VITALS — BP 135/80 | HR 82 | Temp 98.8°F | Resp 16 | Ht 67.0 in | Wt 249.7 lb

## 2022-07-06 DIAGNOSIS — M549 Dorsalgia, unspecified: Secondary | ICD-10-CM | POA: Diagnosis not present

## 2022-07-06 DIAGNOSIS — Z170 Estrogen receptor positive status [ER+]: Secondary | ICD-10-CM

## 2022-07-06 DIAGNOSIS — C50411 Malignant neoplasm of upper-outer quadrant of right female breast: Secondary | ICD-10-CM

## 2022-07-06 DIAGNOSIS — M797 Fibromyalgia: Secondary | ICD-10-CM | POA: Insufficient documentation

## 2022-07-06 DIAGNOSIS — Z803 Family history of malignant neoplasm of breast: Secondary | ICD-10-CM | POA: Insufficient documentation

## 2022-07-06 DIAGNOSIS — E039 Hypothyroidism, unspecified: Secondary | ICD-10-CM | POA: Diagnosis not present

## 2022-07-06 DIAGNOSIS — M8580 Other specified disorders of bone density and structure, unspecified site: Secondary | ICD-10-CM | POA: Diagnosis not present

## 2022-07-06 DIAGNOSIS — Z800 Family history of malignant neoplasm of digestive organs: Secondary | ICD-10-CM | POA: Insufficient documentation

## 2022-07-06 DIAGNOSIS — R52 Pain, unspecified: Secondary | ICD-10-CM | POA: Insufficient documentation

## 2022-07-06 DIAGNOSIS — Z79899 Other long term (current) drug therapy: Secondary | ICD-10-CM | POA: Insufficient documentation

## 2022-07-06 DIAGNOSIS — D509 Iron deficiency anemia, unspecified: Secondary | ICD-10-CM

## 2022-07-06 DIAGNOSIS — Z780 Asymptomatic menopausal state: Secondary | ICD-10-CM | POA: Diagnosis not present

## 2022-07-06 DIAGNOSIS — Z8249 Family history of ischemic heart disease and other diseases of the circulatory system: Secondary | ICD-10-CM | POA: Insufficient documentation

## 2022-07-06 DIAGNOSIS — F32A Depression, unspecified: Secondary | ICD-10-CM | POA: Insufficient documentation

## 2022-07-06 DIAGNOSIS — K219 Gastro-esophageal reflux disease without esophagitis: Secondary | ICD-10-CM | POA: Diagnosis not present

## 2022-07-06 LAB — CMP (CANCER CENTER ONLY)
ALT: 10 U/L (ref 0–44)
AST: 17 U/L (ref 15–41)
Albumin: 2.9 g/dL — ABNORMAL LOW (ref 3.5–5.0)
Alkaline Phosphatase: 67 U/L (ref 38–126)
Anion gap: 8 (ref 5–15)
BUN: 14 mg/dL (ref 8–23)
CO2: 29 mmol/L (ref 22–32)
Calcium: 8.6 mg/dL — ABNORMAL LOW (ref 8.9–10.3)
Chloride: 104 mmol/L (ref 98–111)
Creatinine: 1 mg/dL (ref 0.44–1.00)
GFR, Estimated: >60 mL/min (ref >60)
Glucose, Bld: 96 mg/dL (ref 70–99)
Potassium: 3.6 mmol/L (ref 3.5–5.1)
Sodium: 141 mmol/L (ref 135–145)
Total Bilirubin: 0.5 mg/dL (ref 0.3–1.2)
Total Protein: 6.1 g/dL — ABNORMAL LOW (ref 6.5–8.1)

## 2022-07-06 LAB — CBC WITH DIFFERENTIAL (CANCER CENTER ONLY)
Abs Immature Granulocytes: 0.01 10*3/uL (ref 0.00–0.07)
Basophils Absolute: 0 10*3/uL (ref 0.0–0.1)
Basophils Relative: 1 %
Eosinophils Absolute: 0.1 10*3/uL (ref 0.0–0.5)
Eosinophils Relative: 1 %
HCT: 33.5 % — ABNORMAL LOW (ref 36.0–46.0)
Hemoglobin: 9.9 g/dL — ABNORMAL LOW (ref 12.0–15.0)
Immature Granulocytes: 0 %
Lymphocytes Relative: 22 %
Lymphs Abs: 1.4 10*3/uL (ref 0.7–4.0)
MCH: 26.7 pg (ref 26.0–34.0)
MCHC: 29.6 g/dL — ABNORMAL LOW (ref 30.0–36.0)
MCV: 90.3 fL (ref 80.0–100.0)
Monocytes Absolute: 0.4 10*3/uL (ref 0.1–1.0)
Monocytes Relative: 7 %
Neutro Abs: 4.3 10*3/uL (ref 1.7–7.7)
Neutrophils Relative %: 69 %
Platelet Count: 273 10*3/uL (ref 150–400)
RBC: 3.71 MIL/uL — ABNORMAL LOW (ref 3.87–5.11)
RDW: 18.7 % — ABNORMAL HIGH (ref 11.5–15.5)
WBC Count: 6.2 10*3/uL (ref 4.0–10.5)
nRBC: 0 % (ref 0.0–0.2)

## 2022-07-06 MED ORDER — SODIUM CHLORIDE 0.9% FLUSH
10.0000 mL | INTRAVENOUS | Status: DC | PRN
  Administered 2022-07-06: 10 mL

## 2022-07-06 MED ORDER — HEPARIN SOD (PORK) LOCK FLUSH 100 UNIT/ML IV SOLN
500.0000 [IU] | Freq: Once | INTRAVENOUS | Status: AC | PRN
  Administered 2022-07-06: 500 [IU]

## 2022-07-06 NOTE — Progress Notes (Addendum)
ADDENDUM: Her labs were remarkable for a drop in the hemoglobin from 11.0 in December 2023 to 9.9 with an MCV of 90, and normal white count and normal platelet count.  Labs to evaluate that revealed an iron level of 29 with a TIBC of 329 for a 9% saturation.  Her ferritin is 8 and so this confirms iron deficiency anemia.  I have instructed her to start iron at 1 pill daily but I also recommend a referral to Dr. Sandria Kelly.  She admits she is overdue for routine colonoscopy and does have a history of peptic ulcer disease.  I think she will need an EGD as well as colonoscopy to evaluate this.  I will also plan to repeat labs and follow-up appointment in 6 months when she returns for her port flush.  She was also found to have calcifications of the left breast on the mammogram from July 08, 2022.  We will schedule her for a diagnostic mammogram to follow-up on this.   [facility]  [address] 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 She remains without evidence of recurrence. She will continue letrozole 2.5 mg daily for at least 5 years, until August 2024. She will return to clinic in 6 months for repeat evaluation.    Osteopenia after menopause Osteopenia, for which she was started on yearly Reclast in July 2021. She continues OsCal BID. She had a recent CT for cardiac evaluation which revealed a rib fracture associated with trauma. She does report several falls over the last 6 months. Her PCP is helping to evaluate her issues with falling. She is also due Reclast now.    Plan She informed me that taking Cymbalta '30mg'$  has improved the pain and ROM, so she may cancel the MRI scan of her shoulder. Her port was flushed today and her labs are pending. She  will come back in  6 months to flush her port and I will see her back in 1 year with bilateral mammogram screening, CBC, CMP, and to have her port flushed again then she will go for her yearly Reclast infusion. The patient understands the plans discussed today and is in agreement with them.  She knows to contact our office if she develops concerns prior to her next appointment.  I provided 25 minutes of face-to-face time during this encounter and > 50% was spent counseling as documented under my assessment and plan.   Monique Kelly [address] [place] [address] Dept: [phone] Dept Fax: [phone]   CHIEF COMPLAINT:  CC: Breast cancer of the upper outer quadrant of the right breast stage IIA ([place])    Current Treatment: Letrozole   HISTORY OF PRESENT ILLNESS:  Monique Kelly is a 67 y.o. 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  She was treated with lumpectomy.  Pathology revealed a 2.1 cm, grade 3, invasive ductal carcinoma.  Two sentinel nodes were negative for metastasis.  Estrogen and progesterone receptors were highly positive and her 2 Neu negative.  Ki 67 was 20%.  Endopredict revealed an EP clin score of 4.0, which is considered high risk and correlates  with an 18% risk of distant recurrence in the next 10 years without chemotherapy.  She would get an absolute 7 % benefit with chemotherapy at 10 years.  She has a moderate risk of late recurrence of 14% in years 5-15.  She received adjuvant chemotherapy with docetaxel/cyclophosphamide (TC) for 4 cycles completed in May.  She tolerated this fairly well, except for pain for neuropathy.  The presentation of her neuropathy was unusual, with intermittent symptoms.  Bone density scan in May 2019 revealed osteopenia with a T-score of -1.9 in the spine, for which she is taking calcium and  vitamin-D.   She completed adjuvant radiation in mid July.  She was started on letrozole 2.5 mg daily in mid August 2019.  After completion of chemotherapy, she developed lower extremity edema, right greater than left.  Peripheral edema is a fairly common side effect of docetaxel.  Right lower extremity ultrasound did not reveal any evidence of deep venous thrombosis.  She was placed on furosemide 20 mg daily as needed. Furosemide was subsequently discontinued.  She also has a history of fibromyalgia with associated generalized pain, for which she is on gabapentin 300 mg 3 times daily, as well as bupropion 450 mg daily.  Tramadol was added in May, but later discontinued when she was found to have sleep apnea.     As she described numbness and electrical shocks sensation of the left inner calf, an MRI lumbosacral spine was done.  This revealed revealed multilevel degenerative disc disease with L4-5 left foraminal herniation impinging on the left L4 nerve root with moderate posterior element hypertrophy at that level with mild spinal stenosis, as well as well as spurring at L3-4.  She was referred to Neurosurgery and saw Dr. Saintclair Kelly.  She had abdominal pain and bloating, so saw Dr. Lyda Kelly in October 2019.  She has a history of Roux-en-Y bariatric surgery in 2008.  Endoscopy in November of 2019 revealed a small bowel ulcer at the anastomosis.  Dr. Lyda Kelly had her stop nonsteroidal anti-inflammatory medications and placed her on omeprazole 40 mg daily.  Her hemoglobin remained low at 10.6 in March 2020, repeat iron studies revealed persistent iron deficiency.  She was treated with hemocyte from March to September 2020.   Bilateral diagnostic mammogram in January 2021 did not reveal any evidence of malignancy.  She had COVID in February 2021.  Bone density scan in July 2021 revealed worsening osteopenia with a T-score of -2.3 in the right femur neck a T-score of -2 in the total hip and a T-score of -1.1 in the  spine.  Due to worsening bone density, she was started on Reclast.  A nuclear bone scan in July 2021 did not reveal any evidence of malignancy.  She has reported worsening cognitive impairment, and even discussed a possible clinical trial with our research nurse, but decided not to pursue it.  MRI brain in January which did not reveal any acute abnormality. Mild chronic microvascular ischemic disease and generalized cerebral volume loss was seen.  Oncology History  Breast cancer of upper-outer quadrant of right female breast ([place])  05/30/2017 Initial Diagnosis    Breast cancer of upper-outer quadrant of right female breast ([place])    06/10/2017 Cancer Staging    Staging form: Breast, AJCC 8th Edition - Clinical stage from 06/10/2017: Stage IIA (cT2, cN0(sn), cM0, G3, ER+, PR+, HER2-) - Signed by Derwood Kaplan, MD on 01/24/2021 Histopathologic type: Infiltrating duct carcinoma, NOS Stage prefix: Initial diagnosis Method of lymph node  assessment: Sentinel lymph node biopsy Nuclear grade: G3 Multigene prognostic tests performed: EndoPredict Histologic grading system: 3 grade system Laterality: Right Tumor size (mm): 21 Lymph-vascular invasion (LVI): LVI not present (absent)/not identified Diagnostic confirmation: Positive histology Specimen type: Excision Staged by: Managing physician Menopausal status: Postmenopausal Ki-67 (%): 20 EndoPredict EPclin risk score: 4 EndoPredict EPclin risk level: High risk EndoPredict 10-year percentage risk of distant recurrence (%): 18 Stage used in treatment planning: Yes National guidelines used in treatment planning: Yes Type of national guideline used in treatment planning: NCCN Staging comments: TC x 4, lumpectomy, radiation, letrozole since Aug 2019   INTERVAL HISTORY:  Monique "[name]" is here today for follow up with breast cancer of the upper outer quadrant of the right breast stage IIA. Patient states that she feels ok but has had a lot of back  pain rating 8/10 and that injections or medication hasn't helped although she does not want to go through surgery. She had a RFA ablation done and that has showed no improvement. Her last bone density scan showed stable osteopenia in the right hip and the total mean femur revealed significant improvement with a  T-score going from -2.0 to -1.1. However the left forearm still reveals osteoporosis with a T-score of -2.9. Her next mammogram is scheduled on 07/08/2022. She has not received her yearly Reclast but is due to take it now. She will receive an MRI of her shoulder next week due to problems of her left arm to the point that she couldn't move it. She informed me that taking Cymbalta '30mg'$  has improved the pain and ROM, so she may cancel that scan. Her port was flushed today and her labs are pending. She will come back in  6 months to flush her port and I will see her back in 1 year with bilateral mammogram screening, CBC, CMP, and to have her port flushed again then she will go for her yearly Reclast infusion. She denies signs of infection such as sore throat, sinus drainage, cough, or urinary symptoms.  She denies fevers or recurrent chills. She denies pain. She denies nausea, vomiting, chest pain, dyspnea or cough. Her appetite is fair and her weight has decreased 3 pounds over last 1.5 months.   HISTORY:  Allergies:           Reaction           Severity         Reaction Type       Noted Nitroglycerin     Hypotension,           Low                                           09/27/2017                     difficulty maintaining                          consciousness  Duloxetine      Dizziness and nausea                    Intolerance           06/23/2022  Current Outpatient Medications on File Prior to Visit  Medication Sig Dispense   buPROPion (WELLBUTRIN XL) 150 MG 24 hr tablet Take  150 mg by mouth daily.     buPROPion (WELLBUTRIN XL) 300 MG 24 hr tablet Take 300 mg by mouth daily.     calcium  carbonate (OS-CAL - DOSED IN MG OF ELEMENTAL CALCIUM) 1250 (500 Ca) MG tablet Take 1 tablet by mouth daily with breakfast.     cyanocobalamin 1000 MCG tablet Take by mouth.     DULoxetine (CYMBALTA) 30 MG capsule Take 1 capsule (30 mg total) by mouth at bedtime. (Patient not taking: Reported on 06/23/2022) 30 capsule   ergocalciferol (VITAMIN D2) 1.25 MG (50000 UT) capsule Take by mouth.     Evolocumab (REPATHA SURECLICK) [id number] MG/ML SOAJ Inject 140 mg into the skin every 14 (fourteen) days. 2 mL   furosemide (LASIX) 20 MG tablet Take 1 tablet (20 mg total) by mouth daily as needed for edema. 30 tablet   gabapentin (NEURONTIN) 600 MG tablet Take 1 tablet (600 mg total) by mouth 3 (three) times daily. 270 tablet   letrozole (FEMARA) 2.5 MG tablet TAKE ONE TABLET BY MOUTH EVERY DAY 90 tablet   levothyroxine (SYNTHROID) 75 MCG tablet Take 75 mcg by mouth daily.      metoprolol tartrate (LOPRESSOR) 25 MG tablet Take 1 tablet (25 mg total) by mouth daily as needed (PALPITATION/ AFIB). 90 tablet   omeprazole (PRILOSEC) 40 MG capsule Take 1 capsule (40 mg total) by mouth in the morning and at bedtime. 180 capsule   Zoledronic Acid (RECLAST IV) Inject into the vein.      No current facility-administered medications on file prior to visit.    Past Medical History:  Diagnosis Date   Atrial fibrillation [facility]) 2016    Sees Dr. [name]. Cardiac ablation, loop recorder (removed in 2022)   Atrial fibrillation with rapid ventricular response ([place]) 04/2015   Breast cancer ([place])      right lumpectomy   BRONCHITIS 03/10/2009    Qualifier: Diagnosis of  By: Geoffry Paradise MD, Kristine Royal    Chest pain with moderate risk for cardiac etiology 04/28/2015   Dehydration 06/18/2009    Qualifier: Diagnosis of  By: Pete Pelt     Depression     Family history of breast cancer     Family history of colon cancer     Fibromyalgia 2010   Hyperlipidemia     Hypothyroidism (acquired) 2006    s/p thyroidectomy for nodules    Morbid obesity ([place]) 08/10/2015   Obesity      gastric bpg 2011, weight then 400 lbs, lost 100 lbs in 60 days.   Obstructive sleep apnea      does not use cpap, does not have a following physician   Paroxysmal atrial fibrillation ([place]) 08/10/2015    Past Surgical History:  Procedure Laterality Date   ABDOMINAL HYSTERECTOMY   1992    total.    APPENDECTOMY       BREAST SURGERY   2019    right lumpectomy    CHOLECYSTECTOMY       COLONOSCOPY       ELECTROPHYSIOLOGIC STUDY N/A 09/08/2015    Procedure: Atrial Fibrillation Ablation;  Surgeon: [name], MD;  Location: [facility];  Service: Cardiovascular;  Laterality: N/A;   EP IMPLANTABLE DEVICE N/A 12/17/2015    Procedure: Loop Recorder Insertion;  Surgeon: [name], MD;  Location: [facility];  Service: Cardiovascular;  Laterality: N/A;   ESOPHAGOGASTRODUODENOSCOPY       EXPLORATORY LAPAROTOMY WITH ABDOMINAL MASS EXCISION   2018  GASTRIC BYPASS   2011    lost 100 lbs, but gained some back.   implantable loop recorder removal   03/25/2020    removed 2022   NECK SURGERY   2005    laminectomy?   THYROIDECTOMY   2006    for nodules ( hot and cold nodules) Goiter compressing trachea.    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 Works as Technical brewer for [employer].    Right handed    Social Determinants of Health    Financial Resource Strain: Medium Risk (03/24/2022)    Overall Financial Resource Strain (CARDIA)     Difficulty of Paying Living Expenses: Somewhat hard  Food Insecurity: No Food Insecurity (03/24/2022)    Hunger Vital Sign     Worried About Running Out of Food in the Last Year: Never true     [place] in the Last Year: Never true  Transportation Needs: No Transportation Needs (03/24/2022)    PRAPARE - Armed forces logistics/support/administrative officer (Medical): No     Lack of Transportation (Non-Medical): No  Physical Activity: Inactive (03/24/2022)    Exercise Vital Sign     Days of Exercise per Week: 0 days     Minutes of Exercise per Session: 0 min  Stress: Stress Concern Present (03/24/2022)    [place]     Feeling of Stress : Rather much  Social Connections: Socially Isolated (03/24/2022)    Social Connection and Isolation Panel [NHANES]     Frequency of Communication with Friends and Family: More than three times a week     Frequency of Social Gatherings with Friends and Family: Twice a week     Attends Religious Services: Never     Marine scientist or Organizations: No     Attends Music therapist: Never     Marital Status: Never married    REVIEW OF SYMPTOMS:  Review of Systems  Constitutional: Negative.  Negative for appetite change, chills, diaphoresis, fatigue, fever and unexpected weight change.  HENT:  Negative.  Negative for hearing loss, lump/mass, mouth sores, nosebleeds, sore throat, tinnitus, trouble swallowing and voice change.   Eyes: Negative.  Negative for eye problems  and icterus.   Respiratory: Negative.  Negative for chest tightness, cough, hemoptysis, shortness of breath and wheezing.   Cardiovascular: Negative.  Negative for chest pain, leg swelling and palpitations.  Gastrointestinal: Negative.  Negative for abdominal distention, abdominal pain, blood in stool, constipation, diarrhea, nausea, rectal pain and vomiting.  Endocrine: Negative.   Genitourinary: Negative.  Negative for bladder incontinence, difficulty urinating, dyspareunia, dysuria, frequency, hematuria, menstrual problem, nocturia, pelvic pain, vaginal bleeding and vaginal discharge.   Musculoskeletal:  Positive for back pain. Negative for arthralgias, flank pain, gait problem, myalgias, neck pain and neck stiffness.       Left arm/shoulder pain.  Skin: Negative.  Negative for itching, rash and wound.  Neurological:  Negative for dizziness, extremity weakness, gait problem, headaches, light-headedness, numbness, seizures and speech difficulty.  Hematological: Negative.  Negative for adenopathy. Does not bruise/bleed easily.  Psychiatric/Behavioral: Negative.  Negative for confusion, decreased concentration, depression, sleep disturbance and suicidal ideas. The patient is not nervous/anxious.   All other systems reviewed and are negative.  VITAL SIGNS:   BSA: 2.31 m Wt: 249 lb 11.2 oz (113.3 kg) Ht: '5\' 7"'$  (1.702 m) BP: 135/80 BMI: 39.11 kg/m  PHYSICAL EXAM:  Physical Exam Vitals and nursing note reviewed.  Constitutional:      General: She is not in acute distress.    Appearance: Normal appearance. She is normal weight. She is not ill-appearing, toxic-appearing or diaphoretic.  HENT:     Head: Normocephalic and atraumatic.     Right Ear: Tympanic membrane, ear canal and external ear normal. There is no impacted cerumen.     Left Ear: Tympanic membrane, ear canal and external ear normal. There is no impacted cerumen.     Nose: Nose normal. No congestion or rhinorrhea.     Mouth/Throat:      Mouth: Mucous membranes are moist.     Pharynx: Oropharynx is clear. No oropharyngeal exudate or posterior oropharyngeal erythema.  Eyes:     General: No scleral icterus.       Right eye: No discharge.        Left eye: No discharge.     Extraocular Movements: Extraocular movements intact.     Conjunctiva/sclera: Conjunctivae normal.     Pupils: Pupils are equal, round, and reactive to light.  Neck:     Vascular: No carotid bruit.  Cardiovascular:     Rate and Rhythm: Normal rate and regular rhythm.     Pulses: Normal pulses.     Heart sounds: Normal heart sounds. No murmur heard.    No friction rub. No gallop.  Pulmonary:     Effort: Pulmonary effort is normal. No respiratory distress.     Breath sounds: Normal breath sounds. No stridor. No wheezing, rhonchi or rales.  Chest:     Chest wall: No tenderness.     Comments: Port in the left upper chest which is doing fine. Deep scar in the nipple acerola complex of the right breast.   No masses in either breast.  Abdominal:     General: Bowel sounds are normal. There is no distension.     Palpations: Abdomen is soft. There is no hepatomegaly, splenomegaly or mass.     Tenderness: There is no abdominal tenderness. There is no right CVA tenderness, left CVA tenderness, guarding or rebound.     Hernia: No hernia is present.  Musculoskeletal:        General: No swelling, tenderness, deformity or signs of injury. Normal range of motion.  Cervical back: Normal range of motion and neck supple. No rigidity or tenderness.     Right lower leg: No edema.     Left lower leg: No edema.  Lymphadenopathy:     Cervical: No cervical adenopathy.     Right cervical: No superficial, deep or posterior cervical adenopathy.    Left cervical: No superficial, deep or posterior cervical adenopathy.     Upper Body:     Right upper body: No supraclavicular, axillary or pectoral adenopathy.     Left upper body: No supraclavicular, axillary or pectoral  adenopathy.  Skin:    General: Skin is warm and dry.     Coloration: Skin is not jaundiced or pale.     Findings: No bruising, erythema, lesion or rash.  Neurological:     General: No focal deficit present.     Mental Status: She is alert and oriented to person, place, and time. Mental status is at baseline.     Cranial Nerves: No cranial nerve deficit.     Sensory: No sensory deficit.     Motor: No weakness.     Coordination: Coordination normal.     Gait: Gait normal.     Deep Tendon Reflexes: Reflexes normal.  Psychiatric:        Mood and Affect: Mood normal.        Behavior: Behavior normal.        Thought Content: Thought content normal.        Judgment: Judgment normal.    LABS:   Component Ref Range & Units 1 mo ago (05/10/22) 5 mo ago (01/31/22)  WBC 3.4 - 10.8 x10E3/uL 7.1 7.1  Hemoglobin 11.1 - 15.9 g/dL 11.0 Low  11.1  Hematocrit 34.0 - 46.6 % 35.8 36.5  MCV 79 - 97 fL 87 92  Platelets 150 - 450 x10E3/uL 263 295    Component Ref Range & Units 1 mo ago (05/10/22) 5 mo ago (01/31/22)  Glucose 70 - 99 mg/dL 96 85  BUN 8 - 27 mg/dL 11 16  Creatinine, Ser 0.57 - 1.00 mg/dL 1.02 High  0.90  eGFR >59 mL/min/1.73 61 71  BUN/Creatinine Ratio 12 - 28 11 Low  18  Sodium 134 - 144 mmol/L 144 142  Potassium 3.5 - 5.2 mmol/L 4.2 4.5  Chloride 96 - 106 mmol/L 105 106  CO2 20 - 29 mmol/L 25 25  Calcium 8.7 - 10.3 mg/dL 9.3 8.7  Total Protein 6.0 - 8.5 g/dL 6.1 5.8 Low   Albumin 3.9 - 4.9 g/dL 3.6 Low  3.3 Low   Globulin, Total 1.5 - 4.5 g/dL 2.5 2.5  Albumin/Globulin Ratio 1.2 - 2.2 1.4 1.3  Bilirubin Total 0.0 - 1.2 mg/dL 0.7 0.3  Alkaline Phosphatase 44 - 121 IU/L 112 113  AST 0 - 40 IU/L 11 8  ALT 0 - 32 IU/L 10 10   Component Ref Range & Units 1 mo ago (05/10/22) 8 mo ago (10/21/21) 1 yr ago (06/07/21) 1 yr ago (02/02/21)  Cholesterol, Total 100 - 199 mg/dL 120 136 138 221 High   Triglycerides 0 - 149 mg/dL 88 122 93 108  HDL >39 mg/dL 57 46 54 45  VLDL  Cholesterol Cal 5 - 40 mg/dL '17 22 17 19  '$ LDL Chol Calc (NIH) 0 - 99 mg/dL 46 68 67 157 High   Chol/HDL Ratio 0.0 - 4.4 ratio 2.1 3.0 CM 2.6 CM 4.9 High  CM     STUDIES:     EXAM:  02/12/2022 MRI LUMBAR SPINE WITHOUT CONTRAST IMPRESSION: 1. Stable compared to 2021. 2. Lumbar spine degeneration especially affecting the facets at L4-5 with anterolisthesis. No compressive stenosis.   I,Jasmine M Lassiter,acting as a scribe for Derwood Kaplan, MD.,have documented all relevant documentation on the behalf of Derwood Kaplan, MD,as directed by  Derwood Kaplan, MD while in the presence of Derwood Kaplan, MD.

## 2022-07-07 ENCOUNTER — Telehealth: Payer: Self-pay | Admitting: Oncology

## 2022-07-07 ENCOUNTER — Encounter: Payer: Self-pay | Admitting: Family Medicine

## 2022-07-07 NOTE — Telephone Encounter (Signed)
Contacted pt to schedule an appt. Unable to reach via phone, voicemail was left.    Follow-Up Information  Follow-up disposition: Return in about 1 year (around 07/07/2023).  Check out comments: Pls schedule Reclast 2/16 or next week  Flush port in 6 months  RT 1 year with bilat scr mammogram([facility]- after 2/16 for mammo), and labs & port flush Then plan Reclast 2 days later

## 2022-07-08 ENCOUNTER — Ambulatory Visit
Admission: RE | Admit: 2022-07-08 | Discharge: 2022-07-08 | Disposition: A | Discharge status: Home / Self Care | Payer: PPO | Source: Ambulatory Visit | Attending: Oncology | Admitting: Oncology

## 2022-07-08 ENCOUNTER — Encounter: Payer: Self-pay | Admitting: Hematology and Oncology

## 2022-07-08 DIAGNOSIS — Z1231 Encounter for screening mammogram for malignant neoplasm of breast: Secondary | ICD-10-CM | POA: Diagnosis not present

## 2022-07-08 NOTE — Addendum Note (Signed)
Addended by: Juanetta Beets on: 07/08/2022 11:06 AM   Modules accepted: Orders

## 2022-07-10 ENCOUNTER — Other Ambulatory Visit: Payer: Self-pay

## 2022-07-11 ENCOUNTER — Other Ambulatory Visit: Payer: Self-pay | Admitting: Oncology

## 2022-07-11 ENCOUNTER — Inpatient Hospital Stay: Payer: PPO

## 2022-07-11 ENCOUNTER — Telehealth: Payer: Self-pay

## 2022-07-11 VITALS — BP 127/70 | HR 90 | Temp 98.7°F | Resp 18 | Ht 67.0 in | Wt 249.1 lb

## 2022-07-11 DIAGNOSIS — M8580 Other specified disorders of bone density and structure, unspecified site: Secondary | ICD-10-CM

## 2022-07-11 DIAGNOSIS — C50411 Malignant neoplasm of upper-outer quadrant of right female breast: Secondary | ICD-10-CM | POA: Diagnosis not present

## 2022-07-11 DIAGNOSIS — D638 Anemia in other chronic diseases classified elsewhere: Secondary | ICD-10-CM

## 2022-07-11 MED ORDER — SODIUM CHLORIDE 0.9 % IV SOLN: Freq: Once | INTRAVENOUS | Status: AC

## 2022-07-11 MED ORDER — ZOLEDRONIC ACID 4 MG/100ML IV SOLN
4.0000 mg | Freq: Once | INTRAVENOUS | Status: AC
  Administered 2022-07-11: 4 mg via INTRAVENOUS
  Filled 2022-07-11: qty 100

## 2022-07-11 MED ORDER — HEPARIN SOD (PORK) LOCK FLUSH 100 UNIT/ML IV SOLN
500.0000 [IU] | Freq: Once | INTRAVENOUS | Status: AC | PRN
  Administered 2022-07-11: 500 [IU]

## 2022-07-11 MED ORDER — SODIUM CHLORIDE 0.9% FLUSH
10.0000 mL | INTRAVENOUS | Status: DC | PRN
  Administered 2022-07-11: 10 mL

## 2022-07-11 NOTE — Telephone Encounter (Signed)
Mammogram done 07/08/22, its in Epic but not resulted yet.

## 2022-07-11 NOTE — Telephone Encounter (Signed)
-----   Message from Derwood Kaplan, MD sent at 07/06/2022  9:05 AM EST ----- Regarding: mammo Did she get her mammo for this year?

## 2022-07-11 NOTE — Patient Instructions (Signed)

## 2022-07-12 ENCOUNTER — Telehealth: Payer: Self-pay

## 2022-07-12 ENCOUNTER — Encounter: Payer: Self-pay | Admitting: Hematology and Oncology

## 2022-07-12 NOTE — Telephone Encounter (Signed)
Patient notified and voiced understanding, we may be able to add labs to existing specimen or we may need patient to come back for repeat labs. Patient aware if repeat labs are needed scheduling will call her , Einar Pheasant in lab aware and awaiting an answer.

## 2022-07-12 NOTE — Telephone Encounter (Signed)
-----   Message from Belva Chimes, LPN sent at [id number]  8:09 AM EST ----- Regarding: FW: call  ----- Message ----- From: Derwood Kaplan, MD Sent: 07/11/2022   6:53 PM EST To: Vickki Hearing; Belva Chimes, LPN Subject: call                                           Tell her calcium and protein are mildly low but I am really concerned about her anemia.  Red cells have dropped quite a bit since last time, I will check vitamin levels  Einar Pheasant, would you add anemia eval to labs from 2/14?

## 2022-07-12 NOTE — Telephone Encounter (Signed)
Left a message for patient to call me back regarding lab results. Spoke with Einar Pheasant in the lab he is checking to see if a add on test can be done still or if we need a repeat lab visit.

## 2022-07-13 ENCOUNTER — Other Ambulatory Visit: Payer: Self-pay | Admitting: Oncology

## 2022-07-13 ENCOUNTER — Other Ambulatory Visit: Payer: Self-pay

## 2022-07-13 DIAGNOSIS — R928 Other abnormal and inconclusive findings on diagnostic imaging of breast: Secondary | ICD-10-CM

## 2022-07-13 DIAGNOSIS — C50411 Malignant neoplasm of upper-outer quadrant of right female breast: Secondary | ICD-10-CM

## 2022-07-13 DIAGNOSIS — D638 Anemia in other chronic diseases classified elsewhere: Secondary | ICD-10-CM

## 2022-07-13 LAB — VITAMIN B12: Vitamin B-12: 363 pg/mL (ref 180–914)

## 2022-07-13 LAB — IRON AND TIBC
Iron: 29 ug/dL (ref 28–170)
Saturation Ratios: 9 % — ABNORMAL LOW (ref 10.4–31.8)
TIBC: 329 ug/dL (ref 250–450)
UIBC: 300 ug/dL

## 2022-07-13 LAB — FOLATE: Folate: 28.9 ng/mL (ref >5.9)

## 2022-07-13 LAB — FERRITIN: Ferritin: 8 ng/mL — ABNORMAL LOW (ref 11–307)

## 2022-07-14 DIAGNOSIS — F32A Depression, unspecified: Secondary | ICD-10-CM | POA: Diagnosis not present

## 2022-07-16 ENCOUNTER — Other Ambulatory Visit: Payer: PPO

## 2022-07-18 ENCOUNTER — Other Ambulatory Visit: Payer: Self-pay | Admitting: Oncology

## 2022-07-18 ENCOUNTER — Other Ambulatory Visit: Payer: Self-pay

## 2022-07-18 ENCOUNTER — Encounter: Payer: Self-pay | Admitting: Hematology and Oncology

## 2022-07-18 DIAGNOSIS — D500 Iron deficiency anemia secondary to blood loss (chronic): Secondary | ICD-10-CM

## 2022-07-20 ENCOUNTER — Other Ambulatory Visit: Payer: Self-pay

## 2022-07-20 DIAGNOSIS — D500 Iron deficiency anemia secondary to blood loss (chronic): Secondary | ICD-10-CM

## 2022-07-26 ENCOUNTER — Ambulatory Visit
Admission: RE | Admit: 2022-07-26 | Discharge: 2022-07-26 | Disposition: A | Discharge status: Home / Self Care | Payer: PPO | Source: Ambulatory Visit | Attending: Oncology | Admitting: Oncology

## 2022-07-26 ENCOUNTER — Other Ambulatory Visit: Payer: Self-pay | Admitting: Oncology

## 2022-07-26 DIAGNOSIS — R928 Other abnormal and inconclusive findings on diagnostic imaging of breast: Secondary | ICD-10-CM

## 2022-07-26 DIAGNOSIS — R921 Mammographic calcification found on diagnostic imaging of breast: Secondary | ICD-10-CM | POA: Diagnosis not present

## 2022-07-27 ENCOUNTER — Encounter: Payer: Self-pay | Admitting: Oncology

## 2022-07-28 ENCOUNTER — Encounter: Payer: Self-pay | Admitting: Radiology

## 2022-08-04 ENCOUNTER — Other Ambulatory Visit: Payer: Self-pay | Admitting: Oncology

## 2022-08-04 ENCOUNTER — Ambulatory Visit
Admission: RE | Admit: 2022-08-04 | Discharge: 2022-08-04 | Disposition: A | Discharge status: Home / Self Care | Payer: PPO | Source: Ambulatory Visit | Attending: Oncology | Admitting: Oncology

## 2022-08-04 DIAGNOSIS — R928 Other abnormal and inconclusive findings on diagnostic imaging of breast: Secondary | ICD-10-CM

## 2022-08-04 DIAGNOSIS — N6489 Other specified disorders of breast: Secondary | ICD-10-CM | POA: Diagnosis not present

## 2022-08-04 DIAGNOSIS — R921 Mammographic calcification found on diagnostic imaging of breast: Secondary | ICD-10-CM

## 2022-08-04 HISTORY — PX: BREAST BIOPSY: SHX20

## 2022-08-07 ENCOUNTER — Other Ambulatory Visit: Payer: Self-pay | Admitting: Hematology and Oncology

## 2022-08-07 DIAGNOSIS — Z170 Estrogen receptor positive status [ER+]: Secondary | ICD-10-CM

## 2022-08-08 ENCOUNTER — Telehealth: Payer: Self-pay

## 2022-08-08 NOTE — Telephone Encounter (Signed)
-----   Message from Derwood Kaplan, MD sent at 08/05/2022  7:17 PM EDT ----- Regarding: call Wonderful news on the biopsy.  So I will just see her in August as scheduled

## 2022-08-11 ENCOUNTER — Encounter: Payer: Self-pay | Admitting: Hematology and Oncology

## 2022-08-11 ENCOUNTER — Ambulatory Visit (INDEPENDENT_AMBULATORY_CARE_PROVIDER_SITE_OTHER): Payer: PPO | Admitting: Family Medicine

## 2022-08-11 VITALS — BP 134/78 | HR 100 | Temp 96.7°F | Ht 67.0 in | Wt 248.0 lb

## 2022-08-11 DIAGNOSIS — I480 Paroxysmal atrial fibrillation: Secondary | ICD-10-CM | POA: Diagnosis not present

## 2022-08-11 DIAGNOSIS — I700 Atherosclerosis of aorta: Secondary | ICD-10-CM

## 2022-08-11 DIAGNOSIS — C50411 Malignant neoplasm of upper-outer quadrant of right female breast: Secondary | ICD-10-CM | POA: Diagnosis not present

## 2022-08-11 DIAGNOSIS — E039 Hypothyroidism, unspecified: Secondary | ICD-10-CM

## 2022-08-11 DIAGNOSIS — Z170 Estrogen receptor positive status [ER+]: Secondary | ICD-10-CM | POA: Diagnosis not present

## 2022-08-11 DIAGNOSIS — M7542 Impingement syndrome of left shoulder: Secondary | ICD-10-CM | POA: Diagnosis not present

## 2022-08-11 DIAGNOSIS — E782 Mixed hyperlipidemia: Secondary | ICD-10-CM | POA: Diagnosis not present

## 2022-08-11 DIAGNOSIS — M545 Low back pain, unspecified: Secondary | ICD-10-CM

## 2022-08-11 DIAGNOSIS — K219 Gastro-esophageal reflux disease without esophagitis: Secondary | ICD-10-CM

## 2022-08-11 NOTE — Progress Notes (Signed)
Subjective:  Patient ID: Monique Kelly, female    DOB: 12-27-1955  Age: 67 y.o. MRN: [id number]  Chief Complaint  Patient presents with   Hypothyroidism   Hyperlipidemia   Hypertension    HPI: GERD: Taking omeprazole 40 mg daily   A. FIB: Taking Metoprolol 25 mg daily as needed.    Hypothyroidism: Takes synthroid 75 mcg daily  Hyperlipidemia: on repatha 140 mg IM every 2 weeks.   Depression: Taking wellbutrin 450 mg daily   Lumbar back pain: spinal ablation did not help. Gabapentin 600 mg two times in am and one in pm.  Cymbalta 30 mg daily. Felt liked helped initially, but it has decreased. When I increased her to 60 mg before bed she developed dizziness and nausea.  Patient is planning to get spine stimulator.   Breast cancer: on letrozole 2.5 mg daily.  On reclast IV annually through Dr. Hinton Rao.       08/11/2022    9:51 AM 06/23/2022    1:51 PM 03/24/2022    2:13 PM 08/04/2021   11:49 AM 03/19/2021   11:02 AM  Depression screen PHQ 2/9  Decreased Interest 0 0 1 0 2  Down, Depressed, Hopeless 0 0 1 0 1  PHQ - 2 Score 0 0 2 0 3  Altered sleeping  3 3  3   Tired, decreased energy  3 3  3   Change in appetite  0 0  0  Feeling bad or failure about yourself   0 0  0  Trouble concentrating  1 1  2   Moving slowly or fidgety/restless  0 0  0  Suicidal thoughts  0 0  0  PHQ-9 Score  7 9  11   Difficult doing work/chores  Not difficult at all Somewhat difficult  Somewhat difficult         03/24/2022    2:24 PM 03/24/2022    2:37 PM 05/10/2022   10:57 AM 06/23/2022    1:50 PM 08/11/2022    9:51 AM  Fall Risk  Falls in the past year? 1  1 1 1   Was there an injury with Fall? 1  0 0 0  Was there an injury with Fall? - Comments  fell and hit her face on one of them. 2-3 months ago. no loc.     Fall Risk Category Calculator 3  2 2 2   Fall Risk Category (Retired) High  Moderate    (RETIRED) Patient Fall Risk Level High fall risk  High fall risk    Patient at Risk for Falls  Due to History of fall(s) Impaired balance/gait;Impaired mobility History of fall(s) History of fall(s) History of fall(s)  Fall risk Follow up Education provided Falls prevention discussed Falls evaluation completed Falls evaluation completed Falls evaluation completed      Review of Systems  Constitutional:  Negative for chills, fatigue and fever.  HENT:  Negative for congestion, ear pain, rhinorrhea and sore throat.   Respiratory:  Negative for cough and shortness of breath.   Cardiovascular:  Negative for chest pain.  Gastrointestinal:  Negative for abdominal pain, constipation, diarrhea, nausea and vomiting.  Genitourinary:  Negative for dysuria and urgency.  Musculoskeletal:  Positive for back pain, myalgias and neck pain.  Neurological:  Negative for dizziness, weakness, light-headedness and headaches.  Psychiatric/Behavioral:  Negative for dysphoric mood. The patient is not nervous/anxious.     Current Outpatient Medications on File Prior to Visit  Medication Sig Dispense Refill   buPROPion [facility]  XL) 150 MG 24 hr tablet Take 150 mg by mouth daily.     buPROPion (WELLBUTRIN XL) 300 MG 24 hr tablet Take 300 mg by mouth daily.     calcium carbonate (OS-CAL - DOSED IN MG OF ELEMENTAL CALCIUM) 1250 (500 Ca) MG tablet Take 1 tablet by mouth daily with breakfast.     cyanocobalamin 1000 MCG tablet Take by mouth.     DULoxetine (CYMBALTA) 30 MG capsule Take 1 capsule (30 mg total) by mouth at bedtime. (Patient not taking: Reported on 06/23/2022) 30 capsule 1   ergocalciferol (VITAMIN D2) 1.25 MG (50000 UT) capsule Take by mouth.     Evolocumab (REPATHA SURECLICK) [id number] MG/ML SOAJ Inject 140 mg into the skin every 14 (fourteen) days. 2 mL 12   furosemide (LASIX) 20 MG tablet Take 1 tablet (20 mg total) by mouth daily as needed for edema. 30 tablet 0   gabapentin (NEURONTIN) 600 MG tablet Take 1 tablet (600 mg total) by mouth 3 (three) times daily. 270 tablet 0   letrozole (FEMARA) 2.5 MG  tablet TAKE ONE TABLET BY MOUTH EVERY DAY 90 tablet 3   levothyroxine (SYNTHROID) 75 MCG tablet Take 75 mcg by mouth daily.      metoprolol tartrate (LOPRESSOR) 25 MG tablet Take 1 tablet (25 mg total) by mouth daily as needed (PALPITATION/ AFIB). 90 tablet 0   omeprazole (PRILOSEC) 40 MG capsule Take 1 capsule (40 mg total) by mouth in the morning and at bedtime. 180 capsule 3   Zoledronic Acid (RECLAST IV) Inject into the vein.     No current facility-administered medications on file prior to visit.   Past Medical History:  Diagnosis Date   Atrial fibrillation [facility]) 2016   Sees Dr. [name]. Cardiac ablation, loop recorder (removed in 2022)   Atrial fibrillation with rapid ventricular response ([place]) 04/2015   Breast cancer ([place])    right lumpectomy   BRONCHITIS 03/10/2009   Qualifier: Diagnosis of  By: Geoffry Paradise MD, Kristine Royal    Chest pain with moderate risk for cardiac etiology 04/28/2015   Dehydration 06/18/2009   Qualifier: Diagnosis of  By: Pete Pelt     Depression    Family history of breast cancer    Family history of colon cancer    Fibromyalgia 2010   Hyperlipidemia    Hypothyroidism (acquired) 2006   s/p thyroidectomy for nodules   Morbid obesity ([place]) 08/10/2015   Obesity    gastric bpg 2011, weight then 400 lbs, lost 100 lbs in 60 days.   Obstructive sleep apnea    does not use cpap, does not have a following physician   Paroxysmal atrial fibrillation ([place]) 08/10/2015   Past Surgical History:  Procedure Laterality Date   ABDOMINAL HYSTERECTOMY  1992   total.    APPENDECTOMY     BREAST BIOPSY Left 08/04/2022   MM LT BREAST BX W LOC DEV 1ST LESION IMAGE BX SPEC STEREO GUIDE 08/04/2022 [facility]   BREAST BIOPSY Left 08/12/2022   MM LT BREAST BX W LOC DEV 1ST LESION IMAGE BX SPEC STEREO GUIDE 08/12/2022 [facility]   BREAST SURGERY  2019   right lumpectomy    CHOLECYSTECTOMY     COLONOSCOPY     ELECTROPHYSIOLOGIC STUDY N/A 09/08/2015    Procedure: Atrial Fibrillation Ablation;  Surgeon: [name], MD;  Location: [place] [facility];  Service: Cardiovascular;  Laterality: N/A;   EP IMPLANTABLE DEVICE N/A 12/17/2015   Procedure: Loop Recorder Insertion;  Surgeon: [name], MD;  Location: [place] [facility];  Service: Cardiovascular;  Laterality: N/A;   ESOPHAGOGASTRODUODENOSCOPY     EXPLORATORY LAPAROTOMY WITH ABDOMINAL MASS EXCISION  2018   GASTRIC BYPASS  2011   lost 100 lbs, but gained some back.   implantable loop recorder removal  03/25/2020   removed 2022   NECK SURGERY  2005   laminectomy?   THYROIDECTOMY  2006   for nodules ( hot and cold nodules) Goiter compressing trachea.    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 Works as Technical brewer for [employer].   Right handed   Social Determinants of Health   Financial Resource Strain: Medium Risk (03/24/2022)   Overall Financial Resource Strain (CARDIA)    Difficulty of Paying Living Expenses: Somewhat hard  Food Insecurity: No Food Insecurity (03/24/2022)   Hunger Vital Sign    Worried About  Running Out of Food in the Last Year: Never true    [place] in the Last Year: Never true  Transportation Needs: No Transportation Needs (03/24/2022)   PRAPARE - Hydrologist (Medical): No    Lack of Transportation (Non-Medical): No  Physical Activity: Inactive (03/24/2022)   Exercise Vital Sign    Days of Exercise per Week: 0 days    Minutes of Exercise per Session: 0 min  Stress: Stress Concern Present (03/24/2022)   [place]    Feeling of Stress : Rather much  Social Connections: Socially Isolated (03/24/2022)   Social Connection and Isolation Panel [NHANES]    Frequency of Communication with Friends and Family: More than three times a week    Frequency of Social Gatherings with Friends and Family: Twice a week    Attends Religious Services: Never    Printmaker: No    Attends Music therapist: Never    Marital Status: Never married    Objective:  BP 134/78   Pulse 100   Temp (!) 96.7 F (35.9 C)   Ht 5\' 7"  (1.702 m)   Wt 248 lb (112.5 kg)   SpO2 96%   BMI 38.84 kg/m      08/11/2022    9:48 AM 07/11/2022    2:38 PM 07/06/2022    3:05 PM  BP/Weight  Systolic BP [id number]  098  Diastolic BP 78 70 80  Wt. (Lbs) 248 249.12 249.7  BMI 38.84 kg/m2 39.02 kg/m2 39.11 kg/m2    Physical Exam Vitals reviewed.  Constitutional:      Appearance: Normal appearance. She is obese.  Neck:     Vascular: No carotid bruit.  Cardiovascular:     Rate and Rhythm: Normal rate and regular rhythm.     Heart sounds: Normal heart sounds.  Pulmonary:     Effort: Pulmonary effort is normal. No respiratory distress.     Breath sounds: Normal breath sounds.  Abdominal:     General: Abdomen is flat. Bowel sounds are normal.     Palpations: Abdomen is soft.     Tenderness: There is no abdominal tenderness.  Musculoskeletal:        General: Tenderness (FM  trigger points.) present.  Neurological:     Mental Status: She is alert and oriented to person, place, and time.  Psychiatric:        Mood and Affect: Mood normal.        Behavior: Behavior normal.     Diabetic Foot Exam - Simple   No data filed      Lab Results  Component Value Date   WBC 7.0 08/11/2022   HGB 10.6 (L) 08/11/2022   HCT 34.9 08/11/2022   PLT 277 08/11/2022   GLUCOSE 85 08/11/2022   CHOL 120 05/10/2022   TRIG 88 05/10/2022   HDL 57 05/10/2022   LDLCALC 46 05/10/2022   ALT 12 08/11/2022   AST 12 08/11/2022   NA 145 (H) 08/11/2022   K 4.5 08/11/2022   CL 107 (H) 08/11/2022   CREATININE 0.92 08/11/2022   BUN 11 08/11/2022   CO2 24 08/11/2022   TSH 1.380 08/11/2022      Assessment & Plan:    Mixed hyperlipidemia Assessment & Plan: Well controlled.  No changes to medicines. Continue repatha Continue to work on eating a healthy diet and exercise.      Lumbar back pain Assessment & Plan: Management per specialist.     Hypothyroidism (acquired) Assessment & Plan: Previously well controlled Continue Synthroid at current dose  Recheck TSH and adjust Synthroid as indicated    Orders: -     TSH  Gastroesophageal reflux disease without esophagitis Assessment & Plan: The current medical regimen is effective;  continue present plan and medications. Continue omeprazole 40 mg daily   Orders: -     CBC with Differential/Platelet -     Comprehensive metabolic panel  Impingement syndrome of left shoulder Assessment & Plan: Management per specialist.   Paroxysmal atrial fibrillation [facility]) Assessment & Plan: The current medical regimen is effective;  continue present plan and medications. Continue taking Metoprolol 25 mg daily as needed.    Malignant neoplasm of upper-outer quadrant of right breast in female, estrogen receptor positive ([place]) Assessment & Plan: The current medical regimen is effective;  continue present plan and  medications. Continue letrozole 2.5 mg daily.    Aortic atherosclerosis (HCC) Assessment & Plan: Continue repatha 140 mg IM every 2 weeks.       No orders of the defined types were placed in this encounter.   Orders Placed This Encounter  Procedures   CBC with Differential/Platelet   Comprehensive metabolic panel   TSH     Follow-up: Return in about 3 months (around 11/11/2022) for chronic fasting.   Geralynn Ochs I Leal-Borjas,acting as a scribe for Rochel Brome, MD.,have documented all relevant documentation on  the behalf of Rochel Brome, MD,as directed by  Rochel Brome, MD while in the presence of Rochel Brome, MD.    An After Visit Summary was printed and given to the patient.  I attest that I have reviewed this visit and agree with the plan scribed by my staff.   Rochel Brome, MD [facility] [phone]

## 2022-08-12 ENCOUNTER — Ambulatory Visit
Admission: RE | Admit: 2022-08-12 | Discharge: 2022-08-12 | Disposition: A | Discharge status: Home / Self Care | Payer: PPO | Source: Ambulatory Visit | Attending: Oncology | Admitting: Oncology

## 2022-08-12 DIAGNOSIS — M8580 Other specified disorders of bone density and structure, unspecified site: Secondary | ICD-10-CM

## 2022-08-12 DIAGNOSIS — N6032 Fibrosclerosis of left breast: Secondary | ICD-10-CM | POA: Diagnosis not present

## 2022-08-12 DIAGNOSIS — R928 Other abnormal and inconclusive findings on diagnostic imaging of breast: Secondary | ICD-10-CM

## 2022-08-12 DIAGNOSIS — C50411 Malignant neoplasm of upper-outer quadrant of right female breast: Secondary | ICD-10-CM

## 2022-08-12 DIAGNOSIS — F331 Major depressive disorder, recurrent, moderate: Secondary | ICD-10-CM

## 2022-08-12 DIAGNOSIS — R921 Mammographic calcification found on diagnostic imaging of breast: Secondary | ICD-10-CM | POA: Diagnosis not present

## 2022-08-12 HISTORY — PX: BREAST BIOPSY: SHX20

## 2022-08-12 LAB — COMPREHENSIVE METABOLIC PANEL
ALT: 12 IU/L (ref 0–32)
AST: 12 IU/L (ref 0–40)
Albumin/Globulin Ratio: 1.5 (ref 1.2–2.2)
Albumin: 3.5 g/dL — ABNORMAL LOW (ref 3.9–4.9)
Alkaline Phosphatase: 98 IU/L (ref 44–121)
BUN/Creatinine Ratio: 12 (ref 12–28)
BUN: 11 mg/dL (ref 8–27)
Bilirubin Total: 0.4 mg/dL (ref 0.0–1.2)
CO2: 24 mmol/L (ref 20–29)
Calcium: 8.7 mg/dL (ref 8.7–10.3)
Chloride: 107 mmol/L — ABNORMAL HIGH (ref 96–106)
Creatinine, Ser: 0.92 mg/dL (ref 0.57–1.00)
Globulin, Total: 2.4 g/dL (ref 1.5–4.5)
Glucose: 85 mg/dL (ref 70–99)
Potassium: 4.5 mmol/L (ref 3.5–5.2)
Sodium: 145 mmol/L — ABNORMAL HIGH (ref 134–144)
Total Protein: 5.9 g/dL — ABNORMAL LOW (ref 6.0–8.5)
eGFR: 69 mL/min/{1.73_m2} (ref >59)

## 2022-08-12 LAB — CBC WITH DIFFERENTIAL/PLATELET
Basophils Absolute: 0 10*3/uL (ref 0.0–0.2)
Basos: 0 %
EOS (ABSOLUTE): 0.1 10*3/uL (ref 0.0–0.4)
Eos: 1 %
Hematocrit: 34.9 % (ref 34.0–46.6)
Hemoglobin: 10.6 g/dL — ABNORMAL LOW (ref 11.1–15.9)
Immature Grans (Abs): 0 10*3/uL (ref 0.0–0.1)
Immature Granulocytes: 0 %
Lymphocytes Absolute: 1.2 10*3/uL (ref 0.7–3.1)
Lymphs: 16 %
MCH: 26.7 pg (ref 26.6–33.0)
MCHC: 30.4 g/dL — ABNORMAL LOW (ref 31.5–35.7)
MCV: 88 fL (ref 79–97)
Monocytes Absolute: 0.5 10*3/uL (ref 0.1–0.9)
Monocytes: 7 %
Neutrophils Absolute: 5.3 10*3/uL (ref 1.4–7.0)
Neutrophils: 76 %
Platelets: 277 10*3/uL (ref 150–450)
RBC: 3.97 x10E6/uL (ref 3.77–5.28)
RDW: 16.5 % — ABNORMAL HIGH (ref 11.7–15.4)
WBC: 7 10*3/uL (ref 3.4–10.8)

## 2022-08-12 LAB — TSH: TSH: 1.38 u[IU]/mL (ref 0.450–4.500)

## 2022-08-14 ENCOUNTER — Encounter: Payer: Self-pay | Admitting: Family Medicine

## 2022-08-14 NOTE — Assessment & Plan Note (Signed)
Well controlled.  No changes to medicines. Continue repatha Continue to work on eating a healthy diet and exercise.    

## 2022-08-14 NOTE — Assessment & Plan Note (Signed)
The current medical regimen is effective;  continue present plan and medications. Continue taking Metoprolol 25 mg daily as needed.

## 2022-08-14 NOTE — Assessment & Plan Note (Addendum)
The current medical regimen is effective;  continue present plan and medications. Continue omeprazole 40 mg daily.  

## 2022-08-14 NOTE — Assessment & Plan Note (Signed)
Continue repatha 140 mg IM every 2 weeks.

## 2022-08-14 NOTE — Assessment & Plan Note (Signed)
Previously well controlled Continue Synthroid at current dose  Recheck TSH and adjust Synthroid as indicated   

## 2022-08-14 NOTE — Assessment & Plan Note (Signed)
The current medical regimen is effective;  continue present plan and medications. Continue letrozole 2.5 mg daily.

## 2022-08-14 NOTE — Assessment & Plan Note (Signed)
Management per specialist. 

## 2022-08-15 DIAGNOSIS — M5416 Radiculopathy, lumbar region: Secondary | ICD-10-CM | POA: Diagnosis not present

## 2022-08-15 DIAGNOSIS — M47817 Spondylosis without myelopathy or radiculopathy, lumbosacral region: Secondary | ICD-10-CM | POA: Diagnosis not present

## 2022-08-30 ENCOUNTER — Other Ambulatory Visit: Payer: Self-pay

## 2022-08-30 MED ORDER — GABAPENTIN 600 MG PO TABS: 600.0000 mg | ORAL_TABLET | Freq: Three times a day (TID) | ORAL | 0 refills | Status: DC

## 2022-09-08 DIAGNOSIS — M4315 Spondylolisthesis, thoracolumbar region: Secondary | ICD-10-CM | POA: Diagnosis not present

## 2022-09-08 DIAGNOSIS — M5416 Radiculopathy, lumbar region: Secondary | ICD-10-CM | POA: Diagnosis not present

## 2022-09-08 DIAGNOSIS — M5415 Radiculopathy, thoracolumbar region: Secondary | ICD-10-CM | POA: Diagnosis not present

## 2022-09-14 DIAGNOSIS — M5416 Radiculopathy, lumbar region: Secondary | ICD-10-CM | POA: Diagnosis not present

## 2022-09-14 DIAGNOSIS — M47816 Spondylosis without myelopathy or radiculopathy, lumbar region: Secondary | ICD-10-CM | POA: Diagnosis not present

## 2022-09-15 DIAGNOSIS — K219 Gastro-esophageal reflux disease without esophagitis: Secondary | ICD-10-CM | POA: Diagnosis not present

## 2022-09-15 DIAGNOSIS — D649 Anemia, unspecified: Secondary | ICD-10-CM | POA: Diagnosis not present

## 2022-09-15 DIAGNOSIS — K289 Gastrojejunal ulcer, unspecified as acute or chronic, without hemorrhage or perforation: Secondary | ICD-10-CM | POA: Diagnosis not present

## 2022-09-19 ENCOUNTER — Encounter: Payer: Self-pay | Admitting: Family Medicine

## 2022-09-19 ENCOUNTER — Other Ambulatory Visit: Payer: Self-pay

## 2022-09-19 ENCOUNTER — Ambulatory Visit (INDEPENDENT_AMBULATORY_CARE_PROVIDER_SITE_OTHER): Payer: PPO | Admitting: Family Medicine

## 2022-09-19 VITALS — BP 110/70 | HR 87 | Temp 97.6°F | Ht 67.0 in | Wt 250.0 lb

## 2022-09-19 DIAGNOSIS — R29898 Other symptoms and signs involving the musculoskeletal system: Secondary | ICD-10-CM | POA: Diagnosis not present

## 2022-09-19 DIAGNOSIS — D508 Other iron deficiency anemias: Secondary | ICD-10-CM | POA: Diagnosis not present

## 2022-09-19 DIAGNOSIS — F330 Major depressive disorder, recurrent, mild: Secondary | ICD-10-CM | POA: Diagnosis not present

## 2022-09-19 DIAGNOSIS — D510 Vitamin B12 deficiency anemia due to intrinsic factor deficiency: Secondary | ICD-10-CM | POA: Diagnosis not present

## 2022-09-19 DIAGNOSIS — R0602 Shortness of breath: Secondary | ICD-10-CM | POA: Diagnosis not present

## 2022-09-19 DIAGNOSIS — Z6839 Body mass index (BMI) 39.0-39.9, adult: Secondary | ICD-10-CM

## 2022-09-19 DIAGNOSIS — D649 Anemia, unspecified: Secondary | ICD-10-CM | POA: Diagnosis not present

## 2022-09-19 MED ORDER — BUPROPION HCL ER (XL) 150 MG PO TB24: 450.0000 mg | ORAL_TABLET | Freq: Every day | ORAL | 1 refills | Status: DC

## 2022-09-19 MED ORDER — GABAPENTIN 600 MG PO TABS: 600.0000 mg | ORAL_TABLET | Freq: Three times a day (TID) | ORAL | 1 refills | Status: DC

## 2022-09-19 NOTE — Progress Notes (Unsigned)
Subjective:  Patient ID: Monique Kelly, female    DOB: Feb 09, 1956  Age: 67 y.o. MRN: [id number]  Chief Complaint  Patient presents with   Discuss Test Results    HPI Wants to discuss results from Dr. Jill Alexanders test (spinal cord stimulator trial) states that she had to be given O2 during trial. Patient has to have it at [facility].  Dr. Gilman Buttner called patient and she was anemic and concern for gi bleed. Patient scheduled for colonoscopy and egd with Dr. Jennye Boroughs on may 14th.   Depression: Wellbutrin 450 mg total wants to know if she can take just one tablet vs two different doses. Is not taking Cymbalta 30 mg daily. She ran out. Has not noticed a difference.      08/11/2022    9:51 AM 06/23/2022    1:51 PM 03/24/2022    2:13 PM 08/04/2021   11:49 AM 03/19/2021   11:02 AM  Depression screen PHQ 2/9  Decreased Interest 0 0 1 0 2  Down, Depressed, Hopeless 0 0 1 0 1  PHQ - 2 Score 0 0 2 0 3  Altered sleeping  3 3  3   Tired, decreased energy  3 3  3   Change in appetite  0 0  0  Feeling bad or failure about yourself   0 0  0  Trouble concentrating  1 1  2   Moving slowly or fidgety/restless  0 0  0  Suicidal thoughts  0 0  0  PHQ-9 Score  7 9  11   Difficult doing work/chores  Not difficult at all Somewhat difficult  Somewhat difficult         03/24/2022    2:24 PM 03/24/2022    2:37 PM 05/10/2022   10:57 AM 06/23/2022    1:50 PM 08/11/2022    9:51 AM  Fall Risk  Falls in the past year? 1  1 1 1   Was there an injury with Fall? 1  0 0 0  Was there an injury with Fall? - Comments  fell and hit her face on one of them. 2-3 months ago. no loc.     Fall Risk Category Calculator 3  2 2 2   Fall Risk Category (Retired) High  Moderate    (RETIRED) Patient Fall Risk Level High fall risk  High fall risk    Patient at Risk for Falls Due to History of fall(s) Impaired balance/gait;Impaired mobility History of fall(s) History of fall(s) History of fall(s)  Fall risk Follow up  Education provided Falls prevention discussed Falls evaluation completed Falls evaluation completed Falls evaluation completed      Review of Systems  Current Outpatient Medications on File Prior to Visit  Medication Sig Dispense Refill   buPROPion (WELLBUTRIN XL) 150 MG 24 hr tablet Take 150 mg by mouth daily.     buPROPion (WELLBUTRIN XL) 300 MG 24 hr tablet Take 300 mg by mouth daily.     calcium carbonate (OS-CAL - DOSED IN MG OF ELEMENTAL CALCIUM) 1250 (500 Ca) MG tablet Take 1 tablet by mouth daily with breakfast.     cyanocobalamin 1000 MCG tablet Take by mouth.     DULoxetine (CYMBALTA) 30 MG capsule Take 1 capsule (30 mg total) by mouth at bedtime. (Patient not taking: Reported on 06/23/2022) 30 capsule 1   ergocalciferol (VITAMIN D2) 1.25 MG (50000 UT) capsule Take by mouth.     Evolocumab (REPATHA SURECLICK) 140 MG/ML SOAJ Inject 140 mg into the skin every  14 (fourteen) days. 2 mL 12   furosemide (LASIX) 20 MG tablet Take 1 tablet (20 mg total) by mouth daily as needed for edema. 30 tablet 0   letrozole (FEMARA) 2.5 MG tablet TAKE ONE TABLET BY MOUTH EVERY DAY 90 tablet 3   levothyroxine (SYNTHROID) 75 MCG tablet Take 75 mcg by mouth daily.      metoprolol tartrate (LOPRESSOR) 25 MG tablet Take 1 tablet (25 mg total) by mouth daily as needed (PALPITATION/ AFIB). 90 tablet 0   omeprazole (PRILOSEC) 40 MG capsule Take 1 capsule (40 mg total) by mouth in the morning and at bedtime. 180 capsule 3   Zoledronic Acid (RECLAST IV) Inject into the vein.     No current facility-administered medications on file prior to visit.   Past Medical History:  Diagnosis Date   Atrial fibrillation [facility]) 2016   Sees Dr. Hillis Range. Cardiac ablation, loop recorder (removed in 2022)   Atrial fibrillation with rapid ventricular response (HCC) 04/2015   Breast cancer (HCC)    right lumpectomy   BRONCHITIS 03/10/2009   Qualifier: Diagnosis of  By: Alto Denver MD, Grayland Ormond    Chest pain with moderate risk  for cardiac etiology 04/28/2015   Dehydration 06/18/2009   Qualifier: Diagnosis of  By: Mike Gip     Depression    Family history of breast cancer    Family history of colon cancer    Fibromyalgia 2010   Hyperlipidemia    Hypothyroidism (acquired) 2006   s/p thyroidectomy for nodules   Morbid obesity (HCC) 08/10/2015   Obesity    gastric bpg 2011, weight then 400 lbs, lost 100 lbs in 60 days.   Obstructive sleep apnea    does not use cpap, does not have a following physician   Paroxysmal atrial fibrillation (HCC) 08/10/2015   Past Surgical History:  Procedure Laterality Date   ABDOMINAL HYSTERECTOMY  1992   total.    APPENDECTOMY     BREAST BIOPSY Left 08/04/2022   MM LT BREAST BX W LOC DEV 1ST LESION IMAGE BX SPEC STEREO GUIDE 08/04/2022 [facility]   BREAST BIOPSY Left 08/12/2022   MM LT BREAST BX W LOC DEV 1ST LESION IMAGE BX SPEC STEREO GUIDE 08/12/2022 [facility]   BREAST SURGERY  2019   right lumpectomy    CHOLECYSTECTOMY     COLONOSCOPY     ELECTROPHYSIOLOGIC STUDY N/A 09/08/2015   Procedure: Atrial Fibrillation Ablation;  Surgeon: Hillis Range, MD;  Location: [facility];  Service: Cardiovascular;  Laterality: N/A;   EP IMPLANTABLE DEVICE N/A 12/17/2015   Procedure: Loop Recorder Insertion;  Surgeon: Hillis Range, MD;  Location: [facility] INVAS[facility];  Service: Cardiovascular;  Laterality: N/A;   ESOPHAGOGASTRODUODENOSCOPY     EXPLORATORY LAPAROTOMY WITH ABDOMINAL MASS EXCISION  2018   GASTRIC BYPASS  2011   lost 100 lbs, but gained some back.   implantable loop recorder removal  03/25/2020   removed 2022   NECK SURGERY  2005   laminectomy?   THYROIDECTOMY  2006   for nodules ( hot and cold nodules) Goiter compressing trachea.    Family History  Problem Relation Age of Onset   Heart failure Father    Cancer Father        colon   Heart failure Maternal Grandmother    Heart failure Maternal Grandfather    Cancer Mother        breast    Social History   Socioeconomic History  Marital status: Single    Spouse name: Not on file   Number of children: Not on file   Years of education: Not on file   Highest education level: Bachelor's degree (e.g., BA, AB, BS)  Occupational History   Occupation: Economist  Tobacco Use   Smoking status: Never   Smokeless tobacco: Never  Vaping Use   Vaping Use: Never used  Substance and Sexual Activity   Alcohol use: Yes    Alcohol/week: 1.0 standard drink of alcohol    Types: 1 Standard drinks or equivalent per week    Comment: rare glass of wine   Drug use: No   Sexual activity: Not Currently  Other Topics Concern   Not on file  Social History Narrative   Pt lives in [place] alone. 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   Social Determinants of Health   Financial Resource Strain: High Risk (09/15/2022)   Overall Financial Resource Strain (CARDIA)    Difficulty of Paying Living Expenses: Hard  Food Insecurity: No Food Insecurity (09/15/2022)   Hunger Vital Sign    Worried About Running Out of Food in the Last Year: Never true    Ran Out of Food in the Last Year: Never true  Transportation Needs: No Transportation Needs (09/15/2022)   PRAPARE - Administrator, Civil Service (Medical): No    Lack of Transportation (Non-Medical): No  Physical Activity: Inactive (09/15/2022)   Exercise Vital Sign    Days of Exercise per Week: 0 days    Minutes of Exercise per Session: 0 min  Stress: Stress Concern Present (09/15/2022)   [employer] of Occupational Health - Occupational Stress Questionnaire    Feeling of Stress : To some extent  Social Connections: Unknown (09/15/2022)   Social Connection and Isolation Panel [NHANES]    Frequency of Communication with Friends and Family: More than three times a week    Frequency of Social Gatherings with Friends and Family: Three times a week    Attends Religious Services: Patient  declined    Active Member of Clubs or Organizations: No    Attends Banker Meetings: Never    Marital Status: Never married    Objective:  BP 110/70   Pulse 87   Temp 97.6 F (36.4 C)   Ht 5\' 7"  (1.702 m)   Wt 250 lb (113.4 kg)   SpO2 95%   BMI 39.16 kg/m      09/19/2022    3:01 PM 08/11/2022    9:48 AM 07/11/2022    2:38 PM  BP/Weight  Systolic BP 110 134 127  Diastolic BP 70 78 70  Wt. (Lbs) 250 248 249.12  BMI 39.16 kg/m2 38.84 kg/m2 39.02 kg/m2    Physical Exam  Diabetic Foot Exam - Simple   No data filed      Lab Results  Component Value Date   WBC 7.0 08/11/2022   HGB 10.6 (L) 08/11/2022   HCT 34.9 08/11/2022   PLT 277 08/11/2022   GLUCOSE 85 08/11/2022   CHOL 120 05/10/2022   TRIG 88 05/10/2022   HDL 57 05/10/2022   LDLCALC 46 05/10/2022   ALT 12 08/11/2022   AST 12 08/11/2022   NA 145 (H) 08/11/2022   K 4.5 08/11/2022   CL 107 (H) 08/11/2022   CREATININE 0.92 08/11/2022   BUN 11 08/11/2022   CO2 24 08/11/2022   TSH 1.380 08/11/2022  Assessment & Plan:    There are no diagnoses linked to this encounter.   No orders of the defined types were placed in this encounter.   No orders of the defined types were placed in this encounter.    Follow-up: No follow-ups on file.   I,Katherina A Bramblett,acting as a scribe for Blane Ohara, MD.,have documented all relevant documentation on the behalf of Blane Ohara, MD,as directed by  Blane Ohara, MD while in the presence of Blane Ohara, MD.   Clayborn Bigness I Leal-Borjas,acting as a scribe for Blane Ohara, MD.,have documented all relevant documentation on the behalf of Blane Ohara, MD,as directed by  Blane Ohara, MD while in the presence of Blane Ohara, MD.    An After Visit Summary was printed and given to the patient.  Blane Ohara, MD [facility] [phone]

## 2022-09-24 NOTE — Assessment & Plan Note (Addendum)
Ordered Peak flow. Normal.

## 2022-09-24 NOTE — Assessment & Plan Note (Signed)
Refer to physical therapy 

## 2022-09-25 ENCOUNTER — Encounter: Payer: Self-pay | Admitting: Family Medicine

## 2022-09-25 DIAGNOSIS — F330 Major depressive disorder, recurrent, mild: Secondary | ICD-10-CM | POA: Insufficient documentation

## 2022-09-25 NOTE — Assessment & Plan Note (Signed)
Continue healthy diet. Comorbidities: OSA, hyperlipidemia

## 2022-09-25 NOTE — Assessment & Plan Note (Signed)
Well controlled.  Change wellbutrin xl 150 mg 3 daily in am.

## 2022-09-25 NOTE — Assessment & Plan Note (Signed)
Keep appt with GI for EGD and Colonoscopy.

## 2022-10-04 DIAGNOSIS — M1990 Unspecified osteoarthritis, unspecified site: Secondary | ICD-10-CM | POA: Diagnosis not present

## 2022-10-04 DIAGNOSIS — I4891 Unspecified atrial fibrillation: Secondary | ICD-10-CM | POA: Diagnosis not present

## 2022-10-04 DIAGNOSIS — Z853 Personal history of malignant neoplasm of breast: Secondary | ICD-10-CM | POA: Diagnosis not present

## 2022-10-04 DIAGNOSIS — Z23 Encounter for immunization: Secondary | ICD-10-CM | POA: Diagnosis not present

## 2022-10-04 DIAGNOSIS — Z800 Family history of malignant neoplasm of digestive organs: Secondary | ICD-10-CM | POA: Diagnosis not present

## 2022-10-04 DIAGNOSIS — T451X5A Adverse effect of antineoplastic and immunosuppressive drugs, initial encounter: Secondary | ICD-10-CM | POA: Diagnosis not present

## 2022-10-04 DIAGNOSIS — Z8719 Personal history of other diseases of the digestive system: Secondary | ICD-10-CM | POA: Diagnosis not present

## 2022-10-04 DIAGNOSIS — D649 Anemia, unspecified: Secondary | ICD-10-CM | POA: Diagnosis not present

## 2022-10-04 DIAGNOSIS — Z9884 Bariatric surgery status: Secondary | ICD-10-CM | POA: Diagnosis not present

## 2022-10-04 DIAGNOSIS — K279 Peptic ulcer, site unspecified, unspecified as acute or chronic, without hemorrhage or perforation: Secondary | ICD-10-CM | POA: Diagnosis not present

## 2022-10-04 DIAGNOSIS — Z6839 Body mass index (BMI) 39.0-39.9, adult: Secondary | ICD-10-CM | POA: Diagnosis not present

## 2022-10-04 DIAGNOSIS — E785 Hyperlipidemia, unspecified: Secondary | ICD-10-CM | POA: Diagnosis not present

## 2022-10-04 DIAGNOSIS — G4733 Obstructive sleep apnea (adult) (pediatric): Secondary | ICD-10-CM | POA: Diagnosis not present

## 2022-10-04 DIAGNOSIS — G8929 Other chronic pain: Secondary | ICD-10-CM | POA: Diagnosis not present

## 2022-10-04 DIAGNOSIS — G620 Drug-induced polyneuropathy: Secondary | ICD-10-CM | POA: Diagnosis not present

## 2022-10-04 DIAGNOSIS — Z87440 Personal history of urinary (tract) infections: Secondary | ICD-10-CM | POA: Diagnosis not present

## 2022-10-05 DIAGNOSIS — I4891 Unspecified atrial fibrillation: Secondary | ICD-10-CM | POA: Diagnosis not present

## 2022-10-05 DIAGNOSIS — Z853 Personal history of malignant neoplasm of breast: Secondary | ICD-10-CM | POA: Diagnosis not present

## 2022-10-05 DIAGNOSIS — Z800 Family history of malignant neoplasm of digestive organs: Secondary | ICD-10-CM | POA: Diagnosis not present

## 2022-10-05 DIAGNOSIS — D649 Anemia, unspecified: Secondary | ICD-10-CM | POA: Diagnosis not present

## 2022-10-05 DIAGNOSIS — Z8719 Personal history of other diseases of the digestive system: Secondary | ICD-10-CM | POA: Diagnosis not present

## 2022-10-06 ENCOUNTER — Encounter: Payer: Self-pay | Admitting: Oncology

## 2022-10-06 ENCOUNTER — Encounter: Payer: Self-pay | Admitting: Family Medicine

## 2022-10-11 ENCOUNTER — Encounter: Payer: Self-pay | Admitting: Cardiology

## 2022-10-12 NOTE — Telephone Encounter (Signed)
Left message informing pt that I would respond via mychart, but if she would like to further verbally discuss, then I will be back in  the office Friday and to let me know if she would like a call.

## 2022-10-19 ENCOUNTER — Other Ambulatory Visit: Payer: Self-pay | Admitting: Family Medicine

## 2022-10-19 ENCOUNTER — Ambulatory Visit (INDEPENDENT_AMBULATORY_CARE_PROVIDER_SITE_OTHER): Payer: PPO | Admitting: Family Medicine

## 2022-10-19 VITALS — BP 112/78 | HR 86 | Temp 97.2°F | Ht 67.0 in | Wt 250.0 lb

## 2022-10-19 DIAGNOSIS — Z6839 Body mass index (BMI) 39.0-39.9, adult: Secondary | ICD-10-CM | POA: Diagnosis not present

## 2022-10-19 DIAGNOSIS — M7542 Impingement syndrome of left shoulder: Secondary | ICD-10-CM

## 2022-10-19 DIAGNOSIS — F330 Major depressive disorder, recurrent, mild: Secondary | ICD-10-CM | POA: Diagnosis not present

## 2022-10-19 DIAGNOSIS — Z79899 Other long term (current) drug therapy: Secondary | ICD-10-CM | POA: Diagnosis not present

## 2022-10-19 DIAGNOSIS — I480 Paroxysmal atrial fibrillation: Secondary | ICD-10-CM

## 2022-10-19 NOTE — Progress Notes (Signed)
Subjective:  Patient ID: Monique Kelly, female    DOB: 06-Aug-1955  Age: 67 y.o. MRN: [id number]  Chief Complaint  Patient presents with   Hospitalization Follow-up    HPI   Patient presents today for hospital follow up. Admitted 10/04/22 and dc 10/05/2022.  Patient was there for a colonoscopy and was found to be in A-fib with RVR.  Treated for a-fib with RVR with oral medications and then eventually was given IV Cardizem.  She did convert to sinus tachycardia however she went back into atrial fibrillation while in the hospital. Started on diltiazem 120 mg daily and Eliquis 5 mg TWICE DAILY.  Patient is scheduled for  cardiology follow-up in June.  Patient has metoprolol which previously she was recommend to take as needed palpitations however she reached out to cardiology who told her not to do this since she was recently started on diltiazem during her admission.  Patient is concerned about the number of medications on her list.  She would like to decrease it.  The patient is not taking her duloxetine for fibromyalgia.  She cannot tell a difference without it.  She is unsure if the Wellbutrin is helping.  I reviewed the rest of her medication list in both for the majority these medicines she does need to be on.  She is also speaking with prevo drug about doing a pill pack.  Patient is not taking her omeprazole every day even though Dr. Jennye Boroughs instructed her to mainly because she just has so many medicines.  Patient had a fundoplication and frequently has acid reflux.  In addition patient is complaining of her left shoulder pain.  Has had an x-ray and I had ordered an MRI however the patient did not get that yet.  She would like to go ahead and get this.  She is scheduled to get stimulator in her back this summer and she complains of constant chronic back pain.       10/19/2022    2:44 PM 08/11/2022    9:51 AM 06/23/2022    1:51 PM 03/24/2022    2:13 PM 08/04/2021   11:49 AM  Depression  screen PHQ 2/9  Decreased Interest 1 0 0 1 0  Down, Depressed, Hopeless 1 0 0 1 0  PHQ - 2 Score 2 0 0 2 0  Altered sleeping 3  3 3    Tired, decreased energy 3  3 3    Change in appetite 1  0 0   Feeling bad or failure about yourself  0  0 0   Trouble concentrating 2  1 1    Moving slowly or fidgety/restless 0  0 0   Suicidal thoughts 0  0 0   PHQ-9 Score 11  7 9    Difficult doing work/chores Not difficult at all  Not difficult at all Somewhat difficult         10/19/2022    2:44 PM  Fall Risk   Falls in the past year? 1  Number falls in past yr: 1  Injury with Fall? 0  Risk for fall due to : No Fall Risks  Follow up Falls evaluation completed    Patient Care Team: Blane Ohara, MD as PCP - General (Family Medicine) Hillis Range, MD (Inactive) as PCP - Electrophysiology (Cardiology) Hillis Range, MD (Inactive) as Consulting Physician (Cardiology) Dellia Beckwith, MD as Consulting Physician (Oncology) Darleene Cleaver, MD as Consulting Physician (General Surgery) Misenheimer, Marcial Pacas, MD as Consulting Physician (Unknown Physician Specialty)  Althea Grimmer, DDS as Referring Physician (Orthodontics) Georgiana Shore, Veverly Fells., MD as Referring Physician (Surgery) Regan Lemming, MD as Consulting Physician (Cardiology)   Review of Systems  Constitutional:  Negative for chills, fatigue and fever.  HENT:  Negative for congestion, ear pain, rhinorrhea and sore throat.   Respiratory:  Negative for cough and shortness of breath.   Cardiovascular:  Negative for chest pain.  Gastrointestinal:  Negative for abdominal pain, constipation, diarrhea, nausea and vomiting.  Genitourinary:  Negative for dysuria and urgency.  Musculoskeletal:  Positive for back pain. Negative for myalgias.  Neurological:  Positive for headaches. Negative for dizziness, weakness and light-headedness.  Psychiatric/Behavioral:  Negative for dysphoric mood. The patient is not nervous/anxious.     Current  Outpatient Medications on File Prior to Visit  Medication Sig Dispense Refill   diltiazem (CARDIZEM CD) 120 MG 24 hr capsule Take 120 mg by mouth daily.     ELIQUIS 5 MG TABS tablet Take 5 mg by mouth 2 (two) times daily.     buPROPion (WELLBUTRIN XL) 150 MG 24 hr tablet Take 3 tablets (450 mg total) by mouth daily. 270 tablet 1   calcium carbonate (OS-CAL - DOSED IN MG OF ELEMENTAL CALCIUM) 1250 (500 Ca) MG tablet Take 1 tablet by mouth daily with breakfast.     cyanocobalamin 1000 MCG tablet Take by mouth.     ergocalciferol (VITAMIN D2) 1.25 MG (50000 UT) capsule Take by mouth.     Evolocumab (REPATHA SURECLICK) 140 MG/ML SOAJ Inject 140 mg into the skin every 14 (fourteen) days. 2 mL 12   furosemide (LASIX) 20 MG tablet Take 1 tablet (20 mg total) by mouth daily as needed for edema. 30 tablet 0   gabapentin (NEURONTIN) 600 MG tablet Take 1 tablet (600 mg total) by mouth 3 (three) times daily. 270 tablet 1   letrozole (FEMARA) 2.5 MG tablet TAKE ONE TABLET BY MOUTH EVERY DAY 90 tablet 3   levothyroxine (SYNTHROID) 75 MCG tablet Take 75 mcg by mouth daily.      metoprolol tartrate (LOPRESSOR) 25 MG tablet Take 1 tablet (25 mg total) by mouth daily as needed (PALPITATION/ AFIB). 90 tablet 0   omeprazole (PRILOSEC) 40 MG capsule Take 1 capsule (40 mg total) by mouth in the morning and at bedtime. 180 capsule 3   Zoledronic Acid (RECLAST IV) Inject into the vein.     No current facility-administered medications on file prior to visit.   Past Medical History:  Diagnosis Date   A-fib (HCC) 09/08/2015   Atrial fibrillation (HCC) 2016   Sees Dr. Hillis Range. Cardiac ablation, loop recorder (removed in 2022)   Atrial fibrillation with rapid ventricular response (HCC) 04/2015   Breast cancer (HCC)    right lumpectomy   BRONCHITIS 03/10/2009   Qualifier: Diagnosis of  By: Alto Denver MD, Grayland Ormond    Chest pain with moderate risk for cardiac etiology 04/28/2015   Dehydration 06/18/2009   Qualifier:  Diagnosis of  By: Mike Gip     Depression    Family history of breast cancer    Family history of colon cancer    Fibromyalgia 2010   Hyperlipidemia    Hypothyroidism (acquired) 2006   s/p thyroidectomy for nodules   Morbid obesity (HCC) 08/10/2015   Obesity    gastric bpg 2011, weight then 400 lbs, lost 100 lbs in 60 days.   Obstructive sleep apnea    does not use cpap, does not have a following physician  Paroxysmal atrial fibrillation (HCC) 08/10/2015   Past Surgical History:  Procedure Laterality Date   ABDOMINAL HYSTERECTOMY  1992   total.    APPENDECTOMY     BREAST BIOPSY Left 08/04/2022   MM LT BREAST BX W LOC DEV 1ST LESION IMAGE BX SPEC STEREO GUIDE 08/04/2022 [facility]   BREAST BIOPSY Left 08/12/2022   MM LT BREAST BX W LOC DEV 1ST LESION IMAGE BX SPEC STEREO GUIDE 08/12/2022 [facility]   BREAST SURGERY  2019   right lumpectomy    CHOLECYSTECTOMY     COLONOSCOPY     ELECTROPHYSIOLOGIC STUDY N/A 09/08/2015   Procedure: Atrial Fibrillation Ablation;  Surgeon: Hillis Range, MD;  Location[facility] INVASIVE CV LAB;  Service: Cardiovascular;  Laterality: N/A;   EP IMPLANTABLE DEVICE N/A 12/17/2015   Procedure: Loop Recorder Insertion;  Surgeon: Hillis Range, MD;  Location: [facility] INVASIVE CV LAB;  Service: Cardiovascular;  Laterality: N/A;   ESOPHAGOGASTRODUODENOSCOPY     EXPLORATORY LAPAROTOMY WITH ABDOMINAL MASS EXCISION  2018   GASTRIC BYPASS  2011   lost 100 lbs, but gained some back.   implantable loop recorder removal  03/25/2020   removed 2022   NECK SURGERY  2005   laminectomy?   THYROIDECTOMY  2006   for nodules ( hot and cold nodules) Goiter compressing trachea.    Family History  Problem Relation Age of Onset   Heart failure Father    Cancer Father        colon   Heart failure Maternal Grandmother    Heart failure Maternal Grandfather    Cancer Mother        breast   Social History   Socioeconomic History   Marital status: Single     Spouse name: Not on file   Number of children: Not on file   Years of education: Not on file   Highest education level: Bachelor's degree (e.g., BA, AB, BS)  Occupational History   Occupation: Economist  Tobacco Use   Smoking status: Never   Smokeless tobacco: Never  Vaping Use   Vaping Use: Never used  Substance and Sexual Activity   Alcohol use: Yes    Alcohol/week: 1.0 standard drink of alcohol    Types: 1 Standard drinks or equivalent per week    Comment: rare glass of wine   Drug use: No   Sexual activity: Not Currently  Other Topics Concern   Not on file  Social History Narrative   Pt lives in [place] alone. 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   Social Determinants of Health   Financial Resource Strain: High Risk (09/15/2022)   Overall Financial Resource Strain (CARDIA)    Difficulty of Paying Living Expenses: Hard  Food Insecurity: No Food Insecurity (09/15/2022)   Hunger Vital Sign    Worried About Running Out of Food in the Last Year: Never true    Ran Out of Food in the Last Year: Never true  Transportation Needs: No Transportation Needs (09/15/2022)   PRAPARE - Administrator, Civil Service (Medical): No    Lack of Transportation (Non-Medical): No  Physical Activity: Inactive (09/15/2022)   Exercise Vital Sign    Days of Exercise per Week: 0 days    Minutes of Exercise per Session: 0 min  Stress: Stress Concern Present (09/15/2022)   [employer] of Occupational Health - Occupational Stress Questionnaire    Feeling of Stress : To some extent  Social Connections:  Unknown (09/15/2022)   Social Connection and Isolation Panel [NHANES]    Frequency of Communication with Friends and Family: More than three times a week    Frequency of Social Gatherings with Friends and Family: Three times a week    Attends Religious Services: Patient declined    Active Member of Clubs or Organizations: No    Attends Tax inspector Meetings: Never    Marital Status: Never married    Objective:  BP 112/78   Pulse 86   Temp (!) 97.2 F (36.2 C)   Ht 5\' 7"  (1.702 m)   Wt 250 lb (113.4 kg)   SpO2 93%   BMI 39.16 kg/m      10/19/2022    2:35 PM 09/19/2022    3:01 PM 08/11/2022    9:48 AM  BP/Weight  Systolic BP 112 110 134  Diastolic BP 78 70 78  Wt. (Lbs) 250 250 248  BMI 39.16 kg/m2 39.16 kg/m2 38.84 kg/m2    Physical Exam Vitals reviewed.  Constitutional:      Appearance: Normal appearance. She is normal weight.  Neck:     Vascular: No carotid bruit.  Cardiovascular:     Rate and Rhythm: Normal rate and regular rhythm.     Heart sounds: Normal heart sounds.  Pulmonary:     Effort: Pulmonary effort is normal. No respiratory distress.     Breath sounds: Normal breath sounds.  Abdominal:     General: Abdomen is flat. Bowel sounds are normal.     Palpations: Abdomen is soft.     Tenderness: There is no abdominal tenderness.  Musculoskeletal:        General: Tenderness (left lateral upper arm. nontender over left shoulder. Poor ROM due to pain. poor abduction, external and internal rotation.) present.  Neurological:     Mental Status: She is alert and oriented to person, place, and time.  Psychiatric:        Mood and Affect: Mood normal.        Behavior: Behavior normal.     Diabetic Foot Exam - Simple   No data filed      Lab Results  Component Value Date   WBC 7.0 08/11/2022   HGB 10.6 (L) 08/11/2022   HCT 34.9 08/11/2022   PLT 277 08/11/2022   GLUCOSE 85 08/11/2022   CHOL 120 05/10/2022   TRIG 88 05/10/2022   HDL 57 05/10/2022   LDLCALC 46 05/10/2022   ALT 12 08/11/2022   AST 12 08/11/2022   NA 145 (H) 08/11/2022   K 4.5 08/11/2022   CL 107 (H) 08/11/2022   CREATININE 0.92 08/11/2022   BUN 11 08/11/2022   CO2 24 08/11/2022   TSH 1.380 08/11/2022      Assessment & Plan:    Rotator cuff impingement syndrome of left shoulder Assessment & Plan: Order mri of  shoulder.   Orders: -     MR SHOULDER LEFT WO CONTRAST; Future  Paroxysmal atrial fibrillation (HCC) Assessment & Plan: Rate controlled. The current medical regimen is effective;  continue present plan and medications.   Orders: -     EKG 12-Lead  Polypharmacy Assessment & Plan: Stop duloxetine.  Decrease wellbutrin xl to 150 mg 2 in am.  All other medicines are necessary.   Depression, major, recurrent, mild (HCC) Assessment & Plan: Stop duloxetine.  Decrease wellbutrin xl to 150 mg 2 in am.    Class 2 severe obesity due to excess calories with serious comorbidity  and body mass index (BMI) of 39.0 to 39.9 in adult [facility]) Assessment & Plan: Comorbidities: depression. Hypothyroidism.  Recommend continue to work on eating healthy diet. Unable to exercise.       No orders of the defined types were placed in this encounter.   Orders Placed This Encounter  Procedures   MR Shoulder Left Wo Contrast   EKG 12-Lead     Follow-up: Return in about 3 months (around 01/19/2023) for chronic fasting.   I,Katherina A Bramblett,acting as a scribe for Blane Ohara, MD.,have documented all relevant documentation on the behalf of Blane Ohara, MD,as directed by  Blane Ohara, MD while in the presence of Blane Ohara, MD.   An After Visit Summary was printed and given to the patient.  Blane Ohara, MD [facility] [phone]

## 2022-10-19 NOTE — Patient Instructions (Addendum)
Stop duloxetine.  Decrease wellbutrin xl to 150 mg 2 in am.

## 2022-10-21 ENCOUNTER — Encounter: Payer: Self-pay | Admitting: Family Medicine

## 2022-10-21 DIAGNOSIS — Z79899 Other long term (current) drug therapy: Secondary | ICD-10-CM | POA: Insufficient documentation

## 2022-10-21 NOTE — Assessment & Plan Note (Signed)
Stop duloxetine.  Decrease wellbutrin xl to 150 mg 2 in am.  

## 2022-10-21 NOTE — Assessment & Plan Note (Signed)
Order mri of shoulder.

## 2022-10-21 NOTE — Assessment & Plan Note (Signed)
Comorbidities: depression. Hypothyroidism.  Recommend continue to work on eating healthy diet. Unable to exercise.

## 2022-10-21 NOTE — Assessment & Plan Note (Signed)
Rate controlled. The current medical regimen is effective;  continue present plan and medications.

## 2022-10-21 NOTE — Assessment & Plan Note (Signed)
Stop duloxetine.  Decrease wellbutrin xl to 150 mg 2 in am.  All other medicines are necessary.

## 2022-10-25 DIAGNOSIS — M7542 Impingement syndrome of left shoulder: Secondary | ICD-10-CM | POA: Diagnosis not present

## 2022-10-25 DIAGNOSIS — M19012 Primary osteoarthritis, left shoulder: Secondary | ICD-10-CM | POA: Diagnosis not present

## 2022-10-27 ENCOUNTER — Telehealth: Payer: Self-pay

## 2022-10-27 NOTE — Telephone Encounter (Signed)
Patient called stating that she will be having a spinal implant done at the Pain clinic at the end of June. She is needing [place] to call the nurse navigator to let her know when the patient will need to stop her eliquis before the implant and then when she would need to restart it after the surgery. She has given the phone number and name of the Nurse.  Daniellea -Nurse Navigator- Phone# [phone]   If you will give the recommendations I can call the nurse to inform her. Please advise.

## 2022-10-27 NOTE — Telephone Encounter (Signed)
Patient should hold eliquis 48 hours prior to procedure and restart 24 hours after procedure, unless physician performing procedure feels she needs to wait longer prior to restarting due to complications, such as bleeding Dr. Sedalia Muta

## 2022-10-28 NOTE — Telephone Encounter (Signed)
Left message for Daniella nurse navigator to give [place] a call back.

## 2022-10-31 ENCOUNTER — Telehealth: Payer: Self-pay | Admitting: *Deleted

## 2022-10-31 ENCOUNTER — Encounter: Payer: Self-pay | Admitting: Family Medicine

## 2022-10-31 NOTE — Telephone Encounter (Signed)
Nurse navigator called back and left a message the they would have the patient hold the eliquis for 3 days and restart 24 hours after procedure completed.

## 2022-10-31 NOTE — Patient Outreach (Signed)
  Care Coordination   10/31/2022 Name: Monique Kelly MRN: [id number] DOB: 1955/08/23   Care Coordination Outreach Attempts:  An unsuccessful telephone outreach was attempted today to offer the patient information about available care coordination services.  Follow Up Plan:  Additional outreach attempts will be made to offer the patient care coordination information and services.   Encounter Outcome:  No Answer   Care Coordination Interventions:  No, not indicated   Reece Levy, MSW, LCSW Clinical Social Worker [facility] [phone]

## 2022-11-02 ENCOUNTER — Encounter: Payer: Self-pay | Admitting: Family Medicine

## 2022-11-03 ENCOUNTER — Other Ambulatory Visit: Payer: Self-pay

## 2022-11-03 DIAGNOSIS — M7542 Impingement syndrome of left shoulder: Secondary | ICD-10-CM

## 2022-11-04 NOTE — Telephone Encounter (Signed)
I reviewed this MRI and had nurses call pt - recommended she be referred to ortho

## 2022-11-07 ENCOUNTER — Other Ambulatory Visit: Payer: Self-pay | Admitting: Family Medicine

## 2022-11-08 DIAGNOSIS — I4891 Unspecified atrial fibrillation: Secondary | ICD-10-CM | POA: Diagnosis not present

## 2022-11-08 DIAGNOSIS — Z01818 Encounter for other preprocedural examination: Secondary | ICD-10-CM | POA: Diagnosis not present

## 2022-11-08 DIAGNOSIS — R600 Localized edema: Secondary | ICD-10-CM | POA: Diagnosis not present

## 2022-11-08 DIAGNOSIS — E039 Hypothyroidism, unspecified: Secondary | ICD-10-CM | POA: Diagnosis not present

## 2022-11-08 DIAGNOSIS — Z7901 Long term (current) use of anticoagulants: Secondary | ICD-10-CM | POA: Diagnosis not present

## 2022-11-08 DIAGNOSIS — D649 Anemia, unspecified: Secondary | ICD-10-CM | POA: Diagnosis not present

## 2022-11-08 DIAGNOSIS — E785 Hyperlipidemia, unspecified: Secondary | ICD-10-CM | POA: Diagnosis not present

## 2022-11-08 DIAGNOSIS — G4733 Obstructive sleep apnea (adult) (pediatric): Secondary | ICD-10-CM | POA: Diagnosis not present

## 2022-11-08 DIAGNOSIS — U099 Post covid-19 condition, unspecified: Secondary | ICD-10-CM | POA: Diagnosis not present

## 2022-11-08 DIAGNOSIS — K219 Gastro-esophageal reflux disease without esophagitis: Secondary | ICD-10-CM | POA: Diagnosis not present

## 2022-11-09 ENCOUNTER — Telehealth: Payer: Self-pay | Admitting: *Deleted

## 2022-11-09 NOTE — Patient Outreach (Signed)
  Care Coordination   11/09/2022 Name: Monique Kelly MRN: [id number] DOB: November 30, 1955   Care Coordination Outreach Attempts:  A second unsuccessful outreach was attempted today to offer the patient with information about available care coordination services.  Follow Up Plan:  Additional outreach attempts will be made to offer the patient care coordination information and services.   Encounter Outcome:  No Answer   Care Coordination Interventions:  No, not indicated    Reece Levy, MSW, LCSW Clinical Social Worker [facility] [phone]

## 2022-11-10 ENCOUNTER — Other Ambulatory Visit: Payer: Self-pay | Admitting: Family Medicine

## 2022-11-10 ENCOUNTER — Telehealth: Payer: Self-pay

## 2022-11-10 DIAGNOSIS — I700 Atherosclerosis of aorta: Secondary | ICD-10-CM

## 2022-11-10 NOTE — Telephone Encounter (Signed)
Paper has been received today from the [facility] and is waiting for signature to be faxed back.

## 2022-11-10 NOTE — Telephone Encounter (Signed)
Patient is called requesting a referral to [facility]. She states she has been in contact with them and can't schedule until she has a referral.  She also mentioned that she had an issue with her last two week pen of repatha and had to call the company and they are sending her a replacement pen through [facility] so she stated they would be sending a fax that we needed to fill out and send back to them so she can get her replacement pen.

## 2022-11-10 NOTE — Telephone Encounter (Signed)
Done. Please be watching for prescription request from [facility]  Dr. Sedalia Muta

## 2022-11-14 DIAGNOSIS — G8929 Other chronic pain: Secondary | ICD-10-CM | POA: Diagnosis not present

## 2022-11-14 DIAGNOSIS — Z7901 Long term (current) use of anticoagulants: Secondary | ICD-10-CM | POA: Diagnosis not present

## 2022-11-14 DIAGNOSIS — E039 Hypothyroidism, unspecified: Secondary | ICD-10-CM | POA: Diagnosis not present

## 2022-11-14 DIAGNOSIS — I4891 Unspecified atrial fibrillation: Secondary | ICD-10-CM | POA: Diagnosis not present

## 2022-11-14 DIAGNOSIS — Z4542 Encounter for adjustment and management of neuropacemaker (brain) (peripheral nerve) (spinal cord): Secondary | ICD-10-CM | POA: Diagnosis not present

## 2022-11-14 DIAGNOSIS — Z79899 Other long term (current) drug therapy: Secondary | ICD-10-CM | POA: Diagnosis not present

## 2022-11-14 DIAGNOSIS — F32A Depression, unspecified: Secondary | ICD-10-CM | POA: Diagnosis not present

## 2022-11-14 DIAGNOSIS — G894 Chronic pain syndrome: Secondary | ICD-10-CM | POA: Diagnosis not present

## 2022-11-14 DIAGNOSIS — R609 Edema, unspecified: Secondary | ICD-10-CM | POA: Diagnosis not present

## 2022-11-14 DIAGNOSIS — E785 Hyperlipidemia, unspecified: Secondary | ICD-10-CM | POA: Diagnosis not present

## 2022-11-14 DIAGNOSIS — D649 Anemia, unspecified: Secondary | ICD-10-CM | POA: Diagnosis not present

## 2022-11-14 DIAGNOSIS — G4733 Obstructive sleep apnea (adult) (pediatric): Secondary | ICD-10-CM | POA: Diagnosis not present

## 2022-11-14 DIAGNOSIS — I739 Peripheral vascular disease, unspecified: Secondary | ICD-10-CM | POA: Diagnosis not present

## 2022-11-14 DIAGNOSIS — M5416 Radiculopathy, lumbar region: Secondary | ICD-10-CM | POA: Diagnosis not present

## 2022-11-15 ENCOUNTER — Telehealth: Payer: Self-pay | Admitting: *Deleted

## 2022-11-15 ENCOUNTER — Other Ambulatory Visit: Payer: Self-pay

## 2022-11-15 ENCOUNTER — Other Ambulatory Visit: Payer: Self-pay | Admitting: Family Medicine

## 2022-11-15 ENCOUNTER — Telehealth: Payer: Self-pay | Admitting: Student

## 2022-11-15 DIAGNOSIS — R600 Localized edema: Secondary | ICD-10-CM

## 2022-11-15 NOTE — Telephone Encounter (Signed)
  Patient sent a message through pt schedule request on mychart;  Comments: A Fib. This was to be a follow-up to my hospitalization for A Fib. On Monday June 24th I had a medical procedure to implant a Best boy with Rolene Course MD at [facility]. I was prescribed Elequis and Dililitizam for AFib while in hospital. I am out of these medications and don't know who to request to renew the medications.   The reason for this email is to request if I could turn the Friday the 28th visit into an e visit .  Please advise

## 2022-11-15 NOTE — Patient Outreach (Addendum)
  Care Coordination   11/15/2022 Name: Monique Kelly MRN: [id number] DOB: [birth year]   Care Coordination Outreach Attempts:  A third unsuccessful outreach was attempted today to offer the patient with information about available care coordination services.  Follow Up Plan:  Additional outreach attempts will be made to offer the patient care coordination information and services.   Encounter Outcome:  No Answer   Care Coordination Interventions:  No, not indicated    Reece Levy, MSW, LCSW Clinical Social Worker [facility] [phone]

## 2022-11-16 ENCOUNTER — Telehealth: Payer: Self-pay

## 2022-11-16 NOTE — Telephone Encounter (Signed)
Patient was hospitalized 10/05/22 and was started on Eliquis and Diltiazem.  She was seen in our office 5/29.    Patient calling today to see if she should continue the Eliquis and Diltiazem.  She is out of Eliquis.  Please advise.

## 2022-11-16 NOTE — Telephone Encounter (Signed)
Patient should be seeing cardiology. They should really be prescribing these since it was new atrial fibrillation. She should not stop them. I will send them if she cannot reach anyone. Dr. Sedalia Muta

## 2022-11-17 MED ORDER — ELIQUIS 5 MG PO TABS: 5.0000 mg | ORAL_TABLET | Freq: Two times a day (BID) | ORAL | 2 refills | Status: DC

## 2022-11-17 NOTE — Telephone Encounter (Signed)
Patient notified that cardiology should really be prescribing these since it was new atrial fibrillation. She should not stop them. Patient stated that she still has 5 pills left of the Diltiazem. Patient stated that she has an appointment with the cardiologist for this Friday but does not think she will physically able to come in, she has messaged them to see about getting the appointment changed to a e-visit. Patient notified that lasix and eliquis has been sent into the pharmacy.

## 2022-11-18 ENCOUNTER — Ambulatory Visit: Payer: PPO | Admitting: Student

## 2022-11-23 DIAGNOSIS — T148XXA Other injury of unspecified body region, initial encounter: Secondary | ICD-10-CM | POA: Diagnosis not present

## 2022-11-23 DIAGNOSIS — M5416 Radiculopathy, lumbar region: Secondary | ICD-10-CM | POA: Diagnosis not present

## 2022-11-28 DIAGNOSIS — M19012 Primary osteoarthritis, left shoulder: Secondary | ICD-10-CM | POA: Diagnosis not present

## 2022-12-03 ENCOUNTER — Other Ambulatory Visit: Payer: Self-pay | Admitting: Family Medicine

## 2022-12-05 ENCOUNTER — Other Ambulatory Visit: Payer: Self-pay

## 2022-12-05 ENCOUNTER — Telehealth: Payer: Self-pay

## 2022-12-05 MED ORDER — LEVOTHYROXINE SODIUM 75 MCG PO TABS: 75.0000 ug | ORAL_TABLET | Freq: Every day | ORAL | 0 refills | Status: DC

## 2022-12-05 NOTE — Telephone Encounter (Signed)
Prescription Request  12/05/2022  LOV: Visit date not found  What is the name of the medication or equipment?  levothyroxine (SYNTHROID) 75 MCG tablet  FYI: I have her scheduled for a chronic f.up in September.   Have you contacted your pharmacy to request a refill? No   Which pharmacy would you like this sent to?   [facility] - [place], [place] - [address] [place] [address] Phone: [phone] Fax: [phone]     Patient notified that their request is being sent to the clinical staff for review and that they should receive a response within 2 business days.   Please advise at [facility] [phone]

## 2022-12-06 DIAGNOSIS — M5416 Radiculopathy, lumbar region: Secondary | ICD-10-CM | POA: Diagnosis not present

## 2022-12-21 ENCOUNTER — Other Ambulatory Visit: Payer: Self-pay | Admitting: Family Medicine

## 2022-12-21 DIAGNOSIS — R600 Localized edema: Secondary | ICD-10-CM

## 2022-12-29 ENCOUNTER — Ambulatory Visit: Payer: PPO | Admitting: Student

## 2023-01-05 ENCOUNTER — Inpatient Hospital Stay: Payer: PPO

## 2023-01-05 ENCOUNTER — Inpatient Hospital Stay: Payer: PPO | Admitting: Oncology

## 2023-01-10 ENCOUNTER — Other Ambulatory Visit: Payer: Self-pay | Admitting: Family Medicine

## 2023-01-10 ENCOUNTER — Encounter: Payer: Self-pay | Admitting: Family Medicine

## 2023-01-10 MED ORDER — TRAMADOL HCL 50 MG PO TABS: 50.0000 mg | ORAL_TABLET | Freq: Four times a day (QID) | ORAL | 0 refills | Status: AC | PRN

## 2023-01-17 ENCOUNTER — Other Ambulatory Visit: Payer: Self-pay

## 2023-01-17 DIAGNOSIS — F330 Major depressive disorder, recurrent, mild: Secondary | ICD-10-CM

## 2023-01-17 MED ORDER — BUPROPION HCL ER (XL) 150 MG PO TB24: 450.0000 mg | ORAL_TABLET | Freq: Every day | ORAL | 1 refills | Status: DC

## 2023-01-18 ENCOUNTER — Ambulatory Visit: Payer: PPO | Admitting: Dietician

## 2023-01-19 ENCOUNTER — Inpatient Hospital Stay: Payer: PPO | Attending: Hematology and Oncology

## 2023-01-19 ENCOUNTER — Inpatient Hospital Stay: Payer: PPO

## 2023-01-19 ENCOUNTER — Inpatient Hospital Stay (INDEPENDENT_AMBULATORY_CARE_PROVIDER_SITE_OTHER): Payer: PPO | Admitting: Hematology and Oncology

## 2023-01-19 ENCOUNTER — Encounter: Payer: Self-pay | Admitting: Hematology and Oncology

## 2023-01-19 VITALS — BP 98/61 | HR 82 | Temp 98.2°F | Resp 20 | Ht 67.0 in | Wt 244.5 lb

## 2023-01-19 DIAGNOSIS — Z803 Family history of malignant neoplasm of breast: Secondary | ICD-10-CM | POA: Insufficient documentation

## 2023-01-19 DIAGNOSIS — Z853 Personal history of malignant neoplasm of breast: Secondary | ICD-10-CM | POA: Insufficient documentation

## 2023-01-19 DIAGNOSIS — M797 Fibromyalgia: Secondary | ICD-10-CM | POA: Diagnosis not present

## 2023-01-19 DIAGNOSIS — C50411 Malignant neoplasm of upper-outer quadrant of right female breast: Secondary | ICD-10-CM | POA: Diagnosis not present

## 2023-01-19 DIAGNOSIS — Z79899 Other long term (current) drug therapy: Secondary | ICD-10-CM | POA: Diagnosis not present

## 2023-01-19 DIAGNOSIS — Z9884 Bariatric surgery status: Secondary | ICD-10-CM | POA: Insufficient documentation

## 2023-01-19 DIAGNOSIS — Z170 Estrogen receptor positive status [ER+]: Secondary | ICD-10-CM | POA: Diagnosis not present

## 2023-01-19 DIAGNOSIS — Z9049 Acquired absence of other specified parts of digestive tract: Secondary | ICD-10-CM | POA: Insufficient documentation

## 2023-01-19 DIAGNOSIS — Z800 Family history of malignant neoplasm of digestive organs: Secondary | ICD-10-CM | POA: Diagnosis not present

## 2023-01-19 DIAGNOSIS — Z79890 Hormone replacement therapy (postmenopausal): Secondary | ICD-10-CM | POA: Insufficient documentation

## 2023-01-19 DIAGNOSIS — M2550 Pain in unspecified joint: Secondary | ICD-10-CM | POA: Diagnosis not present

## 2023-01-19 DIAGNOSIS — Z923 Personal history of irradiation: Secondary | ICD-10-CM | POA: Diagnosis not present

## 2023-01-19 DIAGNOSIS — E890 Postprocedural hypothyroidism: Secondary | ICD-10-CM | POA: Diagnosis not present

## 2023-01-19 DIAGNOSIS — M549 Dorsalgia, unspecified: Secondary | ICD-10-CM | POA: Insufficient documentation

## 2023-01-19 DIAGNOSIS — R5383 Other fatigue: Secondary | ICD-10-CM | POA: Diagnosis not present

## 2023-01-19 DIAGNOSIS — Z79811 Long term (current) use of aromatase inhibitors: Secondary | ICD-10-CM | POA: Diagnosis not present

## 2023-01-19 DIAGNOSIS — M8580 Other specified disorders of bone density and structure, unspecified site: Secondary | ICD-10-CM

## 2023-01-19 DIAGNOSIS — Z780 Asymptomatic menopausal state: Secondary | ICD-10-CM | POA: Diagnosis not present

## 2023-01-19 DIAGNOSIS — D509 Iron deficiency anemia, unspecified: Secondary | ICD-10-CM | POA: Insufficient documentation

## 2023-01-19 DIAGNOSIS — M85851 Other specified disorders of bone density and structure, right thigh: Secondary | ICD-10-CM | POA: Diagnosis not present

## 2023-01-19 DIAGNOSIS — I480 Paroxysmal atrial fibrillation: Secondary | ICD-10-CM | POA: Insufficient documentation

## 2023-01-19 DIAGNOSIS — Z7901 Long term (current) use of anticoagulants: Secondary | ICD-10-CM | POA: Diagnosis not present

## 2023-01-19 DIAGNOSIS — Z90710 Acquired absence of both cervix and uterus: Secondary | ICD-10-CM | POA: Diagnosis not present

## 2023-01-19 DIAGNOSIS — G479 Sleep disorder, unspecified: Secondary | ICD-10-CM | POA: Diagnosis not present

## 2023-01-19 DIAGNOSIS — E785 Hyperlipidemia, unspecified: Secondary | ICD-10-CM | POA: Diagnosis not present

## 2023-01-19 DIAGNOSIS — Z8249 Family history of ischemic heart disease and other diseases of the circulatory system: Secondary | ICD-10-CM | POA: Insufficient documentation

## 2023-01-19 DIAGNOSIS — D500 Iron deficiency anemia secondary to blood loss (chronic): Secondary | ICD-10-CM

## 2023-01-19 DIAGNOSIS — F32A Depression, unspecified: Secondary | ICD-10-CM | POA: Diagnosis not present

## 2023-01-19 LAB — CMP (CANCER CENTER ONLY)
ALT: 13 U/L (ref 0–44)
AST: 15 U/L (ref 15–41)
Albumin: 3 g/dL — ABNORMAL LOW (ref 3.5–5.0)
Alkaline Phosphatase: 80 U/L (ref 38–126)
Anion gap: 10 (ref 5–15)
BUN: 13 mg/dL (ref 8–23)
CO2: 30 mmol/L (ref 22–32)
Calcium: 8.5 mg/dL — ABNORMAL LOW (ref 8.9–10.3)
Chloride: 103 mmol/L (ref 98–111)
Creatinine: 0.95 mg/dL (ref 0.44–1.00)
GFR, Estimated: >60 mL/min (ref >60)
Glucose, Bld: 101 mg/dL — ABNORMAL HIGH (ref 70–99)
Potassium: 3.3 mmol/L — ABNORMAL LOW (ref 3.5–5.1)
Sodium: 143 mmol/L (ref 135–145)
Total Bilirubin: 0.5 mg/dL (ref 0.3–1.2)
Total Protein: 6.1 g/dL — ABNORMAL LOW (ref 6.5–8.1)

## 2023-01-19 LAB — FERRITIN: Ferritin: 18 ng/mL (ref 11–307)

## 2023-01-19 LAB — CBC WITH DIFFERENTIAL (CANCER CENTER ONLY)
Abs Immature Granulocytes: 0.02 10*3/uL (ref 0.00–0.07)
Basophils Absolute: 0 10*3/uL (ref 0.0–0.1)
Basophils Relative: 1 %
Eosinophils Absolute: 0.1 10*3/uL (ref 0.0–0.5)
Eosinophils Relative: 1 %
HCT: 38.7 % (ref 36.0–46.0)
Hemoglobin: 11.8 g/dL — ABNORMAL LOW (ref 12.0–15.0)
Immature Granulocytes: 0 %
Lymphocytes Relative: 27 %
Lymphs Abs: 1.5 10*3/uL (ref 0.7–4.0)
MCH: 30.2 pg (ref 26.0–34.0)
MCHC: 30.5 g/dL (ref 30.0–36.0)
MCV: 99 fL (ref 80.0–100.0)
Monocytes Absolute: 0.4 10*3/uL (ref 0.1–1.0)
Monocytes Relative: 7 %
Neutro Abs: 3.5 10*3/uL (ref 1.7–7.7)
Neutrophils Relative %: 64 %
Platelet Count: 303 10*3/uL (ref 150–400)
RBC: 3.91 MIL/uL (ref 3.87–5.11)
RDW: 16.7 % — ABNORMAL HIGH (ref 11.5–15.5)
WBC Count: 5.6 10*3/uL (ref 4.0–10.5)
nRBC: 0 % (ref 0.0–0.2)

## 2023-01-19 LAB — IRON AND TIBC
Iron: 57 ug/dL (ref 28–170)
Saturation Ratios: 19 % (ref 10.4–31.8)
TIBC: 305 ug/dL (ref 250–450)
UIBC: 248 ug/dL

## 2023-01-19 MED ORDER — HEPARIN SOD (PORK) LOCK FLUSH 100 UNIT/ML IV SOLN
500.0000 [IU] | Freq: Once | INTRAVENOUS | Status: AC | PRN
  Administered 2023-01-19: 500 [IU]

## 2023-01-19 MED ORDER — SODIUM CHLORIDE 0.9% FLUSH
10.0000 mL | INTRAVENOUS | Status: DC | PRN
  Administered 2023-01-19: 10 mL

## 2023-01-19 NOTE — Progress Notes (Signed)
[facility]  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  She was treated with lumpectomy.  Due to a high Endo predict score, she received adjuvant chemotherapy with docetaxel/frusemide for 4 cycles completed in May 2019.  She received and adjuvant radiation therapy to the left breast completed in July 2019.  She was placed on adjuvant hormonal therapy with letrozole 2.5 mg daily in August 2019, so completes 5 years this month.  Left breast biopsy in March did not reveal any evidence of malignancy.  She remains without evidence of recurrent disease.  We will plan to see her back in 6 months with bilateral screening mammogram.       Osteopenia after menopause Osteopenia, for which she is on yearly Reclast.  Bone density scan in January 2024 revealed improvement with a T-score of -2.2 in the right femur neck, previously -2.3 and a T-score of -1.1 in the total femur, previously -2.0. T-score in the left forearm was -2.9 on baseline testing.  She continues annual Reclast, in addition to calcium and vitamin D supplements.  She will be due for Reclast again in February.  She will be due for repeat bone density in January 2026.  Iron deficiency anemia Iron deficiency diagnosed in February 2024.  The patient was instructed to take over-the-counter iron supplement daily.  She was referred to Dr. Jennye Kelly for consideration of EGD and colonoscopy, which was scheduled in May.  Unfortunately, when she went for her procedure, she developed atrial fibrillation so the procedure was canceled and she and was hospitalized.  She was discharged on Cardizem and Eliquis, but has not followed up with  cardiology.  She states she stopped her oral iron supplement after 1 to 2 months of treatment.  Her hemoglobin was 13.4 at Dr. Renea Kelly office in June.  Her hemoglobin has dropped since June. I recommended she resume OTC iron supplement daily for at least 3 months. She states she is due to see the cardiologist next week, asked her to talk to them about when she could have a repeat EGD and colonoscopy.  She states she is not sure about doing colonoscopy, as she had stool incontinence with the last prep.  She is willing to talk to Dr. Jennye Kelly regarding that.    The patient understands the plans discussed today and is in agreement with them.  She knows to contact our office if she develops concerns prior to her next appointment.   I provided 20 minutes of face-to-face time during this encounter and > 50% was spent counseling as documented under my assessment and plan.    Monique Perl, PA-C  [facility] [address] [place] [address] Dept: [phone] Dept Fax: [phone]   Orders Placed This Encounter  Procedures   MM 3D SCREENING MAMMOGRAM BILATERAL BREAST    FIE:PPIRJJOACZ ADV ANNUAL// NO PROBLEMS// SHE DO HX OF BREAST CANCER  PREV: 07-08-22 [facility]  NO NEEDS EG SW PT  PT AWARE $75 NO SHOW/CANCELLATION FEE WITHIN 24 HOURS    Standing Status:   Future    Standing Expiration Date:   01/19/2024    Order Specific Question:   Reason for  Exam (SYMPTOM  OR DIAGNOSIS REQUIRED)    Answer:   ANNUAL    Order Specific Question:   Preferred imaging location?    Answer:   [facility]   CBC with Differential (Cancer Center Only)    Standing Status:   Future    Standing Expiration Date:   01/19/2024   CMP (Cancer Center only)    Standing Status:   Future    Standing Expiration Date:   01/19/2024   Ferritin    Standing Status:   Future    Standing Expiration Date:   01/19/2024   Iron and TIBC    Standing Status:   Future     Standing Expiration Date:   01/19/2024      CHIEF COMPLAINT:  CC: History of stage IIA hormone receptor positive breast cancer  Current Treatment: Letrozole 2.5 mg daily  HISTORY OF PRESENT ILLNESS:   Oncology History  Breast cancer of upper-outer quadrant of right female breast (HCC)  05/30/2017 Initial Diagnosis   Breast cancer of upper-outer quadrant of right female breast (HCC)   06/10/2017 Cancer Staging   Staging form: Breast, AJCC 8th Edition - Clinical stage from 06/10/2017: Stage IIA (cT2, cN0(sn), cM0, G3, ER+, PR+, HER2-) - Signed by Dellia Beckwith, MD on 01/24/2021 Histopathologic type: Infiltrating duct carcinoma, NOS Stage prefix: Initial diagnosis Method of lymph node assessment: Sentinel lymph node biopsy Nuclear grade: G3 Multigene prognostic tests performed: EndoPredict Histologic grading system: 3 grade system Laterality: Right Tumor size (mm): 21 Lymph-vascular invasion (LVI): LVI not present (absent)/not identified Diagnostic confirmation: Positive histology Specimen type: Excision Staged by: Managing physician Menopausal status: Postmenopausal Ki-67 (%): 20 EndoPredict EPclin risk score: 4 EndoPredict EPclin risk level: High risk EndoPredict 10-year percentage risk of distant recurrence (%): 18 Stage used in treatment planning: Yes National guidelines used in treatment planning: Yes Type of national guideline used in treatment planning: NCCN Staging comments: TC x 4, lumpectomy, radiation, letrozole since Aug 2019       INTERVAL HISTORY:  Monique Kelly is here today for repeat clinical assessment.  She denies any changes in her breast.  She required a left breast biopsy in March which was benign.  At her last visit, she was diagnosed with iron deficiency and placed on over-the-counter iron supplement.  She states she took that for 1 to 2 months.  She saw Dr. Jennye Kelly was scheduled for EGD and colonoscopy in May, however, this had to be canceled as she  had atrial fibrillation, the procedure and was hospitalized.  She continues Eliquis and Cardizem, but has not followed up with cardiology.  She states she is scheduled to see the cardiologist next week.  She had a spinal stimulator placed about 6 weeks ago, as well as a injection in the left shoulder for arthritis.  Both these procedures have helped her pain.  She does feel like the stimulator needs adjusting and she is having some left shoulder pain again at this time, but not severe.  She denies melena or hematochezia.  She denies fevers or chills. She denies pain. Her appetite is good. Her weight has decreased 6 pounds over last next months .  REVIEW OF SYSTEMS:  Review of Systems  Constitutional:  Positive for fatigue. Negative for appetite change, chills, fever and unexpected weight change.  HENT:   Negative for lump/mass, mouth sores and sore throat.   Respiratory:  Negative for cough and shortness of breath.   Cardiovascular:  Negative for chest pain and leg swelling.  Gastrointestinal:  Negative for abdominal pain, blood in stool, constipation, diarrhea, nausea and vomiting.  Endocrine: Negative for hot flashes.  Genitourinary:  Negative for difficulty urinating, dysuria, frequency, hematuria and vaginal bleeding.   Musculoskeletal:  Positive for arthralgias (Left shoulder) and back pain (Low back). Negative for myalgias.  Skin:  Negative for rash.  Neurological:  Negative for dizziness and headaches.  Hematological:  Negative for adenopathy. Does not bruise/bleed easily.  Psychiatric/Behavioral:  Positive for depression and sleep disturbance. The patient is nervous/anxious.      VITALS:  Blood pressure 98/61, pulse 82, temperature 98.2 F (36.8 C), temperature source Oral, resp. rate 20, height 5\' 7"  (1.702 m), weight 244 lb 8 oz (110.9 kg), SpO2 91%.  Wt Readings from Last 3 Encounters:  01/19/23 244 lb 8 oz (110.9 kg)  10/19/22 250 lb (113.4 kg)  09/19/22 250 lb (113.4 kg)     Body mass index is 38.29 kg/m.  Performance status (ECOG): 2 - Symptomatic, <50% confined to bed  PHYSICAL EXAM:  Physical Exam Vitals and nursing note reviewed.  Constitutional:      General: She is not in acute distress.    Appearance: Normal appearance. She is not ill-appearing.  HENT:     Head: Normocephalic and atraumatic.     Mouth/Throat:     Mouth: Mucous membranes are moist.     Pharynx: Oropharynx is clear. No oropharyngeal exudate or posterior oropharyngeal erythema.  Eyes:     General: No scleral icterus.    Extraocular Movements: Extraocular movements intact.     Conjunctiva/sclera: Conjunctivae normal.     Pupils: Pupils are equal, round, and reactive to light.  Cardiovascular:     Rate and Rhythm: Normal rate and regular rhythm.     Heart sounds: Normal heart sounds. No murmur heard.    No friction rub. No gallop.  Pulmonary:     Effort: Pulmonary effort is normal.     Breath sounds: Normal breath sounds. No wheezing, rhonchi or rales.  Chest:  Breasts:    Right: Normal. No swelling, bleeding, inverted nipple, mass, nipple discharge, skin change or tenderness.     Left: Normal. No swelling, bleeding, inverted nipple, mass, nipple discharge, skin change or tenderness.     Comments: Stable postsurgical changes right breast Abdominal:     General: There is no distension.     Palpations: Abdomen is soft. There is no hepatomegaly, splenomegaly or mass.     Tenderness: There is no abdominal tenderness.  Musculoskeletal:        General: Normal range of motion.     Left hand: Laceration (Dorsum left hand, healing, no evidence of infection) present.     Cervical back: Normal range of motion and neck supple. No tenderness.     Right lower leg: Edema (1-2+) present.     Left lower leg: Edema (1-2+) present.  Lymphadenopathy:     Cervical: No cervical adenopathy.     Upper Body:     Right upper body: No supraclavicular or axillary adenopathy.     Left upper body:  No supraclavicular or axillary adenopathy.     Lower Body: No right inguinal adenopathy. No left inguinal adenopathy.  Skin:    General: Skin is warm and dry.     Coloration: Skin is not jaundiced.     Findings: No rash.  Neurological:     Mental Status: She is alert and oriented to person, place, and time.     Cranial Nerves: No cranial  nerve deficit.  Psychiatric:        Mood and Affect: Mood normal.        Behavior: Behavior normal.        Thought Content: Thought content normal.     LABS:      Latest Ref Rng & Units 01/19/2023   12:53 PM 08/11/2022   11:23 AM 07/06/2022    2:23 PM  CBC  WBC 4.0 - 10.5 K/uL 5.6  7.0  6.2   Hemoglobin 12.0 - 15.0 g/dL 16.1  09.6  9.9   Hematocrit 36.0 - 46.0 % 38.7  34.9  33.5   Platelets 150 - 400 K/uL 303  277  273       Latest Ref Rng & Units 01/19/2023   12:53 PM 08/11/2022   11:23 AM 07/06/2022    2:23 PM  CMP  Glucose 70 - 99 mg/dL 045  85  96   BUN 8 - 23 mg/dL 13  11  14    Creatinine 0.44 - 1.00 mg/dL 4.09  8.11  9.14   Sodium 135 - 145 mmol/L 143  145  141   Potassium 3.5 - 5.1 mmol/L 3.3  4.5  3.6   Chloride 98 - 111 mmol/L 103  107  104   CO2 22 - 32 mmol/L 30  24  29    Calcium 8.9 - 10.3 mg/dL 8.5  8.7  8.6   Total Protein 6.5 - 8.1 g/dL 6.1  5.9  6.1   Total Bilirubin 0.3 - 1.2 mg/dL 0.5  0.4  0.5   Alkaline Phos 38 - 126 U/L 80  98  67   AST 15 - 41 U/L 15  12  17    ALT 0 - 44 U/L 13  12  10       No results found for: "CEA1", "CEA" / No results found for: "CEA1", "CEA" No results found for: "PSA1" No results found for: "NWG956" No results found for: "CAN125"  No results found for: "TOTALPROTELP", "ALBUMINELP", "A1GS", "A2GS", "BETS", "BETA2SER", "GAMS", "MSPIKE", "SPEI" Lab Results  Component Value Date   TIBC 305 01/19/2023   TIBC 329 07/13/2022   TIBC 272 01/31/2022   FERRITIN 18 01/19/2023   FERRITIN 8 (L) 07/13/2022   FERRITIN 14 (L) 01/31/2022   IRONPCTSAT 19 01/19/2023   IRONPCTSAT 9 (L) 07/13/2022    IRONPCTSAT 18 01/31/2022   No results found for: "LDH"  STUDIES:  No results found.    HISTORY:   Past Medical History:  Diagnosis Date   A-fib (HCC) 09/08/2015   Atrial fibrillation (HCC) 2016   Sees Dr. Hillis Range. Cardiac ablation, loop recorder (removed in 2022)   Atrial fibrillation with rapid ventricular response (HCC) 04/2015   Breast cancer (HCC)    right lumpectomy   BRONCHITIS 03/10/2009   Qualifier: Diagnosis of  By: Alto Denver MD, Grayland Ormond    Chest pain with moderate risk for cardiac etiology 04/28/2015   Dehydration 06/18/2009   Qualifier: Diagnosis of  By: Mike Gip     Depression    Family history of breast cancer    Family history of colon cancer    Fibromyalgia 2010   Hyperlipidemia    Hypothyroidism (acquired) 2006   s/p thyroidectomy for nodules   Morbid obesity (HCC) 08/10/2015   Obesity    gastric bpg 2011, weight then 400 lbs, lost 100 lbs in 60 days.   Obstructive sleep apnea    does not use cpap, does not have a following  physician   Paroxysmal atrial fibrillation (HCC) 08/10/2015    Past Surgical History:  Procedure Laterality Date   ABDOMINAL HYSTERECTOMY  1992   total.    APPENDECTOMY     BREAST BIOPSY Left 08/04/2022   MM LT BREAST BX W LOC DEV 1ST LESION IMAGE BX SPEC STEREO GUIDE 08/04/2022 [facility]   BREAST BIOPSY Left 08/12/2022   MM LT BREAST BX W LOC DEV 1ST LESION IMAGE BX SPEC STEREO GUIDE 08/12/2022 [facility]   BREAST SURGERY  2019   right lumpectomy    CHOLECYSTECTOMY     COLONOSCOPY     ELECTROPHYSIOLOGIC STUDY N/A 09/08/2015   Procedure: Atrial Fibrillation Ablation;  Surgeon: Hillis Range, MD;  Location: [facility] INVASIVE CV LAB;  Service: Cardiovascular;  Laterality: N/A;   EP IMPLANTABLE DEVICE N/A 12/17/2015   Procedure: Loop Recorder Insertion;  Surgeon: Hillis Range, MD;  Location: [facility] INVASIVE CV LAB;  Service: Cardiovascular;  Laterality: N/A;   ESOPHAGOGASTRODUODENOSCOPY     EXPLORATORY LAPAROTOMY  WITH ABDOMINAL MASS EXCISION  2018   GASTRIC BYPASS  2011   lost 100 lbs, but gained some back.   implantable loop recorder removal  03/25/2020   removed 2022   NECK SURGERY  2005   laminectomy?   THYROIDECTOMY  2006   for nodules ( hot and cold nodules) Goiter compressing trachea.    Family History  Problem Relation Age of Onset   Heart failure Father    Cancer Father        colon   Heart failure Maternal Grandmother    Heart failure Maternal Grandfather    Cancer Mother        breast    Social History:  reports that she has never smoked. She has never used smokeless tobacco. She reports current alcohol use of about 1.0 standard drink of alcohol per week. She reports that she does not use drugs.The patient is alone today.  Allergies:  Allergies  Allergen Reactions   Latex Itching and Rash   Nitroglycerin     Hypotension, difficulty maintaining conciousness Hypotension, difficulty maintaining conciousness Hypotension, difficulty maintaining conciousness   Wound Dressings Dermatitis, Itching and Rash    Current Medications: Current Outpatient Medications  Medication Sig Dispense Refill   traMADol (ULTRAM) 50 MG tablet Take 50 mg by mouth as needed.     buPROPion (WELLBUTRIN XL) 150 MG 24 hr tablet Take 3 tablets (450 mg total) by mouth daily. 270 tablet 1   calcium carbonate (OS-CAL - DOSED IN MG OF ELEMENTAL CALCIUM) 1250 (500 Ca) MG tablet Take 1 tablet by mouth daily with breakfast.     cyanocobalamin 1000 MCG tablet Take by mouth. (Patient not taking: Reported on 01/19/2023)     diltiazem (CARDIZEM CD) 120 MG 24 hr capsule TAKE ONE CAPSULE BY MOUTH DAILY 90 capsule 0   ELIQUIS 5 MG TABS tablet Take 1 tablet (5 mg total) by mouth 2 (two) times daily. 60 tablet 2   ergocalciferol (VITAMIN D2) 1.25 MG (50000 UT) capsule Take by mouth. (Patient not taking: Reported on 01/19/2023)     Evolocumab (REPATHA SURECLICK) 140 MG/ML SOAJ Inject 140 mg into the skin every 14  (fourteen) days. 2 mL 12   Ferrous Sulfate (IRON PO) Take 1 tablet by mouth daily.     furosemide (LASIX) 20 MG tablet Take 1 tablet (20 mg total) by mouth daily as needed for edema. 30 tablet 2   gabapentin (NEURONTIN) 600 MG tablet Take 1 tablet (600 mg total)  by mouth 3 (three) times daily. 270 tablet 1   letrozole (FEMARA) 2.5 MG tablet TAKE ONE TABLET BY MOUTH EVERY DAY 90 tablet 3   levothyroxine (SYNTHROID) 75 MCG tablet Take 1 tablet (75 mcg total) by mouth daily. 90 tablet 0   metoprolol tartrate (LOPRESSOR) 25 MG tablet Take 1 tablet (25 mg total) by mouth daily as needed (PALPITATION/ AFIB). 90 tablet 0   omeprazole (PRILOSEC) 40 MG capsule Take 1 capsule (40 mg total) by mouth in the morning and at bedtime. 180 capsule 3   Zoledronic Acid (RECLAST IV) Inject into the vein.     Current Facility-Administered Medications  Medication Dose Route Frequency Provider Last Rate Last Admin   sodium chloride flush (NS) 0.9 % injection 10 mL  10 mL Intracatheter PRN Dellia Beckwith, MD   10 mL at 01/19/23 1320

## 2023-01-19 NOTE — Assessment & Plan Note (Signed)
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  She was treated with lumpectomy.  Due to a high Endo predict score, she received adjuvant chemotherapy with docetaxel/frusemide for 4 cycles completed in May 2019.  She received and adjuvant radiation therapy to the left breast completed in July 2019.  She was placed on adjuvant hormonal therapy with letrozole 2.5 mg daily in August 2019, so completes 5 years this month.  Left breast biopsy in March did not reveal any evidence of malignancy.  She remains without evidence of recurrent disease.  We will plan to see her back in 6 months with bilateral screening mammogram.

## 2023-01-19 NOTE — Assessment & Plan Note (Addendum)
Osteopenia, for which she is on yearly Reclast.  Bone density scan in January 2024 revealed improvement with a T-score of -2.2 in the right femur neck, previously -2.3 and a T-score of -1.1 in the total femur, previously -2.0. T-score in the left forearm was -2.9 on baseline testing.  She continues annual Reclast, in addition to calcium and vitamin D supplements.  She will be due for Reclast again in February.  She will be due for repeat bone density in January 2026.

## 2023-01-19 NOTE — Assessment & Plan Note (Addendum)
Iron deficiency diagnosed in February 2024.  The patient was instructed to take over-the-counter iron supplement daily.  She was referred to Dr. Jennye Boroughs for consideration of EGD and colonoscopy, which was scheduled in May.  Unfortunately, when she went for her procedure, she developed atrial fibrillation so the procedure was canceled and she and was hospitalized.  She was discharged on Cardizem and Eliquis, but has not followed up with cardiology.  She states she stopped her oral iron supplement after 1 to 2 months of treatment.  Her hemoglobin was 13.4 at Dr. Renea Ee office in June.  Her hemoglobin has dropped since June. I recommended she resume OTC iron supplement daily for at least 3 months. She states she is due to see the cardiologist next week, asked her to talk to them about when she could have a repeat EGD and colonoscopy.  She states she is not sure about doing colonoscopy, as she had stool incontinence with the last prep.  She is willing to talk to Dr. Jennye Boroughs regarding that.

## 2023-01-20 ENCOUNTER — Telehealth: Payer: Self-pay

## 2023-01-20 ENCOUNTER — Encounter: Payer: Self-pay | Admitting: Hematology and Oncology

## 2023-01-20 DIAGNOSIS — G894 Chronic pain syndrome: Secondary | ICD-10-CM | POA: Diagnosis not present

## 2023-01-20 DIAGNOSIS — M5416 Radiculopathy, lumbar region: Secondary | ICD-10-CM | POA: Diagnosis not present

## 2023-01-20 NOTE — Telephone Encounter (Signed)
Patient advised as below and will start daily iron.

## 2023-01-20 NOTE — Telephone Encounter (Signed)
-----   Message from Monique Kelly sent at 01/20/2023  8:08 AM EDT ----- Please let her know her HgB is down slightly from June. Her iron stores are still low, so I would recommend she take OTC iron supplement daily for at least 3 months. Thanks

## 2023-01-24 ENCOUNTER — Encounter: Payer: Self-pay | Admitting: Family Medicine

## 2023-01-24 ENCOUNTER — Ambulatory Visit (INDEPENDENT_AMBULATORY_CARE_PROVIDER_SITE_OTHER): Payer: PPO | Admitting: Family Medicine

## 2023-01-24 ENCOUNTER — Telehealth: Payer: Self-pay

## 2023-01-24 VITALS — BP 106/60 | HR 72 | Temp 96.2°F | Resp 18 | Ht 67.0 in | Wt 243.0 lb

## 2023-01-24 DIAGNOSIS — C50919 Malignant neoplasm of unspecified site of unspecified female breast: Secondary | ICD-10-CM

## 2023-01-24 DIAGNOSIS — E039 Hypothyroidism, unspecified: Secondary | ICD-10-CM | POA: Diagnosis not present

## 2023-01-24 DIAGNOSIS — I480 Paroxysmal atrial fibrillation: Secondary | ICD-10-CM | POA: Diagnosis not present

## 2023-01-24 DIAGNOSIS — E782 Mixed hyperlipidemia: Secondary | ICD-10-CM | POA: Diagnosis not present

## 2023-01-24 DIAGNOSIS — R0789 Other chest pain: Secondary | ICD-10-CM

## 2023-01-24 NOTE — Progress Notes (Unsigned)
Subjective:  Patient ID: Monique Kelly, female    DOB: 1956-01-02  Age: 67 y.o. MRN: [id number]  Chief Complaint  Patient presents with   Medical Management of Chronic Issues    HPI Patient experienced chest pain on Sunday night.  It awoke her from sleep.  It was associate with some shortness of breath.  It lasted for approximately 4 hours.  She did not take anything other than Tylenol.  Patient assumed it was her atrial fibrillation.  GERD: Taking omeprazole 40 mg daily   A. FIB: Taking Metoprolol 25 mg daily as needed.    Hypothyroidism: Takes synthroid 75 mcg daily.  Last TSH was March 2024 and was normal.  Hyperlipidemia: on repatha 140 mg IM every 2 weeks.   Depression well-controlled on wellbutrin 450 mg daily   Lumbar back pain: spinal ablation did not help.  Patient got her spine stimulator in June 2024 and significantly helped.  Patient is taking gabapentin 600 mg 3 in a.m. and 1 in PM as needed.  Breast cancer: on letrozole 2.5 mg daily.  On reclast IV annually through Dr. Gilman Buttner. Recently saw oncology and had blood count and chemistry panel.      08/11/2022    9:51 AM 06/23/2022    1:51 PM 03/24/2022    2:13 PM 08/04/2021   11:49 AM 03/19/2021   11:02 AM  Depression screen PHQ 2/9  Decreased Interest 0 0 1 0 2       01/24/2023   11:21 AM 10/19/2022    2:44 PM 08/11/2022    9:51 AM 06/23/2022    1:51 PM 03/24/2022    2:13 PM  Depression screen PHQ 2/9  Decreased Interest 0 1 0 0 1  Down, Depressed, Hopeless 1 1 0 0 1  PHQ - 2 Score 1 2 0 0 2  Altered sleeping 3 3  3 3   Tired, decreased energy 3 3  3 3   Change in appetite 0 1  0 0  Feeling bad or failure about yourself  0 0  0 0  Trouble concentrating 1 2  1 1   Moving slowly or fidgety/restless 0 0  0 0  Suicidal thoughts 0 0  0 0  PHQ-9 Score 8 11  7 9   Difficult doing work/chores Not difficult at all Not difficult at all  Not difficult at all Somewhat difficult        10/19/2022    2:44 PM  Fall  Risk   Falls in the past year? 1  Number falls in past yr: 1  Injury with Fall? 0  Risk for fall due to : No Fall Risks  Follow up Falls evaluation completed    Patient Care Team: Blane Ohara, MD as PCP - General (Family Medicine) Hillis Range, MD (Inactive) as PCP - Electrophysiology (Cardiology) Hillis Range, MD (Inactive) as Consulting Physician (Cardiology) Dellia Beckwith, MD as Consulting Physician (Oncology) Darleene Cleaver, MD as Consulting Physician (General Surgery) Misenheimer, Marcial Pacas, MD as Consulting Physician (Unknown Physician Specialty) Althea Grimmer, DDS as Referring Physician (Orthodontics) Lininger, Veverly Fells., MD as Referring Physician (Surgery) Regan Lemming, MD as Consulting Physician (Cardiology)   Review of Systems  Constitutional:  Negative for chills, fatigue and fever.  HENT:  Negative for congestion, rhinorrhea and sore throat.   Respiratory:  Positive for cough and shortness of breath.   Cardiovascular:  Positive for chest pain and leg swelling.  Gastrointestinal:  Negative for abdominal pain, constipation, diarrhea, nausea and vomiting.  Genitourinary:  Negative for dysuria and urgency.  Musculoskeletal:  Positive for back pain (spinal stimulator  June 24). Negative for myalgias.  Neurological:  Negative for dizziness, weakness, light-headedness and headaches.  Psychiatric/Behavioral:  Negative for dysphoric mood. The patient is not nervous/anxious.     Current Outpatient Medications on File Prior to Visit  Medication Sig Dispense Refill   buPROPion (WELLBUTRIN XL) 150 MG 24 hr tablet Take 3 tablets (450 mg total) by mouth daily. 270 tablet 1   calcium carbonate (OS-CAL - DOSED IN MG OF ELEMENTAL CALCIUM) 1250 (500 Ca) MG tablet Take 1 tablet by mouth daily with breakfast.     cyanocobalamin 1000 MCG tablet Take by mouth. (Patient not taking: Reported on 01/19/2023)     diltiazem (CARDIZEM CD) 120 MG 24 hr capsule TAKE ONE CAPSULE BY MOUTH  DAILY 90 capsule 0   ELIQUIS 5 MG TABS tablet Take 1 tablet (5 mg total) by mouth 2 (two) times daily. 60 tablet 2   ergocalciferol (VITAMIN D2) 1.25 MG (50000 UT) capsule Take by mouth. (Patient not taking: Reported on 01/19/2023)     Evolocumab (REPATHA SURECLICK) 140 MG/ML SOAJ Inject 140 mg into the skin every 14 (fourteen) days. 2 mL 12   Ferrous Sulfate (IRON PO) Take 1 tablet by mouth daily.     furosemide (LASIX) 20 MG tablet Take 1 tablet (20 mg total) by mouth daily as needed for edema. 30 tablet 2   gabapentin (NEURONTIN) 600 MG tablet Take 1 tablet (600 mg total) by mouth 3 (three) times daily. 270 tablet 1   letrozole (FEMARA) 2.5 MG tablet TAKE ONE TABLET BY MOUTH EVERY DAY 90 tablet 3   levothyroxine (SYNTHROID) 75 MCG tablet Take 1 tablet (75 mcg total) by mouth daily. 90 tablet 0   metoprolol tartrate (LOPRESSOR) 25 MG tablet Take 1 tablet (25 mg total) by mouth daily as needed (PALPITATION/ AFIB). 90 tablet 0   omeprazole (PRILOSEC) 40 MG capsule Take 1 capsule (40 mg total) by mouth in the morning and at bedtime. 180 capsule 3   traMADol (ULTRAM) 50 MG tablet Take 50 mg by mouth as needed.     Zoledronic Acid (RECLAST IV) Inject into the vein.     No current facility-administered medications on file prior to visit.   Past Medical History:  Diagnosis Date   A-fib (HCC) 09/08/2015   Atrial fibrillation (HCC) 2016   Sees Dr. Hillis Range. Cardiac ablation, loop recorder (removed in 2022)   Atrial fibrillation with rapid ventricular response (HCC) 04/2015   Breast cancer (HCC)    right lumpectomy   BRONCHITIS 03/10/2009   Qualifier: Diagnosis of  By: Alto Denver MD, Grayland Ormond    Chest pain with moderate risk for cardiac etiology 04/28/2015   Dehydration 06/18/2009   Qualifier: Diagnosis of  By: Mike Gip     Depression    Fall 07/20/2021   Family history of breast cancer    Family history of colon cancer    Fibromyalgia 2010   Hyperlipidemia    Hypothyroidism  (acquired) 2006   s/p thyroidectomy for nodules   Morbid obesity (HCC) 08/10/2015   Obesity    gastric bpg 2011, weight then 400 lbs, lost 100 lbs in 60 days.   Obstructive sleep apnea    does not use cpap, does not have a following physician   Paroxysmal atrial fibrillation (HCC) 08/10/2015   Past Surgical History:  Procedure Laterality Date   ABDOMINAL HYSTERECTOMY  1992   total.  APPENDECTOMY     BREAST BIOPSY Left 08/04/2022   MM LT BREAST BX W LOC DEV 1ST LESION IMAGE BX SPEC STEREO GUIDE 08/04/2022 [facility]   BREAST BIOPSY Left 08/12/2022   MM LT BREAST BX W LOC DEV 1ST LESION IMAGE BX SPEC STEREO GUIDE 08/12/2022 [facility]   BREAST SURGERY  2019   right lumpectomy    CHOLECYSTECTOMY     COLONOSCOPY     ELECTROPHYSIOLOGIC STUDY N/A 09/08/2015   Procedure: Atrial Fibrillation Ablation;  Surgeon: Hillis Range, MD;  Location[facility] INVASIVE CV LAB;  Service: Cardiovascular;  Laterality: N/A;   EP IMPLANTABLE DEVICE N/A 12/17/2015   Procedure: Loop Recorder Insertion;  Surgeon: Hillis Range, MD;  Location: [facility] INVASIVE CV LAB;  Service: Cardiovascular;  Laterality: N/A;   ESOPHAGOGASTRODUODENOSCOPY     EXPLORATORY LAPAROTOMY WITH ABDOMINAL MASS EXCISION  2018   GASTRIC BYPASS  2011   lost 100 lbs, but gained some back.   implantable loop recorder removal  03/25/2020   removed 2022   NECK SURGERY  2005   laminectomy?   THYROIDECTOMY  2006   for nodules ( hot and cold nodules) Goiter compressing trachea.    Family History  Problem Relation Age of Onset   Heart failure Father    Cancer Father        colon   Heart failure Maternal Grandmother    Heart failure Maternal Grandfather    Cancer Mother        breast   Social History   Socioeconomic History   Marital status: Single    Spouse name: Not on file   Number of children: Not on file   Years of education: Not on file   Highest education level: Bachelor's degree (e.g., BA, AB, BS)  Occupational  History   Occupation: Economist  Tobacco Use   Smoking status: Never   Smokeless tobacco: Never  Vaping Use   Vaping status: Never Used  Substance and Sexual Activity   Alcohol use: Yes    Alcohol/week: 1.0 standard drink of alcohol    Types: 1 Standard drinks or equivalent per week    Comment: rare glass of wine   Drug use: No   Sexual activity: Not Currently  Other Topics Concern   Not on file  Social History Narrative   Pt lives in [place] alone. 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   Social Determinants of Health   Financial Resource Strain: High Risk (09/15/2022)   Overall Financial Resource Strain (CARDIA)    Difficulty of Paying Living Expenses: Hard  Food Insecurity: No Food Insecurity (09/15/2022)   Hunger Vital Sign    Worried About Running Out of Food in the Last Year: Never true    Ran Out of Food in the Last Year: Never true  Transportation Needs: No Transportation Needs (09/15/2022)   PRAPARE - Administrator, Civil Service (Medical): No    Lack of Transportation (Non-Medical): No  Physical Activity: Inactive (09/15/2022)   Exercise Vital Sign    Days of Exercise per Week: 0 days    Minutes of Exercise per Session: 0 min  Stress: Stress Concern Present (09/15/2022)   [employer] of Occupational Health - Occupational Stress Questionnaire    Feeling of Stress : To some extent  Social Connections: Unknown (09/15/2022)   Social Connection and Isolation Panel [NHANES]    Frequency of Communication with Friends and Family: More than three times a week  Frequency of Social Gatherings with Friends and Family: Three times a week    Attends Religious Services: Patient declined    Active Member of Clubs or Organizations: No    Attends Banker Meetings: Never    Marital Status: Never married    Objective:  BP 106/60   Pulse 72   Temp (!) 96.2 F (35.7 C)   Resp 18   Ht 5\' 7"  (1.702 m)   Wt 243  lb (110.2 kg)   BMI 38.06 kg/m      01/24/2023   11:11 AM 01/19/2023    1:39 PM 10/19/2022    2:35 PM  BP/Weight  Systolic BP 106 98 112  Diastolic BP 60 61 78  Wt. (Lbs) 243 244.5 250  BMI 38.06 kg/m2 38.29 kg/m2 39.16 kg/m2    Physical Exam Vitals reviewed.  Constitutional:      Appearance: Normal appearance. She is obese.  Neck:     Vascular: No carotid bruit.  Cardiovascular:     Rate and Rhythm: Normal rate. Rhythm irregular.     Heart sounds: Normal heart sounds.  Pulmonary:     Effort: Pulmonary effort is normal. No respiratory distress.     Breath sounds: Normal breath sounds.  Abdominal:     General: Abdomen is flat. Bowel sounds are normal.     Palpations: Abdomen is soft.     Tenderness: There is no abdominal tenderness.  Musculoskeletal:     Right lower leg: Edema present.     Left lower leg: Edema present.  Neurological:     Mental Status: She is alert and oriented to person, place, and time.  Psychiatric:        Mood and Affect: Mood normal.        Behavior: Behavior normal.     Diabetic Foot Exam - Simple   No data filed      Lab Results  Component Value Date   WBC 5.6 01/19/2023   HGB 11.8 (L) 01/19/2023   HCT 38.7 01/19/2023   PLT 303 01/19/2023   GLUCOSE 101 (H) 01/19/2023   CHOL 120 05/10/2022   TRIG 88 05/10/2022   HDL 57 05/10/2022   LDLCALC 46 05/10/2022   ALT 13 01/19/2023   AST 15 01/19/2023   NA 143 01/19/2023   K 3.3 (L) 01/19/2023   CL 103 01/19/2023   CREATININE 0.95 01/19/2023   BUN 13 01/19/2023   CO2 30 01/19/2023   TSH 1.380 08/11/2022      Assessment & Plan:    Atypical chest pain Assessment & Plan: Order EKG. Follow up with cardiology this Friday.   Orders: -     EKG 12-Lead  Mixed hyperlipidemia Assessment & Plan: Well controlled.  No changes to medicines. Continue repatha Continue to work on eating a healthy diet and exercise.      Hypothyroidism (acquired) Assessment & Plan: Previously well  controlled Continue Synthroid at current dose     Paroxysmal atrial fibrillation [facility]) Assessment & Plan: The current medical regimen is effective;  continue present plan and medications.       No orders of the defined types were placed in this encounter.   Orders Placed This Encounter  Procedures   EKG 12-Lead     Follow-up: No follow-ups on file.   I,Carolyn M Morrison,acting as a Neurosurgeon for Blane Ohara, MD.,have documented all relevant documentation on the behalf of Blane Ohara, MD,as directed by  Blane Ohara, MD while in the presence of  Blane Ohara, MD.   Clayborn Bigness I Leal-Borjas,acting as a scribe for Blane Ohara, MD.,have documented all relevant documentation on the behalf of Blane Ohara, MD,as directed by  Blane Ohara, MD while in the presence of Blane Ohara, MD.    An After Visit Summary was printed and given to the patient.  Blane Ohara, MD [facility] [phone]

## 2023-01-24 NOTE — Telephone Encounter (Signed)
[name] was notified of STAT Troponin results.  The results were negative.  She was instructed to follow-up with cardiology later this week as scheduled.

## 2023-01-25 ENCOUNTER — Encounter: Payer: Self-pay | Admitting: Family Medicine

## 2023-01-27 ENCOUNTER — Ambulatory Visit: Payer: PPO | Admitting: Student

## 2023-01-28 ENCOUNTER — Encounter: Payer: Self-pay | Admitting: Family Medicine

## 2023-01-28 DIAGNOSIS — R0789 Other chest pain: Secondary | ICD-10-CM | POA: Insufficient documentation

## 2023-01-28 NOTE — Assessment & Plan Note (Signed)
The current medical regimen is effective;  continue present plan and medications.  

## 2023-01-28 NOTE — Assessment & Plan Note (Signed)
Well controlled.  No changes to medicines. Continue repatha Continue to work on eating a healthy diet and exercise.

## 2023-01-28 NOTE — Assessment & Plan Note (Signed)
Order EKG. Follow up with cardiology this Friday.

## 2023-01-28 NOTE — Assessment & Plan Note (Addendum)
Previously well controlled Continue Synthroid at current dose  

## 2023-01-29 ENCOUNTER — Encounter: Payer: Self-pay | Admitting: Family Medicine

## 2023-02-01 ENCOUNTER — Other Ambulatory Visit: Payer: Self-pay | Admitting: Family Medicine

## 2023-02-17 ENCOUNTER — Other Ambulatory Visit: Payer: Self-pay | Admitting: Pharmacist

## 2023-02-17 MED ORDER — REPATHA SURECLICK 140 MG/ML ~~LOC~~ SOAJ: 140.0000 mg | SUBCUTANEOUS | 12 refills | Status: DC

## 2023-02-20 ENCOUNTER — Other Ambulatory Visit: Payer: Self-pay | Admitting: Family Medicine

## 2023-02-28 DIAGNOSIS — M19012 Primary osteoarthritis, left shoulder: Secondary | ICD-10-CM | POA: Diagnosis not present

## 2023-03-04 ENCOUNTER — Other Ambulatory Visit: Payer: Self-pay | Admitting: Family Medicine

## 2023-03-07 ENCOUNTER — Other Ambulatory Visit: Payer: Self-pay | Admitting: Family Medicine

## 2023-03-07 DIAGNOSIS — R600 Localized edema: Secondary | ICD-10-CM

## 2023-03-08 ENCOUNTER — Ambulatory Visit: Payer: PPO | Attending: Student | Admitting: Student

## 2023-03-08 ENCOUNTER — Encounter: Payer: Self-pay | Admitting: Student

## 2023-03-08 VITALS — BP 102/70 | HR 62 | Ht 67.0 in | Wt 244.0 lb

## 2023-03-08 DIAGNOSIS — R002 Palpitations: Secondary | ICD-10-CM

## 2023-03-08 DIAGNOSIS — I4891 Unspecified atrial fibrillation: Secondary | ICD-10-CM

## 2023-03-08 DIAGNOSIS — R0602 Shortness of breath: Secondary | ICD-10-CM | POA: Diagnosis not present

## 2023-03-08 NOTE — Progress Notes (Signed)
Electrophysiology Office Note:   Date:  03/08/2023  ID:  Argyle, Ciccone 05-Feb-1956, MRN [id number]  Primary Cardiologist: None Electrophysiologist: Hillis Range, MD (Inactive)      History of Present Illness:   Monique Kelly is a 67 y.o. female with h/o Hypothyroid, HLD, OSA, and AF seen today for routine electrophysiology followup.   She was initially scheduled to see [place] acutely in September due to "atypical chest pain" but rescheduled. Troponin was WNL at that time.   Per PCP note she had approx 4 hours of chest discomfort that awoke her from sleep the preceding Sunday (9/1).   Since last being seen in our clinic the patient reports doing OK. She has episodes of fatigue and feels like her spinal stimulator is gradually doing less and less for her back pain. She has muscular weakness and fatigue despite being in NSR. She recounts an episodes within the past several weeks where she tripped and did not have the strength to get up or even sit herself up. She denies palpitations. She has had no further atypical chest pain episodes other than the one that woke her up 9/1. She is not very active.   Review of systems complete and found to be negative unless listed in HPI.   EP Information / Studies Reviewed:    EKG is not ordered today. EKG from 01/24/2023 reviewed which showed significant artifact from spinal stimulator, and despite some irregularity, does appear to show p waves in V1 and V2. Her irregularity PACs/Sinus arrhyhtmia would also be consistent with prior sinus EKGs prior to spinal stim.        Arrhythmia History  AF s/p ablation 09/08/2015  Echo 10/05/2022 LVEF 55-60%, mild LAE, trivial TR.   Physical Exam:   VS:  There were no vitals taken for this visit.   Wt Readings from Last 3 Encounters:  01/24/23 243 lb (110.2 kg)  01/19/23 244 lb 8 oz (110.9 kg)  10/19/22 250 lb (113.4 kg)     GEN: Well nourished, well developed in no acute distress NECK: No JVD; No  carotid bruits CARDIAC: Regular rate and rhythm, no murmurs, rubs, gallops RESPIRATORY:  Clear to auscultation without rales, wheezing or rhonchi  ABDOMEN: Soft, non-tender, non-distended EXTREMITIES:  No edema; No deformity   ASSESSMENT AND PLAN:    Atypical chest pain EKG today was cancelled due to tremendous artifact from Spinal stimulator.  Troponin was negative at time of her symptoms.  Cor CT 06/02/2021 with calcium 296 / 93rd percentile  She has no further   Paroxysmal AF S/p ablation 2017 Personal review of last 2 EKGs from PCP appear to show discrete P waves in some leads, despite being interpreted as atrial fibrillation Minimal episodes. Encouragement given that even her most recent EKGs as above appear to have actually shown NSR.  Continue metoprolol as needed. Can add daily/LA BB as needed Ideally, she would lose weight if she wished to be considered for another ablation.  Long discussion today, I do not think most of her symptoms are coming from AF.   OSA  Encouraged nightly CPAP (Dental device)  Deconditioning Multifactorial.  By recent EKGs, AF does not appear to be the driver of her symptoms.  No exertional chest pain.  Normal EF 09/2022  Follow up with Dr. Elberta Fortis in 6 months  Signed, Graciella Freer, PA-C

## 2023-03-08 NOTE — Patient Instructions (Signed)
Medication Instructions:  Your physician recommends that you continue on your current medications as directed. Please refer to the Current Medication list given to you today.  *If you need a refill on your cardiac medications before your next appointment, please call your pharmacy*  Lab Work: None ordered If you have labs (blood work) drawn today and your tests are completely normal, you will receive your results only by: MyChart Message (if you have MyChart) OR A paper copy in the mail If you have any lab test that is abnormal or we need to change your treatment, we will call you to review the results.  Follow-Up: At [facility], you and your health needs are our priority.  As part of our continuing mission to provide you with exceptional heart care, we have created designated Provider Care Teams.  These Care Teams include your primary Cardiologist (physician) and Advanced Practice Providers (APPs -  Physician Assistants and Nurse Practitioners) who all work together to provide you with the care you need, when you need it.  Your next appointment:   6 month(s)  Provider:   Loman Brooklyn, MD

## 2023-03-14 ENCOUNTER — Other Ambulatory Visit: Payer: Self-pay | Admitting: Family Medicine

## 2023-04-02 ENCOUNTER — Other Ambulatory Visit: Payer: Self-pay | Admitting: Family Medicine

## 2023-04-03 NOTE — Progress Notes (Unsigned)
Subjective:   Monique Kelly is a 67 y.o. female who presents for an Initial Medicare Annual Wellness Visit.  Visit Complete: {VISITMETHODVS:[phone]}  Patient Medicare AWV questionnaire was completed by the patient on ***; I have confirmed that all information answered by patient is correct and no changes since this date.        Objective:    There were no vitals filed for this visit. There is no height or weight on file to calculate BMI.     01/19/2023    1:36 PM 03/24/2022    2:25 PM 01/03/2022    2:33 PM 07/05/2021    3:24 PM 06/02/2020   11:13 AM 07/25/2019    8:23 AM 12/17/2015    3:02 PM  Advanced Directives  Does Patient Have a Medical Advance Directive? Yes Yes Yes Yes Yes No No  Type of Advance Directive Healthcare Power of [employer] Power of [employer] Power of Attorney    Does patient want to make changes to medical advance directive?  No - Patient declined       Copy of Healthcare Power of Attorney in Chart? No - copy requested No - copy requested       Would patient like information on creating a medical advance directive?      No - Patient declined No - patient declined information    Current Medications (verified) Outpatient Encounter Medications as of 04/04/2023  Medication Sig   buPROPion (WELLBUTRIN XL) 300 MG 24 hr tablet Take 1 tablet (300 mg total) by mouth daily.   calcium carbonate (OS-CAL - DOSED IN MG OF ELEMENTAL CALCIUM) 1250 (500 Ca) MG tablet Take 1 tablet by mouth daily with breakfast.   cyanocobalamin 1000 MCG tablet Take by mouth.   diltiazem (CARDIZEM CD) 120 MG 24 hr capsule TAKE ONE CAPSULE BY MOUTH DAILY   ELIQUIS 5 MG TABS tablet Take 1 tablet (5 mg total) by mouth 2 (two) times daily.   ergocalciferol (VITAMIN D2) 1.25 MG (50000 UT) capsule Take by mouth.   Evolocumab (REPATHA SURECLICK) 140 MG/ML SOAJ Inject 140 mg into the skin every 14 (fourteen) days.   Ferrous Sulfate (IRON PO) Take 1 tablet by mouth daily.    furosemide (LASIX) 20 MG tablet Take 1 tablet (20 mg total) by mouth daily as needed for edema.   gabapentin (NEURONTIN) 600 MG tablet Take 1 tablet (600 mg total) by mouth 3 (three) times daily.   letrozole (FEMARA) 2.5 MG tablet TAKE ONE TABLET BY MOUTH EVERY DAY   levothyroxine (SYNTHROID) 75 MCG tablet TAKE ONE TABLET BY MOUTH DAILY   metoprolol tartrate (LOPRESSOR) 25 MG tablet Take 1 tablet (25 mg total) by mouth daily as needed (PALPITATION/ AFIB).   omeprazole (PRILOSEC) 40 MG capsule Take 1 capsule (40 mg total) by mouth in the morning and at bedtime.   traMADol (ULTRAM) 50 MG tablet Take 50 mg by mouth as needed. (Patient not taking: Reported on 03/08/2023)   Zoledronic Acid (RECLAST IV) Inject into the vein.   No facility-administered encounter medications on file as of 04/04/2023.    Allergies (verified) Latex, Nitroglycerin, and Wound dressings   History: Past Medical History:  Diagnosis Date   A-fib (HCC) 09/08/2015   Atrial fibrillation (HCC) 2016   Sees Dr. Hillis Range. Cardiac ablation, loop recorder (removed in 2022)   Atrial fibrillation with rapid ventricular response (HCC) 04/2015   Breast cancer (HCC)    right lumpectomy   BRONCHITIS 03/10/2009  Qualifier: Diagnosis of  By: Alto Denver MD, Grayland Ormond    Chest pain with moderate risk for cardiac etiology 04/28/2015   Dehydration 06/18/2009   Qualifier: Diagnosis of  By: Mike Gip     Depression    Fall 07/20/2021   Family history of breast cancer    Family history of colon cancer    Fibromyalgia 2010   Hyperlipidemia    Hypothyroidism (acquired) 2006   s/p thyroidectomy for nodules   Morbid obesity (HCC) 08/10/2015   Obesity    gastric bpg 2011, weight then 400 lbs, lost 100 lbs in 60 days.   Obstructive sleep apnea    does not use cpap, does not have a following physician   Paroxysmal atrial fibrillation (HCC) 08/10/2015   Past Surgical History:  Procedure Laterality Date   ABDOMINAL  HYSTERECTOMY  1992   total.    APPENDECTOMY     BREAST BIOPSY Left 08/04/2022   MM LT BREAST BX W LOC DEV 1ST LESION IMAGE BX SPEC STEREO GUIDE 08/04/2022 [facility]   BREAST BIOPSY Left 08/12/2022   MM LT BREAST BX W LOC DEV 1ST LESION IMAGE BX SPEC STEREO GUIDE 08/12/2022 [facility]   BREAST SURGERY  2019   right lumpectomy    CHOLECYSTECTOMY     COLONOSCOPY     ELECTROPHYSIOLOGIC STUDY N/A 09/08/2015   Procedure: Atrial Fibrillation Ablation;  Surgeon: Hillis Range, MD;  [facility] INVAS[facility] CV LAB;  Service: Cardiovascular;  Laterality: N/A;   EP IMPLANTABLE DEVICE N/A 12/17/2015   Procedure: Loop Recorder Insertion;  Surgeon: Hillis Range, MD;  Location: [facility] INVASIVE CV LAB;  Service: Cardiovascular;  Laterality: N/A;   ESOPHAGOGASTRODUODENOSCOPY     EXPLORATORY LAPAROTOMY WITH ABDOMINAL MASS EXCISION  2018   GASTRIC BYPASS  2011   lost 100 lbs, but gained some back.   implantable loop recorder removal  03/25/2020   removed 2022   NECK SURGERY  2005   laminectomy?   THYROIDECTOMY  2006   for nodules ( hot and cold nodules) Goiter compressing trachea.   Family History  Problem Relation Age of Onset   Heart failure Father    Cancer Father        colon   Heart failure Maternal Grandmother    Heart failure Maternal Grandfather    Cancer Mother        breast   Social History   Socioeconomic History   Marital status: Single    Spouse name: Not on file   Number of children: Not on file   Years of education: Not on file   Highest education level: Bachelor's degree (e.g., BA, AB, BS)  Occupational History   Occupation: Economist  Tobacco Use   Smoking status: Never   Smokeless tobacco: Never  Vaping Use   Vaping status: Never Used  Substance and Sexual Activity   Alcohol use: Yes    Alcohol/week: 1.0 standard drink of alcohol    Types: 1 Standard drinks or equivalent per week    Comment: rare glass of wine   Drug use: No   Sexual  activity: Not Currently  Other Topics Concern   Not on file  Social History Narrative   Pt lives in [place] alone. 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   Social Determinants of Health   Financial Resource Strain: High Risk (09/15/2022)   Overall Financial Resource Strain (CARDIA)    Difficulty of Paying Living Expenses: Hard  Food Insecurity:  No Food Insecurity (09/15/2022)   Hunger Vital Sign    Worried About Running Out of Food in the Last Year: Never true    Ran Out of Food in the Last Year: Never true  Transportation Needs: No Transportation Needs (09/15/2022)   PRAPARE - Administrator, Civil Service (Medical): No    Lack of Transportation (Non-Medical): No  Physical Activity: Unknown (09/15/2022)   Exercise Vital Sign    Days of Exercise per Week: 0 days    Minutes of Exercise per Session: Not on file  Recent Concern: Physical Activity - Inactive (09/15/2022)   Exercise Vital Sign    Days of Exercise per Week: 0 days    Minutes of Exercise per Session: 0 min  Stress: Stress Concern Present (09/15/2022)   [employer] of Occupational Health - Occupational Stress Questionnaire    Feeling of Stress : To some extent  Social Connections: Unknown (09/15/2022)   Social Connection and Isolation Panel [NHANES]    Frequency of Communication with Friends and Family: More than three times a week    Frequency of Social Gatherings with Friends and Family: Three times a week    Attends Religious Services: Patient declined    Active Member of Clubs or Organizations: No    Attends Banker Meetings: Not on file    Marital Status: Never married    Tobacco Counseling Counseling given: Not Answered   Clinical Intake:              How often do you need to have someone help you when you read instructions, pamphlets, or other written materials from your doctor or pharmacy?: (P) 1 - Never         Activities of Daily Living     03/29/2023    1:37 PM 06/23/2022    1:52 PM  In your present state of health, do you have any difficulty performing the following activities:  Hearing? 0 0  Vision? 0 0  Difficulty concentrating or making decisions? 1 0  Walking or climbing stairs? 1 0  Dressing or bathing? 0 0  Doing errands, shopping? 0 0  Preparing Food and eating ? N   Using the Toilet? N   In the past six months, have you accidently leaked urine? N   Do you have problems with loss of bowel control? N   Managing your Medications? Y   Managing your Finances? N   Housekeeping or managing your Housekeeping? N     Patient Care Team: Blane Ohara, MD as PCP - General (Family Medicine) Regan Lemming, MD as PCP - Electrophysiology (Cardiology) Dellia Beckwith, MD as Consulting Physician (Oncology) Darleene Cleaver, MD as Consulting Physician (General Surgery) Misenheimer, Marcial Pacas, MD as Consulting Physician (Unknown Physician Specialty) Althea Grimmer, DDS as Referring Physician (Orthodontics) Lininger, Veverly Fells., MD as Referring Physician (Surgery)  Indicate any recent Medical Services you may have received from other than [facility] providers in the past year (date may be approximate).     Assessment:   This is a routine wellness examination for [place].  Hearing/Vision screen No results found.   Goals Addressed   None   Depression Screen    01/24/2023   11:21 AM 10/19/2022    2:44 PM 08/11/2022    9:51 AM 06/23/2022    1:51 PM 03/24/2022    2:13 PM 08/04/2021   11:49 AM 03/19/2021   11:02 AM  PHQ 2/9 Scores  PHQ - 2 Score 1  2 0 0 2 0 3  PHQ- 9 Score 8 11  7 9  11     Fall Risk    03/29/2023    1:37 PM 10/19/2022    2:44 PM 08/11/2022    9:51 AM 06/23/2022    1:50 PM 05/10/2022   10:57 AM  Fall Risk   Falls in the past year? 0 1 1 1 1   Number falls in past yr:  1 1 1 1   Injury with Fall? 0 0 0 0 0  Risk for fall due to :  No Fall Risks History of fall(s) History of fall(s) History of fall(s)  Follow up   Falls evaluation completed Falls evaluation completed Falls evaluation completed Falls evaluation completed    MEDICARE RISK AT HOME: Medicare Risk at Home Any stairs in or around the home?: (P) No If so, are there any without handrails?: (P) No Home free of loose throw rugs in walkways, pet beds, electrical cords, etc?: (P) No Adequate lighting in your home to reduce risk of falls?: (P) Yes Life alert?: (P) No Use of a cane, walker or w/c?: (P) No Grab bars in the bathroom?: (P) Yes Shower chair or bench in shower?: (P) No Elevated toilet seat or a handicapped toilet?: (P) No  TIMED UP AND GO:  Was the test performed? {[phone]}    Cognitive Function:    03/24/2022    3:19 PM  MMSE - Mini Mental State Exam  Orientation to time 5  Orientation to Place 5  Registration 3  Attention/ Calculation 5  Recall 2  Language- name 2 objects 2  Language- repeat 1  Language- follow 3 step command 3  Language- read & follow direction 1  Write a sentence 1  Copy design 0  Total score 28        Immunizations Immunization History  Administered Date(s) Administered   Fluad Quad(high Dose 65+) 03/24/2022   Influenza Inj Mdck Quad Pf 02/02/2021   Influenza,inj,Quad PF,6+ Mos 02/23/2019   Janssen (J&J) SARS-COV-2 Vaccination 08/14/2019   Pfizer Covid-19 Vaccine Bivalent Booster 22yrs & up 03/19/2021    {TDAP status:2101805}  {Flu Vaccine status:2101806}  {Pneumococcal vaccine status:2101807}  {Covid-19 vaccine status:2101808}  Qualifies for Shingles Vaccine? {YES/NO:21197}  Zostavax completed {YES/NO:21197}  {Shingrix Completed?:2101804}  Screening Tests Health Maintenance  Topic Date Due   DTaP/Tdap/Td (1 - Tdap) Never done   Zoster Vaccines- Shingrix (1 of 2) Never done   Pneumonia Vaccine 57+ Years old (1 of 1 - PCV) Never done   INFLUENZA VACCINE  12/22/2022   COVID-19 Vaccine (3 - 2023-24 season) 01/22/2023   Medicare Annual Wellness (AWV)   04/03/2024   MAMMOGRAM  07/25/2024   Colonoscopy  07/08/2026   DEXA SCAN  Completed   HPV VACCINES  Aged Out   Hepatitis C Screening  Discontinued    Health Maintenance  Health Maintenance Due  Topic Date Due   DTaP/Tdap/Td (1 - Tdap) Never done   Zoster Vaccines- Shingrix (1 of 2) Never done   Pneumonia Vaccine 39+ Years old (1 of 1 - PCV) Never done   INFLUENZA VACCINE  12/22/2022   COVID-19 Vaccine (3 - 2023-24 season) 01/22/2023    {Colorectal cancer screening:2101809}  {Mammogram status:21018020}  {Bone Density status:21018021}  Lung Cancer Screening: (Low Dose CT Chest recommended if Age 4-80 years, 20 pack-year currently smoking OR have quit w/in 15years.) {DOES NOT does:27190::"does not"} qualify.   Lung Cancer Screening Referral: ***  Additional Screening:  Hepatitis C Screening: {  DOES NOT does:27190::"does not"} qualify; Completed ***  Vision Screening: Recommended annual ophthalmology exams for early detection of glaucoma and other disorders of the eye. Is the patient up to date with their annual eye exam?  {YES/NO:21197} Who is the provider or what is the name of the office in which the patient attends annual eye exams? *** If pt is not established with a provider, would they like to be referred to a provider to establish care? {YES/NO:21197}.   Dental Screening: Recommended annual dental exams for proper oral hygiene  Diabetic Foot Exam: {Diabetic Foot Exam:2101802}  Community Resource Referral / Chronic Care Management: CRR required this visit?  {YES/NO:21197}  CCM required this visit?  {CCM Required choices:2297924931}     Plan:     I have personally reviewed and noted the following in the patient's chart:   Medical and social history Use of alcohol, tobacco or illicit drugs  Current medications and supplements including opioid prescriptions. {Opioid Prescriptions:[phone]} Functional ability and status Nutritional status Physical  activity Advanced directives List of other physicians Hospitalizations, surgeries, and ER visits in previous 12 months Vitals Screenings to include cognitive, depression, and falls Referrals and appointments  In addition, I have reviewed and discussed with patient certain preventive protocols, quality metrics, and best practice recommendations. A written personalized care plan for preventive services as well as general preventive health recommendations were provided to patient.     Eugenie Norrie, CMA   04/03/2023   After Visit Summary: {CHL AMB AWV After Visit Summary:[phone]}  Nurse Notes: ***

## 2023-04-03 NOTE — Patient Instructions (Signed)

## 2023-04-04 ENCOUNTER — Ambulatory Visit (INDEPENDENT_AMBULATORY_CARE_PROVIDER_SITE_OTHER): Payer: PPO | Admitting: Family Medicine

## 2023-04-04 ENCOUNTER — Encounter: Payer: Self-pay | Admitting: Family Medicine

## 2023-04-04 VITALS — BP 108/70 | HR 88 | Temp 97.6°F | Ht 67.0 in | Wt 245.0 lb

## 2023-04-04 DIAGNOSIS — F331 Major depressive disorder, recurrent, moderate: Secondary | ICD-10-CM

## 2023-04-04 DIAGNOSIS — Z0000 Encounter for general adult medical examination without abnormal findings: Secondary | ICD-10-CM

## 2023-04-04 DIAGNOSIS — Z23 Encounter for immunization: Secondary | ICD-10-CM

## 2023-04-04 MED ORDER — VENLAFAXINE HCL ER 75 MG PO CP24: 75.0000 mg | ORAL_CAPSULE | Freq: Every day | ORAL | 2 refills | Status: DC

## 2023-04-05 DIAGNOSIS — Z23 Encounter for immunization: Secondary | ICD-10-CM | POA: Insufficient documentation

## 2023-04-05 DIAGNOSIS — Z0000 Encounter for general adult medical examination without abnormal findings: Secondary | ICD-10-CM | POA: Insufficient documentation

## 2023-04-05 NOTE — Assessment & Plan Note (Signed)
Education given. 

## 2023-04-05 NOTE — Assessment & Plan Note (Signed)
Start on effexor xr 75 mg once daily in am.  Continue wellbutrin xl 450 mg total in am.

## 2023-04-19 ENCOUNTER — Ambulatory Visit: Payer: PPO | Admitting: Dietician

## 2023-05-11 ENCOUNTER — Other Ambulatory Visit: Payer: Self-pay | Admitting: Family Medicine

## 2023-05-27 ENCOUNTER — Other Ambulatory Visit: Payer: Self-pay | Admitting: Family Medicine

## 2023-05-28 ENCOUNTER — Other Ambulatory Visit: Payer: Self-pay | Admitting: Family Medicine

## 2023-05-28 DIAGNOSIS — R600 Localized edema: Secondary | ICD-10-CM

## 2023-05-29 ENCOUNTER — Other Ambulatory Visit: Payer: Self-pay | Admitting: Family Medicine

## 2023-06-02 ENCOUNTER — Encounter: Payer: Self-pay | Admitting: Family Medicine

## 2023-06-05 ENCOUNTER — Telehealth: Payer: Self-pay | Admitting: Family Medicine

## 2023-06-05 NOTE — Telephone Encounter (Signed)
 I left message on voicemail to call us back and sent a message on mychart app.

## 2023-06-12 NOTE — Telephone Encounter (Signed)
 error

## 2023-06-15 ENCOUNTER — Ambulatory Visit: Payer: PPO | Admitting: Physician Assistant

## 2023-06-22 ENCOUNTER — Encounter: Payer: Self-pay | Admitting: Family Medicine

## 2023-06-26 ENCOUNTER — Ambulatory Visit: Payer: PPO | Admitting: Family Medicine

## 2023-06-26 NOTE — Telephone Encounter (Signed)
The patient was scheduled for today, however the patient canceled the appointment and it was rescheduled to the end of February due to having coughing and vomiting. I have reschedule the patient to this Thursday with Huston Foley, [place] for excessive thirst, bleeding easily, dizziness, coughing and vomiting. She was unable to come in sooner.

## 2023-06-26 NOTE — Telephone Encounter (Signed)
 Reviewed. Dr Sedalia Muta

## 2023-06-28 DIAGNOSIS — G8929 Other chronic pain: Secondary | ICD-10-CM | POA: Diagnosis not present

## 2023-06-28 DIAGNOSIS — Z9689 Presence of other specified functional implants: Secondary | ICD-10-CM | POA: Diagnosis not present

## 2023-06-28 DIAGNOSIS — M47816 Spondylosis without myelopathy or radiculopathy, lumbar region: Secondary | ICD-10-CM | POA: Diagnosis not present

## 2023-06-28 DIAGNOSIS — M5416 Radiculopathy, lumbar region: Secondary | ICD-10-CM | POA: Diagnosis not present

## 2023-06-28 DIAGNOSIS — M7918 Myalgia, other site: Secondary | ICD-10-CM | POA: Diagnosis not present

## 2023-06-29 ENCOUNTER — Ambulatory Visit (INDEPENDENT_AMBULATORY_CARE_PROVIDER_SITE_OTHER): Payer: PPO | Admitting: Physician Assistant

## 2023-06-29 ENCOUNTER — Encounter: Payer: Self-pay | Admitting: Physician Assistant

## 2023-06-29 VITALS — BP 88/52 | HR 82 | Temp 98.3°F | Resp 16 | Ht 67.0 in | Wt 237.2 lb

## 2023-06-29 DIAGNOSIS — R053 Chronic cough: Secondary | ICD-10-CM

## 2023-06-29 DIAGNOSIS — Z6839 Body mass index (BMI) 39.0-39.9, adult: Secondary | ICD-10-CM

## 2023-06-29 DIAGNOSIS — E66812 Obesity, class 2: Secondary | ICD-10-CM

## 2023-06-29 DIAGNOSIS — R29898 Other symptoms and signs involving the musculoskeletal system: Secondary | ICD-10-CM | POA: Diagnosis not present

## 2023-06-29 DIAGNOSIS — F331 Major depressive disorder, recurrent, moderate: Secondary | ICD-10-CM | POA: Diagnosis not present

## 2023-06-29 DIAGNOSIS — I4891 Unspecified atrial fibrillation: Secondary | ICD-10-CM | POA: Diagnosis not present

## 2023-06-29 DIAGNOSIS — D638 Anemia in other chronic diseases classified elsewhere: Secondary | ICD-10-CM | POA: Diagnosis not present

## 2023-06-29 DIAGNOSIS — M5440 Lumbago with sciatica, unspecified side: Secondary | ICD-10-CM | POA: Diagnosis not present

## 2023-06-29 DIAGNOSIS — E538 Deficiency of other specified B group vitamins: Secondary | ICD-10-CM

## 2023-06-29 NOTE — Progress Notes (Signed)
 Acute Office Visit  Subjective:    Patient ID: Monique Kelly, female    DOB: 08/20/1955, 68 y.o.   MRN: [id number]  Chief Complaint  Patient presents with   Diabetes    Discussed the use of AI scribe software for clinical note transcription with the patient, who gave verbal consent to proceed.   HPI: Patient is in today for diabetes check. Patient states she has excessive thirst. Patient stated that PA at the pain clinic stated she needed to have EKG done. Patient also stated she has a wound on her shin that has taken 3 months to finally heal.  Discussed the use of AI scribe software for clinical note transcription with the patient, who gave verbal consent to proceed.  History of Present Illness   The patient, with a history of AFib, chronic pain managed with an implant, and breast cancer, presents with concerns about her AFib status. She reports that she was recently told she was in AFib during a visit to the pain center, with a heart rate of 153 and low oxygen levels. The patient notes that she did not feel the usual symptoms of AFib during this episode. She is currently on Eliquis  and diltiazem  for AFib management.  The patient also reports chronic shortness of breath, which worsens with activity. She mentions that she sometimes has to break down simple tasks into stages due to shortness of breath. This has been ongoing for years and has not worsened recently.  The patient also reports increased thirst and frequent urination. She expresses concern about these symptoms, linking them to potential diabetes. She also mentions slow wound healing, citing a leg wound that took three months to heal after a minor scratch from her dog.  The patient also reports a chronic cough that worsens at night. She mentions that this cough has been disruptive to her sleep.  The patient also mentions chronic pain, managed with a [employer] spinal stimulator implant. She reports that she has been  experiencing increased difficulty walking, especially at night, due to pain. She mentions that she has been told this is due to arthritis and is scheduled for steroid injections and a possible ablation.  The patient also reports occasional lightheadedness, occurring about twice a week. She mentions a history of passing out, with the last episode occurring after a CT scan.       Past Medical History:  Diagnosis Date   A-fib (HCC) 09/08/2015   Atrial fibrillation (HCC) 2016   Sees Dr. Lynwood Rakers. Cardiac ablation, loop recorder (removed in 2022)   Atrial fibrillation with rapid ventricular response (HCC) 04/2015   Breast cancer (HCC)    right lumpectomy   BRONCHITIS 03/10/2009   Qualifier: Diagnosis of  By: Perley MD, Ronal Dines    Chest pain with moderate risk for cardiac etiology 04/28/2015   Dehydration 06/18/2009   Qualifier: Diagnosis of  By: Nonnie ROSALEA Iha     Depression    Fall 07/20/2021   Family history of breast cancer    Family history of colon cancer    Fibromyalgia 2010   Hyperlipidemia    Hypothyroidism (acquired) 2006   s/p thyroidectomy for nodules   Morbid obesity (HCC) 08/10/2015   Obesity    gastric bpg 2011, weight then 400 lbs, lost 100 lbs in 60 days.   Obstructive sleep apnea    does not use cpap, does not have a following physician   Paroxysmal atrial fibrillation (HCC) 08/10/2015    Past  Surgical History:  Procedure Laterality Date   ABDOMINAL HYSTERECTOMY  1992   total.    APPENDECTOMY     BREAST BIOPSY Left 08/04/2022   MM LT BREAST BX W LOC DEV 1ST LESION IMAGE BX SPEC STEREO GUIDE 08/04/2022 [facility]   BREAST BIOPSY Left 08/12/2022   MM LT BREAST BX W LOC DEV 1ST LESION IMAGE BX SPEC STEREO GUIDE 08/12/2022 [facility]   BREAST SURGERY  2019   right lumpectomy    CHOLECYSTECTOMY     COLONOSCOPY     ELECTROPHYSIOLOGIC STUDY N/A 09/08/2015   Procedure: Atrial Fibrillation Ablation;  Surgeon: Lynwood Rakers, MD;  Location: [facility]  INVASIVE CV LAB;  Service: Cardiovascular;  Laterality: N/A;   EP IMPLANTABLE DEVICE N/A 12/17/2015   Procedure: Loop Recorder Insertion;  Surgeon: Lynwood Rakers, MD;  Location: [facility] INVASIVE CV LAB;  Service: Cardiovascular;  Laterality: N/A;   ESOPHAGOGASTRODUODENOSCOPY     EXPLORATORY LAPAROTOMY WITH ABDOMINAL MASS EXCISION  2018   GASTRIC BYPASS  2011   lost 100 lbs, but gained some back.   implantable loop recorder removal  03/25/2020   removed 2022   NECK SURGERY  2005   laminectomy?   THYROIDECTOMY  2006   for nodules ( hot and cold nodules) Goiter compressing trachea.    Family History  Problem Relation Age of Onset   Heart failure Father    Cancer Father        colon   Heart failure Maternal Grandmother    Heart failure Maternal Grandfather    Cancer Mother        breast    Social History   Socioeconomic History   Marital status: Single    Spouse name: Not on file   Number of children: Not on file   Years of education: Not on file   Highest education level: Bachelor's degree (e.g., BA, AB, BS)  Occupational History   Occupation: Economist  Tobacco Use   Smoking status: Never   Smokeless tobacco: Never  Vaping Use   Vaping status: Never Used  Substance and Sexual Activity   Alcohol use: Yes    Alcohol/week: 1.0 standard drink of alcohol    Types: 1 Standard drinks or equivalent per week    Comment: rare glass of wine   Drug use: No   Sexual activity: Not Currently  Other Topics Concern   Not on file  Social History Narrative   Pt lives in [place] alone. Works as counsellor for [employer]   Right handed   Social Drivers of Health   Financial Resource Strain: Medium Risk (06/27/2023)   Overall Financial Resource Strain (CARDIA)    Difficulty of Paying Living Expenses: Somewhat hard  Food Insecurity: No Food Insecurity (06/27/2023)   Hunger Vital Sign    Worried About Running Out of Food in the Last Year: Never true    Ran Out of  Food in the Last Year: Never true  Transportation Needs: No Transportation Needs (06/27/2023)   PRAPARE - Administrator, Civil Service (Medical): No    Lack of Transportation (Non-Medical): No  Physical Activity: Inactive (06/27/2023)   Exercise Vital Sign    Days of Exercise per Week: 0 days    Minutes of Exercise per Session: 0 min  Stress: No Stress Concern Present (06/27/2023)   [employer] of Occupational Health - Occupational Stress Questionnaire    Feeling of Stress : Only a little  Social Connections: Socially Isolated (06/27/2023)  Social Advertising Account Executive [NHANES]    Frequency of Communication with Friends and Family: More than three times a week    Frequency of Social Gatherings with Friends and Family: Twice a week    Attends Religious Services: Never    Database Administrator or Organizations: No    Attends Banker Meetings: Never    Marital Status: Never married  Intimate Partner Violence: Not At Risk (04/04/2023)   Humiliation, Afraid, Rape, and Kick questionnaire    Fear of Current or Ex-Partner: No    Emotionally Abused: No    Physically Abused: No    Sexually Abused: No    Outpatient Medications Prior to Visit  Medication Sig Dispense Refill   buPROPion  (WELLBUTRIN  XL) 150 MG 24 hr tablet Take 450 mg by mouth daily.     buPROPion  (WELLBUTRIN  XL) 300 MG 24 hr tablet Take 1 tablet (300 mg total) by mouth daily. 90 tablet 0   diltiazem  (CARDIZEM  CD) 120 MG 24 hr capsule TAKE ONE CAPSULE BY MOUTH DAILY 90 capsule 0   ELIQUIS  5 MG TABS tablet TAKE ONE TABLET BY MOUTH TWICE DAILY 60 tablet 2   Evolocumab  (REPATHA  SURECLICK) 140 MG/ML SOAJ Inject 140 mg into the skin every 14 (fourteen) days. 2 mL 12   Ferrous Sulfate (IRON PO) Take 1 tablet by mouth daily.     furosemide  (LASIX ) 20 MG tablet Take 1 tablet (20 mg total) by mouth daily as needed for edema. 30 tablet 2   gabapentin  (NEURONTIN ) 600 MG tablet Take 1 tablet (600 mg  total) by mouth 3 (three) times daily. 270 tablet 1   letrozole (FEMARA) 2.5 MG tablet TAKE ONE TABLET BY MOUTH EVERY DAY 90 tablet 3   levothyroxine  (SYNTHROID ) 75 MCG tablet TAKE ONE TABLET BY MOUTH DAILY 90 tablet 1   metoprolol  tartrate (LOPRESSOR ) 25 MG tablet Take 1 tablet (25 mg total) by mouth daily as needed (PALPITATION/ AFIB). 90 tablet 0   omeprazole  (PRILOSEC) 40 MG capsule Take 1 capsule (40 mg total) by mouth in the morning and at bedtime. 180 capsule 3   Zoledronic  Acid (RECLAST  IV) Inject into the vein.     calcium carbonate (OS-CAL - DOSED IN MG OF ELEMENTAL CALCIUM) 1250 (500 Ca) MG tablet Take 1 tablet by mouth daily with breakfast.     cyanocobalamin  1000 MCG tablet Take by mouth.     ergocalciferol (VITAMIN D2) 1.25 MG (50000 UT) capsule Take by mouth.     venlafaxine  XR (EFFEXOR  XR) 75 MG 24 hr capsule Take 1 capsule (75 mg total) by mouth daily with breakfast. 30 capsule 2   No facility-administered medications prior to visit.    Allergies  Allergen Reactions   Latex Itching and Rash   Nitroglycerin      Hypotension, difficulty maintaining conciousness Hypotension, difficulty maintaining conciousness Hypotension, difficulty maintaining conciousness   Wound Dressings Dermatitis, Itching and Rash    Review of Systems  Constitutional:  Positive for fatigue.  HENT: Negative.    Respiratory:  Positive for cough and shortness of breath.   Cardiovascular:  Positive for chest pain, palpitations and leg swelling.  Genitourinary:  Positive for difficulty urinating and frequency.  Musculoskeletal:  Positive for back pain and gait problem.  Skin: Negative.   Allergic/Immunologic: Negative.   Neurological:  Positive for dizziness.  Hematological: Negative.   Psychiatric/Behavioral: Negative.         Objective:        06/29/2023  1:54 PM 04/04/2023    2:16 PM 03/08/2023   11:27 AM  Vitals with BMI  Height 5' 7 5' 7 5' 7  Weight 237 lbs 3 oz 245 lbs 244 lbs   BMI 37.14 38.36 38.21  Systolic 88 108 102  Diastolic 52 70 70  Pulse 82 88 62    No data found.    Physical Exam Vitals reviewed.  Constitutional:      Appearance: Normal appearance.  Neck:     Vascular: No carotid bruit.  Cardiovascular:     Rate and Rhythm: Normal rate and regular rhythm.     Heart sounds: Normal heart sounds.  Pulmonary:     Effort: Pulmonary effort is normal.     Breath sounds: Normal breath sounds.  Abdominal:     General: Bowel sounds are normal.     Palpations: Abdomen is soft.     Tenderness: There is no abdominal tenderness.  Skin:    General: Skin is warm.     Findings: Wound present. No rash. Rash is not purpuric or urticarial.       Neurological:     Mental Status: She is alert and oriented to person, place, and time.  Psychiatric:        Mood and Affect: Mood normal.        Behavior: Behavior normal.     Health Maintenance Due  Topic Date Due   DTaP/Tdap/Td (1 - Tdap) Never done   Zoster Vaccines- Shingrix (1 of 2) Never done   COVID-19 Vaccine (4 - 2024-25 season) 05/30/2023    There are no preventive care reminders to display for this patient.   Lab Results  Component Value Date   TSH 1.380 08/11/2022   Lab Results  Component Value Date   WBC 6.0 06/29/2023   HGB 13.9 06/29/2023   HCT 41.8 06/29/2023   MCV 99 (H) 06/29/2023   PLT 240 06/29/2023   Lab Results  Component Value Date   NA 145 (H) 06/29/2023   K 3.3 (L) 06/29/2023   CO2 28 06/29/2023   GLUCOSE 88 06/29/2023   BUN 13 06/29/2023   CREATININE 1.10 (H) 06/29/2023   BILITOT 0.6 06/29/2023   ALKPHOS 110 06/29/2023   AST 12 06/29/2023   ALT 10 06/29/2023   PROT 5.7 (L) 06/29/2023   ALBUMIN 3.2 (L) 06/29/2023   CALCIUM 8.4 (L) 06/29/2023   ANIONGAP 10 01/19/2023   EGFR 55 (L) 06/29/2023   Lab Results  Component Value Date   CHOL 126 06/29/2023   Lab Results  Component Value Date   HDL 59 06/29/2023   Lab Results  Component Value Date    LDLCALC 49 06/29/2023   Lab Results  Component Value Date   TRIG 97 06/29/2023   Lab Results  Component Value Date   CHOLHDL 2.1 06/29/2023   Lab Results  Component Value Date   HGBA1C 5.4 06/29/2023       Assessment & Plan:  Weakness of both lower extremities Assessment & Plan: Patient reports occasional lightheadedness and has a history of fainting. -Order comprehensive metabolic panel, complete blood count, and cholesterol panel. -Check A1c and thyroid  function.  Patient reports needing to break down activities of daily living into stages due to shortness of breath. -Refer to physical therapy for strengthening exercises.  Orders: -     Ambulatory referral to Physical Therapy  Class 2 severe obesity due to excess calories with serious comorbidity and body mass index (BMI) of 39.0 to  39.9 in adult [facility]) Assessment & Plan: Healing wound on leg due to dog scratch. Patient on Eliquis , which may have contributed to prolonged healing. No signs of infection. -Advise patient to clean future wounds with soap and water, avoid hydrogen peroxide.  Orders: -     Comprehensive metabolic panel -     Hemoglobin A1c -     Lipid panel  Atrial fibrillation, unspecified type [facility]) Assessment & Plan: Patient on Eliquis  for anticoagulation. Recent episode of high heart rate and low oxygen saturation during a visit to the pain center. No chest pain. Shortness of breath with exertion, but no recent worsening. -Continue Eliquis . -Refer to cardiology for further evaluation and management.  Orders: -     CBC with Differential/Platelet  Chronic cough Assessment & Plan: Denies any fever, chills, or night sweats Continue to monitor for any changing symptoms Will send for chest x-ray if it becomes worse   Anemia of chronic disease Assessment & Plan: Patient has a history of anemia. -Check complete blood count.   Moderate recurrent major depression (HCC) Assessment & Plan: Patient  reports difficulty managing depression and feeling overwhelmed. -Continue Wellbutrin  450mg .   Back pain of lumbar region with sciatica Assessment & Plan: Patient has a spinal stimulator implant for chronic pain. Recent diagnosis of arthritis and plan for steroid injections. -Continue current pain management plan.   B12 deficiency Assessment & Plan: Patient has a history of gastric bypass surgery, which can lead to vitamin B12 deficiency. -Check vitamin B12 levels.  Orders: -     Vitamin B12     No orders of the defined types were placed in this encounter.   Orders Placed This Encounter  Procedures   CBC with Differential/Platelet   Comprehensive metabolic panel   Hemoglobin A1c   Lipid panel   Vitamin B12   Ambulatory referral to Physical Therapy    General Health Maintenance -Continue current medications and follow-up with specialists as needed. -Plan to review results of lab work at next visit.      Follow-up: Return in about 4 months (around 10/27/2023) for Chronic, Dr. Sherre.  An After Visit Summary was printed and given to the patient.  Nola Angles, [place] [facility] [phone]

## 2023-06-29 NOTE — Patient Instructions (Signed)
 VISIT SUMMARY:  During today's visit, we discussed several of your ongoing health concerns, including your atrial fibrillation (AFib), chronic pain, wound healing, chronic cough, generalized weakness, depression, anemia, possible vitamin B12 deficiency, and physical deconditioning. We reviewed your current medications and made plans for further evaluation and management of these issues.  YOUR PLAN:  -ATRIAL FIBRILLATION: Atrial fibrillation (AFib) is an irregular and often rapid heart rate that can increase your risk of strokes, heart failure, and other heart-related complications. You will continue taking Eliquis  for anticoagulation, and we will refer you to cardiology for further evaluation and management.  -CHRONIC PAIN: Chronic pain is long-standing pain that persists beyond the usual recovery period or occurs along with a chronic health condition. You will continue with your current pain management plan, including the use of your spinal stimulator implant and the upcoming steroid injections for arthritis.  -WOUND HEALING: Your prolonged wound healing may be influenced by your use of Eliquis , which is a blood thinner. For future wounds, clean them with soap and water and avoid using hydrogen peroxide.  -CHRONIC COUGH: A chronic cough is a cough that lasts eight weeks or longer. We will consider further evaluation to determine the potential causes of your chronic cough, especially since it worsens at night.  -GENERALIZED WEAKNESS: Generalized weakness is a feeling of overall fatigue or lack of strength. We will order a comprehensive metabolic panel, complete blood count, cholesterol panel, A1c, and thyroid  function tests to investigate the cause of your symptoms.  -DEPRESSION: Depression is a mood disorder that causes a persistent feeling of sadness and loss of interest. You will continue taking Wellbutrin  at 450mg  to help manage your symptoms.  -ANEMIA: Anemia is a condition in which you lack  enough healthy red blood cells to carry adequate oxygen to your body's tissues. We will check your complete blood count to monitor this condition.  -POSSIBLE VITAMIN B12 DEFICIENCY: Vitamin B12 deficiency can occur after gastric bypass surgery and can lead to anemia and other health issues. We will check your vitamin B12 levels to ensure they are within a healthy range.  -PHYSICAL DECONDITIONING: Physical deconditioning refers to the loss of physical fitness due to inactivity. We will refer you to physical therapy for strengthening exercises to help improve your endurance and ability to perform daily activities.  -GENERAL HEALTH MAINTENANCE: We will continue your current medications and follow up with specialists as needed. We will also review the results of your lab work at your next visit.  INSTRUCTIONS:  Please follow up with cardiology for your atrial fibrillation evaluation. Additionally, complete the lab tests we have ordered, including the comprehensive metabolic panel, complete blood count, cholesterol panel, A1c, and thyroid  function tests. We will review these results at your next visit. Continue with your current medications and attend your scheduled appointments for pain management and physical therapy.

## 2023-06-30 LAB — COMPREHENSIVE METABOLIC PANEL
ALT: 10 [IU]/L (ref 0–32)
AST: 12 [IU]/L (ref 0–40)
Albumin: 3.2 g/dL — ABNORMAL LOW (ref 3.9–4.9)
Alkaline Phosphatase: 110 [IU]/L (ref 44–121)
BUN/Creatinine Ratio: 12 (ref 12–28)
BUN: 13 mg/dL (ref 8–27)
Bilirubin Total: 0.6 mg/dL (ref 0.0–1.2)
CO2: 28 mmol/L (ref 20–29)
Calcium: 8.4 mg/dL — ABNORMAL LOW (ref 8.7–10.3)
Chloride: 101 mmol/L (ref 96–106)
Creatinine, Ser: 1.1 mg/dL — ABNORMAL HIGH (ref 0.57–1.00)
Globulin, Total: 2.5 g/dL (ref 1.5–4.5)
Glucose: 88 mg/dL (ref 70–99)
Potassium: 3.3 mmol/L — ABNORMAL LOW (ref 3.5–5.2)
Sodium: 145 mmol/L — ABNORMAL HIGH (ref 134–144)
Total Protein: 5.7 g/dL — ABNORMAL LOW (ref 6.0–8.5)
eGFR: 55 mL/min/{1.73_m2} — ABNORMAL LOW (ref >59)

## 2023-06-30 LAB — LIPID PANEL
Chol/HDL Ratio: 2.1 {ratio} (ref 0.0–4.4)
Cholesterol, Total: 126 mg/dL (ref 100–199)
HDL: 59 mg/dL (ref >39)
LDL Chol Calc (NIH): 49 mg/dL (ref 0–99)
Triglycerides: 97 mg/dL (ref 0–149)
VLDL Cholesterol Cal: 18 mg/dL (ref 5–40)

## 2023-06-30 LAB — CBC WITH DIFFERENTIAL/PLATELET
Basophils Absolute: 0.1 10*3/uL (ref 0.0–0.2)
Basos: 1 %
EOS (ABSOLUTE): 0.1 10*3/uL (ref 0.0–0.4)
Eos: 1 %
Hematocrit: 41.8 % (ref 34.0–46.6)
Hemoglobin: 13.9 g/dL (ref 11.1–15.9)
Immature Grans (Abs): 0 10*3/uL (ref 0.0–0.1)
Immature Granulocytes: 0 %
Lymphocytes Absolute: 1.6 10*3/uL (ref 0.7–3.1)
Lymphs: 27 %
MCH: 33 pg (ref 26.6–33.0)
MCHC: 33.3 g/dL (ref 31.5–35.7)
MCV: 99 fL — ABNORMAL HIGH (ref 79–97)
Monocytes Absolute: 0.3 10*3/uL (ref 0.1–0.9)
Monocytes: 6 %
Neutrophils Absolute: 4 10*3/uL (ref 1.4–7.0)
Neutrophils: 65 %
Platelets: 240 10*3/uL (ref 150–450)
RBC: 4.21 x10E6/uL (ref 3.77–5.28)
RDW: 13.3 % (ref 11.7–15.4)
WBC: 6 10*3/uL (ref 3.4–10.8)

## 2023-06-30 LAB — VITAMIN B12: Vitamin B-12: 623 pg/mL (ref 232–1245)

## 2023-06-30 LAB — HEMOGLOBIN A1C
Est. average glucose Bld gHb Est-mCnc: 108 mg/dL
Hgb A1c MFr Bld: 5.4 % (ref 4.8–5.6)

## 2023-07-03 DIAGNOSIS — R053 Chronic cough: Secondary | ICD-10-CM | POA: Insufficient documentation

## 2023-07-03 NOTE — Assessment & Plan Note (Signed)
 Patient has a history of gastric bypass surgery, which can lead to vitamin B12 deficiency. -Check vitamin B12 levels.

## 2023-07-03 NOTE — Assessment & Plan Note (Signed)
 Healing wound on leg due to dog scratch. Patient on Eliquis , which may have contributed to prolonged healing. No signs of infection. -Advise patient to clean future wounds with soap and water, avoid hydrogen peroxide.

## 2023-07-03 NOTE — Assessment & Plan Note (Signed)
 Denies any fever, chills, or night sweats Continue to monitor for any changing symptoms Will send for chest x-ray if it becomes worse

## 2023-07-03 NOTE — Assessment & Plan Note (Signed)
 Patient reports difficulty managing depression and feeling overwhelmed. -Continue Wellbutrin  450mg .

## 2023-07-03 NOTE — Assessment & Plan Note (Addendum)
 Patient reports occasional lightheadedness and has a history of fainting. -Order comprehensive metabolic panel, complete blood count, and cholesterol panel. -Check A1c and thyroid  function.  Patient reports needing to break down activities of daily living into stages due to shortness of breath. -Refer to physical therapy for strengthening exercises.

## 2023-07-03 NOTE — Assessment & Plan Note (Signed)
 Patient on Eliquis  for anticoagulation. Recent episode of high heart rate and low oxygen saturation during a visit to the pain center. No chest pain. Shortness of breath with exertion, but no recent worsening. -Continue Eliquis . -Refer to cardiology for further evaluation and management.

## 2023-07-03 NOTE — Assessment & Plan Note (Signed)
 Patient has a history of anemia. -Check complete blood count.

## 2023-07-03 NOTE — Assessment & Plan Note (Signed)
 Patient has a spinal stimulator implant for chronic pain. Recent diagnosis of arthritis and plan for steroid injections. -Continue current pain management plan.

## 2023-07-05 ENCOUNTER — Other Ambulatory Visit: Payer: Self-pay | Admitting: Physician Assistant

## 2023-07-05 ENCOUNTER — Encounter: Payer: Self-pay | Admitting: Physician Assistant

## 2023-07-05 DIAGNOSIS — E876 Hypokalemia: Secondary | ICD-10-CM

## 2023-07-05 MED ORDER — POTASSIUM CHLORIDE CRYS ER 20 MEQ PO TBCR: 20.0000 meq | EXTENDED_RELEASE_TABLET | Freq: Every day | ORAL | 3 refills | Status: DC

## 2023-07-06 ENCOUNTER — Telehealth: Payer: Self-pay

## 2023-07-06 NOTE — Telephone Encounter (Signed)
Patient called back. Results were given. Patient verbalized understanding. Patient stated that when she picks up the potassium and starts it she will call back and schedule lab appointment.

## 2023-07-07 ENCOUNTER — Ambulatory Visit: Payer: PPO | Admitting: Oncology

## 2023-07-07 ENCOUNTER — Other Ambulatory Visit: Payer: PPO

## 2023-07-08 ENCOUNTER — Other Ambulatory Visit: Payer: Self-pay | Admitting: Pharmacist

## 2023-07-10 ENCOUNTER — Ambulatory Visit: Payer: PPO

## 2023-07-11 ENCOUNTER — Ambulatory Visit: Payer: PPO

## 2023-07-13 ENCOUNTER — Inpatient Hospital Stay: Payer: PPO | Admitting: Oncology

## 2023-07-13 ENCOUNTER — Inpatient Hospital Stay: Payer: PPO

## 2023-07-17 ENCOUNTER — Inpatient Hospital Stay: Payer: PPO

## 2023-07-19 ENCOUNTER — Ambulatory Visit: Payer: PPO | Admitting: Family Medicine

## 2023-07-25 ENCOUNTER — Ambulatory Visit
Admission: RE | Admit: 2023-07-25 | Discharge: 2023-07-25 | Disposition: A | Discharge status: Home / Self Care | Payer: PPO | Source: Ambulatory Visit | Attending: Hematology and Oncology | Admitting: Hematology and Oncology

## 2023-07-25 DIAGNOSIS — Z1231 Encounter for screening mammogram for malignant neoplasm of breast: Secondary | ICD-10-CM | POA: Diagnosis not present

## 2023-07-25 DIAGNOSIS — M8580 Other specified disorders of bone density and structure, unspecified site: Secondary | ICD-10-CM

## 2023-07-25 DIAGNOSIS — Z170 Estrogen receptor positive status [ER+]: Secondary | ICD-10-CM

## 2023-07-25 DIAGNOSIS — D509 Iron deficiency anemia, unspecified: Secondary | ICD-10-CM

## 2023-07-28 ENCOUNTER — Inpatient Hospital Stay: Payer: PPO

## 2023-07-28 ENCOUNTER — Encounter: Payer: Self-pay | Admitting: Oncology

## 2023-07-28 ENCOUNTER — Other Ambulatory Visit: Payer: Self-pay | Admitting: Oncology

## 2023-07-28 ENCOUNTER — Inpatient Hospital Stay

## 2023-07-28 ENCOUNTER — Inpatient Hospital Stay: Payer: PPO | Attending: Oncology | Admitting: Oncology

## 2023-07-28 VITALS — BP 130/79 | HR 78 | Temp 98.0°F | Resp 16 | Ht 67.0 in | Wt 234.6 lb

## 2023-07-28 DIAGNOSIS — Z79811 Long term (current) use of aromatase inhibitors: Secondary | ICD-10-CM | POA: Insufficient documentation

## 2023-07-28 DIAGNOSIS — Z79899 Other long term (current) drug therapy: Secondary | ICD-10-CM | POA: Diagnosis not present

## 2023-07-28 DIAGNOSIS — C50411 Malignant neoplasm of upper-outer quadrant of right female breast: Secondary | ICD-10-CM

## 2023-07-28 DIAGNOSIS — M8580 Other specified disorders of bone density and structure, unspecified site: Secondary | ICD-10-CM

## 2023-07-28 DIAGNOSIS — D509 Iron deficiency anemia, unspecified: Secondary | ICD-10-CM

## 2023-07-28 DIAGNOSIS — Z90710 Acquired absence of both cervix and uterus: Secondary | ICD-10-CM | POA: Insufficient documentation

## 2023-07-28 DIAGNOSIS — M85851 Other specified disorders of bone density and structure, right thigh: Secondary | ICD-10-CM | POA: Diagnosis not present

## 2023-07-28 DIAGNOSIS — M2550 Pain in unspecified joint: Secondary | ICD-10-CM | POA: Diagnosis not present

## 2023-07-28 DIAGNOSIS — Z800 Family history of malignant neoplasm of digestive organs: Secondary | ICD-10-CM | POA: Diagnosis not present

## 2023-07-28 DIAGNOSIS — I480 Paroxysmal atrial fibrillation: Secondary | ICD-10-CM | POA: Insufficient documentation

## 2023-07-28 DIAGNOSIS — Z780 Asymptomatic menopausal state: Secondary | ICD-10-CM

## 2023-07-28 DIAGNOSIS — F32A Depression, unspecified: Secondary | ICD-10-CM | POA: Insufficient documentation

## 2023-07-28 DIAGNOSIS — E890 Postprocedural hypothyroidism: Secondary | ICD-10-CM | POA: Insufficient documentation

## 2023-07-28 DIAGNOSIS — Z9049 Acquired absence of other specified parts of digestive tract: Secondary | ICD-10-CM | POA: Insufficient documentation

## 2023-07-28 DIAGNOSIS — E785 Hyperlipidemia, unspecified: Secondary | ICD-10-CM | POA: Insufficient documentation

## 2023-07-28 DIAGNOSIS — G479 Sleep disorder, unspecified: Secondary | ICD-10-CM | POA: Insufficient documentation

## 2023-07-28 DIAGNOSIS — Z7901 Long term (current) use of anticoagulants: Secondary | ICD-10-CM | POA: Insufficient documentation

## 2023-07-28 DIAGNOSIS — Z8249 Family history of ischemic heart disease and other diseases of the circulatory system: Secondary | ICD-10-CM | POA: Diagnosis not present

## 2023-07-28 DIAGNOSIS — Z1732 Human epidermal growth factor receptor 2 negative status: Secondary | ICD-10-CM | POA: Insufficient documentation

## 2023-07-28 DIAGNOSIS — R7989 Other specified abnormal findings of blood chemistry: Secondary | ICD-10-CM | POA: Insufficient documentation

## 2023-07-28 DIAGNOSIS — R5383 Other fatigue: Secondary | ICD-10-CM | POA: Insufficient documentation

## 2023-07-28 DIAGNOSIS — Z170 Estrogen receptor positive status [ER+]: Secondary | ICD-10-CM

## 2023-07-28 DIAGNOSIS — Z1721 Progesterone receptor positive status: Secondary | ICD-10-CM | POA: Insufficient documentation

## 2023-07-28 DIAGNOSIS — D508 Other iron deficiency anemias: Secondary | ICD-10-CM | POA: Diagnosis not present

## 2023-07-28 DIAGNOSIS — Z9884 Bariatric surgery status: Secondary | ICD-10-CM | POA: Diagnosis not present

## 2023-07-28 LAB — CMP (CANCER CENTER ONLY)
ALT: 8 U/L (ref 0–44)
AST: 16 U/L (ref 15–41)
Albumin: 3.1 g/dL — ABNORMAL LOW (ref 3.5–5.0)
Alkaline Phosphatase: 100 U/L (ref 38–126)
Anion gap: 10 (ref 5–15)
BUN: 17 mg/dL (ref 8–23)
CO2: 28 mmol/L (ref 22–32)
Calcium: 8.4 mg/dL — ABNORMAL LOW (ref 8.9–10.3)
Chloride: 103 mmol/L (ref 98–111)
Creatinine: 1.11 mg/dL — ABNORMAL HIGH (ref 0.44–1.00)
GFR, Estimated: 55 mL/min — ABNORMAL LOW (ref >60)
Glucose, Bld: 104 mg/dL — ABNORMAL HIGH (ref 70–99)
Potassium: 3.5 mmol/L (ref 3.5–5.1)
Sodium: 141 mmol/L (ref 135–145)
Total Bilirubin: 0.5 mg/dL (ref 0.0–1.2)
Total Protein: 5.6 g/dL — ABNORMAL LOW (ref 6.5–8.1)

## 2023-07-28 LAB — CBC WITH DIFFERENTIAL (CANCER CENTER ONLY)
Abs Immature Granulocytes: 0.05 10*3/uL (ref 0.00–0.07)
Basophils Absolute: 0.1 10*3/uL (ref 0.0–0.1)
Basophils Relative: 1 %
Eosinophils Absolute: 0.1 10*3/uL (ref 0.0–0.5)
Eosinophils Relative: 1 %
HCT: 40.6 % (ref 36.0–46.0)
Hemoglobin: 13.2 g/dL (ref 12.0–15.0)
Immature Granulocytes: 1 %
Lymphocytes Relative: 19 %
Lymphs Abs: 1.4 10*3/uL (ref 0.7–4.0)
MCH: 32.8 pg (ref 26.0–34.0)
MCHC: 32.5 g/dL (ref 30.0–36.0)
MCV: 101 fL — ABNORMAL HIGH (ref 80.0–100.0)
Monocytes Absolute: 0.4 10*3/uL (ref 0.1–1.0)
Monocytes Relative: 6 %
Neutro Abs: 5.1 10*3/uL (ref 1.7–7.7)
Neutrophils Relative %: 72 %
Platelet Count: 174 10*3/uL (ref 150–400)
RBC: 4.02 MIL/uL (ref 3.87–5.11)
RDW: 15.2 % (ref 11.5–15.5)
WBC Count: 7.1 10*3/uL (ref 4.0–10.5)
nRBC: 0 % (ref 0.0–0.2)
nRBC: 0 /100{WBCs}

## 2023-07-28 LAB — FERRITIN: Ferritin: 69 ng/mL (ref 11–307)

## 2023-07-28 LAB — IRON AND TIBC
Iron: 57 ug/dL (ref 28–170)
Saturation Ratios: 29 % (ref 10.4–31.8)
TIBC: 195 ug/dL — ABNORMAL LOW (ref 250–450)
UIBC: 138 ug/dL

## 2023-07-28 MED ORDER — HEPARIN SOD (PORK) LOCK FLUSH 100 UNIT/ML IV SOLN
500.0000 [IU] | Freq: Once | INTRAVENOUS | Status: AC | PRN
  Administered 2023-07-28: 500 [IU]

## 2023-07-28 MED ORDER — ZOLEDRONIC ACID 4 MG/100ML IV SOLN
4.0000 mg | Freq: Once | INTRAVENOUS | Status: AC
  Administered 2023-07-28: 4 mg via INTRAVENOUS
  Filled 2023-07-28: qty 100

## 2023-07-28 MED ORDER — SODIUM CHLORIDE 0.9 % IV SOLN: Freq: Once | INTRAVENOUS | Status: AC

## 2023-07-28 MED ORDER — SODIUM CHLORIDE 0.9% FLUSH
10.0000 mL | INTRAVENOUS | Status: DC | PRN
  Administered 2023-07-28: 10 mL

## 2023-07-28 NOTE — Progress Notes (Signed)
 Per Dr. Gilman Buttner ok to proceed with Zometa with Calcium 8.4. Corrected calcium 9.1.

## 2023-07-28 NOTE — Patient Instructions (Signed)

## 2023-07-28 NOTE — Progress Notes (Signed)
 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  She was treated with lumpectomy.  Due to a high Endopredict score, she received adjuvant chemotherapy with docetaxel/frusemide for 4 cycles completed in May 2019.  She received adjuvant radiation therapy to the left breast,  completed in July 2019.  She was placed on adjuvant hormonal therapy with letrozole 2.5 mg daily in August 2019, so completes 5 years last year.  We discussed continuing it longer in view of her high risk for recurrence.  Left breast biopsy in March did not reveal any evidence of malignancy.  She remains without evidence of recurrent disease.     Osteopenia after menopause Osteopenia, for which she is on yearly Reclast.  Bone density scan in January 2024 revealed improvement with a T-score of -2.2 in the right femur neck, previously -2.3 and a T-score of -1.1 in the total femur, previously -2.0. T-score in the left forearm was -2.9 on baseline testing.  She continues annual Reclast, in addition to calcium and vitamin D supplements.  She is due for Reclast today.  She will be due for repeat bone density in 1 year.   Iron deficiency anemia Iron deficiency diagnosed in February 2024 and resolved with oral iron supplement. She was referred to Dr. Jennye Boroughs for consideration of EGD and colonoscopy, which was scheduled in May.  Unfortunately, when she went for her procedure, she developed atrial fibrillation so the procedure was canceled and she and was hospitalized.  She was discharged on Cardizem and Eliquis, but has not followed up with cardiology.  She states she stopped her oral iron supplement after 1 to 2 months of treatment.  Her hemoglobin  was 13.4 at Dr. Renea Ee office in June, 2024 and remains good at 13.2 today.    Plan: She continues Letrozole without difficulty since August, 2019. Patient had a screening bilateral mammogram done on 07/25/2023 that was clear and she informed me that she had a [employer] stimulator implanted in her lower back to help with pain about 6 months ago. She states that this has given her some relief but would like to look into a doctor to help her with her arthritis. She currently takes Gabapentin 600mg  QID. In May. 2024 she was scheduled to have a colonoscopy done but this was canceled as she was found to be in A.fib. She was given a medication to regulate her rate but this did not work so she was admitted into the ICU and was given IV medication to help control her rhythm. She is now on Eliquis 5 mg BID and has been for months now. She has a WBC of 7.1, hemoglobin of 13.2, and platelet count of 174,000. She has a mildly elevated creatinine of 1.11 but low calcium of 8.4, total protein of 5.6, and albumin of 3.1. The rest of her CMP is normal and she will receive her Reclast injection today. I instructed her to take a oral calcium with vitamin D supplement BID and increase potassium and calcium in her diet. She had her port flushed today. I will see her back in 1 year with CBC, CMP, bilateral mammogram, and bone density scan. She will return in 6 months for a port flush. The patient understands  the plans discussed today and is in agreement with them.  She knows to contact our office if she develops concerns prior to her next appointment.  I provided 22 minutes of face-to-face time during this encounter and > 50% was spent counseling as documented under my assessment and plan.   Dellia Beckwith, MD  [facility] - A DEPT OF [facility] [address] [place] [address] Dept: [phone] Dept Fax: [phone]   No orders of the defined types  were placed in this encounter.   CHIEF COMPLAINT:  CC: History of stage IIA hormone receptor positive breast cancer  Current Treatment: Letrozole 2.5 mg daily  HISTORY OF PRESENT ILLNESS:   Oncology History  Breast cancer of upper-outer quadrant of right female breast (HCC)  05/30/2017 Initial Diagnosis   Breast cancer of upper-outer quadrant of right female breast (HCC)   06/10/2017 Cancer Staging   Staging form: Breast, AJCC 8th Edition - Clinical stage from 06/10/2017: Stage IIA (cT2, cN0(sn), cM0, G3, ER+, PR+, HER2-) - Signed by Dellia Beckwith, MD on 01/24/2021 Histopathologic type: Infiltrating duct carcinoma, NOS Stage prefix: Initial diagnosis Method of lymph node assessment: Sentinel lymph node biopsy Nuclear grade: G3 Multigene prognostic tests performed: EndoPredict Histologic grading system: 3 grade system Laterality: Right Tumor size (mm): 21 Lymph-vascular invasion (LVI): LVI not present (absent)/not identified Diagnostic confirmation: Positive histology Specimen type: Excision Staged by: Managing physician Menopausal status: Postmenopausal Ki-67 (%): 20 EndoPredict EPclin risk score: 4 EndoPredict EPclin risk level: High risk EndoPredict 10-year percentage risk of distant recurrence (%): 18 Stage used in treatment planning: Yes National guidelines used in treatment planning: Yes Type of national guideline used in treatment planning: NCCN Staging comments: TC x 4, lumpectomy, radiation, letrozole since Aug 2019    INTERVAL HISTORY:  Monique Kelly is here today for repeat clinical assessment of her history of stage IIA hormone receptor positive breast cancer. Patient states that she feels well but complains of back and arthritic pain. She had a recent fall during a walk and is still recovering, she states her the right side of her lower ribs are still sore. She continues Letrozole without difficulty since August, 2019. Patient had a screening bilateral mammogram done on  07/25/2023 that was clear and she informed me that she had a [employer] stimulator implanted in her lower back to help with pain about 6 months ago. She states that this has given her some relief but would like to look into a doctor to help her with her arthritis. She currently takes Gabapentin 600mg  QID. In May. 2024 she was scheduled to have a colonoscopy done but this was canceled as she was found to be in atrial fibrillation. She was given a medication to regulate her rate but this did not work so she was admitted into the ICU and was given IV medication to help control her rhythm. She is now on Eliquis 5mg  BID and has been for months now. She has a WBC of 7.1, hemoglobin of 13.2, and platelet count of 174,000. She has a mildly elevated creatinine of 1.11 but low calcium of 8.4, total protein of 5.6, and albumin of 3.1. The rest of her CMP is normal and she will receive her Reclast injection today. I instructed her to take a oral calcium with vitamin D supplement BID and increase potassium and calcium in her diet. She had her port flushed today. I will see her back in 1 year with  CBC, CMP, bilateral mammogram, and bone density scan. She will return in 6 months for a port flush. She denies signs of infection such as sore throat, sinus drainage, cough, or urinary symptoms.  She denies fevers or recurrent chills. She denies nausea, vomiting, chest pain, dyspnea or cough. Her appetite is average and her weight has decreased 3 pounds over last month .   REVIEW OF SYSTEMS:  Review of Systems  Constitutional:  Positive for fatigue. Negative for appetite change, chills, diaphoresis, fever and unexpected weight change.  HENT:  Negative.  Negative for hearing loss, lump/mass, mouth sores, nosebleeds, sore throat, tinnitus, trouble swallowing and voice change.   Eyes: Negative.  Negative for eye problems and icterus.  Respiratory: Negative.  Negative for chest tightness, cough, hemoptysis, shortness of breath  and wheezing.   Cardiovascular: Negative.  Negative for chest pain, leg swelling and palpitations.  Gastrointestinal: Negative.  Negative for abdominal distention, abdominal pain, blood in stool, constipation, diarrhea, nausea, rectal pain and vomiting.  Endocrine: Negative.  Negative for hot flashes.  Genitourinary: Negative.  Negative for bladder incontinence, difficulty urinating, dyspareunia, dysuria, frequency, hematuria, menstrual problem, nocturia, pelvic pain, vaginal bleeding and vaginal discharge.   Musculoskeletal:  Positive for arthralgias (Left shoulder) and back pain (Low back). Negative for flank pain, gait problem, myalgias, neck pain and neck stiffness.       Soreness in her right lower ribs  Skin: Negative.  Negative for itching, rash and wound.  Neurological:  Negative for dizziness, extremity weakness, gait problem, headaches, light-headedness, numbness, seizures and speech difficulty.  Hematological:  Negative for adenopathy. Bruises/bleeds easily.  Psychiatric/Behavioral:  Positive for depression and sleep disturbance. Negative for confusion, decreased concentration and suicidal ideas. The patient is nervous/anxious.      VITALS:  Blood pressure 130/79, pulse 78, temperature 98 F (36.7 C), temperature source Oral, resp. rate 16, height 5\' 7"  (1.702 m), weight 234 lb 9.6 oz (106.4 kg), SpO2 93%.  Wt Readings from Last 3 Encounters:  07/28/23 234 lb 9.6 oz (106.4 kg)  06/29/23 237 lb 3.2 oz (107.6 kg)  04/04/23 245 lb (111.1 kg)    Body mass index is 36.74 kg/m.  Performance status (ECOG): 1 - Symptomatic but completely ambulatory  PHYSICAL EXAM:  Physical Exam Vitals and nursing note reviewed.  Constitutional:      General: She is not in acute distress.    Appearance: Normal appearance. She is normal weight. She is not ill-appearing, toxic-appearing or diaphoretic.  HENT:     Head: Normocephalic and atraumatic.     Right Ear: Tympanic membrane, ear canal and  external ear normal. There is no impacted cerumen.     Left Ear: Tympanic membrane, ear canal and external ear normal. There is no impacted cerumen.     Nose: Nose normal. No congestion or rhinorrhea.     Mouth/Throat:     Mouth: Mucous membranes are moist.     Pharynx: Oropharynx is clear. No oropharyngeal exudate or posterior oropharyngeal erythema.  Eyes:     General: No scleral icterus.       Right eye: No discharge.        Left eye: No discharge.     Extraocular Movements: Extraocular movements intact.     Conjunctiva/sclera: Conjunctivae normal.     Pupils: Pupils are equal, round, and reactive to light.  Neck:     Vascular: No carotid bruit.  Cardiovascular:     Rate and Rhythm: Normal rate and regular rhythm.  Pulses: Normal pulses.     Heart sounds: Normal heart sounds. No murmur heard.    No friction rub. No gallop.  Pulmonary:     Effort: Pulmonary effort is normal. No respiratory distress.     Breath sounds: Normal breath sounds. No stridor. No wheezing, rhonchi or rales.  Chest:     Chest wall: No tenderness.  Breasts:    Right: Normal. No swelling, bleeding, inverted nipple, mass, nipple discharge, skin change or tenderness.     Left: Normal. No swelling, bleeding, inverted nipple, mass, nipple discharge, skin change or tenderness.     Comments: Deep scar along the inferior right areolar complex No masses in either breast Abdominal:     General: Bowel sounds are normal. There is no distension.     Palpations: Abdomen is soft. There is no hepatomegaly, splenomegaly or mass.     Tenderness: There is no abdominal tenderness. There is no right CVA tenderness, left CVA tenderness, guarding or rebound.     Hernia: No hernia is present.     Comments: Well healed midline abdominal scar  Musculoskeletal:        General: No swelling, tenderness, deformity or signs of injury. Normal range of motion.     Left hand: No lacerations.     Cervical back: Normal range of motion  and neck supple. No rigidity or tenderness.     Right lower leg: 1+ Edema present.     Left lower leg: 1+ Edema present.     Comments: Red and swollen left wrist but no heat or tenderness  Lymphadenopathy:     Cervical: No cervical adenopathy.     Upper Body:     Right upper body: No supraclavicular or axillary adenopathy.     Left upper body: No supraclavicular or axillary adenopathy.     Lower Body: No right inguinal adenopathy. No left inguinal adenopathy.  Skin:    General: Skin is warm and dry.     Coloration: Skin is not jaundiced or pale.     Findings: No bruising, erythema, lesion or rash.  Neurological:     General: No focal deficit present.     Mental Status: She is alert and oriented to person, place, and time. Mental status is at baseline.     Cranial Nerves: No cranial nerve deficit.     Sensory: No sensory deficit.     Motor: No weakness.     Coordination: Coordination normal.     Gait: Gait normal.     Deep Tendon Reflexes: Reflexes normal.  Psychiatric:        Mood and Affect: Mood normal.        Behavior: Behavior normal.        Thought Content: Thought content normal.        Judgment: Judgment normal.     LABS:      Latest Ref Rng & Units 07/28/2023    1:22 PM 06/29/2023    3:39 PM 01/19/2023   12:53 PM  CBC  WBC 4.0 - 10.5 K/uL 7.1  6.0  5.6   Hemoglobin 12.0 - 15.0 g/dL 40.9  81.1  91.4   Hematocrit 36.0 - 46.0 % 40.6  41.8  38.7   Platelets 150 - 400 K/uL 174  240  303       Latest Ref Rng & Units 07/28/2023    1:22 PM 06/29/2023    3:39 PM 01/19/2023   12:53 PM  CMP  Glucose 70 - 99  mg/dL 161  88  096   BUN 8 - 23 mg/dL 17  13  13    Creatinine 0.44 - 1.00 mg/dL 0.45  4.09  8.11   Sodium 135 - 145 mmol/L 141  145  143   Potassium 3.5 - 5.1 mmol/L 3.5  3.3  3.3   Chloride 98 - 111 mmol/L 103  101  103   CO2 22 - 32 mmol/L 28  28  30    Calcium 8.9 - 10.3 mg/dL 8.4  8.4  8.5   Total Protein 6.5 - 8.1 g/dL 5.6  5.7  6.1   Total Bilirubin 0.0 - 1.2  mg/dL 0.5  0.6  0.5   Alkaline Phos 38 - 126 U/L 100  110  80   AST 15 - 41 U/L 16  12  15    ALT 0 - 44 U/L 8  10  13     No results found for: "CEA1", "CEA" / No results found for: "CEA1", "CEA" No results found for: "PSA1" No results found for: "BJY782" No results found for: "CAN125"  No results found for: "TOTALPROTELP", "ALBUMINELP", "A1GS", "A2GS", "BETS", "BETA2SER", "GAMS", "MSPIKE", "SPEI" Lab Results  Component Value Date   TIBC 195 (L) 07/28/2023   TIBC 305 01/19/2023   TIBC 329 07/13/2022   FERRITIN 69 07/28/2023   FERRITIN 18 01/19/2023   FERRITIN 8 (L) 07/13/2022   IRONPCTSAT 29 07/28/2023   IRONPCTSAT 19 01/19/2023   IRONPCTSAT 9 (L) 07/13/2022   Lab Results  Component Value Date   VITAMINB12 623 06/29/2023   No results found for: "LDH"  STUDIES:  EXAM: 07/25/2023 DIGITAL SCREENING BILATERAL MAMMOGRAM WITH TOMOSYNTHESIS AND CAD IMPRESSION: No mammographic evidence of malignancy.    HISTORY:   Past Medical History:  Diagnosis Date   A-fib (HCC) 09/08/2015   Atrial fibrillation (HCC) 2016   Sees Dr. Hillis Range. Cardiac ablation, loop recorder (removed in 2022)   Atrial fibrillation with rapid ventricular response (HCC) 04/2015   Breast cancer (HCC)    right lumpectomy   BRONCHITIS 03/10/2009   Qualifier: Diagnosis of  By: Alto Denver MD, Grayland Ormond    Chest pain with moderate risk for cardiac etiology 04/28/2015   Dehydration 06/18/2009   Qualifier: Diagnosis of  By: Mike Gip     Depression    Fall 07/20/2021   Family history of breast cancer    Family history of colon cancer    Fibromyalgia 2010   Hyperlipidemia    Hypothyroidism (acquired) 2006   s/p thyroidectomy for nodules   Morbid obesity (HCC) 08/10/2015   Obesity    gastric bpg 2011, weight then 400 lbs, lost 100 lbs in 60 days.   Obstructive sleep apnea    does not use cpap, does not have a following physician   Paroxysmal atrial fibrillation (HCC) 08/10/2015    Past Surgical  History:  Procedure Laterality Date   ABDOMINAL HYSTERECTOMY  1992   total.    APPENDECTOMY     BREAST BIOPSY Left 08/04/2022   MM LT BREAST BX W LOC DEV 1ST LESION IMAGE BX SPEC STEREO GUIDE 08/04/2022 [facility]   BREAST BIOPSY Left 08/12/2022   MM LT BREAST BX W LOC DEV 1ST LESION IMAGE BX SPEC STEREO GUIDE 08/12/2022 [facility]   BREAST SURGERY  2019   right lumpectomy    CHOLECYSTECTOMY     COLONOSCOPY     ELECTROPHYSIOLOGIC STUDY N/A 09/08/2015   Procedure: Atrial Fibrillation Ablation;  Surgeon: Hillis Range, MD;  Location:  [facility];  Service: Cardiovascular;  Laterality: N/A;   EP IMPLANTABLE DEVICE N/A 12/17/2015   Procedure: Loop Recorder Insertion;  Surgeon: Hillis Range, MD;  Location: [facility];  Service: Cardiovascular;  Laterality: N/A;   ESOPHAGOGASTRODUODENOSCOPY     EXPLORATORY LAPAROTOMY WITH ABDOMINAL MASS EXCISION  2018   GASTRIC BYPASS  2011   lost 100 lbs, but gained some back.   implantable loop recorder removal  03/25/2020   removed 2022   NECK SURGERY  2005   laminectomy?   THYROIDECTOMY  2006   for nodules ( hot and cold nodules) Goiter compressing trachea.    Family History  Problem Relation Age of Onset   Heart failure Father    Cancer Father        colon   Heart failure Maternal Grandmother    Heart failure Maternal Grandfather    Cancer Mother        breast    Social History:  reports that she has never smoked. She has never used smokeless tobacco. She reports current alcohol use of about 1.0 standard drink of alcohol per week. She reports that she does not use drugs.The patient is alone today.  Allergies:  Allergies  Allergen Reactions   Latex Itching and Rash   Nitroglycerin     Hypotension, difficulty maintaining conciousness Hypotension, difficulty maintaining conciousness Hypotension, difficulty maintaining conciousness   Wound Dressings Dermatitis, Itching and Rash    Current Medications: Current  Outpatient Medications  Medication Sig Dispense Refill   buPROPion (WELLBUTRIN XL) 150 MG 24 hr tablet Take 450 mg by mouth daily.     buPROPion (WELLBUTRIN XL) 300 MG 24 hr tablet Take 1 tablet (300 mg total) by mouth daily. 90 tablet 0   diltiazem (CARDIZEM CD) 120 MG 24 hr capsule TAKE ONE CAPSULE BY MOUTH DAILY 90 capsule 0   ELIQUIS 5 MG TABS tablet TAKE ONE TABLET BY MOUTH TWICE DAILY 60 tablet 2   Evolocumab (REPATHA SURECLICK) 140 MG/ML SOAJ Inject 140 mg into the skin every 14 (fourteen) days. 2 mL 12   furosemide (LASIX) 20 MG tablet Take 1 tablet (20 mg total) by mouth daily as needed for edema. 30 tablet 2   gabapentin (NEURONTIN) 600 MG tablet Take 1 tablet (600 mg total) by mouth 3 (three) times daily. (Patient taking differently: Take 600 mg by mouth 4 (four) times daily.) 270 tablet 1   letrozole (FEMARA) 2.5 MG tablet TAKE ONE TABLET BY MOUTH EVERY DAY 90 tablet 3   levothyroxine (SYNTHROID) 75 MCG tablet TAKE ONE TABLET BY MOUTH DAILY 90 tablet 1   metoprolol tartrate (LOPRESSOR) 25 MG tablet Take 1 tablet (25 mg total) by mouth daily as needed (PALPITATION/ AFIB). 90 tablet 0   omeprazole (PRILOSEC) 40 MG capsule Take 1 capsule (40 mg total) by mouth in the morning and at bedtime. 180 capsule 3   potassium chloride SA (KLOR-CON M) 20 MEQ tablet Take 1 tablet (20 mEq total) by mouth daily. 30 tablet 3   Zoledronic Acid (RECLAST IV) Inject into the vein.     No current facility-administered medications for this visit.    I,Jasmine M Lassiter,acting as a scribe for Dellia Beckwith, MD.,have documented all relevant documentation on the behalf of Dellia Beckwith, MD,as directed by  Dellia Beckwith, MD while in the presence of Dellia Beckwith, MD.

## 2023-08-09 ENCOUNTER — Other Ambulatory Visit: Payer: Self-pay | Admitting: Family Medicine

## 2023-08-09 ENCOUNTER — Encounter: Payer: Self-pay | Admitting: Hematology and Oncology

## 2023-08-23 ENCOUNTER — Ambulatory Visit: Payer: Self-pay | Admitting: *Deleted

## 2023-08-23 NOTE — Telephone Encounter (Signed)
 Copied from CRM [id number]. Topic: Clinical - Red Word Triage >> Aug 23, 2023  1:46 PM Elle L wrote: Red Word that prompted transfer to Nurse Triage: The patient is having difficulty doing anything in the morning due to pain. She is in pain currently in her lower back. Reason for Disposition  [1] MODERATE back pain (e.g., interferes with normal activities) AND [2] present > 3 days  Answer Assessment - Initial Assessment Questions 1. ONSET: "When did the pain begin?"      I had an implant put in my back for chronic pain.   I've been back to the pain clinic.   It won't hel with the arthritis.   When I get up in the mornings I can't stand up straight due to the pain.   I need a stronger pain reliever for in the mornings.   It takes about 4 hours for me to be able to stand up straight. 2. LOCATION: "Where does it hurt?" (upper, mid or lower back)     My lower back 3. SEVERITY: "How bad is the pain?"  (e.g., Scale 1-10; mild, moderate, or severe)   - MILD (1-3): Doesn't interfere with normal activities.    - MODERATE (4-7): Interferes with normal activities or awakens from sleep.    - SEVERE (8-10): Excruciating pain, unable to do any normal activities.      The pain is intense in the mornings. 4. PATTERN: "Is the pain constant?" (e.g., yes, no; constant, intermittent)      Yes in the mornings.  5. RADIATION: "Does the pain shoot into your legs or somewhere else?"     No just in my lower back. 6. CAUSE:  "What do you think is causing the back pain?"      arthritis 7. BACK OVERUSE:  "Any recent lifting of heavy objects, strenuous work or exercise?"     No 8. MEDICINES: "What have you taken so far for the pain?" (e.g., nothing, acetaminophen, NSAIDS)     OTC medications will not help 9. NEUROLOGIC SYMPTOMS: "Do you have any weakness, numbness, or problems with bowel/bladder control?"     I have an implant in for nerve pain. 10. OTHER SYMPTOMS: "Do you have any other symptoms?" (e.g., fever, abdomen  pain, burning with urination, blood in urine)       No 11. PREGNANCY: "Is there any chance you are pregnant?" "When was your last menstrual period?"       N/A due to age  Protocols used: Back Pain-A-AH  Chief Complaint: Severe lower back pain in the mornings from arthritis.   It takes about 4 hours before she can stand up straight.   Needing a stronger pain medication than OTC.    Has a spinal implant in for chronic nerve pain but it does not help with the arthritis pain per the pain clinic. Symptoms: above Frequency: Every morning Pertinent Negatives: Patient denies OTC medications helping. Disposition: [] ED /[] Urgent Care (no appt availability in office) / [x] Appointment(In office/virtual)/ []  [facility]/ [] Home Care/ [] Refused Recommended Disposition /[] [place] [facility]/ []  Follow-up with PCP Additional Notes: Appt made for tomorrow at pt's request.   08/24/2023 with Lajuana Matte.

## 2023-08-24 ENCOUNTER — Ambulatory Visit (INDEPENDENT_AMBULATORY_CARE_PROVIDER_SITE_OTHER): Admitting: Family Medicine

## 2023-08-24 ENCOUNTER — Encounter: Payer: Self-pay | Admitting: Family Medicine

## 2023-08-24 VITALS — BP 100/62 | HR 85 | Temp 97.6°F | Resp 16 | Ht 67.0 in | Wt 226.0 lb

## 2023-08-24 DIAGNOSIS — K219 Gastro-esophageal reflux disease without esophagitis: Secondary | ICD-10-CM | POA: Diagnosis not present

## 2023-08-24 DIAGNOSIS — M545 Low back pain, unspecified: Secondary | ICD-10-CM | POA: Diagnosis not present

## 2023-08-24 MED ORDER — VOQUEZNA 20 MG PO TABS: 20.0000 mg | ORAL_TABLET | Freq: Every day | ORAL | 0 refills | Status: DC

## 2023-08-24 MED ORDER — OMEPRAZOLE 40 MG PO CPDR
40.0000 mg | DELAYED_RELEASE_CAPSULE | Freq: Two times a day (BID) | ORAL | 3 refills | Status: AC
Start: 2023-08-24 — End: ?

## 2023-08-24 MED ORDER — HYDROCODONE-ACETAMINOPHEN 10-325 MG PO TABS: 1.0000 | ORAL_TABLET | Freq: Three times a day (TID) | ORAL | 0 refills | Status: AC | PRN

## 2023-08-24 NOTE — Progress Notes (Signed)
 Acute Office Visit  Subjective:    Patient ID: Monique Kelly, female    DOB: [birth year], 68 y.o.   MRN: [id number]  Chief Complaint  Patient presents with   Back Pain   Emesis    Discussed the use of AI scribe software for clinical note transcription with the patient, who gave verbal consent to proceed.  HPI: Monique Kelly "Luster Landsberg" is a 68 year old female with chronic low back pain who presents with worsening symptoms.  Patient is in today for lower back pain. Patient states she history of degenerative disc disease, spinal stenosis, arthritis, scoliosis and bone spurs. Patient states she has artificial disc in her neck. Patient states the back pain in her lower back has been persistent for +6 months.  She experiences chronic low back pain, attributed to arthritis, which began after a spinal implant in June of the previous year. Initially, the implant provided relief, allowing her to stand up straight and perform activities she couldn't do before. However, its effectiveness has diminished over time, and she now experiences significant pain, especially in the mornings. She needs to use a heating pad for four to five hours before she can shower and dress. The pain is described as a 'belt of pain' across the lumbar area, sometimes radiating around her body, but not in her legs, possibly due to the nerve stimulator. She has a history of falls and had a previous injection in her left shoulder about a year ago due to loss of arm control, attributed to arthritis.  She has a history of taking tramadol for several years, but it became ineffective, leading her to opt for the implant to avoid pain medication. She has tried various over-the-counter anti-inflammatories without relief and cannot take Celebrex due to stomach issues and her history of atrial fibrillation, for which she takes a heart pill and Eliquis. She takes a significant number of medications daily, including Eliquis, and sometimes  forgets to take it at night. She is frustrated with her current situation, including the financial burden of copays for pain clinic appointments and the desire to reduce her medication load.  She experiences chest pain and shortness of breath, and she recently ran out of her reflux medication, which has been sent to the pharmacy. She has a sour stomach, possibly exacerbated by running out of omeprazole.  She is concerned about her nutrition, reporting low protein, iron, and potassium levels, and struggles with food insecurity, relying on microwave meals and food stamps, which are about to be cut off. She lives on social security, receives food stamps, and has difficulty affording copays for medical appointments.  Past Medical History:  Diagnosis Date   A-fib (HCC) 09/08/2015   Atrial fibrillation (HCC) 2016   Sees Dr. Hillis Range. Cardiac ablation, loop recorder (removed in 2022)   Atrial fibrillation with rapid ventricular response (HCC) 04/2015   Breast cancer (HCC)    right lumpectomy   BRONCHITIS 03/10/2009   Qualifier: Diagnosis of  By: Alto Denver MD, Grayland Ormond    Chest pain with moderate risk for cardiac etiology 04/28/2015   Dehydration 06/18/2009   Qualifier: Diagnosis of  By: Mike Gip     Depression    Fall 07/20/2021   Family history of breast cancer    Family history of colon cancer    Fibromyalgia 2010   Hyperlipidemia    Hypothyroidism (acquired) 2006   s/p thyroidectomy for nodules   Morbid obesity (HCC) 08/10/2015   Obesity  gastric bpg 2011, weight then 400 lbs, lost 100 lbs in 60 days.   Obstructive sleep apnea    does not use cpap, does not have a following physician   Paroxysmal atrial fibrillation (HCC) 08/10/2015    Past Surgical History:  Procedure Laterality Date   ABDOMINAL HYSTERECTOMY  1992   total.    APPENDECTOMY     BREAST BIOPSY Left 08/04/2022   MM LT BREAST BX W LOC DEV 1ST LESION IMAGE BX SPEC STEREO GUIDE 08/04/2022 [facility]    BREAST BIOPSY Left 08/12/2022   MM LT BREAST BX W LOC DEV 1ST LESION IMAGE BX SPEC STEREO GUIDE 08/12/2022 [facility]   BREAST SURGERY  2019   right lumpectomy    CHOLECYSTECTOMY     COLONOSCOPY     ELECTROPHYSIOLOGIC STUDY N/A 09/08/2015   Procedure: Atrial Fibrillation Ablation;  Surgeon: Hillis Range, MD;  Location[facility] INVASIVE CV LAB;  Service: Cardiovascular;  Laterality: N/A;   EP IMPLANTABLE DEVICE N/A 12/17/2015   Procedure: Loop Recorder Insertion;  Surgeon: Hillis Range, MD;  Location: [facility] INVASIVE CV LAB;  Service: Cardiovascular;  Laterality: N/A;   ESOPHAGOGASTRODUODENOSCOPY     EXPLORATORY LAPAROTOMY WITH ABDOMINAL MASS EXCISION  2018   GASTRIC BYPASS  2011   lost 100 lbs, but gained some back.   implantable loop recorder removal  03/25/2020   removed 2022   NECK SURGERY  2005   laminectomy?   THYROIDECTOMY  2006   for nodules ( hot and cold nodules) Goiter compressing trachea.    Family History  Problem Relation Age of Onset   Heart failure Father    Cancer Father        colon   Heart failure Maternal Grandmother    Heart failure Maternal Grandfather    Cancer Mother        breast    Social History   Socioeconomic History   Marital status: Single    Spouse name: Not on file   Number of children: Not on file   Years of education: Not on file   Highest education level: Bachelor's degree (e.g., BA, AB, BS)  Occupational History   Occupation: Economist  Tobacco Use   Smoking status: Never   Smokeless tobacco: Never  Vaping Use   Vaping status: Never Used  Substance and Sexual Activity   Alcohol use: Yes    Alcohol/week: 1.0 standard drink of alcohol    Types: 1 Standard drinks or equivalent per week    Comment: rare glass of wine   Drug use: No   Sexual activity: Not Currently  Other Topics Concern   Not on file  Social History Narrative   Pt lives in [place] alone. Works as Counsellor for [employer]   Right  handed   Social Drivers of Health   Financial Resource Strain: Medium Risk (06/27/2023)   Overall Financial Resource Strain (CARDIA)    Difficulty of Paying Living Expenses: Somewhat hard  Food Insecurity: No Food Insecurity (06/27/2023)   Hunger Vital Sign    Worried About Running Out of Food in the Last Year: Never true    Ran Out of Food in the Last Year: Never true  Transportation Needs: No Transportation Needs (06/27/2023)   PRAPARE - Administrator, Civil Service (Medical): No    Lack of Transportation (Non-Medical): No  Physical Activity: Inactive (06/27/2023)   Exercise Vital Sign    Days of Exercise per Week: 0 days  Minutes of Exercise per Session: 0 min  Stress: No Stress Concern Present (06/27/2023)   [employer] of Occupational Health - Occupational Stress Questionnaire    Feeling of Stress : Only a little  Social Connections: Socially Isolated (06/27/2023)   Social Connection and Isolation Panel [NHANES]    Frequency of Communication with Friends and Family: More than three times a week    Frequency of Social Gatherings with Friends and Family: Twice a week    Attends Religious Services: Never    Database administrator or Organizations: No    Attends Banker Meetings: Never    Marital Status: Never married  Intimate Partner Violence: Not At Risk (04/04/2023)   Humiliation, Afraid, Rape, and Kick questionnaire    Fear of Current or Ex-Partner: No    Emotionally Abused: No    Physically Abused: No    Sexually Abused: No    Outpatient Medications Prior to Visit  Medication Sig Dispense Refill   buPROPion (WELLBUTRIN XL) 150 MG 24 hr tablet Take 450 mg by mouth daily.     buPROPion (WELLBUTRIN XL) 300 MG 24 hr tablet Take 1 tablet (300 mg total) by mouth daily. 90 tablet 0   diltiazem (CARDIZEM CD) 120 MG 24 hr capsule TAKE ONE CAPSULE BY MOUTH DAILY 90 capsule 0   ELIQUIS 5 MG TABS tablet TAKE ONE TABLET BY MOUTH TWICE DAILY 60 tablet 2    Evolocumab (REPATHA SURECLICK) 140 MG/ML SOAJ Inject 140 mg into the skin every 14 (fourteen) days. 2 mL 12   furosemide (LASIX) 20 MG tablet Take 1 tablet (20 mg total) by mouth daily as needed for edema. 30 tablet 2   gabapentin (NEURONTIN) 600 MG tablet Take 1 tablet (600 mg total) by mouth 3 (three) times daily. (Patient taking differently: Take 600 mg by mouth 4 (four) times daily.) 270 tablet 1   letrozole (FEMARA) 2.5 MG tablet TAKE ONE TABLET BY MOUTH EVERY DAY 90 tablet 3   levothyroxine (SYNTHROID) 75 MCG tablet TAKE ONE TABLET BY MOUTH DAILY 90 tablet 1   metoprolol tartrate (LOPRESSOR) 25 MG tablet Take 1 tablet (25 mg total) by mouth daily as needed (PALPITATION/ AFIB). 90 tablet 0   potassium chloride SA (KLOR-CON M) 20 MEQ tablet Take 1 tablet (20 mEq total) by mouth daily. 30 tablet 3   Zoledronic Acid (RECLAST IV) Inject into the vein.     omeprazole (PRILOSEC) 40 MG capsule Take 1 capsule (40 mg total) by mouth in the morning and at bedtime. 180 capsule 3   No facility-administered medications prior to visit.    Allergies  Allergen Reactions   Latex Itching and Rash   Nitroglycerin     Hypotension, difficulty maintaining conciousness Hypotension, difficulty maintaining conciousness Hypotension, difficulty maintaining conciousness   Wound Dressings Dermatitis, Itching and Rash    Review of Systems  Constitutional:  Negative for chills, diaphoresis, fatigue and fever.  HENT:  Negative for congestion, ear pain and sinus pain.   Eyes: Negative.   Respiratory:  Negative for cough and shortness of breath.   Cardiovascular:  Negative for chest pain.  Gastrointestinal:  Negative for abdominal pain, constipation, nausea and vomiting.  Endocrine: Negative.   Genitourinary:  Negative for dysuria, frequency and urgency.  Musculoskeletal:  Positive for back pain. Negative for arthralgias.  Skin: Negative.   Allergic/Immunologic: Negative.   Neurological:  Negative for weakness  and headaches.  Hematological: Negative.   Psychiatric/Behavioral:  Negative for dysphoric mood. The patient  is not nervous/anxious.        Objective:        08/24/2023    1:21 PM 07/28/2023    1:47 PM 06/29/2023    1:54 PM  Vitals with BMI  Height 5\' 7"  5\' 7"  5\' 7"   Weight 226 lbs 234 lbs 10 oz 237 lbs 3 oz  BMI 35.39 36.73 37.14  Systolic 100 130 88  Diastolic 62 79 52  Pulse 85 78 82    No data found.   Physical Exam Constitutional:      General: She is not in acute distress.    Appearance: Normal appearance.  Eyes:     Conjunctiva/sclera: Conjunctivae normal.  Cardiovascular:     Rate and Rhythm: Normal rate and regular rhythm.     Heart sounds: Normal heart sounds.  Pulmonary:     Effort: Pulmonary effort is normal.     Breath sounds: Normal breath sounds. No wheezing.  Musculoskeletal:     Lumbar back: Tenderness present. Decreased range of motion. Scoliosis present.  Skin:    General: Skin is warm.  Neurological:     Mental Status: She is alert. Mental status is at baseline.  Psychiatric:        Mood and Affect: Mood normal.        Behavior: Behavior normal.     Health Maintenance Due  Topic Date Due   DTaP/Tdap/Td (1 - Tdap) Never done   Zoster Vaccines- Shingrix (1 of 2) Never done    There are no preventive care reminders to display for this patient.   Lab Results  Component Value Date   TSH 1.380 08/11/2022   Lab Results  Component Value Date   WBC 7.1 07/28/2023   HGB 13.2 07/28/2023   HCT 40.6 07/28/2023   MCV 101.0 (H) 07/28/2023   PLT 174 07/28/2023   Lab Results  Component Value Date   NA 141 07/28/2023   K 3.5 07/28/2023   CO2 28 07/28/2023   GLUCOSE 104 (H) 07/28/2023   BUN 17 07/28/2023   CREATININE 1.11 (H) 07/28/2023   BILITOT 0.5 07/28/2023   ALKPHOS 100 07/28/2023   AST 16 07/28/2023   ALT 8 07/28/2023   PROT 5.6 (L) 07/28/2023   ALBUMIN 3.1 (L) 07/28/2023   CALCIUM 8.4 (L) 07/28/2023   ANIONGAP 10 07/28/2023    EGFR 55 (L) 06/29/2023   Lab Results  Component Value Date   CHOL 126 06/29/2023   Lab Results  Component Value Date   HDL 59 06/29/2023   Lab Results  Component Value Date   LDLCALC 49 06/29/2023   Lab Results  Component Value Date   TRIG 97 06/29/2023   Lab Results  Component Value Date   CHOLHDL 2.1 06/29/2023   Lab Results  Component Value Date   HGBA1C 5.4 06/29/2023       Assessment & Plan:  Lumbar back pain Assessment & Plan: Chronic low back pain likely due to arthritis, exacerbated by previous falls, not fully managed by spinal stimulator. Severe pain affecting daily activities, unrelieved by NSAIDs or tramadol. Cautious about pain medications due to family history of addiction. - Order lumbar spine imaging. - Refer to orthopedics for possible steroid injections. - Prescribe short-term hydrocodone. - Discuss NSAID risks due to blood thinner use.  Orders: -     Omeprazole; Take 1 capsule (40 mg total) by mouth in the morning and at bedtime.  Dispense: 180 capsule; Refill: 3 -     DG  Lumbar Spine Complete; Future -     Ambulatory referral to Orthopedics -     HYDROcodone-Acetaminophen; Take 1-2 tablets by mouth every 8 (eight) hours as needed for up to 5 days.  Dispense: 15 tablet; Refill: 0  Gastroesophageal reflux disease without esophagitis Assessment & Plan: Recurrent GERD symptoms with significant discomfort. Considered Voquezna for longer-lasting effect and better symptom control - Provide Voquezna 10 mg samples - Advise against concurrent use with omeprazole. - Send omeprazole prescription to [facility].   Orders: -     Omeprazole; Take 1 capsule (40 mg total) by mouth in the morning and at bedtime.  Dispense: 180 capsule; Refill: 3 -     Voquezna; Take 20 mg by mouth daily.  Dispense: 30 tablet; Refill: 0     Meds ordered this encounter  Medications   omeprazole (PRILOSEC) 40 MG capsule    Sig: Take 1 capsule (40 mg total) by mouth in the morning  and at bedtime.    Dispense:  180 capsule    Refill:  3   Vonoprazan Fumarate (VOQUEZNA) 20 MG TABS    Sig: Take 20 mg by mouth daily.    Dispense:  30 tablet    Refill:  0   HYDROcodone-acetaminophen (NORCO) 10-325 MG tablet    Sig: Take 1-2 tablets by mouth every 8 (eight) hours as needed for up to 5 days.    Dispense:  15 tablet    Refill:  0    Orders Placed This Encounter  Procedures   DG Lumbar Spine Complete   AMB referral to orthopedics     Follow-up: Return in about 2 months (around 10/24/2023) for chronic (Dr. Sedalia Muta).  An After Visit Summary was printed and given to the patient.  Total time spent on today's visit was 35 minutes, including both face-to-face time and nonface-to-face time personally spent on review of chart (labs and imaging), discussing labs and goals, discussing further work-up, treatment options, referrals to specialist if needed, reviewing outside records if pertinent, answering patient's questions, and coordinating care.    Lajuana Matte, FNP [facility] [phone]

## 2023-08-24 NOTE — Assessment & Plan Note (Signed)
 Recurrent GERD symptoms with significant discomfort. Considered Voquezna for longer-lasting effect and better symptom control - Provide Voquezna 10 mg samples - Advise against concurrent use with omeprazole. - Send omeprazole prescription to [facility].

## 2023-08-24 NOTE — Assessment & Plan Note (Signed)
 Chronic low back pain likely due to arthritis, exacerbated by previous falls, not fully managed by spinal stimulator. Severe pain affecting daily activities, unrelieved by NSAIDs or tramadol. Cautious about pain medications due to family history of addiction. - Order lumbar spine imaging. - Refer to orthopedics for possible steroid injections. - Prescribe short-term hydrocodone. - Discuss NSAID risks due to blood thinner use.

## 2023-08-24 NOTE — Patient Instructions (Signed)
 Managing Pain Without Opioids Opioids are strong medicines used to treat moderate to severe pain. For some people, especially those who have long-term (chronic) pain, opioids may not be the best choice for pain management due to: Side effects like nausea, constipation, and sleepiness. The risk of addiction (opioid use disorder). The longer you take opioids, the greater your risk of addiction. Pain that lasts for more than 3 months is called chronic pain. Managing chronic pain usually requires more than one approach and is often provided by a team of health care providers working together (multidisciplinary approach). Pain management may be done at a pain management center or pain clinic. How to manage pain without the use of opioids Use non-opioid medicines Non-opioid medicines for pain may include: Over-the-counter or prescription non-steroidal anti-inflammatory drugs (NSAIDs). These may be the first medicines used for pain. They work well for muscle and bone pain, and they reduce swelling. Acetaminophen. This over-the-counter medicine may work well for milder pain but not swelling. Antidepressants. These may be used to treat chronic pain. A certain type of antidepressant (tricyclics) is often used. These medicines are given in lower doses for pain than when used for depression. Anticonvulsants. These are usually used to treat seizures but may also reduce nerve (neuropathic) pain. Muscle relaxants. These relieve pain caused by sudden muscle tightening (spasms). You may also use a pain medicine that is applied to the skin as a patch, cream, or gel (topical analgesic), such as a numbing medicine. These may cause fewer side effects than medicines taken by mouth. Do certain therapies as directed Some therapies can help with pain management. They include: Physical therapy. You will do exercises to gain strength and flexibility. A physical therapist may teach you exercises to move and stretch parts of  your body that are weak, stiff, or painful. You can learn these exercises at physical therapy visits and practice them at home. Physical therapy may also involve: Massage. Heat wraps or applying heat or cold to affected areas. Electrical signals that interrupt pain signals (transcutaneous electrical nerve stimulation, TENS). Weak lasers that reduce pain and swelling (low-level laser therapy). Signals from your body that help you learn to regulate pain (biofeedback). Occupational therapy. This helps you to learn ways to function at home and work with less pain. Recreational therapy. This involves trying new activities or hobbies, such as a physical activity or drawing. Mental health therapy, including: Cognitive behavioral therapy (CBT). This helps you learn coping skills for dealing with pain. Acceptance and commitment therapy (ACT) to change the way you think and react to pain. Relaxation therapies, including muscle relaxation exercises and mindfulness-based stress reduction. Pain management counseling. This may be individual, family, or group counseling.  Receive medical treatments Medical treatments for pain management include: Nerve block injections. These may include a pain blocker and anti-inflammatory medicines. You may have injections: Near the spine to relieve chronic back or neck pain. Into joints to relieve back or joint pain. Into nerve areas that supply a painful area to relieve body pain. Into muscles (trigger point injections) to relieve some painful muscle conditions. A medical device placed near your spine to help block pain signals and relieve nerve pain or chronic back pain (spinal cord stimulation device). Acupuncture. Follow these instructions at home Medicines Take over-the-counter and prescription medicines only as told by your health care provider. If you are taking pain medicine, ask your health care providers about possible side effects to watch out for. Do not  drive or use heavy machinery  while taking prescription opioid pain medicine. Lifestyle  Do not use drugs or alcohol to reduce pain. If you drink alcohol, limit how much you have to: 0-1 drink a day for women who are not pregnant. 0-2 drinks a day for men. Know how much alcohol is in a drink. In the [place] one drink equals one 12 oz bottle of beer (355 mL), one 5 oz glass of wine (148 mL), or one 1 oz glass of hard liquor (44 mL). Do not use any products that contain nicotine or tobacco. These products include cigarettes, chewing tobacco, and vaping devices, such as e-cigarettes. If you need help quitting, ask your health care provider. Eat a healthy diet and maintain a healthy weight. Poor diet and excess weight may make pain worse. Eat foods that are high in fiber. These include fresh fruits and vegetables, whole grains, and beans. Limit foods that are high in fat and processed sugars, such as fried and sweet foods. Exercise regularly. Exercise lowers stress and may help relieve pain. Ask your health care provider what activities and exercises are safe for you. If your health care provider approves, join an exercise class that combines movement and stress reduction. Examples include yoga and tai chi. Get enough sleep. Lack of sleep may make pain worse. Lower stress as much as possible. Practice stress reduction techniques as told by your therapist. General instructions Work with all your pain management providers to find the treatments that work best for you. You are an important member of your pain management team. There are many things you can do to reduce pain on your own. Consider joining an online or in-person support group for people who have chronic pain. Keep all follow-up visits. This is important. Where to find more information You can find more information about managing pain without opioids from: American Academy of Pain Medicine: painmed.org Institute for Chronic Pain:  instituteforchronicpain.org American Chronic Pain Association: theacpa.org Contact a health care provider if: You have side effects from pain medicine. Your pain gets worse or does not get better with treatments or home therapy. You are struggling with anxiety or depression. Summary Many types of pain can be managed without opioids. Chronic pain may respond better to pain management without opioids. Pain is best managed when you and a team of health care providers work together. Pain management without opioids may include non-opioid medicines, medical treatments, physical therapy, mental health therapy, and lifestyle changes. Tell your health care providers if your pain gets worse or is not being managed well enough. This information is not intended to replace advice given to you by your health care provider. Make sure you discuss any questions you have with your health care provider. Document Revised: 08/19/2020 Document Reviewed: 08/19/2020 Elsevier Patient Education  2024 [employer].

## 2023-08-26 ENCOUNTER — Other Ambulatory Visit: Payer: Self-pay | Admitting: Family Medicine

## 2023-08-27 ENCOUNTER — Other Ambulatory Visit: Payer: Self-pay | Admitting: Oncology

## 2023-08-27 DIAGNOSIS — C50411 Malignant neoplasm of upper-outer quadrant of right female breast: Secondary | ICD-10-CM

## 2023-08-30 ENCOUNTER — Other Ambulatory Visit: Payer: Self-pay | Admitting: Family Medicine

## 2023-08-30 MED ORDER — GABAPENTIN 600 MG PO TABS: 600.0000 mg | ORAL_TABLET | Freq: Four times a day (QID) | ORAL | 1 refills | Status: DC

## 2023-08-30 NOTE — Telephone Encounter (Signed)
 Copied from CRM [id number]. Topic: Clinical - Medication Refill >> Aug 30, 2023  9:49 AM Emylou G wrote: Most Recent Primary Care Visit:  Provider: Renne Crigler  Department: [facility]  Visit Type: ACUTE  Date: 08/24/2023  Medication:  HYDROcodone-acetaminophen [facility]) 10-325   Has the patient contacted their pharmacy? Yes (Agent: If no, request that the patient contact the pharmacy for the refill. If patient does not wish to contact the pharmacy document the reason why and proceed with request.) (Agent: If yes, when and what did the pharmacy advise?) said to contact us  Is this the correct pharmacy for this prescription? Yes If no, delete pharmacy and type the correct one.  This is the patient's preferred pharmacy:    [facility] [address] [place] - [address] [facility] [place] [address] Phone: [phone] Fax: [phone]    Has the prescription been filled recently? No  Is the patient out of the medication? Yes  Has the patient been seen for an appointment in the last year OR does the patient have an upcoming appointment? Yes  Can we respond through MyChart? Yes  Agent: Please be advised that Rx refills may take up to 3 business days. We ask that you follow-up with your pharmacy.

## 2023-09-04 ENCOUNTER — Ambulatory Visit: Payer: PPO | Admitting: Cardiology

## 2023-09-04 DIAGNOSIS — I480 Paroxysmal atrial fibrillation: Secondary | ICD-10-CM

## 2023-09-08 ENCOUNTER — Other Ambulatory Visit: Payer: Self-pay | Admitting: Family Medicine

## 2023-09-14 ENCOUNTER — Ambulatory Visit: Admitting: Sports Medicine

## 2023-09-25 ENCOUNTER — Encounter: Payer: Self-pay | Admitting: Sports Medicine

## 2023-09-25 ENCOUNTER — Other Ambulatory Visit (INDEPENDENT_AMBULATORY_CARE_PROVIDER_SITE_OTHER): Payer: Self-pay

## 2023-09-25 ENCOUNTER — Ambulatory Visit: Admitting: Sports Medicine

## 2023-09-25 DIAGNOSIS — M5136 Other intervertebral disc degeneration, lumbar region with discogenic back pain only: Secondary | ICD-10-CM | POA: Diagnosis not present

## 2023-09-25 DIAGNOSIS — M47816 Spondylosis without myelopathy or radiculopathy, lumbar region: Secondary | ICD-10-CM

## 2023-09-25 DIAGNOSIS — M797 Fibromyalgia: Secondary | ICD-10-CM

## 2023-09-25 DIAGNOSIS — G8929 Other chronic pain: Secondary | ICD-10-CM

## 2023-09-25 DIAGNOSIS — M5441 Lumbago with sciatica, right side: Secondary | ICD-10-CM

## 2023-09-25 DIAGNOSIS — Z9689 Presence of other specified functional implants: Secondary | ICD-10-CM

## 2023-09-25 DIAGNOSIS — M5442 Lumbago with sciatica, left side: Secondary | ICD-10-CM

## 2023-09-25 MED ORDER — CYCLOBENZAPRINE HCL 5 MG PO TABS
5.0000 mg | ORAL_TABLET | Freq: Every day | ORAL | 0 refills | Status: DC
Start: 2023-09-25 — End: 2024-01-01

## 2023-09-25 NOTE — Progress Notes (Signed)
 Patient says that she has low back pain which she describes as a belt across her low back. She says that it is at its worst when she gets up in the morning, and when she is going to bed at night. She says that she has to sit on a heating pad for 4-5 hours in the morning before she is able to get up and move around. She does have an implant for chronic pain, but was advised that injections may help her pain. She has pain, numbness, and tingling in the front of her lower leg between her knee and her ankle.

## 2023-09-25 NOTE — Progress Notes (Addendum)
 Monique Kelly - 68 y.o. female MRN [id number]  Date of birth: [birth year]  Office Visit Note: Visit Date: 09/25/2023 PCP: Mercy Stall, MD Referred by: Mercy Stall, MD  Subjective: Chief Complaint  Patient presents with   Lower Back - Pain   HPI: Monique Kelly is a pleasant 68 y.o. female who presents today for acute on chronic bilateral low back pain in the setting of lumbar DDD/arthritis as well as status post spinal cord stimulator.  She follows with Dr. Lydia Sams at the pain clinic in [facility] and did have a spinal cord stimulator placed earlier.  Spinal cord stimulator implanted on 09/08/2022 [facility]).  She has chronic low back pain with intermittent radicular symptoms down both legs. The radicular symptoms have improved with her spinal cord stimulator, although she still has pain that persist in the low back.  This is worse with extension.  Her back pain is worse at nighttime/laying down.  She was previously on Norco through Dr. Basilio Both office but tried to wean off of this after her spinal cord stimulator.  She also uses Aleve  as needed even though she knows she should not take this given her concomitant Eliquis  use.  She has had several injections in the spine in the past, has seen Dr. Ramo's for this.  She has not had injections recently and would like to proceed with this for some pain relief in the back.  She has completed several sessions of formalized physical therapy in the past with only some relief. Currently, PT is cost-prohibitive.  She does have a history of fibromyalgia per her report.  Pertinent ROS were reviewed with the patient and found to be negative unless otherwise specified above in HPI.   Assessment & Plan: Visit Diagnoses:  1. Chronic bilateral low back pain with bilateral sciatica   2. Degeneration of intervertebral disc of lumbar region with discogenic back pain   3. Facet arthritis of lumbar region   4. Fibromyalgia   5. S/P insertion of  spinal cord stimulator    Plan: Leighton Punches has a complicated back history with acute on chronic low back pain in the setting of previous spinal cord relator on 09/08/2022.  This has helped some of her radicular symptoms but she still has pain in the low back.  She has had injections in the past with some relief and is interested in repeating those.  I did discuss with her that she has multiple pathology in the low back including advanced degenerative disc disease of the lumbar spine as well as facet arthritis.  She also has fibromyalgia which is likely contributing to her back pain as well.  Given her pain with extension, I do think she would benefit from facet injections, this is most notable at the L4-L5 and L3-L4 level of her lumbar spine facets.  Referral to Dr. Daisey Dryer for these injection and possible further evaluation of the low back.  She also will continue to follow-up with Dr. Lydia Sams for pain management.  Discussed with her that I am not able to prescribe her opioids at this time.  Given that most of her pain is at nighttime, I will prescribe Flexeril 5-10 mg to be taken nighttime as needed.  Follow-up: Return for Referral to Dr. Daisey Dryer for eval/facet injections.   Meds & Orders:  Meds ordered this encounter  Medications   cyclobenzaprine (FLEXERIL) 5 MG tablet    Sig: Take 1-2 tablets (5-10 mg total) by mouth at bedtime. Start with 5 mg  nightly x 1 week, if no side effects and need can increase to 10mg  nightly    Dispense:  30 tablet    Refill:  0    Orders Placed This Encounter  Procedures   XR Lumbar Spine Complete   Ambulatory referral to Physical Medicine Rehab     Procedures: No procedures performed      Clinical History: No specialty comments available.  She reports that she has never smoked. She has never used smokeless tobacco.  Recent Labs    06/29/23 1539  HGBA1C 5.4    Objective:    Physical Exam  Gen: Well-appearing, in no acute distress; non-toxic CV:  Well-perfused. Warm.  Resp: Breathing unlabored on room air; no wheezing. Psych: Fluid speech in conversation; appropriate affect; normal thought process  Ortho Exam - Lumbar spine: There is tenderness to palpation at midline spinous process at the lower lumbar spine, but also she is quite tender to light touch surrounding bilateral paraspinal musculature from the upper to lower lumbar spine.  There is full range flexion, there is pain in worsening of her symptoms with extension near the facet region.  Negative straight leg raise bilaterally.  Imaging: XR Lumbar Spine Complete Result Date: 09/25/2023 Complete x-ray view of the lumbar spine including AP, lateral and flexion/extension views were ordered and reviewed by myself today.  X-rays demonstrate notable lumbar DDD with essentially complete loss of the intervertebral disc space at L2-L3 with vertebral sclerosis of L2 and L3.  There is a grade 1 L4 and L5 anterior listhesis.  There is advanced facet arthritis at the L3-L4 and L4-L5 level.  Spinal cord stimulator noted.  Vascular calcification also noted on lateral film.  *I did review MRI of the lumbar spine from 2023 today during the visit. Narrative & Impression  CLINICAL DATA:  Low back pain with symptoms persisting over 6 weeks. Myelopathy, acute, lumbar   EXAM: MRI LUMBAR SPINE WITHOUT CONTRAST   TECHNIQUE: Multiplanar, multisequence MR imaging of the lumbar spine was performed. No intravenous contrast was administered.   COMPARISON:  02/06/2020   FINDINGS: Segmentation:  5 lumbar type vertebrae as previously established   Alignment: Degenerative grade 1 anterolisthesis at L4-5, facet mediated   Vertebrae: Unchanged heterogeneity of marrow with benign appearance. No fracture, discitis, or aggressive bone lesion   Conus medullaris and cauda equina: Conus extends to the L1-2 level. Conus and cauda equina appear normal.   Paraspinal and other soft tissues: Fatty atrophy of  intrinsic back muscles   Disc levels:   L2-L3: Disc narrowing and mild bulging with small right inferior foraminal protrusion. Mild endplate spurring. Mild facet spurring   L3-L4: Mild facet spurring and foraminal disc bulging   L4-L5: Degenerative facet spurring with anterolisthesis. Mild disc space narrowing. Patent canal and foramina. Left subarticular recess narrowing is mild and does not appear compressive.   L5-S1:Mild or moderate degenerative facet spurring. Small right paracentral annular fissure. No impingement.   IMPRESSION: 1. Stable compared to 2021. 2. Lumbar spine degeneration especially affecting the facets at L4-5 with anterolisthesis. No compressive stenosis.     Electronically Signed   By: Ronnette Coke M.D.   On: 02/15/2022 07:54    Past Medical/Family/Surgical/Social History: Medications & Allergies reviewed per EMR, new medications updated. Patient Active Problem List   Diagnosis Date Noted   Chronic cough 07/03/2023   Encounter for Medicare annual wellness exam 04/05/2023   Encounter for immunization 04/05/2023   Atypical chest pain 01/28/2023   Polypharmacy 10/21/2022  Rotator cuff impingement syndrome of left shoulder 10/19/2022   Depression, major, recurrent, mild (HCC) 09/25/2022   Weakness of both lower extremities 09/19/2022   Impingement syndrome of left shoulder 05/11/2022   Disorder of left rotator cuff 03/27/2022   Back pain of lumbar region with sciatica 01/31/2022   Anemia of chronic disease 01/31/2022   Trochanteric bursitis of right hip 12/09/2021   Lumbar back pain 12/06/2021   Chronic buttock pain 12/06/2021   Lumbar facet arthropathy 11/03/2021   Gastroesophageal reflux disease without esophagitis 10/30/2021   Aortic atherosclerosis (HCC) 10/30/2021   Orthostatic hypotension 08/04/2021   Myalgia due to statin 03/25/2021   Fibromyalgia 03/19/2021   Mixed hyperlipidemia 02/02/2021   Iron deficiency anemia 01/21/2021     Class: Diagnosis of   Lumbar radiculopathy 04/23/2020   Peripheral polyneuropathy 04/23/2020   Cervical radiculitis 12/30/2019   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   Foraminal stenosis of cervical region 12/30/2019   Osteopenia after menopause 10/13/2017   Breast cancer of upper-outer quadrant of right female breast (HCC) 05/30/2017   Hypothyroidism (acquired) 09/29/2016   Moderate recurrent major depression (HCC) 09/29/2016   Insomnia 09/29/2016   S/P ablation of atrial fibrillation 09/29/2016   Gastric bypass status for obesity 09/23/2016   H/O cardiac radiofrequency ablation 09/23/2016   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 Jolly Needle. Cardiac ablation, loop recorder (removed in 2022)   Atrial fibrillation with rapid ventricular response (HCC) 04/2015   Breast cancer (HCC)    right lumpectomy   BRONCHITIS 03/10/2009   Qualifier: Diagnosis of  By: Ethelle Herb MD, Johnita Nails    Chest pain with moderate risk for cardiac etiology 04/28/2015   Dehydration 06/18/2009   Qualifier: Diagnosis of  By: Merita Staples     Depression    Fall 07/20/2021   Family history of breast cancer    Family history of colon cancer    Fibromyalgia 2010   Hyperlipidemia    Hypothyroidism (acquired) 2006   s/p thyroidectomy for nodules   Morbid obesity (HCC) 08/10/2015   Obesity    gastric bpg 2011, weight then 400 lbs, lost 100 lbs in 60 days.   Obstructive sleep apnea    does not use cpap, does not have a following physician   Paroxysmal atrial fibrillation (HCC) 08/10/2015   Family History  Problem Relation Age of Onset   Heart failure Father     Cancer Father        colon   Heart failure Maternal Grandmother    Heart failure Maternal Grandfather    Cancer Mother        breast   Past Surgical History:  Procedure Laterality Date   ABDOMINAL HYSTERECTOMY  1992   total.    APPENDECTOMY     BREAST BIOPSY Left 08/04/2022   MM LT BREAST BX W LOC DEV 1ST LESION IMAGE BX SPEC STEREO GUIDE 08/04/2022 [facility]   BREAST BIOPSY Left 08/12/2022   MM LT BREAST BX W LOC DEV 1ST LESION IMAGE BX SPEC STEREO GUIDE 08/12/2022 [facility]   BREAST SURGERY  2019   right lumpectomy    CHOLECYSTECTOMY  COLONOSCOPY     ELECTROPHYSIOLOGIC STUDY N/A 09/08/2015   Procedure: Atrial Fibrillation Ablation;  Surgeon: Jolly Needle, MD;  Location: [facility] INVASIVE CV LAB;  Service: Cardiovascular;  Laterality: N/A;   EP IMPLANTABLE DEVICE N/A 12/17/2015   Procedure: Loop Recorder Insertion;  Surgeon: Jolly Needle, MD;  Location: [facility] INVASIVE CV LAB;  Service: Cardiovascular;  Laterality: N/A;   ESOPHAGOGASTRODUODENOSCOPY     EXPLORATORY LAPAROTOMY WITH ABDOMINAL MASS EXCISION  2018   GASTRIC BYPASS  2011   lost 100 lbs, but gained some back.   implantable loop recorder removal  03/25/2020   removed 2022   NECK SURGERY  2005   laminectomy?   THYROIDECTOMY  2006   for nodules ( hot and cold nodules) Goiter compressing trachea.   Social History   Occupational History   Occupation: Economist  Tobacco Use   Smoking status: Never   Smokeless tobacco: Never  Vaping Use   Vaping status: Never Used  Substance and Sexual Activity   Alcohol use: Yes    Alcohol/week: 1.0 standard drink of alcohol    Types: 1 Standard drinks or equivalent per week    Comment: rare glass of wine   Drug use: No   Sexual activity: Not Currently

## 2023-09-26 ENCOUNTER — Encounter: Payer: Self-pay | Admitting: Family Medicine

## 2023-09-26 ENCOUNTER — Other Ambulatory Visit: Payer: Self-pay | Admitting: Family Medicine

## 2023-09-26 DIAGNOSIS — R600 Localized edema: Secondary | ICD-10-CM

## 2023-10-02 ENCOUNTER — Telehealth: Payer: Self-pay | Admitting: Cardiology

## 2023-10-02 ENCOUNTER — Ambulatory Visit: Attending: Cardiology | Admitting: Cardiology

## 2023-10-02 ENCOUNTER — Encounter: Payer: Self-pay | Admitting: Cardiology

## 2023-10-02 VITALS — BP 110/70 | HR 81 | Ht 67.0 in | Wt 235.0 lb

## 2023-10-02 DIAGNOSIS — R002 Palpitations: Secondary | ICD-10-CM | POA: Diagnosis not present

## 2023-10-02 DIAGNOSIS — I5032 Chronic diastolic (congestive) heart failure: Secondary | ICD-10-CM | POA: Diagnosis not present

## 2023-10-02 DIAGNOSIS — G4733 Obstructive sleep apnea (adult) (pediatric): Secondary | ICD-10-CM | POA: Diagnosis not present

## 2023-10-02 DIAGNOSIS — I480 Paroxysmal atrial fibrillation: Secondary | ICD-10-CM

## 2023-10-02 DIAGNOSIS — D6869 Other thrombophilia: Secondary | ICD-10-CM

## 2023-10-02 MED ORDER — POTASSIUM CHLORIDE CRYS ER 20 MEQ PO TBCR: 20.0000 meq | EXTENDED_RELEASE_TABLET | Freq: Every day | ORAL | 0 refills | Status: DC

## 2023-10-02 NOTE — Progress Notes (Signed)
  Electrophysiology Office Note:   Date:  10/02/2023  ID:  Monique, Kelly September 16, 1955, MRN [id number]  Primary Cardiologist: None Primary Heart Failure: None Electrophysiologist: Lancer Thurner Cortland Ding, MD      History of Present Illness:   Monique Kelly is a 68 y.o. female with h/o hypothyroidism, hyperlipidemia, sleep apnea, atrial fibrillation seen today for routine electrophysiology followup.   Since last being seen in our clinic the patient reports continuing low back pain despite her spinal cord stimulator.  She has not noted any atrial fibrillation since last May.  She apparently had a colonoscopy and was found to be in atrial fibrillation.  She was sent to [facility] at that time.  She was given IV medications and converted to normal rhythm.  Over the last month or 2, she has had worsening lower extremity edema.  He finds it painful.  She also states that her legs have been heavy..  she denies chest pain, palpitations, dyspnea, PND, orthopnea, nausea, vomiting, dizziness, syncope, weight gain, or early satiety.   Review of systems complete and found to be negative unless listed in HPI.   EP Information / Studies Reviewed:    EKG is ordered today. Personal review as below.  EKG Interpretation Date/Time:  Monday Oct 02 2023 12:04:19 EDT Ventricular Rate:  81 PR Interval:    QRS Duration:  66 QT Interval:  362 QTC Calculation: 420 R Axis:   3  Text Interpretation: Sinus rhythm Premature atrial complexes Interpretation limited secondary to artifact Low voltage QRS Septal infarct , age undetermined When compared with ECG of 25-Jul-2019 08:12, No significant change since last tracing Confirmed by Monique Kelly ([id number]) on 10/02/2023 12:07:15 PM     Risk Assessment/Calculations:    CHA2DS2-VASc Score = 2   This indicates a 2.2% annual risk of stroke. The patient's score is based upon: CHF History: 0 HTN History: 0 Diabetes History: 0 Stroke History: 0 Vascular  Disease History: 0 Age Score: 1 Gender Score: 1            Physical Exam:   VS:  BP 110/70 (BP Location: Right Arm, Patient Position: Sitting, Cuff Size: Large)   Pulse 81   Ht 5\' 7"  (1.702 m)   Wt 235 lb (106.6 kg)   SpO2 98%   BMI 36.81 kg/m    Wt Readings from Last 3 Encounters:  10/02/23 235 lb (106.6 kg)  08/24/23 226 lb (102.5 kg)  07/28/23 234 lb 9.6 oz (106.4 kg)     GEN: Well nourished, well developed in no acute distress NECK: No JVD; No carotid bruits CARDIAC: Regular rate and rhythm, no murmurs, rubs, gallops RESPIRATORY:  Clear to auscultation without rales, wheezing or rhonchi  ABDOMEN: Soft, non-tender, non-distended EXTREMITIES:  2+ edema; No deformity   ASSESSMENT AND PLAN:    1.  Paroxysmal atrial fibrillation: Post ablation in 2017.  In sinus rhythm today.  She apparently had episodes of atrial fibrillation a year ago.  Monique Kelly get [facility].  2.  Atypical chest pain: No current pain  3.  Obstructive sleep apnea: CPAP compliance encouraged: Has dental device  4.  Acute on chronic diastolic heart failure: Has significant lower extremity edema.  She has been taking Lasix .  Monique Kelly have her take 40 mg of Lasix  daily for the next 7 days.  She can follow-up with cardiology In [place].   Follow up with Dr. Lawana Pray in 12 months  Signed, Monique Kelly Cortland Ding, MD

## 2023-10-02 NOTE — Telephone Encounter (Signed)
 No voicemail on cell phone, LVM on home phone for patient to call and schedule an appt/kbl 10/02/23

## 2023-10-02 NOTE — Addendum Note (Signed)
 Addended by: CHAUVIGNE, Sheletha Bow on: 10/02/2023 12:26 PM   Modules accepted: Orders

## 2023-10-02 NOTE — Patient Instructions (Signed)
 Medication Instructions:  Your physician has recommended you make the following change in your medication:  1) INCREASE Lasix  (furosemide ) to 40 mg daily for one week 2) START taking potassium 20 meq once daily while you are on increase dose of lasix  for one week  *If you need a refill on your cardiac medications before your next appointment, please call your pharmacy*  Follow-Up: At [facility], you and your health needs are our priority.  As part of our continuing mission to provide you with exceptional heart care, our providers are all part of one team.  This team includes your primary Cardiologist (physician) and Advanced Practice Providers or APPs (Physician Assistants and Nurse Practitioners) who all work together to provide you with the care you need, when you need it.  Your next appointment:   2 weeks  Provider:   Pattricia Bores, NP [facility])

## 2023-10-02 NOTE — Telephone Encounter (Signed)
-----   Message from Nurse Bobby Burr C sent at 10/02/2023 12:32 PM EDT ----- Patient needs an appointment with Pattricia Bores in two weeks per Dr. Lawana Pray.   Thanks! Carly

## 2023-10-03 NOTE — Telephone Encounter (Signed)
 Patient scheduled/kbl 10/03/23

## 2023-10-06 ENCOUNTER — Ambulatory Visit: Admitting: Physical Medicine and Rehabilitation

## 2023-10-12 ENCOUNTER — Ambulatory Visit: Admitting: Physical Medicine and Rehabilitation

## 2023-10-13 ENCOUNTER — Encounter: Payer: Self-pay | Admitting: Family Medicine

## 2023-10-24 NOTE — Progress Notes (Unsigned)
 Cardiology Office Note   Date:  10/25/2023  ID:  Zaela, Graley [birth year], MRN [id number] PCP: Mercy Stall, MD  [facility] Providers Cardiologist:  Ralene Burger, MD Electrophysiologist:  Lei Pump, MD     History of Present Illness Monique Kelly is a 68 y.o. female with a past medical history of atrial fibrillation s/p ablation on Eliquis , nonobstructive CAD per coronary CTA, dyslipidemia, hypothyroidism, GERD, chronic back pain with implanted stimulator.  10/14/2022 echo EF 55 to 60%, trivial MR, PASP 35 mmHg 09/23/2021 echo EF 55 to 60%, grade 1 DD, LA mild to moderately dilated 07/10/2021 monitor predominantly in sinus rhythm with frequent PACs as well as episodes of atrial fibrillation, 110 beat run of NSVT 06/02/2021 coronary CTA calcium score 296, 93rd percentile, moderate stenosis however FFR was negative for hemodynamic significance 2017 atrial fibrillation ablation  Longstanding patient with our EP department.  S/p atrial fibrillation ablation in 2017.  She had an echocardiogram in 2024 revealing a normal EF, PASP 35 mmHg and trivial MR.  Most recently she was evaluated by Dr. Lawana Pray on 10/02/2023, she had recently had a colonoscopy and was found to be in atrial fibrillation and was sent to [facility], given IV medications and converted to sinus rhythm.  Over the last 2 months she had worsening pedal edema however she was maintaining sinus rhythm.  She was noted to have significant pedal edema and was advised to take Lasix  40 mg daily for the next 7 days and follow-up with general cardiology.  She presents today for follow-up after diuresing as outlined above.  She is down approximately 8 pounds although she does still have appreciable pedal edema.  She is also bothered by some DOE.  She questions whether she can stop taking her Eliquis , I reminded her that she just had an episode of atrial fibrillation earlier this year and needs to continue her  anticoagulant.  We discussed her recent issue with pedal edema and the need to investigate this further. She denies chest pain, palpitations,  pnd, orthopnea, n, v, dizziness, syncope, weight gain, or early satiety.   ROS: Review of Systems  Respiratory:  Positive for shortness of breath.   Cardiovascular:  Positive for leg swelling.  Musculoskeletal:  Positive for back pain.  All other systems reviewed and are negative.    Studies Reviewed      Cardiac Studies & Procedures   ______________________________________________________________________________________________   STRESS TESTS  MYOCARDIAL PERFUSION IMAGING 04/29/2019  Narrative  Nuclear stress EF: 54%.  There was no ST segment deviation noted during stress.  The study is normal.  This is a low risk study.  The left ventricular ejection fraction is normal (55-65%).  Normal pharmacologic nuclear stress test with no evidence for prior infarct or ischemia.  LVEF 54%.   ECHOCARDIOGRAM  ECHOCARDIOGRAM COMPLETE 09/23/2021  Narrative ECHOCARDIOGRAM REPORT    Patient Name:   Monique Kelly Date of Exam: 09/23/2021 Medical Rec #:  [id number]         Height:       67.0 in Accession #:    [id number]        Weight:       263.0 lb Date of Birth:  [birth year]         BSA:          2.272 m Patient Age:    65 years          BP:  118/80 mmHg Patient Gender: F                 HR:           79 bpm. Exam Location:  [place]  Procedure: 2D Echo, Cardiac Doppler, Color Doppler and Strain Analysis  Indications:    Shortness of breath [R06.02 (ICD-10-CM)]  History:        Patient has prior history of Echocardiogram examinations, most recent 08/16/2019. Arrythmias:Atrial Fibrillation; Risk Factors:Dyslipidemia.  Sonographer:    Erminia Hazel RDCS Referring Phys: [id number] [name] LYNN URSUY   Sonographer Comments: Image acquisition challenging due to patient body habitus. IMPRESSIONS   1. Left ventricular ejection  fraction, by estimation, is 55 to 60%. The left ventricle has normal function. The left ventricle has no regional wall motion abnormalities. Left ventricular diastolic parameters are consistent with Grade I diastolic dysfunction (impaired relaxation). 2. Right ventricular systolic function is normal. The right ventricular size is normal. 3. Left atrial size was mild to moderately dilated. 4. The mitral valve is normal in structure. No evidence of mitral valve regurgitation. No evidence of mitral stenosis. 5. The aortic valve is normal in structure. Aortic valve regurgitation is not visualized. No aortic stenosis is present. 6. The inferior vena cava is normal in size with greater than 50% respiratory variability, suggesting right atrial pressure of 3 mmHg.  FINDINGS Left Ventricle: Left ventricular ejection fraction, by estimation, is 55 to 60%. The left ventricle has normal function. The left ventricle has no regional wall motion abnormalities. The left ventricular internal cavity size was normal in size. There is no left ventricular hypertrophy. Left ventricular diastolic parameters are consistent with Grade I diastolic dysfunction (impaired relaxation).  Right Ventricle: The right ventricular size is normal. No increase in right ventricular wall thickness. Right ventricular systolic function is normal.  Left Atrium: Left atrial size was mild to moderately dilated.  Right Atrium: Right atrial size was normal in size.  Pericardium: There is no evidence of pericardial effusion.  Mitral Valve: The mitral valve is normal in structure. No evidence of mitral valve regurgitation. No evidence of mitral valve stenosis.  Tricuspid Valve: The tricuspid valve is normal in structure. Tricuspid valve regurgitation is not demonstrated. No evidence of tricuspid stenosis.  Aortic Valve: The aortic valve is normal in structure. Aortic valve regurgitation is not visualized. No aortic stenosis is  present.  Pulmonic Valve: The pulmonic valve was normal in structure. Pulmonic valve regurgitation is not visualized. No evidence of pulmonic stenosis.  Aorta: The aortic root is normal in size and structure.  Venous: The inferior vena cava is normal in size with greater than 50% respiratory variability, suggesting right atrial pressure of 3 mmHg.  IAS/Shunts: No atrial level shunt detected by color flow Doppler.   LEFT VENTRICLE PLAX 2D LVIDd:         5.20 cm   Diastology LVIDs:         3.40 cm   LV e' medial:    5.55 cm/s LV PW:         1.00 cm   LV E/e' medial:  11.5 LV IVS:        1.20 cm   LV e' lateral:   6.96 cm/s LVOT diam:     2.00 cm   LV E/e' lateral: 9.2 LV SV:         60 LV SV Index:   26 LVOT Area:     3.14 cm   RIGHT VENTRICLE  IVC RV Basal diam:  2.50 cm     IVC diam: 1.90 cm RV S prime:     12.60 cm/s TAPSE (M-mode): 1.8 cm  LEFT ATRIUM             Index        RIGHT ATRIUM          Index LA diam:        4.60 cm 2.02 cm/m   RA Area:     9.99 cm LA Vol (A2C):   36.7 ml 16.15 ml/m  RA Volume:   22.00 ml 9.68 ml/m LA Vol (A4C):   57.2 ml 25.18 ml/m LA Biplane Vol: 46.5 ml 20.47 ml/m AORTIC VALVE LVOT Vmax:   90.70 cm/s LVOT Vmean:  66.400 cm/s LVOT VTI:    0.191 m  AORTA Ao Root diam: 3.40 cm Ao Asc diam:  2.90 cm Ao Desc diam: 2.10 cm  MITRAL VALVE MV Area (PHT): 4.63 cm    SHUNTS MV Decel Time: 164 msec    Systemic VTI:  0.19 m MV E velocity: 63.80 cm/s  Systemic Diam: 2.00 cm MV A velocity: 60.80 cm/s MV E/A ratio:  1.05  Burrell Casa Revankar MD Electronically signed by Hillis Lu MD Signature Date/Time: 09/23/2021/3:18:18 PM    Final    MONITORS  CARDIAC EVENT MONITOR 06/29/2021  Narrative Predominant rhythm is sinus rhythmn Frequent premature atrial contractions are noted Atrial fibrillation is observed A single 10 beat run of nonsustained ventricular tachycardia is observed   CT SCANS  CT CORONARY FRACTIONAL FLOW  RESERVE DATA PREP 06/02/2021  Narrative EXAM: CT-FFR ANALYSIS  CLINICAL DATA:  Possibly obstructive coronary lesion (left main).  FINDINGS: CT-FFR analysis was performed on the original cardiac CT angiogram dataset. Diagrammatic representation of the CT-FFR analysis is provided in a separate PDF document in PACS. This dictation was created using the PDF document and an interactive 3D model of the results. 3D model is not available in the EMR/PACS. Normal FFR range is >0.80.  1. Left Main: No significant functional stenosis, CT-FFR 0.98 at lesion.  2. LAD: No significant functional stenosis, CT-FFR 0.96 at lesion. 3. LCX: No significant functional stenosis, CT-FFR 0.97 at lesion. 4. RCA: Unable to model due to artifact but no associated lesion.  IMPRESSION: 1. CT FFR analysis shows no evidence of significant functional stenosis.  Gloriann Larger MD   Electronically Signed By: Gloriann Larger M.D. On: 06/02/2021 16:59   CT CORONARY MORPH W/CTA COR W/SCORE 06/02/2021  Addendum 06/02/2021  3:16 PM ADDENDUM REPORT: 06/02/2021 15:14  CLINICAL DATA:  68 Year old White Female  EXAM: Cardiac/Coronary  CTA  TECHNIQUE: The patient was scanned on a [employer].  FINDINGS: Scan was triggered in the descending thoracic aorta. Axial non-contrast 3 mm slices were carried out through the heart. The data set was analyzed on a dedicated work station and scored using the Agatson method. Gantry rotation speed was 250 msecs and collimation was .6 mm. 0.8 mg of sl NTG was given. The 3D data set was reconstructed in 5% intervals of the 67-82 % of the R-R cycle. Diastolic phases were analyzed on a dedicated work station using MPR, MIP and VRT modes. The patient received 100 cc of contrast.  Aorta:  Normal size.  No dissection.  Aortic Valve:  Tri-leaflet.  Aortic valve calcium score 260.  Coronary Arteries:  Normal coronary origin.  Right  dominance.  Coronary Calcium Score:  Left main: 215  Left anterior descending artery:  58  Left circumflex artery: 23  Right coronary artery: 0  Total: 296  Percentile: 93rd for age, sex, and race matched control.  RCA is a large dominant artery that gives rise to PDA and PLA. Significant artifact but grossly no plaque.  Left main is a large artery that gives rise to LAD and LCX arteries. There is a moderate calcified lesion in the body of the left main. Lesion approaches 50% left main disease.  LAD is a large vessel that gives rise to two diagonal branches. Minimal calcified plaque in the proximal vessel minimal mixed plaque in the mid vessel.  LCX is a non-dominant artery that gives rise to one large OM1 branch. Significant artifact but only mild proximal calcified plaque in the proximal vessel .  Other findings:  Normal pulmonary vein drainage into the left atrium.  Normal left atrial appendage without a thrombus.  Extra-cardiac findings: See attached radiology report for non-cardiac structures.  Prominent motion artifact at level of the pulmonary artery bifurcation.  IMPRESSION: 1. Coronary calcium score of 296. This was 93rd percentile for age, sex, and race matched control.  2. Normal coronary origin with right dominance.  3. CAD-RADS 3. Moderate stenosis. Consider symptom-guided anti-ischemic pharmacotherapy as well as risk factor modification per guideline directed care.  We will attempt to send for additional analysis with CT FFR, but given the level of artifact this analysis may not be available. Cardiac catheterization may be reasonable.  RECOMMENDATIONS:  Coronary artery calcium (CAC) score is a strong predictor of incident coronary heart disease (CHD) and provides predictive information beyond traditional risk factors. CAC scoring is reasonable to use in the decision to withhold, postpone, or initiate statin therapy in intermediate-risk or  selected borderline-risk asymptomatic adults (age 40-75 years and LDL-C >=70 to <190 mg/dL) who do not have diabetes or established atherosclerotic cardiovascular disease (ASCVD).* In intermediate-risk (10-year ASCVD risk >=7.5% to <20%) adults or selected borderline-risk (10-year ASCVD risk >=5% to <7.5%) adults in whom a CAC score is measured for the purpose of making a treatment decision the following recommendations have been made:  If CAC = 0, it is reasonable to withhold statin therapy and reassess in 5 to 10 years, as long as higher risk conditions are absent (diabetes mellitus, family history of premature CHD in first degree relatives (males <55 years; females <65 years), cigarette smoking, LDL >=190 mg/dL or other independent risk factors).  If CAC is 1 to 99, it is reasonable to initiate statin therapy for patients >=67 years of age.  If CAC is >=100 or >=75th percentile, it is reasonable to initiate statin therapy at any age.  Cardiology referral should be considered for patients with CAC scores =400 or >=75th percentile.  *2018 AHA/ACC/AACVPR/AAPA/ABC/ACPM/ADA/AGS/APhA/ASPC/NLA/PCNA Guideline on the Management of Blood Cholesterol: A Report of the American College of Cardiology/American Heart Association Task Force on Clinical Practice Guidelines. J Am Coll Cardiol. 2019;73(24):3168-3209.  Gloriann Larger, MD   Electronically Signed By: Gloriann Larger M.D. On: 06/02/2021 15:14  Narrative EXAM: OVER-READ INTERPRETATION  CT CHEST  The following report is an over-read performed by radiologist Dr. Lore Rode of [facility] on 06/02/2021. This over-read does not include interpretation of cardiac or coronary anatomy or pathology. The coronary CTA interpretation by the cardiologist is attached.  COMPARISON:  Chest radiograph 07/25/2019.  Coronary CTA 08/28/2015.  FINDINGS: Vascular: Aortic atherosclerosis. No central pulmonary embolism,  on this non-dedicated study.  Mediastinum/Nodes: No imaged thoracic adenopathy.  Lungs/Pleura: No pleural fluid. Moderate right  hemidiaphragm elevation. Right lung base volume loss. Bibasilar scarring.  Calcified granulomas involve the right lower lobe.  The previously described right middle lobe nodule is subtly apparent at 1.0 cm on 14/9 and can be presumed benign, given stability compared to 08/28/2015.  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 Normal imaged portions of the liver, spleen.  Musculoskeletal: Surgical changes in the right breast. Remote anterior right rib fractures. Subtle sclerosis involving the anterior left lower rib on 52/8 is favored to be related to remote trauma.  IMPRESSION: 1.  No acute findings in the imaged extracardiac chest. 2. Aortic Atherosclerosis (ICD10-I70.0). Right hemidiaphragm elevation with right base volume loss and bibasilar scarring. Right middle lobe 1.0 cm nodule is similar in 2017 and considered benign  Electronically Signed: By: Lore Rode M.D. On: 06/02/2021 14:30   CT SCANS  CT CORONARY MORPH W/CTA COR W/SCORE 08/28/2015  Addendum 08/28/2015  4:25 PM ADDENDUM REPORT: 08/28/2015 16:23 CLINICAL DATA:  Atrial fibrillation scheduled for an ablation. EXAM: Cardiac CT/CTA TECHNIQUE: The patient was scanned on a Siem[employer] scanner. FINDINGS: A 120 kV prospective scan was triggered in the descending thoracic aorta at 111 HU's. Gantry rotation speed was 280 msecs and collimation was .9 mm. 5 mg of iv metoprolol  and no NTG was given. The 3D data set was reconstructed in 5% intervals of the 60-80 % of the R-R cycle. Diastolic phases were analyzed on a dedicated work station using MPR, MIP and VRT modes. The patient received 80 cc of contrast. There is normal pulmonary vein drainage into the left atrium (2 on the right and 2 on the left) with ostial measurements as  follows: RUPV:  20 x 15 mm RLPV:  23 x 20 mm Left common trunk:  28 x 20 mm LUPV:  16 x 14 mm LLPV:  21 x 18 mm The left atrial appendage is chicken wing type with one major lobe and ostial size 24 x 14 mm and length 42 mm. There is no thrombus in the left atrial appendage. The esophagus runs to the left from midline and is in the proximity to the left common pulmonary vein trunk. Aorta:  Normal caliber.  No dissection or calcifications. Aortic Valve:  Trileaflet.  No calcifications. Coronary Arteries:  Normal coronary origin. Right dominance. Coronary score is 132 that represents 93 percentile for age/sex. Left main has mild calcified plaque with associated stenosis 25-50%. LAD has minimal focal calcified plaque with associated stenosis 0-25%. LCX artery has no plaque. RCA is a dominant vessel that gives rise to PDA and PLA. There is no plaque. IMPRESSION: 1. There is normal pulmonary vein drainage into the left atrium. 2. The left atrial appendage is chicken wing type with one major lobe and ostial size 24 x 14 mm and length 42 mm. There is no thrombus in the left atrial appendage. 3. The esophagus runs to the left from midline and is in the proximity to the left common pulmonary vein trunk. 4. Coronary score is 132 that represents 93 percentile for age/sex. 5. Normal coronary origin with right dominance. Mild non-obstructive plaque seen in LM and LAD. An aggressive risk factor modification is recommended. Christoper Crafts Electronically Signed By: Christoper Crafts On: 08/28/2015 16:23  Narrative EXAM: OVER-READ INTERPRETATION  CT CHEST  The following report is an over-read performed by radiologist Dr. Ala Alice [facility] on 08/28/2015. This over-read does not include interpretation of cardiac or coronary anatomy or pathology. The  coronary CTA interpretation by the cardiologist is attached.  COMPARISON:   04/28/2015  FINDINGS: Mediastinum/Nodes: Atherosclerotic calcification of the aortic arch. Calcified lower paraesophageal lymph node compatible with old granulomatous disease.  Lungs/Pleura: Calcified 7 mm right lower lobe pulmonary nodule, image 34/204, compatible with calcified granuloma. Noncalcified right upper lobe nodule, 5 mm diameter on image 4/204. Linear scarring or subsegmental atelectasis in the right upper lobe and right middle lobe. 3 mm calcified subpleural nodule in the right middle lobe, image 19/204, no change from 02/25/2014. Noncalcified 0.7 by 1.1 cm right middle lobe nodule, image 23/204, no change from 03/28/2014. Linear subsegmental atelectasis or scarring in the left lower lobe and lingula. Noncalcified 5 by 4 mm subpleural nodule along the inferior margin of the left major fissure, image 42/204, no change from 03/28/2014.  Upper abdomen: Postoperative findings in the stomach.  Musculoskeletal: Thoracic spondylosis.  IMPRESSION: 1. There multiple pulmonary nodules in the visualized part of the chest. Some of these are calcified, reassuring for benign granulomas. Summer noncalcified. The right middle lobe 1.1 by 0.7 cm noncalcified nodule is not changed from 03/28/2014, which is reassuring, although a full 2 years observation would be suggested. In addition, there is a 5 mm right upper lobe noncalcified nodule which should also be followed up. I recommend a followup CT chest in 12 months time in order to reassess both of these nodules and ensure benign etiology. 2. Thoracic spondylosis. 3. Postoperative findings in the stomach.  Electronically Signed: By: Freida Jes M.D. On: 08/28/2015 10:44     ______________________________________________________________________________________________      Risk Assessment/Calculations  CHA2DS2-VASc Score = 4   This indicates a 4.8% annual risk of stroke. The patient's score is based upon:  CHF  History: 1 HTN History: 1 Diabetes History: 0 Stroke History: 0 Vascular Disease History: 0 Age Score: 1 Gender Score: 1         Physical Exam VS:  BP (!) 140/90   Pulse 79   Ht 5\' 7"  (1.702 m)   Wt 227 lb (103 kg)   SpO2 94%   BMI 35.55 kg/m    Wt Readings from Last 3 Encounters:  10/25/23 227 lb (103 kg)  10/02/23 235 lb (106.6 kg)  08/24/23 226 lb (102.5 kg)    GEN: Well nourished, well developed in no acute distress NECK: No JVD; No carotid bruits CARDIAC: RRR, no murmurs, rubs, gallops RESPIRATORY:  Clear to auscultation without rales, wheezing or rhonchi  ABDOMEN: Soft, non-tender, non-distended EXTREMITIES:  edematous to mid tibia,  No deformity   ASSESSMENT AND PLAN  Paroxysmal atrial fibrillation/hypercoagulable state-s/p ablation 2017, recurrent episode earlier this year she was preparing for a colonoscopy.  Continue Eliquis  5 mg twice daily-just indication for dose reduction, will repeat CBC, CMET.  Continue Cardizem  120 mg daily.  CAD-nonobstructive per coronary CTA in 2023.  She is on Eliquis  therefore she is not on aspirin.  DOE/pedal edema-likely component of HFpEF, will repeat echocardiogram as well as a proBNP.  She is currently on Lasix  40 mg daily, will repeat c-Met.  We did discuss SGLT2 inhibitor however she states she has a history of recent UTIs and this might not be a good option for her at this time.  Hypertension-her blood pressure is elevated in the office today at 140/90 although she states is typically well-controlled at home, she apparently had an encounter with someone in the parking lot that got her upset.  Will continue to monitor for now.  Continue Cardizem  120 mg  daily.  Dyslipidemia-currently on Repatha , most recent LDL was well-controlled at 49 on 06/29/2023.       Dispo: CMET, CBC, proBNP, echocardiogram.  She needs to establish with a primary cardiologist will have her see Dr. Gordan Latina in 3 months.  Signed, Terrance Ferretti, NP

## 2023-10-25 ENCOUNTER — Ambulatory Visit: Attending: Cardiology | Admitting: Cardiology

## 2023-10-25 ENCOUNTER — Encounter: Payer: Self-pay | Admitting: Cardiology

## 2023-10-25 VITALS — BP 140/90 | HR 79 | Ht 67.0 in | Wt 227.0 lb

## 2023-10-25 DIAGNOSIS — I480 Paroxysmal atrial fibrillation: Secondary | ICD-10-CM

## 2023-10-25 DIAGNOSIS — D6859 Other primary thrombophilia: Secondary | ICD-10-CM

## 2023-10-25 DIAGNOSIS — I5032 Chronic diastolic (congestive) heart failure: Secondary | ICD-10-CM | POA: Diagnosis not present

## 2023-10-25 DIAGNOSIS — R0609 Other forms of dyspnea: Secondary | ICD-10-CM

## 2023-10-25 DIAGNOSIS — I2510 Atherosclerotic heart disease of native coronary artery without angina pectoris: Secondary | ICD-10-CM | POA: Diagnosis not present

## 2023-10-25 NOTE — Patient Instructions (Signed)
 Medication Instructions:  Your physician recommends that you continue on your current medications as directed. Please refer to the Current Medication list given to you today.  *If you need a refill on your cardiac medications before your next appointment, please call your pharmacy*  Lab Work: Your physician recommends that you return for lab work in:   Labs today: CMP, CBC, Pro BNP  If you have labs (blood work) drawn today and your tests are completely normal, you will receive your results only by: MyChart Message (if you have MyChart) OR A paper copy in the mail If you have any lab test that is abnormal or we need to change your treatment, we will call you to review the results.  Testing/Procedures: Your physician has requested that you have an echocardiogram. Echocardiography is a painless test that uses sound waves to create images of your heart. It provides your doctor with information about the size and shape of your heart and how well your heart's chambers and valves are working. This procedure takes approximately one hour. There are no restrictions for this procedure. Please do NOT wear cologne, perfume, aftershave, or lotions (deodorant is allowed). Please arrive 15 minutes prior to your appointment time.  Please note: We ask at that you not bring children with you during ultrasound (echo/ vascular) testing. Due to room size and safety concerns, children are not allowed in the ultrasound rooms during exams. Our front office staff cannot provide observation of children in our lobby area while testing is being conducted. An adult accompanying a patient to their appointment will only be allowed in the ultrasound room at the discretion of the ultrasound technician under special circumstances. We apologize for any inconvenience.   Follow-Up: At [facility], you and your health needs are our priority.  As part of our continuing mission to provide you with exceptional heart care,  our providers are all part of one team.  This team includes your primary Cardiologist (physician) and Advanced Practice Providers or APPs (Physician Assistants and Nurse Practitioners) who all work together to provide you with the care you need, when you need it.  Your next appointment:   3 month(s)  Provider:   Ralene Burger, MD    We recommend signing up for the patient portal called "MyChart".  Sign up information is provided on this After Visit Summary.  MyChart is used to connect with patients for Virtual Visits (Telemedicine).  Patients are able to view lab/test results, encounter notes, upcoming appointments, etc.  Non-urgent messages can be sent to your provider as well.   To learn more about what you can do with MyChart, go to [email or web address].   Other Instructions None

## 2023-10-26 ENCOUNTER — Ambulatory Visit: Payer: Self-pay | Admitting: Cardiology

## 2023-10-26 LAB — PRO B NATRIURETIC PEPTIDE: NT-Pro BNP: 409 pg/mL — ABNORMAL HIGH (ref 0–301)

## 2023-10-26 LAB — CBC
Hematocrit: 42.6 % (ref 34.0–46.6)
Hemoglobin: 13.9 g/dL (ref 11.1–15.9)
MCH: 32.5 pg (ref 26.6–33.0)
MCHC: 32.6 g/dL (ref 31.5–35.7)
MCV: 100 fL — ABNORMAL HIGH (ref 79–97)
Platelets: 252 x10E3/uL (ref 150–450)
RBC: 4.28 x10E6/uL (ref 3.77–5.28)
RDW: 13.1 % (ref 11.7–15.4)
WBC: 7.2 x10E3/uL (ref 3.4–10.8)

## 2023-10-26 LAB — COMPREHENSIVE METABOLIC PANEL WITH GFR
ALT: 9 IU/L (ref 0–32)
AST: 14 IU/L (ref 0–40)
Albumin: 3.7 g/dL — ABNORMAL LOW (ref 3.9–4.9)
Alkaline Phosphatase: 114 IU/L (ref 44–121)
BUN/Creatinine Ratio: 17 (ref 12–28)
BUN: 18 mg/dL (ref 8–27)
Bilirubin Total: 0.4 mg/dL (ref 0.0–1.2)
CO2: 27 mmol/L (ref 20–29)
Calcium: 8.4 mg/dL — ABNORMAL LOW (ref 8.7–10.3)
Chloride: 100 mmol/L (ref 96–106)
Creatinine, Ser: 1.03 mg/dL — ABNORMAL HIGH (ref 0.57–1.00)
Globulin, Total: 2.4 g/dL (ref 1.5–4.5)
Glucose: 96 mg/dL (ref 70–99)
Potassium: 3.6 mmol/L (ref 3.5–5.2)
Sodium: 141 mmol/L (ref 134–144)
Total Protein: 6.1 g/dL (ref 6.0–8.5)
eGFR: 60 mL/min/1.73

## 2023-10-30 ENCOUNTER — Ambulatory Visit: Admitting: Physical Medicine and Rehabilitation

## 2023-10-30 ENCOUNTER — Encounter: Payer: Self-pay | Admitting: Physical Medicine and Rehabilitation

## 2023-10-30 ENCOUNTER — Encounter: Payer: Self-pay | Admitting: Family Medicine

## 2023-10-30 ENCOUNTER — Ambulatory Visit (INDEPENDENT_AMBULATORY_CARE_PROVIDER_SITE_OTHER): Payer: PPO | Admitting: Family Medicine

## 2023-10-30 VITALS — BP 110/70 | HR 75 | Temp 98.0°F | Ht 67.0 in | Wt 227.0 lb

## 2023-10-30 DIAGNOSIS — G894 Chronic pain syndrome: Secondary | ICD-10-CM | POA: Diagnosis not present

## 2023-10-30 DIAGNOSIS — E039 Hypothyroidism, unspecified: Secondary | ICD-10-CM

## 2023-10-30 DIAGNOSIS — M47816 Spondylosis without myelopathy or radiculopathy, lumbar region: Secondary | ICD-10-CM | POA: Diagnosis not present

## 2023-10-30 DIAGNOSIS — Z170 Estrogen receptor positive status [ER+]: Secondary | ICD-10-CM | POA: Diagnosis not present

## 2023-10-30 DIAGNOSIS — Z95811 Presence of heart assist device: Secondary | ICD-10-CM | POA: Insufficient documentation

## 2023-10-30 DIAGNOSIS — I700 Atherosclerosis of aorta: Secondary | ICD-10-CM | POA: Diagnosis not present

## 2023-10-30 DIAGNOSIS — G8929 Other chronic pain: Secondary | ICD-10-CM

## 2023-10-30 DIAGNOSIS — T466X5A Adverse effect of antihyperlipidemic and antiarteriosclerotic drugs, initial encounter: Secondary | ICD-10-CM | POA: Diagnosis not present

## 2023-10-30 DIAGNOSIS — M545 Low back pain, unspecified: Secondary | ICD-10-CM | POA: Diagnosis not present

## 2023-10-30 DIAGNOSIS — C50411 Malignant neoplasm of upper-outer quadrant of right female breast: Secondary | ICD-10-CM | POA: Diagnosis not present

## 2023-10-30 DIAGNOSIS — E782 Mixed hyperlipidemia: Secondary | ICD-10-CM

## 2023-10-30 DIAGNOSIS — F331 Major depressive disorder, recurrent, moderate: Secondary | ICD-10-CM | POA: Diagnosis not present

## 2023-10-30 DIAGNOSIS — I480 Paroxysmal atrial fibrillation: Secondary | ICD-10-CM | POA: Diagnosis not present

## 2023-10-30 DIAGNOSIS — M791 Myalgia, unspecified site: Secondary | ICD-10-CM | POA: Diagnosis not present

## 2023-10-30 MED ORDER — FUROSEMIDE 40 MG PO TABS: 40.0000 mg | ORAL_TABLET | Freq: Every day | ORAL | 2 refills | Status: AC

## 2023-10-30 MED ORDER — POTASSIUM CHLORIDE CRYS ER 20 MEQ PO TBCR: 20.0000 meq | EXTENDED_RELEASE_TABLET | Freq: Every day | ORAL | 2 refills | Status: AC

## 2023-10-30 NOTE — Assessment & Plan Note (Signed)
 Previously well controlled Continue Synthroid at current dose

## 2023-10-30 NOTE — Progress Notes (Signed)
 Monique Kelly - 68 y.o. female MRN [id number]  Date of birth: 24-Sep-1955  Office Visit Note: Visit Date: 10/30/2023 PCP: Monique Stall, MD Referred by: Monique Stall, MD  Subjective: Chief Complaint  Patient presents with   Lower Back - Pain    Patient is here with low back pain. She states that the pain is at the end of her spine and radiates around like a belt.  The pain increases at night to the point it will wake her.  The pain is so bad in the morning that she can hardly get up. She has to sit in the recliner for four hours with a heating pad before she can get going.  Occasionally she has leg pain.  She has fibromyalgia.  She has a spinal cord stimulator that helps some.    HPI: Monique Kelly is a 68 y.o. female who comes in today per the request of Monique Kelly for evaluation of chronic, worsening and severe bilateral lower back pain. Intermittent pain radiating to thoracic and upper back regions. Pain ongoing for several years. Her pain becomes severe when laying down to sleep. States she is not able to stand up straight after getting up in the morning. She describes her pain as sore and stabbing sensation, currently rates as 6 out of 10. Some relief of pain with home exercise regimen, rest and use of medications. She reports history of chronic pain management with Tramadol  several years ago that was beneficial in alleviating her pain. History of formal physical therapy in the past with minimal relief of pain. Lumbar MRI imaging from 2023 shows degenerative grade 1 anterolisthesis at L4-L5, facet mediated. No compressive stenosis noted. She underwent placement of [employer] spinal cord stimulator with Monique Kelly ([facility]) in June of 2024. She reports some relief of pain with spinal cord stimulator, about 80% relief of pain, both to her legs and lower back. She rarely experiences bilateral leg pain. Prior to SCS placement  she underwent bilateral L4-L5 and L5-S1 medial branch blocks and RFA with Monique Kelly in 2023. She reports increased with ablation procedure. She is here today to discuss treatment options. Patient denies focal weakness, numbness and tingling. No recent trauma or falls.   Her cause is complicated by fibromyalgia, atrial fibrillation (currently taking Eliquis ),      Review of Systems  Musculoskeletal:  Positive for back pain and myalgias.  Neurological:  Negative for tingling, sensory change, focal weakness and weakness.  All other systems reviewed and are negative.  Otherwise per HPI.  Assessment & Plan: Visit Diagnoses:    ICD-10-CM   1. Chronic bilateral low back pain without sciatica  M54.50    G89.29     2. Spondylosis without myelopathy or radiculopathy, lumbar region  M47.816     3. Lumbar facet arthropathy  M47.816     4. Chronic pain syndrome  G89.4        Plan: Findings:  Chronic, worsening and severe bilateral lower back pain. Intermittent pain radiating to thoracic and upper back regions. Patient continues to have severe pain despite good conservative therapies such as formal physical therapy, home exercise regimen, rest and use of medications. Patients clinical presentation and exam are complex, differentials include facet mediated pain vs myofascial pain syndrome. I also feel her fibromyalgia is working to exacerbate her symptoms. Myofascial tenderness noted to bilateral thoracic and lumbar regions upon palpation today. She has mild pain with lumbar  extension today. Her lower does not generally bother her during the day, severe pain when laying down at night to sleep. We discussed treatment plan in detail today. Trinity with [employer] is in the office today to perform re-programming as well, she tolerated without difficulty. At this point, would not recommend interventional procedures as medial branch blocks/RFA made her pain worse. I would like to send her back to Dr.  Lydia Kelly for re-evaluation and to discuss chronic pain management. I will go ahead and place this referral today. She has no questions at this time. No red flag symptoms noted upon exam today.     Meds & Orders: No orders of the defined types were placed in this encounter.  No orders of the defined types were placed in this encounter.   Follow-up: Return if symptoms worsen or fail to improve.   Procedures: No procedures performed      Clinical History: CLINICAL DATA:  Low back pain with symptoms persisting over 6 weeks. Myelopathy, acute, lumbar   EXAM: MRI LUMBAR SPINE WITHOUT CONTRAST   TECHNIQUE: Multiplanar, multisequence MR imaging of the lumbar spine was performed. No intravenous contrast was administered.   COMPARISON:  02/06/2020   FINDINGS: Segmentation:  5 lumbar type vertebrae as previously established   Alignment: Degenerative grade 1 anterolisthesis at L4-5, facet mediated   Vertebrae: Unchanged heterogeneity of marrow with benign appearance. No fracture, discitis, or aggressive bone lesion   Conus medullaris and cauda equina: Conus extends to the L1-2 level. Conus and cauda equina appear normal.   Paraspinal and other soft tissues: Fatty atrophy of intrinsic back muscles   Disc levels:   L2-L3: Disc narrowing and mild bulging with small right inferior foraminal protrusion. Mild endplate spurring. Mild facet spurring   L3-L4: Mild facet spurring and foraminal disc bulging   L4-L5: Degenerative facet spurring with anterolisthesis. Mild disc space narrowing. Patent canal and foramina. Left subarticular recess narrowing is mild and does not appear compressive.   L5-S1:Mild or moderate degenerative facet spurring. Small right paracentral annular fissure. No impingement.   IMPRESSION: 1. Stable compared to 2021. 2. Lumbar spine degeneration especially affecting the facets at L4-5 with anterolisthesis. No compressive stenosis.     Electronically  Signed   By: Monique Kelly M.D.   On: 02/15/2022 07:54   She reports that she has never smoked. She has never used smokeless tobacco.  Recent Labs    06/29/23 1539  HGBA1C 5.4    Objective:  VS:  HT:    WT:   BMI:     BP:   HR: bpm  TEMP: ( )  RESP:  Physical Exam Vitals and nursing note reviewed.  HENT:     Head: Normocephalic and atraumatic.     Right Ear: External ear normal.     Left Ear: External ear normal.     Nose: Nose normal.     Mouth/Throat:     Mouth: Mucous membranes are moist.  Eyes:     Extraocular Movements: Extraocular movements intact.  Cardiovascular:     Rate and Rhythm: Normal rate.     Pulses: Normal pulses.  Pulmonary:     Effort: Pulmonary effort is normal.  Abdominal:     General: Abdomen is flat. There is no distension.  Musculoskeletal:        General: Tenderness present.     Cervical back: Normal range of motion.     Comments: Patient rises from seated position to standing without difficulty. Mild pain  with facet loading and lumbar extension. 5/5 strength noted with bilateral hip flexion, knee flexion/extension, ankle dorsiflexion/plantarflexion and EHL. No clonus noted bilaterally. No pain upon palpation of greater trochanters. No pain with internal/external rotation of bilateral hips. Sensation intact bilaterally. Myofascial tenderness noted to bilateral thoracic and lumbar paraspinal regions upon palpation. Negative slump test bilaterally. Ambulates without aid, gait steady.     Skin:    General: Skin is warm and dry.     Capillary Refill: Capillary refill takes less than 2 seconds.  Neurological:     General: No focal deficit present.     Mental Status: She is alert and oriented to person, place, and time.  Psychiatric:        Mood and Affect: Mood normal.        Behavior: Behavior normal.     Ortho Exam  Imaging: No results found.  Past Medical/Family/Surgical/Social History: Medications & Allergies reviewed per EMR, new  medications updated. Patient Active Problem List   Diagnosis Date Noted   Presence of heart assist device (HCC) 10/30/2023   Encounter for Medicare annual wellness exam 04/05/2023   Rotator cuff impingement syndrome of left shoulder 10/19/2022   Impingement syndrome of left shoulder 05/11/2022   Disorder of left rotator cuff 03/27/2022   Back pain of lumbar region with sciatica 01/31/2022   Anemia of chronic disease 01/31/2022   Trochanteric bursitis of right hip 12/09/2021   Chronic pain syndrome 12/06/2021   Lumbar facet arthropathy 11/03/2021   Gastroesophageal reflux disease without esophagitis 10/30/2021   Aortic atherosclerosis (HCC) 10/30/2021   Orthostatic hypotension 08/04/2021   Myalgia due to statin 03/25/2021   Fibromyalgia 03/19/2021   Mixed hyperlipidemia 02/02/2021   Iron deficiency anemia 01/21/2021    Class: Diagnosis of   Lumbar radiculopathy 04/23/2020   Peripheral polyneuropathy 04/23/2020   Cervical radiculitis 12/30/2019   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   Foraminal stenosis of cervical region 12/30/2019   Osteopenia after menopause 10/13/2017   Breast cancer of upper-outer quadrant of right female breast (HCC) 05/30/2017   Hypothyroidism (acquired) 09/29/2016   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 Jolly Needle. Cardiac ablation, loop recorder (removed in 2022)   Atrial fibrillation with rapid ventricular response (HCC) 04/2015   Breast cancer (HCC)    right lumpectomy   BRONCHITIS  03/10/2009   Qualifier: Diagnosis of  By: Ethelle Herb MD, Johnita Nails    Chest pain with moderate risk for cardiac etiology 04/28/2015   Dehydration 06/18/2009   Qualifier: Diagnosis of  By: Merita Staples     Depression    Fall 07/20/2021   Family history of breast cancer    Family history of colon cancer    Fibromyalgia 2010   Hyperlipidemia    Hypothyroidism (acquired) 2006   s/p thyroidectomy for nodules   Morbid obesity (HCC) 08/10/2015   Obesity    gastric bpg 2011, weight then 400 lbs, lost 100 lbs in 60 days.   Obstructive sleep apnea    does not use cpap, does not have a following  physician   Paroxysmal atrial fibrillation (HCC) 08/10/2015   Family History  Problem Relation Age of Onset   Heart failure Father    Cancer Father        colon   Heart failure Maternal Grandmother    Heart failure Maternal Grandfather    Cancer Mother        breast   Past Surgical History:  Procedure Laterality Date   ABDOMINAL HYSTERECTOMY  1992   total.    APPENDECTOMY     BREAST BIOPSY Left 08/04/2022   MM LT BREAST BX W LOC DEV 1ST LESION IMAGE BX SPEC STEREO GUIDE 08/04/2022 [facility]   BREAST BIOPSY Left 08/12/2022   MM LT BREAST BX W LOC DEV 1ST LESION IMAGE BX SPEC STEREO GUIDE 08/12/2022 [facility]   BREAST SURGERY  2019   right lumpectomy    CHOLECYSTECTOMY     COLONOSCOPY     ELECTROPHYSIOLOGIC STUDY N/A 09/08/2015   Procedure: Atrial Fibrillation Ablation;  Surgeon: Jolly Needle, MD;  Location: [facility];  Service: Cardiovascular;  Laterality: N/A;   EP IMPLANTABLE DEVICE N/A 12/17/2015   Procedure: Loop Recorder Insertion;  Surgeon: Jolly Needle, MD;  Location: [facility];  Service: Cardiovascular;  Laterality: N/A;   ESOPHAGOGASTRODUODENOSCOPY     EXPLORATORY LAPAROTOMY WITH ABDOMINAL MASS EXCISION  2018   GASTRIC BYPASS  2011   lost 100 lbs, but gained some back.   implantable loop recorder removal  03/25/2020   removed 2022   NECK  SURGERY  2005   laminectomy?   THYROIDECTOMY  2006   for nodules ( hot and cold nodules) Goiter compressing trachea.   Social History   Occupational History   Occupation: Economist  Tobacco Use   Smoking status: Never   Smokeless tobacco: Never  Vaping Use   Vaping status: Never Used  Substance and Sexual Activity   Alcohol use: Yes    Alcohol/week: 1.0 standard drink of alcohol    Types: 1 Standard drinks or equivalent per week    Comment: rare glass of wine   Drug use: No   Sexual activity: Not Currently

## 2023-10-30 NOTE — Assessment & Plan Note (Signed)
 Chronic back pain. Going for [facility] today. Patient to discuss with [facility] physician if they will manage pain medicines. I recently gave her a short course of oxycodone 5 mg which she took twice daily and it helped her.

## 2023-10-30 NOTE — Progress Notes (Signed)
 Subjective:  Patient ID: Monique Kelly, female    DOB: 10-01-1955  Age: 68 y.o. MRN: [id number]  Chief Complaint  Patient presents with   Medical Management of Chronic Issues    HPI: GERD: Taking omeprazole  40 mg daily   A. FIB: Taking Metoprolol  25 mg daily as needed. Eliquis  5 mg twice daily. On lasix  40 mg daily. Take an extra if her weight increases by 3 lbs in one day. Echo scheduled. Bnp 409.   Hypothyroidism: Takes synthroid  75 mcg daily.   Hyperlipidemia: on repatha  140 mg IM every 2 weeks.   Depression well-controlled on wellbutrin  450 mg daily   Lumbar back pain: spinal ablation did not help. Patient got her spine stimulator in June 2024 and significantly helped.  Patient is taking gabapentin  600 mg 3 in a.m. and 1 in PM as needed. Going to pain clinic after today appt.  Breast cancer: on letrozole 2.5 mg daily.  On reclast  IV annually through Dr. Almer Jacobson.      10/30/2023    1:32 PM 06/29/2023    2:05 PM 04/04/2023    2:19 PM 01/24/2023   11:21 AM 10/19/2022    2:44 PM  Depression screen PHQ 2/9  Decreased Interest 1 1 2  0 1  Down, Depressed, Hopeless 1 1 2 1 1   PHQ - 2 Score 2 2 4 1 2   Altered sleeping 2 2 2 3 3   Tired, decreased energy 2 2 2 3 3   Change in appetite  0 0 0 1  Feeling bad or failure about yourself  0 0 0 0 0  Trouble concentrating 0 0 0 1 2  Moving slowly or fidgety/restless 0 0 0 0 0  Suicidal thoughts 0 0 0 0 0  PHQ-9 Score 6 6 8 8 11   Difficult doing work/chores Not difficult at all Somewhat difficult Not difficult at all Not difficult at all Not difficult at all        06/29/2023    2:04 PM  Fall Risk   Falls in the past year? 1  Number falls in past yr: 0  Injury with Fall? 0  Risk for fall due to : No Fall Risks  Follow up Falls evaluation completed    Patient Care Team: Mercy Stall, MD as PCP - General (Family Medicine) Lei Pump, MD as PCP - Electrophysiology (Cardiology) Manfred Seed, MD as PCP - Cardiology  (Cardiology) Nolia Baumgartner, MD as Consulting Physician (Oncology) Elzie Handler, MD as Consulting Physician (General Surgery) Misenheimer, Emeterio Hansen, MD as Consulting Physician (Unknown Physician Specialty) Kalman Ores, DDS as Referring Physician (Orthodontics) Lininger, Abe Hodgkins., MD as Referring Physician (Surgery)   Review of Systems  Constitutional:  Negative for chills, fatigue and fever.  HENT:  Negative for congestion, ear pain, rhinorrhea and sore throat.   Respiratory:  Negative for cough and shortness of breath.   Cardiovascular:  Negative for chest pain.  Gastrointestinal:  Negative for abdominal pain, constipation, diarrhea, nausea and vomiting.  Genitourinary:  Negative for dysuria and urgency.  Musculoskeletal:  Positive for arthralgias and back pain. Negative for myalgias.  Neurological:  Negative for dizziness, weakness, light-headedness and headaches.  Psychiatric/Behavioral:  Negative for dysphoric mood. The patient is not nervous/anxious.     Current Outpatient Medications on File Prior to Visit  Medication Sig Dispense Refill   buPROPion  (WELLBUTRIN  XL) 150 MG 24 hr tablet Take 450 mg by mouth daily.     buPROPion  (WELLBUTRIN  XL) 300 MG 24  hr tablet Take 1 tablet (300 mg total) by mouth daily. 90 tablet 0   cyclobenzaprine  (FLEXERIL ) 5 MG tablet Take 1-2 tablets (5-10 mg total) by mouth at bedtime. Start with 5 mg nightly x 1 week, if no side effects and need can increase to 10mg  nightly 30 tablet 0   diltiazem  (CARDIZEM  CD) 120 MG 24 hr capsule TAKE ONE CAPSULE BY MOUTH DAILY 90 capsule 0   ELIQUIS  5 MG TABS tablet TAKE ONE TABLET BY MOUTH TWICE DAILY 60 tablet 2   Evolocumab  (REPATHA  SURECLICK) 140 MG/ML SOAJ Inject 140 mg into the skin every 14 (fourteen) days. 2 mL 12   furosemide  (LASIX ) 40 MG tablet Take 1 tablet (40 mg total) by mouth daily. 90 tablet 2   gabapentin  (NEURONTIN ) 600 MG tablet Take 1 tablet (600 mg total) by mouth 4 (four) times daily. 360  tablet 1   HYDROcodone -acetaminophen  (NORCO) 10-325 MG tablet Take 1 tablet by mouth every 6 (six) hours as needed.     letrozole (FEMARA) 2.5 MG tablet TAKE ONE TABLET BY MOUTH EVERY DAY 90 tablet 3   levothyroxine  (SYNTHROID ) 75 MCG tablet TAKE ONE TABLET BY MOUTH DAILY 90 tablet 1   omeprazole  (PRILOSEC) 40 MG capsule Take 1 capsule (40 mg total) by mouth in the morning and at bedtime. 180 capsule 3   potassium chloride  SA (KLOR-CON  M20) 20 MEQ tablet Take 1 tablet (20 mEq total) by mouth daily. 90 tablet 2   Zoledronic  Acid (RECLAST  IV) Inject into the vein.     No current facility-administered medications on file prior to visit.   Past Medical History:  Diagnosis Date   A-fib (HCC) 09/08/2015   Atrial fibrillation (HCC) 2016   Sees Dr. Jolly Needle. Cardiac ablation, loop recorder (removed in 2022)   Atrial fibrillation with rapid ventricular response (HCC) 04/2015   Breast cancer (HCC)    right lumpectomy   BRONCHITIS 03/10/2009   Qualifier: Diagnosis of  By: Ethelle Herb MD, Johnita Nails    Chest pain with moderate risk for cardiac etiology 04/28/2015   Dehydration 06/18/2009   Qualifier: Diagnosis of  By: Merita Staples     Depression    Fall 07/20/2021   Family history of breast cancer    Family history of colon cancer    Fibromyalgia 2010   Hyperlipidemia    Hypothyroidism (acquired) 2006   s/p thyroidectomy for nodules   Morbid obesity (HCC) 08/10/2015   Obesity    gastric bpg 2011, weight then 400 lbs, lost 100 lbs in 60 days.   Obstructive sleep apnea    does not use cpap, does not have a following physician   Paroxysmal atrial fibrillation (HCC) 08/10/2015   Past Surgical History:  Procedure Laterality Date   ABDOMINAL HYSTERECTOMY  1992   total.    APPENDECTOMY     BREAST BIOPSY Left 08/04/2022   MM LT BREAST BX W LOC DEV 1ST LESION IMAGE BX SPEC STEREO GUIDE 08/04/2022 [facility]   BREAST BIOPSY Left 08/12/2022   MM LT BREAST BX W LOC DEV 1ST LESION IMAGE  BX SPEC STEREO GUIDE 08/12/2022 [facility]   BREAST SURGERY  2019   right lumpectomy    CHOLECYSTECTOMY     COLONOSCOPY     ELECTROPHYSIOLOGIC STUDY N/A 09/08/2015   Procedure: Atrial Fibrillation Ablation;  Surgeon: Jolly Needle, MD;  Location: [facility] INVASIVE CV LAB;  Service: Cardiovascular;  Laterality: N/A;   EP IMPLANTABLE DEVICE N/A 12/17/2015   Procedure: Loop Recorder Insertion;  Surgeon: Jolly Needle, MD;  Location: [facility] INVASIVE CV LAB;  Service: Cardiovascular;  Laterality: N/A;   ESOPHAGOGASTRODUODENOSCOPY     EXPLORATORY LAPAROTOMY WITH ABDOMINAL MASS EXCISION  2018   GASTRIC BYPASS  2011   lost 100 lbs, but gained some back.   implantable loop recorder removal  03/25/2020   removed 2022   NECK SURGERY  2005   laminectomy?   THYROIDECTOMY  2006   for nodules ( hot and cold nodules) Goiter compressing trachea.    Family History  Problem Relation Age of Onset   Heart failure Father    Cancer Father        colon   Heart failure Maternal Grandmother    Heart failure Maternal Grandfather    Cancer Mother        breast   Social History   Socioeconomic History   Marital status: Single    Spouse name: Not on file   Number of children: Not on file   Years of education: Not on file   Highest education level: Bachelor's degree (e.g., BA, AB, BS)  Occupational History   Occupation: Economist  Tobacco Use   Smoking status: Never   Smokeless tobacco: Never  Vaping Use   Vaping status: Never Used  Substance and Sexual Activity   Alcohol use: Yes    Alcohol/week: 1.0 standard drink of alcohol    Types: 1 Standard drinks or equivalent per week    Comment: rare glass of wine   Drug use: No   Sexual activity: Not Currently  Other Topics Concern   Not on file  Social History Narrative   Pt lives in [place] alone. 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   Social Drivers of Health   Financial Resource Strain: Medium Risk  (06/27/2023)   Overall Financial Resource Strain (CARDIA)    Difficulty of Paying Living Expenses: Somewhat hard  Food Insecurity: No Food Insecurity (06/27/2023)   Hunger Vital Sign    Worried About Running Out of Food in the Last Year: Never true    Ran Out of Food in the Last Year: Never true  Transportation Needs: No Transportation Needs (06/27/2023)   PRAPARE - Administrator, Civil Service (Medical): No    Lack of Transportation (Non-Medical): No  Physical Activity: Inactive (06/27/2023)   Exercise Vital Sign    Days of Exercise per Week: 0 days    Minutes of Exercise per Session: 0 min  Stress: No Stress Concern Present (06/27/2023)   [employer] of Occupational Health - Occupational Stress Questionnaire    Feeling of Stress : Only a little  Social Connections: Socially Isolated (06/27/2023)   Social Connection and Isolation Panel [NHANES]    Frequency of Communication with Friends and Family: More than three times a week    Frequency of Social Gatherings with Friends and Family: Twice a week    Attends Religious Services: Never    Diplomatic Services operational officer: No    Attends Engineer, structural: Never    Marital Status: Never married    Objective:  BP 110/70   Pulse 75   Temp 98 F (36.7 C)   Ht 5\' 7"  (1.702 m)   Wt 227 lb (103 kg)   SpO2 97%   BMI 35.55 kg/m      10/30/2023    1:29 PM 10/25/2023    2:20 PM 10/02/2023   11:56 AM  BP/Weight  Systolic  BP 110 140 110  Diastolic BP 70 90 70  Wt. (Lbs) 227 227 235  BMI 35.55 kg/m2 35.55 kg/m2 36.81 kg/m2    Physical Exam Vitals reviewed.  Constitutional:      Appearance: Normal appearance. She is obese.  Neck:     Vascular: No carotid bruit.  Cardiovascular:     Rate and Rhythm: Normal rate. Rhythm irregular.     Heart sounds: Normal heart sounds.  Pulmonary:     Effort: Pulmonary effort is normal. No respiratory distress.     Breath sounds: Normal breath sounds.  Abdominal:      General: Abdomen is flat. Bowel sounds are normal.     Palpations: Abdomen is soft.     Tenderness: There is no abdominal tenderness.  Musculoskeletal:     Right lower leg: Edema present.     Left lower leg: Edema present.  Skin:    Findings: Lesion (scabs posterior right shoulder from scratching.) present.  Neurological:     Mental Status: She is alert and oriented to person, place, and time.  Psychiatric:        Mood and Affect: Mood normal.        Behavior: Behavior normal.     Diabetic Foot Exam - Simple   Simple Foot Form Diabetic Foot exam was performed with the following findings: Yes 10/30/2023  2:13 PM  Visual Inspection No deformities, no ulcerations, no other skin breakdown bilaterally: Yes Sensation Testing Intact to touch and monofilament testing bilaterally: Yes Pulse Check Posterior Tibialis and Dorsalis pulse intact bilaterally: Yes Comments      Lab Results  Component Value Date   WBC 7.2 10/25/2023   HGB 13.9 10/25/2023   HCT 42.6 10/25/2023   PLT 252 10/25/2023   GLUCOSE 96 10/25/2023   CHOL 126 06/29/2023   TRIG 97 06/29/2023   HDL 59 06/29/2023   LDLCALC 49 06/29/2023   ALT 9 10/25/2023   AST 14 10/25/2023   NA 141 10/25/2023   K 3.6 10/25/2023   CL 100 10/25/2023   CREATININE 1.03 (H) 10/25/2023   BUN 18 10/25/2023   CO2 27 10/25/2023   TSH 1.380 08/11/2022   HGBA1C 5.4 06/29/2023      Assessment & Plan:  Hypothyroidism (acquired) Assessment & Plan: Previously well controlled Continue Synthroid  at current dose     Presence of heart assist device (HCC)  Morbid obesity (HCC) Assessment & Plan: BMI 34. Improving.  Recommend continue to work on eating healthy diet and exercise. Comorbidities: hyperlipidemia   Paroxysmal atrial fibrillation (HCC) Assessment & Plan: Stable. Continue eliquis . Management per specialist.     Malignant neoplasm of upper-outer quadrant of right breast in female, estrogen receptor positive  [facility]) Assessment & Plan: History of stage IIA hormone receptor positive right breast cancer diagnosed in January 2019.  Continue adjuvant hormonal therapy with letrozole 2.5 mg daily until 2029 due to high risk for recurrence. Management per specialist.         Moderate recurrent major depression (HCC) Assessment & Plan: Patient reports difficulty managing depression due to chronic medical problems. -Continue Wellbutrin  450mg .   Mixed hyperlipidemia Assessment & Plan: Well controlled.  No changes to medicines. Continue repatha  Continue to work on eating a healthy diet and exercise.      Myalgia due to statin Assessment & Plan: Intolerant to statins.    Aortic atherosclerosis (HCC) Assessment & Plan: Continue repatha .    Chronic pain syndrome Assessment & Plan: Chronic back pain. Going for [facility]  today. Patient to discuss with [facility] orthopedic physician if they will manage pain medicines. I recently gave her a short course of oxycodone 5 mg which she took twice daily and it helped her.       No orders of the defined types were placed in this encounter.   No orders of the defined types were placed in this encounter.    Follow-up: Return in about 3 months (around 01/30/2024) for chronic follow up.  Total time spent on today's visit was 30 minutes, including both face-to-face time and nonface-to-face time personally spent on review of chart (labs and imaging), discussing labs and goals, discussing further work-up, treatment options, referrals to specialist if needed, reviewing outside records of pertinent, answering patient's questions, and coordinating care.  I,Marla I Leal-Borjas,acting as a scribe for Mercy Stall, MD.,have documented all relevant documentation on the behalf of Mercy Stall, MD,as directed by  Mercy Stall, MD while in the presence of Mercy Stall, MD.   An After Visit Summary was printed and given to the patient.  I attest that I have reviewed this  visit and agree with the plan scribed by my staff.   Mercy Stall, MD [facility] [phone]

## 2023-10-30 NOTE — Assessment & Plan Note (Signed)
 Stable. Continue eliquis . Management per specialist.

## 2023-10-30 NOTE — Assessment & Plan Note (Addendum)
 BMI 34. Improving.  Recommend continue to work on eating healthy diet and exercise. Comorbidities: hyperlipidemia

## 2023-10-30 NOTE — Assessment & Plan Note (Signed)
 Intolerant to statins.

## 2023-10-30 NOTE — Assessment & Plan Note (Signed)
 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  Continue adjuvant hormonal therapy with letrozole 2.5 mg daily until 2029 due to high risk for recurrence. Management per specialist.

## 2023-10-30 NOTE — Assessment & Plan Note (Addendum)
 Patient reports difficulty managing depression due to chronic medical problems. -Continue Wellbutrin  450mg .

## 2023-10-30 NOTE — Telephone Encounter (Signed)
 Spoke with patient, notified of results and recommendation per Vernestine Gondola. Medication sent to [facility] per patient request.

## 2023-10-30 NOTE — Assessment & Plan Note (Signed)
Well controlled.  No changes to medicines. Continue repatha Continue to work on eating a healthy diet and exercise.

## 2023-10-30 NOTE — Assessment & Plan Note (Signed)
Continue repatha.

## 2023-11-05 ENCOUNTER — Other Ambulatory Visit: Payer: Self-pay | Admitting: Family Medicine

## 2023-11-05 ENCOUNTER — Encounter: Payer: Self-pay | Admitting: Family Medicine

## 2023-11-05 MED ORDER — BUPRENORPHINE 5 MCG/HR TD PTWK: 4.0000 | MEDICATED_PATCH | TRANSDERMAL | 0 refills | Status: DC

## 2023-11-24 ENCOUNTER — Other Ambulatory Visit: Payer: Self-pay

## 2023-11-25 ENCOUNTER — Other Ambulatory Visit: Payer: Self-pay | Admitting: Family Medicine

## 2023-11-27 ENCOUNTER — Ambulatory Visit (INDEPENDENT_AMBULATORY_CARE_PROVIDER_SITE_OTHER)

## 2023-11-27 VITALS — BP 90/60 | HR 93 | Temp 97.2°F | Resp 16 | Ht 67.0 in | Wt 224.0 lb

## 2023-11-27 DIAGNOSIS — M961 Postlaminectomy syndrome, not elsewhere classified: Secondary | ICD-10-CM

## 2023-11-27 DIAGNOSIS — M47816 Spondylosis without myelopathy or radiculopathy, lumbar region: Secondary | ICD-10-CM | POA: Diagnosis not present

## 2023-11-27 MED ORDER — BUPRENORPHINE 7.5 MCG/HR TD PTWK: 1.0000 | MEDICATED_PATCH | TRANSDERMAL | 0 refills | Status: DC

## 2023-11-27 NOTE — Patient Instructions (Signed)
  VISIT SUMMARY: Today, we discussed your chronic lower back pain, atrial fibrillation, and post-gastric bypass complications. We adjusted your pain medication and provided recommendations for managing your symptoms and improving your overall health.  YOUR PLAN: CHRONIC LOWER BACK PAIN: You have severe chronic lower back pain that affects your daily activities despite having a spinal stimulator implant. -Increase Butrans  patch to 7.5 mcg weekly. -Encourage home exercises and stretching, including chair yoga and low back exercises for seniors. -Recommend water exercises in a pool. -Advise using small weights for upper body and ankle exercises. -Discuss the importance of gradually increasing exercise duration.  ATRIAL FIBRILLATION: You have a history of atrial fibrillation and are currently taking diltiazem  and Lasix . You experience occasional dizziness and low blood pressure. -Perform echocardiogram to assess heart structure. -Monitor blood pressure and heart rate at home. -Consider adjusting diltiazem  or Lasix  dosage based on echocardiogram results and blood pressure readings. -Follow up with cardiologist in September.  PERIPHERAL EDEMA: You have swelling in your feet, possibly related to your heart condition and medication use. -Elevate your legs with a pillow when resting to reduce swelling.  POST-GASTRIC BYPASS COMPLICATIONS: You have had complications from gastric bypass surgery, including rapid weight loss and difficulty eating without vomiting. -Encourage adequate hydration. -Advise regular physical activity and exercises.                      Contains text generated by Abridge.                                 Contains text generated by Abridge.

## 2023-11-27 NOTE — Assessment & Plan Note (Signed)
 Severe chronic lower back pain persists despite a spinal stimulator implant. 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 The Butrans  opioid patch (5 mcg) has improved her ability to stand and walk in the morning, but she struggles with daily activities. Increasing the patch dosage to 7.5 mcg is considered to further improve functionality. Emphasized the importance of physical therapy and exercises to improve muscle strength and function, as pain control alone is insufficient. - Increase Butrans  patch to 7.5 mcg weekly - Encourage home exercises and stretching, including chair yoga and low back exercises for seniors - Recommend water exercises in a pool - Advise using small weights for upper body and ankle exercises - Discuss the importance of gradual increase in exercise duration

## 2023-11-27 NOTE — Progress Notes (Signed)
 Subjective:  Patient ID: Monique Kelly, female    DOB: 1956-05-16  Age: 68 y.o. MRN: [id number]  Chief Complaint  Patient presents with   Back Pain    HPI: Discussed the use of AI scribe software for clinical note transcription with the patient, who gave verbal consent to proceed.  History of Present Illness   Monique Kelly is a 68 year old female with chronic lower back pain and atrial fibrillation who presents for follow-up on pain management and medication adjustment.  Chronic lower back pain - Severe lower back pain, particularly at night and in the morning - Pain is severe enough to wake her from sleep and prevent her from standing up straight in the morning - Requires several hours in a recliner with a heating pad before she can shower and dress - Significant impact on ability to perform daily activities - Spinal stimulator implanted one year ago with persistent symptoms  Lower extremity pain - Sharp, bone-like pain in the legs from knees to ankles at night - Pain is distinct from muscle pain, which is described as aching and throbbing  Opioid analgesic use - Using a 5 microgram opioid patch for the past month - Improved ability to stand up straight and walk, though still ambulates slowly - Careful adherence to patch instructions - Good improvement in condition, but ongoing difficulty with some activities  Atrial fibrillation and cardiovascular symptoms - History of atrial fibrillation - Currently taking diltiazem  120 mg daily and Lasix  40 mg daily - Swelling in feet - Scheduled for echocardiogram to assess for structural heart changes - Concern about low blood pressure, attributed to diltiazem  and Lasix  - Occasional dizziness with positional changes or walking  Post-gastric bypass complications - History of gastric bypass surgery at age 2 with significant complications, including rapid weight loss and difficulty walking - Ongoing dietary issues,  including anemia and difficulty keeping food down - Persistent thirst         11/27/2023    1:28 PM 10/30/2023    1:32 PM 06/29/2023    2:05 PM 04/04/2023    2:19 PM 01/24/2023   11:21 AM  Depression screen PHQ 2/9  Decreased Interest 1 1 1 2  0  Down, Depressed, Hopeless 1 1 1 2 1   PHQ - 2 Score 2 2 2 4 1   Altered sleeping 1 2 2 2 3   Tired, decreased energy 3 2 2 2 3   Change in appetite 0  0 0 0  Feeling bad or failure about yourself  0 0 0 0 0  Trouble concentrating 2 0 0 0 1  Moving slowly or fidgety/restless 0 0 0 0 0  Suicidal thoughts 0 0 0 0 0  PHQ-9 Score 8 6 6 8 8   Difficult doing work/chores Somewhat difficult Not difficult at all Somewhat difficult Not difficult at all Not difficult at all        11/27/2023    1:27 PM  Fall Risk   Falls in the past year? 1  Number falls in past yr: 0  Injury with Fall? 0  Risk for fall due to : History of fall(s)  Follow up Education provided    Patient Care Team: Sherre Clapper, MD as PCP - General (Family Medicine) Inocencio Soyla Lunger, MD as PCP - Electrophysiology (Cardiology) Bernie Lamar PARAS, MD as PCP - Cardiology (Cardiology) Cornelius Wanda DEL, MD as Consulting Physician (Oncology) Joesph Lynwood DEL, MD as Consulting Physician (General Surgery) Misenheimer, Evalene, MD as  Consulting Physician (Unknown Physician Specialty) Micky Anes, DDS as Referring Physician (Orthodontics) Lininger, Ozell BIRCH., MD as Referring Physician (Surgery)   Review of Systems  Musculoskeletal:  Positive for arthralgias and back pain.  All other systems reviewed and are negative.   Current Outpatient Medications on File Prior to Visit  Medication Sig Dispense Refill   buPROPion  (WELLBUTRIN  XL) 150 MG 24 hr tablet Take 450 mg by mouth daily.     buPROPion  (WELLBUTRIN  XL) 300 MG 24 hr tablet TAKE ONE TABLET BY MOUTH DAILY 90 tablet 0   cyclobenzaprine  (FLEXERIL ) 5 MG tablet Take 1-2 tablets (5-10 mg total) by mouth at bedtime. Start with 5 mg  nightly x 1 week, if no side effects and need can increase to 10mg  nightly 30 tablet 0   diltiazem  (CARDIZEM  CD) 120 MG 24 hr capsule TAKE ONE CAPSULE BY MOUTH DAILY 90 capsule 0   ELIQUIS  5 MG TABS tablet TAKE ONE TABLET BY MOUTH TWICE DAILY 60 tablet 2   Evolocumab  (REPATHA  SURECLICK) 140 MG/ML SOAJ Inject 140 mg into the skin every 14 (fourteen) days. 2 mL 12   furosemide  (LASIX ) 40 MG tablet Take 1 tablet (40 mg total) by mouth daily. 90 tablet 2   gabapentin  (NEURONTIN ) 600 MG tablet Take 1 tablet (600 mg total) by mouth 4 (four) times daily. 360 tablet 1   letrozole (FEMARA) 2.5 MG tablet TAKE ONE TABLET BY MOUTH EVERY DAY 90 tablet 3   levothyroxine  (SYNTHROID ) 75 MCG tablet TAKE ONE TABLET BY MOUTH DAILY 90 tablet 1   omeprazole  (PRILOSEC) 40 MG capsule Take 1 capsule (40 mg total) by mouth in the morning and at bedtime. 180 capsule 3   potassium chloride  SA (KLOR-CON  M20) 20 MEQ tablet Take 1 tablet (20 mEq total) by mouth daily. 90 tablet 2   Zoledronic  Acid (RECLAST  IV) Inject into the vein.     No current facility-administered medications on file prior to visit.   Past Medical History:  Diagnosis Date   A-fib (HCC) 09/08/2015   Atrial fibrillation (HCC) 2016   Sees Dr. Lynwood Rakers. Cardiac ablation, loop recorder (removed in 2022)   Atrial fibrillation with rapid ventricular response (HCC) 04/2015   Breast cancer (HCC)    right lumpectomy   BRONCHITIS 03/10/2009   Qualifier: Diagnosis of  By: Perley MD, Ronal Dines    Chest pain with moderate risk for cardiac etiology 04/28/2015   Dehydration 06/18/2009   Qualifier: Diagnosis of  By: Nonnie ROSALEA Iha     Depression    Fall 07/20/2021   Family history of breast cancer    Family history of colon cancer    Fibromyalgia 2010   Hyperlipidemia    Hypothyroidism (acquired) 2006   s/p thyroidectomy for nodules   Morbid obesity (HCC) 08/10/2015   Obesity    gastric bpg 2011, weight then 400 lbs, lost 100 lbs in 60 days.    Obstructive sleep apnea    does not use cpap, does not have a following physician   Paroxysmal atrial fibrillation (HCC) 08/10/2015   Past Surgical History:  Procedure Laterality Date   ABDOMINAL HYSTERECTOMY  1992   total.    APPENDECTOMY     BREAST BIOPSY Left 08/04/2022   MM LT BREAST BX W LOC DEV 1ST LESION IMAGE BX SPEC STEREO GUIDE 08/04/2022 [facility]   BREAST BIOPSY Left 08/12/2022   MM LT BREAST BX W LOC DEV 1ST LESION IMAGE BX SPEC STEREO GUIDE 08/12/2022 [facility]   BREAST  SURGERY  2019   right lumpectomy    CHOLECYSTECTOMY     COLONOSCOPY     ELECTROPHYSIOLOGIC STUDY N/A 09/08/2015   Procedure: Atrial Fibrillation Ablation;  Surgeon: Lynwood Rakers, MD;  Location: [facility] INVASIVE CV LAB;  Service: Cardiovascular;  Laterality: N/A;   EP IMPLANTABLE DEVICE N/A 12/17/2015   Procedure: Loop Recorder Insertion;  Surgeon: Lynwood Rakers, MD;  Location: [facility] INVASIVE CV LAB;  Service: Cardiovascular;  Laterality: N/A;   ESOPHAGOGASTRODUODENOSCOPY     EXPLORATORY LAPAROTOMY WITH ABDOMINAL MASS EXCISION  2018   GASTRIC BYPASS  2011   lost 100 lbs, but gained some back.   implantable loop recorder removal  03/25/2020   removed 2022   NECK SURGERY  2005   laminectomy?   THYROIDECTOMY  2006   for nodules ( hot and cold nodules) Goiter compressing trachea.    Family History  Problem Relation Age of Onset   Heart failure Father    Cancer Father        colon   Heart failure Maternal Grandmother    Heart failure Maternal Grandfather    Cancer Mother        breast   Social History   Socioeconomic History   Marital status: Single    Spouse name: Not on file   Number of children: Not on file   Years of education: Not on file   Highest education level: Bachelor's degree (e.g., BA, AB, BS)  Occupational History   Occupation: Economist  Tobacco Use   Smoking status: Never   Smokeless tobacco: Never  Vaping Use   Vaping status: Never Used  Substance and  Sexual Activity   Alcohol use: Yes    Alcohol/week: 1.0 standard drink of alcohol    Types: 1 Standard drinks or equivalent per week    Comment: rare glass of wine   Drug use: No   Sexual activity: Not Currently  Other Topics Concern   Not on file  Social History Narrative   Pt lives in [place] alone. 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   Social Drivers of Health   Financial Resource Strain: Medium Risk (11/20/2023)   Overall Financial Resource Strain (CARDIA)    Difficulty of Paying Living Expenses: Somewhat hard  Food Insecurity: Food Insecurity Present (11/20/2023)   Hunger Vital Sign    Worried About Running Out of Food in the Last Year: Sometimes true    Ran Out of Food in the Last Year: Sometimes true  Transportation Needs: No Transportation Needs (11/20/2023)   PRAPARE - Administrator, Civil Service (Medical): No    Lack of Transportation (Non-Medical): No  Physical Activity: Inactive (11/20/2023)   Exercise Vital Sign    Days of Exercise per Week: 0 days    Minutes of Exercise per Session: Not on file  Stress: Stress Concern Present (11/20/2023)   [employer] of Occupational Health - Occupational Stress Questionnaire    Feeling of Stress: To some extent  Social Connections: Socially Isolated (11/20/2023)   Social Connection and Isolation Panel    Frequency of Communication with Friends and Family: More than three times a week    Frequency of Social Gatherings with Friends and Family: Not on file    Attends Religious Services: Never    Database administrator or Organizations: No    Attends Engineer, structural: Not on file    Marital Status: Never married    Objective:  BP  90/60   Pulse 93   Temp (!) 97.2 F (36.2 C)   Resp 16   Ht 5' 7 (1.702 m)   Wt 224 lb (101.6 kg)   SpO2 97%   BMI 35.08 kg/m      11/27/2023    1:29 PM 11/27/2023    1:18 PM 10/30/2023    1:29 PM  BP/Weight  Systolic BP 90 80 110   Diastolic BP 60 40 70  Wt. (Lbs)  224 227  BMI  35.08 kg/m2 35.55 kg/m2    Physical Exam Vitals and nursing note reviewed.  Constitutional:      Appearance: She is obese.  HENT:     Head: Normocephalic and atraumatic.  Cardiovascular:     Rate and Rhythm: Normal rate and regular rhythm.  Pulmonary:     Effort: Pulmonary effort is normal.     Breath sounds: Normal breath sounds.  Musculoskeletal:        General: Tenderness (low back) present.     Comments: Spinal stimulator noted in the right low back  Neurological:     General: No focal deficit present.     Mental Status: She is alert and oriented to person, place, and time.  Psychiatric:        Mood and Affect: Mood normal.        Behavior: Behavior normal.         Lab Results  Component Value Date   WBC 7.2 10/25/2023   HGB 13.9 10/25/2023   HCT 42.6 10/25/2023   PLT 252 10/25/2023   GLUCOSE 96 10/25/2023   CHOL 126 06/29/2023   TRIG 97 06/29/2023   HDL 59 06/29/2023   LDLCALC 49 06/29/2023   ALT 9 10/25/2023   AST 14 10/25/2023   NA 141 10/25/2023   K 3.6 10/25/2023   CL 100 10/25/2023   CREATININE 1.03 (H) 10/25/2023   BUN 18 10/25/2023   CO2 27 10/25/2023   TSH 1.380 08/11/2022   HGBA1C 5.4 06/29/2023      Assessment & Plan:  Lumbar facet arthropathy Assessment & Plan: Severe chronic lower back pain persists despite a spinal stimulator implant. 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 The Butrans  opioid patch (5 mcg) has improved her ability to stand and walk in the morning, but she struggles with daily activities. Increasing the patch dosage to 7.5 mcg is considered to further improve functionality. Emphasized the importance of physical therapy and exercises to improve muscle strength and function, as pain control alone is insufficient. - Increase Butrans  patch to 7.5 mcg weekly - Encourage home exercises and stretching, including chair yoga and  low back exercises for seniors - Recommend water exercises in a pool - Advise using small weights for upper body and ankle exercises - Discuss the importance of gradual increase in exercise duration   Failed back surgical syndrome  Other orders -     Buprenorphine ; Place 1 patch onto the skin once a week.  Dispense: 4 patch; Refill: 0     Meds ordered this encounter  Medications   buprenorphine  (BUTRANS ) 7.5 MCG/HR    Sig: Place 1 patch onto the skin once a week.    Dispense:  4 patch    Refill:  0    Dose increased to 7.5 mcg weekly    No orders of the defined types were placed in this encounter.   Assessment and Plan  Follow-up: Return in about 8 weeks (around 01/22/2024) for chronic disease follow up.   An After Visit Summary was printed and given to the patient.  Eugene Zeiders, MD [facility] [phone]

## 2023-11-28 ENCOUNTER — Ambulatory Visit: Attending: Cardiology

## 2023-11-28 DIAGNOSIS — R0609 Other forms of dyspnea: Secondary | ICD-10-CM | POA: Diagnosis not present

## 2023-11-29 LAB — ECHOCARDIOGRAM COMPLETE
Area-P 1/2: 2.87 cm2
MV M vel: 3.99 m/s
MV Peak grad: 63.7 mmHg
S' Lateral: 3.2 cm

## 2023-12-04 ENCOUNTER — Telehealth: Payer: Self-pay | Admitting: Cardiology

## 2023-12-04 NOTE — Telephone Encounter (Signed)
 Left msg for pt to return call.

## 2023-12-04 NOTE — Telephone Encounter (Signed)
 Left message for the patient to call back.

## 2023-12-04 NOTE — Telephone Encounter (Signed)
Pt calling for echo results  

## 2023-12-04 NOTE — Telephone Encounter (Signed)
 Sent to incorrect pool

## 2023-12-05 ENCOUNTER — Ambulatory Visit

## 2023-12-05 NOTE — Telephone Encounter (Signed)
 Left vm to return call.

## 2023-12-06 NOTE — Telephone Encounter (Signed)
 Left message for the patient to call back.

## 2023-12-23 ENCOUNTER — Other Ambulatory Visit: Payer: Self-pay | Admitting: Family Medicine

## 2023-12-23 DIAGNOSIS — R600 Localized edema: Secondary | ICD-10-CM

## 2023-12-25 ENCOUNTER — Other Ambulatory Visit: Payer: Self-pay | Admitting: Medical Genetics

## 2023-12-30 ENCOUNTER — Other Ambulatory Visit: Payer: Self-pay

## 2024-01-01 ENCOUNTER — Encounter: Payer: Self-pay | Admitting: Family Medicine

## 2024-01-01 ENCOUNTER — Ambulatory Visit: Admitting: Family Medicine

## 2024-01-01 ENCOUNTER — Other Ambulatory Visit: Payer: Self-pay | Admitting: Family Medicine

## 2024-01-01 VITALS — BP 112/70 | HR 55 | Temp 98.1°F | Ht 67.0 in | Wt 237.0 lb

## 2024-01-01 DIAGNOSIS — M545 Low back pain, unspecified: Secondary | ICD-10-CM

## 2024-01-01 DIAGNOSIS — F331 Major depressive disorder, recurrent, moderate: Secondary | ICD-10-CM | POA: Diagnosis not present

## 2024-01-01 DIAGNOSIS — M25561 Pain in right knee: Secondary | ICD-10-CM | POA: Diagnosis not present

## 2024-01-01 DIAGNOSIS — M25562 Pain in left knee: Secondary | ICD-10-CM | POA: Diagnosis not present

## 2024-01-01 DIAGNOSIS — G894 Chronic pain syndrome: Secondary | ICD-10-CM | POA: Diagnosis not present

## 2024-01-01 DIAGNOSIS — G8929 Other chronic pain: Secondary | ICD-10-CM | POA: Insufficient documentation

## 2024-01-01 MED ORDER — BUPRENORPHINE 10 MCG/HR TD PTWK: 1.0000 | MEDICATED_PATCH | TRANSDERMAL | 2 refills | Status: DC

## 2024-01-01 MED ORDER — BUPROPION HCL ER (XL) 150 MG PO TB24: 150.0000 mg | ORAL_TABLET | Freq: Every day | ORAL | 0 refills | Status: DC

## 2024-01-01 NOTE — Progress Notes (Signed)
 Subjective:  Patient ID: Monique Kelly, female    DOB: [birth year]  Age: 68 y.o. MRN: [id number]  Chief Complaint  Patient presents with   Chronic Pain Management   Discussed the use of AI scribe software for clinical note transcription with the patient, who gave verbal consent to proceed.  History of Present Illness   Monique Kelly is a 68 year old female who presents for follow-up regarding her pain management.  Low Back Pain - Currently using Butrans  patch, second dose at 7.5 mg, which is effective for pain management when available - Has been without Butrans  patch for one week, resulting in worsened pain and mobility - Takes two Advil Arthritis PM at night with blood thinner, but finds Advil ineffective  Lower extremity pain - Since back pain better controlled she has developed or become aware of pain in both legs, from knees to ankles, described as a hammering sensation - Pain is most pronounced in the morning - Increased difficulty getting up in the morning without holding onto furniture when Butrans  patch is not used - Advil targeted cream provides effective relief for knees and legs - Pain implant is turned off due to lack of efficacy  Sleep disturbance - Poor sleep quality, frequently wakes due to pain and need to use the bathroom - Sleep apnea present  Psychological symptoms - Increased anxiety and irritability since discontinuing bupropion  one month ago - Attributes some anxiety to recent family stressors, including brother's heart attack and mother's health issues  Edema management - Attempts to elevate feet as much as possible          11/27/2023    1:28 PM 10/30/2023    1:32 PM 06/29/2023    2:05 PM 04/04/2023    2:19 PM 01/24/2023   11:21 AM  Depression screen PHQ 2/9  Decreased Interest 1 1 1 2  0  Down, Depressed, Hopeless 1 1 1 2 1   PHQ - 2 Score 2 2 2 4 1   Altered sleeping 1 2 2 2 3   Tired, decreased energy 3 2 2 2 3   Change in appetite 0   0 0 0  Feeling bad or failure about yourself  0 0 0 0 0  Trouble concentrating 2 0 0 0 1  Moving slowly or fidgety/restless 0 0 0 0 0  Suicidal thoughts 0 0 0 0 0  PHQ-9 Score 8 6 6 8 8   Difficult doing work/chores Somewhat difficult Not difficult at all Somewhat difficult Not difficult at all Not difficult at all        11/27/2023    1:27 PM  Fall Risk   Falls in the past year? 1  Number falls in past yr: 0  Injury with Fall? 0  Risk for fall due to : History of fall(s)  Follow up Education provided    Patient Care Team: Sherre Clapper, MD as PCP - General (Family Medicine) Inocencio Soyla Lunger, MD as PCP - Electrophysiology (Cardiology) Bernie Lamar PARAS, MD as PCP - Cardiology (Cardiology) Cornelius Wanda DEL, MD as Consulting Physician (Oncology) Joesph Lynwood DEL, MD as Consulting Physician (General Surgery) Misenheimer, Evalene, MD as Consulting Physician (Unknown Physician Specialty) Micky Anes, DDS as Referring Physician (Orthodontics) Lininger, Ozell BIRCH., MD as Referring Physician (Surgery)   Review of Systems  Constitutional:  Negative for chills, fatigue and fever.  HENT:  Negative for congestion, ear pain, rhinorrhea and sore throat.   Respiratory:  Negative for cough and shortness of breath.  Cardiovascular:  Negative for chest pain.  Gastrointestinal:  Negative for abdominal pain, constipation, diarrhea, nausea and vomiting.  Genitourinary:  Negative for dysuria and urgency.  Musculoskeletal:  Negative for back pain and myalgias.  Neurological:  Negative for dizziness, weakness, light-headedness and headaches.  Psychiatric/Behavioral:  Negative for dysphoric mood. The patient is not nervous/anxious.     Current Outpatient Medications on File Prior to Visit  Medication Sig Dispense Refill   ELIQUIS  5 MG TABS tablet TAKE ONE TABLET BY MOUTH TWICE DAILY 60 tablet 2   Evolocumab  (REPATHA  SURECLICK) 140 MG/ML SOAJ Inject 140 mg into the skin every 14 (fourteen)  days. 2 mL 12   furosemide  (LASIX ) 40 MG tablet Take 1 tablet (40 mg total) by mouth daily. 90 tablet 2   gabapentin  (NEURONTIN ) 600 MG tablet Take 1 tablet (600 mg total) by mouth 4 (four) times daily. 360 tablet 1   letrozole (FEMARA) 2.5 MG tablet TAKE ONE TABLET BY MOUTH EVERY DAY 90 tablet 3   levothyroxine  (SYNTHROID ) 75 MCG tablet TAKE ONE TABLET BY MOUTH DAILY 90 tablet 1   omeprazole  (PRILOSEC) 40 MG capsule Take 1 capsule (40 mg total) by mouth in the morning and at bedtime. 180 capsule 3   potassium chloride  SA (KLOR-CON  M20) 20 MEQ tablet Take 1 tablet (20 mEq total) by mouth daily. 90 tablet 2   Zoledronic  Acid (RECLAST  IV) Inject into the vein.     No current facility-administered medications on file prior to visit.   Past Medical History:  Diagnosis Date   A-fib (HCC) 09/08/2015   Atrial fibrillation (HCC) 2016   Sees Dr. Lynwood Rakers. Cardiac ablation, loop recorder (removed in 2022)   Atrial fibrillation with rapid ventricular response (HCC) 04/2015   Breast cancer (HCC)    right lumpectomy   BRONCHITIS 03/10/2009   Qualifier: Diagnosis of  By: Perley MD, Ronal Dines    Chest pain with moderate risk for cardiac etiology 04/28/2015   Dehydration 06/18/2009   Qualifier: Diagnosis of  By: Nonnie ROSALEA Iha     Depression    Fall 07/20/2021   Family history of breast cancer    Family history of colon cancer    Fibromyalgia 2010   Hyperlipidemia    Hypothyroidism (acquired) 2006   s/p thyroidectomy for nodules   Morbid obesity (HCC) 08/10/2015   Obesity    gastric bpg 2011, weight then 400 lbs, lost 100 lbs in 60 days.   Obstructive sleep apnea    does not use cpap, does not have a following physician   Paroxysmal atrial fibrillation (HCC) 08/10/2015   Past Surgical History:  Procedure Laterality Date   ABDOMINAL HYSTERECTOMY  1992   total.    APPENDECTOMY     BREAST BIOPSY Left 08/04/2022   MM LT BREAST BX W LOC DEV 1ST LESION IMAGE BX SPEC STEREO GUIDE  08/04/2022 [facility]   BREAST BIOPSY Left 08/12/2022   MM LT BREAST BX W LOC DEV 1ST LESION IMAGE BX SPEC STEREO GUIDE 08/12/2022 [facility]   BREAST SURGERY  2019   right lumpectomy    CHOLECYSTECTOMY     COLONOSCOPY     ELECTROPHYSIOLOGIC STUDY N/A 09/08/2015   Procedure: Atrial Fibrillation Ablation;  Surgeon: Lynwood Rakers, MD;  Location: [facility] INVASIVE CV LAB;  Service: Cardiovascular;  Laterality: N/A;   EP IMPLANTABLE DEVICE N/A 12/17/2015   Procedure: Loop Recorder Insertion;  Surgeon: Lynwood Rakers, MD;  Location: [facility] INVASIVE CV LAB;  Service: Cardiovascular;  Laterality: N/A;  ESOPHAGOGASTRODUODENOSCOPY     EXPLORATORY LAPAROTOMY WITH ABDOMINAL MASS EXCISION  2018   GASTRIC BYPASS  2011   lost 100 lbs, but gained some back.   implantable loop recorder removal  03/25/2020   removed 2022   NECK SURGERY  2005   laminectomy?   THYROIDECTOMY  2006   for nodules ( hot and cold nodules) Goiter compressing trachea.    Family History  Problem Relation Age of Onset   Cancer Mother        breast   Heart failure Father    Cancer Father        colon   Coronary artery disease Brother 93   Heart failure Maternal Grandmother    Heart failure Maternal Grandfather    Social History   Socioeconomic History   Marital status: Single    Spouse name: Not on file   Number of children: Not on file   Years of education: Not on file   Highest education level: Bachelor's degree (e.g., BA, AB, BS)  Occupational History   Occupation: Economist  Tobacco Use   Smoking status: Never   Smokeless tobacco: Never  Vaping Use   Vaping status: Never Used  Substance and Sexual Activity   Alcohol use: Yes    Alcohol/week: 1.0 standard drink of alcohol    Types: 1 Standard drinks or equivalent per week    Comment: rare glass of wine   Drug use: No   Sexual activity: Not Currently  Other Topics Concern   Not on file  Social History Narrative   Pt lives in [place]  alone. 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   Social Drivers of Health   Financial Resource Strain: Medium Risk (11/20/2023)   Overall Financial Resource Strain (CARDIA)    Difficulty of Paying Living Expenses: Somewhat hard  Food Insecurity: Food Insecurity Present (11/20/2023)   Hunger Vital Sign    Worried About Running Out of Food in the Last Year: Sometimes true    Ran Out of Food in the Last Year: Sometimes true  Transportation Needs: No Transportation Needs (11/20/2023)   PRAPARE - Administrator, Civil Service (Medical): No    Lack of Transportation (Non-Medical): No  Physical Activity: Inactive (11/20/2023)   Exercise Vital Sign    Days of Exercise per Week: 0 days    Minutes of Exercise per Session: Not on file  Stress: Stress Concern Present (11/20/2023)   [employer] of Occupational Health - Occupational Stress Questionnaire    Feeling of Stress: To some extent  Social Connections: Socially Isolated (11/20/2023)   Social Connection and Isolation Panel    Frequency of Communication with Friends and Family: More than three times a week    Frequency of Social Gatherings with Friends and Family: Not on file    Attends Religious Services: Never    Database administrator or Organizations: No    Attends Engineer, structural: Not on file    Marital Status: Never married    Objective:  BP 112/70   Pulse (!) 55   Temp 98.1 F (36.7 C)   Ht 5' 7 (1.702 m)   Wt 237 lb (107.5 kg)   SpO2 99%   BMI 37.12 kg/m      01/01/2024    2:44 PM 11/27/2023    1:29 PM 11/27/2023    1:18 PM  BP/Weight  Systolic BP 112 90 80  Diastolic BP 70 60  40  Wt. (Lbs) 237  224  BMI 37.12 kg/m2  35.08 kg/m2    Physical Exam Vitals reviewed.  Constitutional:      Appearance: Normal appearance. She is obese.  Cardiovascular:     Rate and Rhythm: Normal rate and regular rhythm.     Heart sounds: Normal heart sounds.  Pulmonary:     Effort:  Pulmonary effort is normal. No respiratory distress.     Breath sounds: Normal breath sounds.  Musculoskeletal:        General: Tenderness (lumbar. BL knee tenderness. Discomfort with ROM of knees.) present.  Neurological:     Mental Status: She is alert and oriented to person, place, and time.  Psychiatric:        Mood and Affect: Mood normal.        Behavior: Behavior normal.         Lab Results  Component Value Date   WBC 7.2 10/25/2023   HGB 13.9 10/25/2023   HCT 42.6 10/25/2023   PLT 252 10/25/2023   GLUCOSE 96 10/25/2023   CHOL 126 06/29/2023   TRIG 97 06/29/2023   HDL 59 06/29/2023   LDLCALC 49 06/29/2023   ALT 9 10/25/2023   AST 14 10/25/2023   NA 141 10/25/2023   K 3.6 10/25/2023   CL 100 10/25/2023   CREATININE 1.03 (H) 10/25/2023   BUN 18 10/25/2023   CO2 27 10/25/2023   TSH 1.380 08/11/2022   HGBA1C 5.4 06/29/2023      Assessment & Plan:  Chronic pain syndrome Assessment & Plan: Increase butrans  patch 10 mg weekly.   Orders: -     Buprenorphine ; Place 1 patch onto the skin once a week.  Dispense: 4 patch; Refill: 2  Chronic pain of left knee Assessment & Plan: Order xray . Consider arthrocentesis  Orders: -     DG Knee Complete 4 Views Left; Future  Chronic pain of right knee Assessment & Plan: Order xray . Consider arthrocentesis  Orders: -     DG Knee Complete 4 Views Right; Future  Lumbar back pain Assessment & Plan: Increase butran's patches to 10 mg weekly.    Moderate recurrent major depression (HCC) Assessment & Plan: Start on wellbutrin  xl 150 mg once daily.   Orders: -     buPROPion  HCl ER (XL); Take 1 tablet (150 mg total) by mouth daily.  Dispense: 90 tablet; Refill: 0     Meds ordered this encounter  Medications   buPROPion  (WELLBUTRIN  XL) 150 MG 24 hr tablet    Sig: Take 1 tablet (150 mg total) by mouth daily.    Dispense:  90 tablet    Refill:  0   buprenorphine  (BUTRANS ) 10 MCG/HR PTWK    Sig: Place 1  patch onto the skin once a week.    Dispense:  4 patch    Refill:  2    Orders Placed This Encounter  Procedures   DG Knee Complete 4 Views Left   DG Knee Complete 4 Views Right     Follow-up: Return in about 3 months (around 04/02/2024) for chronic follow up.  n After Visit Summary was printed and given to the patient.  Abigail Free, MD [facility] [phone]

## 2024-01-02 ENCOUNTER — Ambulatory Visit (INDEPENDENT_AMBULATORY_CARE_PROVIDER_SITE_OTHER)
Admission: RE | Admit: 2024-01-02 | Discharge: 2024-01-02 | Disposition: A | Discharge status: Home / Self Care | Source: Ambulatory Visit | Attending: Family Medicine | Admitting: Family Medicine

## 2024-01-02 ENCOUNTER — Encounter: Payer: Self-pay | Admitting: Family Medicine

## 2024-01-02 DIAGNOSIS — M1712 Unilateral primary osteoarthritis, left knee: Secondary | ICD-10-CM | POA: Diagnosis not present

## 2024-01-02 DIAGNOSIS — G8929 Other chronic pain: Secondary | ICD-10-CM

## 2024-01-02 DIAGNOSIS — M25562 Pain in left knee: Secondary | ICD-10-CM | POA: Diagnosis not present

## 2024-01-02 DIAGNOSIS — M25561 Pain in right knee: Secondary | ICD-10-CM

## 2024-01-02 DIAGNOSIS — M545 Low back pain, unspecified: Secondary | ICD-10-CM | POA: Insufficient documentation

## 2024-01-02 DIAGNOSIS — M1711 Unilateral primary osteoarthritis, right knee: Secondary | ICD-10-CM | POA: Diagnosis not present

## 2024-01-02 NOTE — Assessment & Plan Note (Signed)
 Increase butran's patches to 10 mg weekly.

## 2024-01-02 NOTE — Assessment & Plan Note (Signed)
 Increase butrans  patch 10 mg weekly.

## 2024-01-02 NOTE — Assessment & Plan Note (Addendum)
 Order xray . Consider arthrocentesis

## 2024-01-02 NOTE — Assessment & Plan Note (Signed)
 Order xray . Consider arthrocentesis

## 2024-01-02 NOTE — Assessment & Plan Note (Signed)
 Start on wellbutrin  xl 150 mg once daily.

## 2024-01-03 ENCOUNTER — Other Ambulatory Visit: Payer: Self-pay | Admitting: Family Medicine

## 2024-01-03 DIAGNOSIS — F331 Major depressive disorder, recurrent, moderate: Secondary | ICD-10-CM

## 2024-01-03 MED ORDER — BUPROPION HCL ER (XL) 150 MG PO TB24: 150.0000 mg | ORAL_TABLET | Freq: Every day | ORAL | 0 refills | Status: DC

## 2024-01-06 ENCOUNTER — Encounter: Payer: Self-pay | Admitting: Cardiology

## 2024-01-08 ENCOUNTER — Other Ambulatory Visit: Payer: Self-pay | Admitting: Family Medicine

## 2024-01-08 DIAGNOSIS — R600 Localized edema: Secondary | ICD-10-CM

## 2024-01-15 ENCOUNTER — Ambulatory Visit: Payer: Self-pay | Admitting: Family Medicine

## 2024-01-25 ENCOUNTER — Encounter: Payer: Self-pay | Admitting: Cardiology

## 2024-01-25 ENCOUNTER — Ambulatory Visit: Attending: Cardiology | Admitting: Cardiology

## 2024-01-25 VITALS — BP 100/64 | HR 81 | Ht 67.0 in | Wt 227.0 lb

## 2024-01-25 DIAGNOSIS — Z9884 Bariatric surgery status: Secondary | ICD-10-CM | POA: Diagnosis not present

## 2024-01-25 DIAGNOSIS — G4733 Obstructive sleep apnea (adult) (pediatric): Secondary | ICD-10-CM | POA: Diagnosis not present

## 2024-01-25 DIAGNOSIS — Z8679 Personal history of other diseases of the circulatory system: Secondary | ICD-10-CM

## 2024-01-25 DIAGNOSIS — I480 Paroxysmal atrial fibrillation: Secondary | ICD-10-CM

## 2024-01-25 DIAGNOSIS — Z9889 Other specified postprocedural states: Secondary | ICD-10-CM | POA: Diagnosis not present

## 2024-01-25 DIAGNOSIS — E782 Mixed hyperlipidemia: Secondary | ICD-10-CM | POA: Diagnosis not present

## 2024-01-25 NOTE — Progress Notes (Signed)
 Cardiology Office Note:    Date:  01/25/2024   ID:  Monique, Kelly 07/08/1955, MRN [id number]  PCP:  Monique Wanda DEL, MD  Cardiologist:  Monique Fitch, MD    Referring MD: Monique Clapper, MD   Chief Complaint  Patient presents with   Follow-up    History of Present Illness:    Monique Kelly is a 68 y.o. 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  She was referred to hospital for swelling of lower extremities, echocardiogram has been done which showed normal systolic normal diastolic function, left atrium was mildly enlarged there was some trivial to mild mitral regurgitation.  She was given some diuretic with relief and with some help.  She is very conscious about her health situation she is watching a lot of things on the Internet.  But overall she said water pill helps some but not completely complaining of having some fatigue tiredness but otherwise seems to be doing fine no episodes of paroxysmal atrial fibrillation  Past Medical History:  Diagnosis Date   A-fib (HCC) 09/08/2015   Atrial fibrillation (HCC) 2016   Sees Dr. Lynwood Kelly. Cardiac ablation, loop recorder (removed in 2022)   Atrial fibrillation with rapid ventricular response (HCC) 04/2015   Breast cancer (HCC)    right lumpectomy   BRONCHITIS 03/10/2009   Qualifier: Diagnosis of  By: Perley MD, Monique Dines    Chest pain with moderate risk for cardiac etiology 04/28/2015   Dehydration 06/18/2009   Qualifier: Diagnosis of  By: Monique Kelly     Depression    Fall 07/20/2021   Family history of breast cancer    Family history of colon cancer    Fibromyalgia 2010   Hyperlipidemia    Hypothyroidism (acquired) 2006   s/p thyroidectomy for nodules   Morbid obesity (HCC) 08/10/2015   Obesity    gastric bpg 2011, weight  then 400 lbs, lost 100 lbs in 60 days.   Obstructive sleep apnea    does not use cpap, does not have a following physician   Paroxysmal atrial fibrillation (HCC) 08/10/2015    Past Surgical History:  Procedure Laterality Date   ABDOMINAL HYSTERECTOMY  1992   total.    APPENDECTOMY     BREAST BIOPSY Left 08/04/2022   MM LT BREAST BX W LOC DEV 1ST LESION IMAGE BX SPEC STEREO GUIDE 08/04/2022 [facility]   BREAST BIOPSY Left 08/12/2022   MM LT BREAST BX W LOC DEV 1ST LESION IMAGE BX SPEC STEREO GUIDE 08/12/2022 [facility]   BREAST SURGERY  2019   right lumpectomy    CHOLECYSTECTOMY     COLONOSCOPY     ELECTROPHYSIOLOGIC STUDY N/A 09/08/2015   Procedure: Atrial Fibrillation Ablation;  Surgeon: Monique Rakers, MD;  Location: [facility] INVASIVE CV LAB;  Service: Cardiovascular;  Laterality: N/A;   EP IMPLANTABLE DEVICE N/A 12/17/2015   Procedure: Loop Recorder Insertion;  Surgeon: Monique Rakers, MD;  Location: [facility] INVASIVE CV LAB;  Service: Cardiovascular;  Laterality: N/A;   ESOPHAGOGASTRODUODENOSCOPY     EXPLORATORY LAPAROTOMY WITH ABDOMINAL MASS EXCISION  2018   GASTRIC BYPASS  2011   lost 100 lbs, but gained some back.   implantable loop recorder removal  03/25/2020   removed 2022   NECK SURGERY  2005   laminectomy?   THYROIDECTOMY  2006   for nodules ( hot  and cold nodules) Goiter compressing trachea.    Current Medications: Current Meds  Medication Sig   buprenorphine  (BUTRANS ) 10 MCG/HR PTWK Place 1 patch onto the skin once a week.   buPROPion  (WELLBUTRIN  XL) 150 MG 24 hr tablet Take 1 tablet (150 mg total) by mouth daily.   diltiazem  (CARDIZEM  CD) 120 MG 24 hr capsule TAKE ONE CAPSULE BY MOUTH DAILY   ELIQUIS  5 MG TABS tablet TAKE ONE TABLET BY MOUTH TWICE DAILY   Evolocumab  (REPATHA  SURECLICK) 140 MG/ML SOAJ Inject 140 mg into the skin every 14 (fourteen) days.   furosemide  (LASIX ) 40 MG tablet Take 1 tablet (40 mg total) by mouth daily.   gabapentin  (NEURONTIN ) 600 MG  tablet Take 1 tablet (600 mg total) by mouth 4 (four) times daily.   letrozole (FEMARA) 2.5 MG tablet TAKE ONE TABLET BY MOUTH EVERY DAY   levothyroxine  (SYNTHROID ) 75 MCG tablet TAKE ONE TABLET BY MOUTH DAILY   omeprazole  (PRILOSEC) 40 MG capsule Take 1 capsule (40 mg total) by mouth in the morning and at bedtime.   potassium chloride  SA (KLOR-CON  M20) 20 MEQ tablet Take 1 tablet (20 mEq total) by mouth daily.   Zoledronic  Acid (RECLAST  IV) Inject into the vein.     Allergies:   Latex, Nitroglycerin , and Wound dressings   Social History   Socioeconomic History   Marital status: Single    Spouse name: Not on file   Number of children: Not on file   Years of education: Not on file   Highest education level: Bachelor's degree (e.g., BA, AB, BS)  Occupational History   Occupation: Economist  Tobacco Use   Smoking status: Never   Smokeless tobacco: Never  Vaping Use   Vaping status: Never Used  Substance and Sexual Activity   Alcohol use: Yes    Alcohol/week: 1.0 standard drink of alcohol    Types: 1 Standard drinks or equivalent per week    Comment: rare glass of wine   Drug use: No   Sexual activity: Not Currently  Other Topics Concern   Not on file  Social History Narrative   Pt lives in [place] alone. 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   Social Drivers of Health   Financial Resource Strain: Medium Risk (11/20/2023)   Overall Financial Resource Strain (CARDIA)    Difficulty of Paying Living Expenses: Somewhat hard  Food Insecurity: Food Insecurity Present (11/20/2023)   Hunger Vital Sign    Worried About Running Out of Food in the Last Year: Sometimes true    Ran Out of Food in the Last Year: Sometimes true  Transportation Needs: No Transportation Needs (11/20/2023)   PRAPARE - Administrator, Civil Service (Medical): No    Lack of Transportation (Non-Medical): No  Physical Activity: Inactive (11/20/2023)   Exercise  Vital Sign    Days of Exercise per Week: 0 days    Minutes of Exercise per Session: Not on file  Stress: Stress Concern Present (11/20/2023)   [employer] of Occupational Health - Occupational Stress Questionnaire    Feeling of Stress: To some extent  Social Connections: Socially Isolated (11/20/2023)   Social Connection and Isolation Panel    Frequency of Communication with Friends and Family: More than three times a week    Frequency of Social Gatherings with Friends and Family: Not on file    Attends Religious Services: Never    Active Member of Clubs or Organizations: No  Attends Banker Meetings: Not on file    Marital Status: Never married     Family History: The patient's family history includes Cancer in her father and mother; Coronary artery disease (age of onset: 35) in her brother; Heart failure in her father, maternal grandfather, and maternal grandmother. ROS:   Please see the history of present illness.    All 14 point review of systems negative except as described per history of present illness  EKGs/Labs/Other Studies Reviewed:         Recent Labs: 10/25/2023: ALT 9; BUN 18; Creatinine, Ser 1.03; Hemoglobin 13.9; NT-Pro BNP 409; Platelets 252; Potassium 3.6; Sodium 141  Recent Lipid Panel    Component Value Date/Time   CHOL 126 06/29/2023 1539   TRIG 97 06/29/2023 1539   HDL 59 06/29/2023 1539   CHOLHDL 2.1 06/29/2023 1539   LDLCALC 49 06/29/2023 1539    Physical Exam:    VS:  BP 100/64   Pulse 81   Ht 5' 7 (1.702 m)   Wt 227 lb (103 kg)   SpO2 93%   BMI 35.55 kg/m     Wt Readings from Last 3 Encounters:  01/25/24 227 lb (103 kg)  01/01/24 237 lb (107.5 kg)  11/27/23 224 lb (101.6 kg)     GEN:  Well nourished, well developed in no acute distress HEENT: Normal NECK: No JVD; No carotid bruits LYMPHATICS: No lymphadenopathy CARDIAC: RRR, no murmurs, no rubs, no gallops RESPIRATORY:  Clear to auscultation without rales,  wheezing or rhonchi  ABDOMEN: Soft, non-tender, non-distended MUSCULOSKELETAL:  No edema; No deformity  SKIN: Warm and dry LOWER EXTREMITIES: no swelling NEUROLOGIC:  Alert and oriented x 3 PSYCHIATRIC:  Normal affect   ASSESSMENT:    1. Paroxysmal atrial fibrillation (HCC)   2. Obstructive sleep apnea syndrome   3. Mixed hyperlipidemia   4. Gastric bypass status for obesity   5. S/P ablation of atrial fibrillation    PLAN:    In order of problems listed above:  Paroxysmal atrial fibrillation.  Seems to maintain sinus rhythm, anticoagulation, she asked me about potentially stopping Eliquis  I told her absolutely not and I told her what the rationale for that is.  I will continue monitoring. Swelling of lower extremities.  There is not much pitting.  I think continuation of diuretic at present dose should be appropriate I told her if swelling increase she may increase elevated dose of diuretic but I also warned her and caution her about potential hypotension.  She does have history of hypotension before.  And she does have soft blood pressure today.  I told her to be very careful with that. Mild dyslipidemia, she is taking Repatha  and her cholesterol looks great LDL 49 HDL 59 that is good cholesterol will continue present management. Status post gastric bypass surgery.  Noted   Medication Adjustments/Labs and Tests Ordered: Current medicines are reviewed at length with the patient today.  Concerns regarding medicines are outlined above.  No orders of the defined types were placed in this encounter.  Medication changes: No orders of the defined types were placed in this encounter.   Signed, Monique DOROTHA Fitch, MD, [facility] 01/25/2024 4:03 PM    [facility]

## 2024-01-25 NOTE — Patient Instructions (Signed)
 Medication Instructions:  Your physician recommends that you continue on your current medications as directed. Please refer to the Current Medication list given to you today.  *If you need a refill on your cardiac medications before your next appointment, please call your pharmacy*   Lab Work: None Ordered If you have labs (blood work) drawn today and your tests are completely normal, you will receive your results only by: MyChart Message (if you have MyChart) OR A paper copy in the mail If you have any lab test that is abnormal or we need to change your treatment, we will call you to review the results.   Testing/Procedures: None Ordered   Follow-Up: At [facility], you and your health needs are our priority.  As part of our continuing mission to provide you with exceptional heart care, we have created designated Provider Care Teams.  These Care Teams include your primary Cardiologist (physician) and Advanced Practice Providers (APPs -  Physician Assistants and Nurse Practitioners) who all work together to provide you with the care you need, when you need it.  We recommend signing up for the patient portal called MyChart.  Sign up information is provided on this After Visit Summary.  MyChart is used to connect with patients for Virtual Visits (Telemedicine).  Patients are able to view lab/test results, encounter notes, upcoming appointments, etc.  Non-urgent messages can be sent to your provider as well.   To learn more about what you can do with MyChart, go to [email or web address].    Your next appointment:   5 month(s)  The format for your next appointment:   In Person  Provider:   Lamar Fitch, MD    Other Instructions NA

## 2024-01-26 ENCOUNTER — Encounter: Payer: Self-pay | Admitting: Family Medicine

## 2024-01-28 NOTE — Progress Notes (Signed)
 "  Subjective:  Patient ID: Monique Kelly, female    DOB: 1956-05-10  Age: 68 y.o. MRN: [id number]  Chief Complaint  Patient presents with   Medical Management of Chronic Issues    HPI: Discussed the use of AI scribe software for clinical note transcription with the patient, who gave verbal consent to proceed.  History of Present Illness Monique Kelly is a 68 year old female who presents for follow-up on her pain management and mental health.  Chronic pain management - Currently using Butrans  10 mcg/hour patches once weekly for pain control - Pain level is approximately 2/10 with the patch - Recent issue with patch detachment and difficulty obtaining a prompt refill - Applies patch around the base of her back - Allergic to certain types of bandaging, including latex - On gabapentin  for 20 years for pain management  Depressive and neuropsychiatric symptoms - Experiencing increased anger and depression - Restarted Wellbutrin  after a month off; currently on 150 mg with plan to increase to 300 mg daily - Feels down and depressed daily - Lack of appetite and difficulty concentrating - No suicidal ideation - Persistent fatigue and anhedonia  Peripheral edema and venous insufficiency - Swelling in feet - Discomfort over veins - History of varicose veins, previously treated with laser therapy - Recurrence of varicose veins on left leg - On lasix  40 mg daily.   Cardiopulmonary symptoms and sleep disturbance - Occasional chest pain and breathing problems - History of sleep apnea, unable to tolerate CPAP therapy - Poor sleep quality  Gastrointestinal symptoms - History of hiatal hernia - Poor appetite, often only eating when necessary - Frequent weakness - History of ulcer  Impaired mobility and fall risk - Difficulty rising from the floor without assistance - Recent fall - Interest in exercises to improve strength and prevent future falls  GERD: Taking  omeprazole  40 mg daily   A. FIB: Taking Diltiazem  CD 120 mg daily, Eliquis  5 mg twice daily.  Hypothyroidism: Takes synthroid  75 mcg daily.   Hyperlipidemia: on repatha  140 mg IM every 2 weeks.      01/29/2024    3:27 PM 11/27/2023    1:28 PM 10/30/2023    1:32 PM 06/29/2023    2:05 PM 04/04/2023    2:19 PM  Depression screen PHQ 2/9  Decreased Interest 3 1 1 1 2   Down, Depressed, Hopeless 3 1 1 1 2   PHQ - 2 Score 6 2 2 2 4   Altered sleeping 2 1 2 2 2   Tired, decreased energy 3 3 2 2 2   Change in appetite 3 0  0 0  Feeling bad or failure about yourself  0 0 0 0 0  Trouble concentrating 3 2 0 0 0  Moving slowly or fidgety/restless 0 0 0 0 0  Suicidal thoughts 0 0 0 0 0  PHQ-9 Score 17 8 6 6 8   Difficult doing work/chores Somewhat difficult Somewhat difficult Not difficult at all Somewhat difficult Not difficult at all        11/27/2023    1:27 PM  Fall Risk   Falls in the past year? 1  Number falls in past yr: 0  Injury with Fall? 0  Risk for fall due to : History of fall(s)  Follow up Education provided    Patient Care Team: Sherre Clapper, MD as PCP - General (Family Medicine) Inocencio Soyla Lunger, MD as PCP - Electrophysiology (Cardiology) Bernie Lamar PARAS, MD as PCP - Cardiology (  Cardiology) Cornelius Wanda DEL, MD as Consulting Physician (Oncology) Joesph Lynwood DEL, MD as Consulting Physician (General Surgery) Misenheimer, Evalene, MD as Consulting Physician (Unknown Physician Specialty) Micky Anes, DDS as Referring Physician (Orthodontics) Lininger, Ozell BIRCH., MD as Referring Physician (Surgery)   Review of Systems  Constitutional:  Negative for chills, fatigue and fever.  HENT:  Negative for congestion, ear pain, rhinorrhea and sore throat.   Respiratory:  Negative for cough and shortness of breath.   Cardiovascular:  Negative for chest pain.  Gastrointestinal:  Negative for abdominal pain, constipation, diarrhea, nausea and vomiting.  Genitourinary:  Negative  for dysuria and urgency.  Musculoskeletal:  Positive for arthralgias and back pain. Negative for myalgias.  Skin:  Negative for rash.  Neurological:  Negative for dizziness, weakness, light-headedness and headaches.  Psychiatric/Behavioral:  Negative for dysphoric mood. The patient is not nervous/anxious.     Current Outpatient Medications on File Prior to Visit  Medication Sig Dispense Refill   buprenorphine  (BUTRANS ) 10 MCG/HR PTWK Place 1 patch onto the skin once a week. 4 patch 2   diltiazem  (CARDIZEM  CD) 120 MG 24 hr capsule TAKE ONE CAPSULE BY MOUTH DAILY 90 capsule 0   ELIQUIS  5 MG TABS tablet TAKE ONE TABLET BY MOUTH TWICE DAILY 60 tablet 2   Evolocumab  (REPATHA  SURECLICK) 140 MG/ML SOAJ Inject 140 mg into the skin every 14 (fourteen) days. 2 mL 12   furosemide  (LASIX ) 40 MG tablet Take 1 tablet (40 mg total) by mouth daily. 90 tablet 2   gabapentin  (NEURONTIN ) 600 MG tablet Take 1 tablet (600 mg total) by mouth 4 (four) times daily. 360 tablet 1   letrozole (FEMARA) 2.5 MG tablet TAKE ONE TABLET BY MOUTH EVERY DAY 90 tablet 3   levothyroxine  (SYNTHROID ) 75 MCG tablet TAKE ONE TABLET BY MOUTH DAILY 90 tablet 1   omeprazole  (PRILOSEC) 40 MG capsule Take 1 capsule (40 mg total) by mouth in the morning and at bedtime. 180 capsule 3   potassium chloride  SA (KLOR-CON  M20) 20 MEQ tablet Take 1 tablet (20 mEq total) by mouth daily. 90 tablet 2   Zoledronic  Acid (RECLAST  IV) Inject into the vein.     No current facility-administered medications on file prior to visit.   Past Medical History:  Diagnosis Date   A-fib (HCC) 09/08/2015   Atrial fibrillation (HCC) 2016   Sees Dr. Lynwood Rakers. Cardiac ablation, loop recorder (removed in 2022)   Atrial fibrillation with rapid ventricular response (HCC) 04/2015   Breast cancer (HCC)    right lumpectomy   BRONCHITIS 03/10/2009   Qualifier: Diagnosis of  By: Perley MD, Ronal Dines    Chest pain with moderate risk for cardiac etiology 04/28/2015    Dehydration 06/18/2009   Qualifier: Diagnosis of  By: Nonnie ROSALEA Iha     Depression    Fall 07/20/2021   Family history of breast cancer    Family history of colon cancer    Fibromyalgia 2010   Hyperlipidemia    Hypothyroidism (acquired) 2006   s/p thyroidectomy for nodules   Morbid obesity (HCC) 08/10/2015   Obesity    gastric bpg 2011, weight then 400 lbs, lost 100 lbs in 60 days.   Obstructive sleep apnea    does not use cpap, does not have a following physician   Paroxysmal atrial fibrillation (HCC) 08/10/2015   Past Surgical History:  Procedure Laterality Date   ABDOMINAL HYSTERECTOMY  1992   total.    APPENDECTOMY     BREAST  BIOPSY Left 08/04/2022   MM LT BREAST BX W LOC DEV 1ST LESION IMAGE BX SPEC STEREO GUIDE 08/04/2022 [facility]   BREAST BIOPSY Left 08/12/2022   MM LT BREAST BX W LOC DEV 1ST LESION IMAGE BX SPEC STEREO GUIDE 08/12/2022 [facility]   BREAST SURGERY  2019   right lumpectomy    CHOLECYSTECTOMY     COLONOSCOPY     ELECTROPHYSIOLOGIC STUDY N/A 09/08/2015   Procedure: Atrial Fibrillation Ablation;  Surgeon: Lynwood Rakers, MD;  Location: [facility] INVAS[facility] CV LAB;  Service: Cardiovascular;  Laterality: N/A;   EP IMPLANTABLE DEVICE N/A 12/17/2015   Procedure: Loop Recorder Insertion;  Surgeon: Lynwood Rakers, MD;  Location: [facility] INVASIVE CV LAB;  Service: Cardiovascular;  Laterality: N/A;   ESOPHAGOGASTRODUODENOSCOPY     EXPLORATORY LAPAROTOMY WITH ABDOMINAL MASS EXCISION  2018   GASTRIC BYPASS  2011   lost 100 lbs, but gained some back.   implantable loop recorder removal  03/25/2020   removed 2022   NECK SURGERY  2005   laminectomy?   THYROIDECTOMY  2006   for nodules ( hot and cold nodules) Goiter compressing trachea.    Family History  Problem Relation Age of Onset   Cancer Mother        breast   Heart failure Father    Cancer Father        colon   Coronary artery disease Brother 80   Heart failure Maternal Grandmother    Heart  failure Maternal Grandfather    Social History   Socioeconomic History   Marital status: Single    Spouse name: Not on file   Number of children: Not on file   Years of education: Not on file   Highest education level: Bachelor's degree (e.g., BA, AB, BS)  Occupational History   Occupation: Economist  Tobacco Use   Smoking status: Never   Smokeless tobacco: Never  Vaping Use   Vaping status: Never Used  Substance and Sexual Activity   Alcohol use: Yes    Alcohol/week: 1.0 standard drink of alcohol    Types: 1 Standard drinks or equivalent per week    Comment: rare glass of wine   Drug use: No   Sexual activity: Not Currently  Other Topics Concern   Not on file  Social History Narrative   Pt lives in [place] alone. Works as counsellor for [employer]   Right handed   Social Drivers of Health   Financial Resource Strain: Medium Risk (11/20/2023)   Overall Financial Resource Strain (CARDIA)    Difficulty of Paying Living Expenses: Somewhat hard  Food Insecurity: Food Insecurity Present (11/20/2023)   Hunger Vital Sign    Worried About Running Out of Food in the Last Year: Sometimes true    Ran Out of Food in the Last Year: Sometimes true  Transportation Needs: No Transportation Needs (11/20/2023)   PRAPARE - Administrator, Civil Service (Medical): No    Lack of Transportation (Non-Medical): No  Physical Activity: Inactive (11/20/2023)   Exercise Vital Sign    Days of Exercise per Week: 0 days    Minutes of Exercise per Session: Not on file  Stress: Stress Concern Present (11/20/2023)   [employer] of Occupational Health - Occupational Stress Questionnaire    Feeling of Stress: To some extent  Social Connections: Socially Isolated (11/20/2023)   Social Connection and Isolation Panel    Frequency of Communication with Friends and Family: More than three times  a week    Frequency of Social Gatherings with Friends and Family: Not on  file    Attends Religious Services: Never    Database Administrator or Organizations: No    Attends Engineer, Structural: Not on file    Marital Status: Never married    Objective:  BP 104/68   Pulse 83   Temp 97.9 F (36.6 C)   Ht 5' 7 (1.702 m)   Wt 229 lb (103.9 kg)   SpO2 95%   BMI 35.87 kg/m      01/29/2024    2:39 PM 01/25/2024    3:16 PM 01/01/2024    2:44 PM  BP/Weight  Systolic BP 104 100 112  Diastolic BP 68 64 70  Wt. (Lbs) 229 227 237  BMI 35.87 kg/m2 35.55 kg/m2 37.12 kg/m2    Physical Exam Vitals reviewed.  Constitutional:      Appearance: Normal appearance. She is obese.  Cardiovascular:     Rate and Rhythm: Normal rate. Rhythm irregular.     Heart sounds: Normal heart sounds.     Comments: Left leg varicose vein.  Pulmonary:     Effort: Pulmonary effort is normal. No respiratory distress.     Breath sounds: Normal breath sounds.  Abdominal:     General: Abdomen is flat. Bowel sounds are normal.     Palpations: Abdomen is soft.     Tenderness: There is no abdominal tenderness.  Musculoskeletal:     Right lower leg: 2+ Edema present.     Left lower leg: 2+ Edema present.     Comments: Able to stand without use of hands.   Neurological:     Mental Status: She is alert and oriented to person, place, and time.  Psychiatric:        Mood and Affect: Mood normal.        Behavior: Behavior normal.         Lab Results  Component Value Date   WBC 7.2 10/25/2023   HGB 13.9 10/25/2023   HCT 42.6 10/25/2023   PLT 252 10/25/2023   GLUCOSE 96 10/25/2023   CHOL 126 06/29/2023   TRIG 97 06/29/2023   HDL 59 06/29/2023   LDLCALC 49 06/29/2023   ALT 9 10/25/2023   AST 14 10/25/2023   NA 141 10/25/2023   K 3.6 10/25/2023   CL 100 10/25/2023   CREATININE 1.03 (H) 10/25/2023   BUN 18 10/25/2023   CO2 27 10/25/2023   TSH 1.380 08/11/2022   HGBA1C 5.4 06/29/2023      Assessment & Plan:  Chronic pain syndrome  Moderate recurrent major  depression (HCC) Assessment & Plan: - Increase Wellbutrin  XL to 300 mg daily.   Pedal edema Assessment & Plan: Refer to vascular and vein specialist for evaluation and potential treatment.  Orders: -     Microalbumin / creatinine urine ratio -     Comprehensive metabolic panel with GFR  Varicose veins of left lower extremity with pain Assessment & Plan: Refer to vascular and vein specialist for evaluation and potential treatment.  Orders: -     Ambulatory referral to Vascular Surgery  Hypothyroidism (acquired) -     TSH  Mixed hyperlipidemia -     CBC with Differential/Platelet -     Lipid panel  Encounter for immunization -     Flu vaccine HIGH DOSE PF(Fluzone Trivalent)    Assessment & Plan Chronic pain syndrome Chronic pain managed with Butrans  10 mcg/hour  patch, reducing pain to 2/10. Patch occasionally detaches, possibly due to adhesive issues. Allergic to latex, complicating adhesive options. - Continue Butrans  10 mcg/hour patch weekly. - Explore alternative latex-free adhesives at the pharmacy to secure the patch.  Major depressive disorder, single episode Increased anger and depression despite improved pain management. Restarted Wellbutrin  150 mg after a month off. Experiences irritability, lack of pleasure, poor sleep, and low appetite. No suicidal thoughts reported. - Increase Wellbutrin  XL to 300 mg daily.  Localized edema - likely due to hypoalbuminemia. Persistent swelling in lower extremities not attributed to cardiac issues. Gabapentin  and diltiazem  could contribute. Low protein intake may also play a role. - Order urine test to check for proteinuria. - Order blood work to assess kidney function and protein levels. - Increase dietary protein intake. - Use compression stockings at home. - Refer to vascular and vein specialist for further evaluation.  Varicose veins of left lower extremity Significant varicose veins previously treated with laser therapy.  Currently experiences mild varicose veins on left lower extremity, which are painful. Insurance may cover treatment if compression stockings are used and anti-inflammatory medications are contraindicated due to Eliquis  use. - Use compression stockings regularly. - Refer to vascular and vein specialist for evaluation and potential treatment.  Hypothyroidism -Previously well controlled Continue Synthroid  at current dose  Recheck TSH and adjust Synthroid  as indicated   Mixed hyperlipidemia -continue repatha .  -check lipids -Recommend continue to work on eating healthy diet and exercise.        No orders of the defined types were placed in this encounter.   Orders Placed This Encounter  Procedures   Flu vaccine HIGH DOSE PF(Fluzone Trivalent)   Microalbumin / creatinine urine ratio   CBC with Differential/Platelet   Comprehensive metabolic panel with GFR   Lipid panel   TSH   Ambulatory referral to Vascular Surgery     Follow-up: Return in about 3 months (around 04/29/2024) for chronic follow up.  An After Visit Summary was printed and given to the patient.  Abigail Free, MD [facility] [phone] "

## 2024-01-29 ENCOUNTER — Ambulatory Visit: Admitting: Family Medicine

## 2024-01-29 ENCOUNTER — Ambulatory Visit (INDEPENDENT_AMBULATORY_CARE_PROVIDER_SITE_OTHER): Admitting: Family Medicine

## 2024-01-29 ENCOUNTER — Encounter: Payer: Self-pay | Admitting: Family Medicine

## 2024-01-29 VITALS — BP 104/68 | HR 83 | Temp 97.9°F | Ht 67.0 in | Wt 229.0 lb

## 2024-01-29 DIAGNOSIS — E782 Mixed hyperlipidemia: Secondary | ICD-10-CM

## 2024-01-29 DIAGNOSIS — F331 Major depressive disorder, recurrent, moderate: Secondary | ICD-10-CM

## 2024-01-29 DIAGNOSIS — E039 Hypothyroidism, unspecified: Secondary | ICD-10-CM

## 2024-01-29 DIAGNOSIS — Z23 Encounter for immunization: Secondary | ICD-10-CM | POA: Diagnosis not present

## 2024-01-29 DIAGNOSIS — F5101 Primary insomnia: Secondary | ICD-10-CM

## 2024-01-29 DIAGNOSIS — G8929 Other chronic pain: Secondary | ICD-10-CM

## 2024-01-29 DIAGNOSIS — R600 Localized edema: Secondary | ICD-10-CM | POA: Diagnosis not present

## 2024-01-29 DIAGNOSIS — I83812 Varicose veins of left lower extremities with pain: Secondary | ICD-10-CM

## 2024-01-29 DIAGNOSIS — G894 Chronic pain syndrome: Secondary | ICD-10-CM

## 2024-01-29 NOTE — Patient Instructions (Signed)
  VISIT SUMMARY: Today, we discussed your ongoing pain management, mental health, and other health concerns. We made adjustments to your medications and provided recommendations for managing your symptoms and improving your overall well-being.  YOUR PLAN: MAJOR DEPRESSIVE DISORDER: You are experiencing increased anger and depression despite improved pain management. You have restarted Wellbutrin  and are currently on 150 mg. -Increase Wellbutrin  XL to 300 mg daily.  CHRONIC PAIN SYNDROME: Your chronic pain is being managed with Butrans  patches, which reduce your pain to a 2/10. However, you have had issues with the patches detaching. -Continue using Butrans  10 mcg/hour patch weekly. -Explore alternative latex-free adhesives at the pharmacy to secure the patch.  LOCALIZED EDEMA: You have persistent swelling in your lower extremities, which may be related to your medications or low protein intake. -Order urine test to check for proteinuria. -Order blood work to assess kidney function and protein levels. -Increase dietary protein intake. -Use compression stockings at home. -Refer to a vascular and vein specialist for further evaluation.  VARICOSE VEINS OF LEFT LOWER EXTREMITY: You have significant varicose veins on your left leg, which are painful. You have a history of laser therapy treatment for varicose veins. -Use compression stockings regularly. -Refer to a vascular and vein specialist for evaluation and potential treatment.                      Contains text generated by Abridge.                                 Contains text generated by Abridge.

## 2024-01-30 ENCOUNTER — Inpatient Hospital Stay: Attending: Oncology

## 2024-01-30 ENCOUNTER — Ambulatory Visit

## 2024-02-04 ENCOUNTER — Other Ambulatory Visit: Payer: Self-pay | Admitting: Family Medicine

## 2024-02-04 DIAGNOSIS — I83812 Varicose veins of left lower extremities with pain: Secondary | ICD-10-CM | POA: Insufficient documentation

## 2024-02-04 MED ORDER — BUPROPION HCL ER (XL) 300 MG PO TB24: 300.0000 mg | ORAL_TABLET | Freq: Every day | ORAL | Status: DC

## 2024-02-04 NOTE — Assessment & Plan Note (Signed)
 Refer to vascular and vein specialist for evaluation and potential treatment.

## 2024-02-04 NOTE — Assessment & Plan Note (Signed)
Increase Wellbutrin XL to 300mg daily

## 2024-02-09 ENCOUNTER — Other Ambulatory Visit: Payer: Self-pay | Admitting: Cardiology

## 2024-02-14 ENCOUNTER — Ambulatory Visit: Admitting: Family Medicine

## 2024-02-26 ENCOUNTER — Encounter: Payer: Self-pay | Admitting: Family Medicine

## 2024-02-27 ENCOUNTER — Ambulatory Visit: Admitting: Family Medicine

## 2024-02-27 ENCOUNTER — Encounter: Payer: Self-pay | Admitting: Family Medicine

## 2024-02-27 ENCOUNTER — Telehealth: Admitting: Family Medicine

## 2024-02-27 DIAGNOSIS — F331 Major depressive disorder, recurrent, moderate: Secondary | ICD-10-CM | POA: Diagnosis not present

## 2024-02-27 DIAGNOSIS — F411 Generalized anxiety disorder: Secondary | ICD-10-CM | POA: Insufficient documentation

## 2024-02-27 DIAGNOSIS — R2689 Other abnormalities of gait and mobility: Secondary | ICD-10-CM | POA: Diagnosis not present

## 2024-02-27 MED ORDER — VORTIOXETINE HBR 5 MG PO TABS: 5.0000 mg | ORAL_TABLET | Freq: Every day | ORAL | 0 refills | Status: DC

## 2024-02-27 MED ORDER — BUPROPION HCL ER (XL) 150 MG PO TB24: 150.0000 mg | ORAL_TABLET | Freq: Every day | ORAL | 0 refills | Status: AC

## 2024-02-27 NOTE — Assessment & Plan Note (Addendum)
 Patient has several complaints that seem to be related to anxiety. - Wellbutrin  was recently increased to 300 mg once daily and she has noticed that she is much more angry than usual - Discussed decreasing the Wellbutrin  to 150 mg for 2 weeks starting a new medication, Trintellix 5 mg for 2 weeks and then taking the Wellbutrin  every other day for 2 weeks before discontinuing. Wellbutrin  has been known to increase anxiety in some patients.  FU in 3-4 weeks    02/27/2024    3:04 PM 02/27/2024    3:03 PM 02/27/2024    2:40 PM 11/27/2023    1:29 PM  GAD 7 : Generalized Anxiety Score  Nervous, Anxious, on Edge 2  2 1   Control/stop worrying 3  3 2   Worry too much - different things 2  2 2   Trouble relaxing 3  3 3   Restless 0  0 0  Easily annoyed or irritable 3  3 2   Afraid - awful might happen 0   0  Total GAD 7 Score 13   10  Anxiety Difficulty Somewhat difficult Not difficult at all  Not difficult at all

## 2024-02-27 NOTE — Telephone Encounter (Signed)
 Patient seen by Harrie Cedar, NP. Dr. Sherre

## 2024-02-27 NOTE — Assessment & Plan Note (Addendum)
 Major depressive disorder and anxiety disorder Increased irritability, anxiety, and worsening depression. Adverse reactions to Zoloft necessitated transition from Wellbutrin  to Trintellix. Trintellix chosen for efficacy in depression and anxiety. Flowsheet Row Telemedicine from 02/27/2024 in [facility]  PHQ-9 Total Score 9    - Prescribed Trintellix 5 mg once daily in the morning. - Reduced Wellbutrin  from 300 mg to 150 mg for two weeks, then 150 mg every other day for two weeks. - Follow up in 3-4 weeks to assess medication efficacy and side effects. - Consider increasing Trintellix to 10 mg if needed after Wellbutrin  is discontinued

## 2024-02-27 NOTE — Progress Notes (Signed)
 Virtual Visit via Video Note   This visit type was conducted per patient request This format is felt to be appropriate for this patient at this time.  All issues noted in this document were discussed and addressed.  A limited physical exam was performed with this format.  A verbal consent was obtained for the virtual visit.   Date:  02/27/2024   ID:  Monique Kelly, Monique Kelly Dec 02, 1955, MRN [id number]  Patient Location: Home Provider Location: Office/Clinic  PCP:  Sherre Clapper, MD   Chief Complaint  Patient presents with   Depression     History of Present Illness:    Discussed the use of AI scribe software for clinical note transcription with the patient, who gave verbal consent to proceed.  History of Present Illness   Monique Kelly is a 68 year old female who presents with increased irritability, anxiety, and worsening depression.  Mood disturbance - Increased irritability and anxiety, easily annoyed, particularly with her dogs - Feels nervous, anxious, or on edge nearly every day - Difficulty controlling worry - Relaxation is challenging; often feels restless and finds it hard to sit still  Depressive symptoms - Worsening depression, with more than half the days marked by feeling down, depressed, or hopeless - Fatigue and low energy - Difficulty concentrating on tasks such as reading or watching television - No changes in appetite - No feelings of failure - No thoughts of self-harm in the past two weeks  Adverse medication reaction - History of adverse reaction to Zoloft, which caused suicidal ideation and was subsequently discontinued     Past Medical History:  Diagnosis Date   A-fib (HCC) 09/08/2015   Allergy 2020   Nitroglycerin  and latex   Anemia 2023   Blood work   Atrial fibrillation [facility]) 2016   Sees Dr. Lynwood Rakers. Cardiac ablation, loop recorder (removed in 2022)   Atrial fibrillation with rapid ventricular response (HCC) 04/2015    Breast cancer (HCC)    right lumpectomy   BRONCHITIS 03/10/2009   Qualifier: Diagnosis of  By: Perley MD, Ronal Dines    Chest pain with moderate risk for cardiac etiology 04/28/2015   Dehydration 06/18/2009   Qualifier: Diagnosis of  By: Nonnie ROSALEA Iha     Depression    Fall 07/20/2021   Family history of breast cancer    Family history of colon cancer    Fibromyalgia 2010   Heart murmur 2016   Afib   Hyperlipidemia    Hypertension 2023   Repatha    Hypothyroidism (acquired) 2006   s/p thyroidectomy for nodules   Morbid obesity (HCC) 08/10/2015   Obesity    gastric bpg 2011, weight then 400 lbs, lost 100 lbs in 60 days.   Obstructive sleep apnea    does not use cpap, does not have a following physician   Paroxysmal atrial fibrillation (HCC) 08/10/2015   Sleep apnea 1999   Ulcer 2014    Past Surgical History:  Procedure Laterality Date   ABDOMINAL HYSTERECTOMY  1992   total.    APPENDECTOMY     BREAST BIOPSY Left 08/04/2022   MM LT BREAST BX W LOC DEV 1ST LESION IMAGE BX SPEC STEREO GUIDE 08/04/2022 [facility]   BREAST BIOPSY Left 08/12/2022   MM LT BREAST BX W LOC DEV 1ST LESION IMAGE BX SPEC STEREO GUIDE 08/12/2022 [facility]   BREAST SURGERY  2019   right lumpectomy    CHOLECYSTECTOMY     COLONOSCOPY  ELECTROPHYSIOLOGIC STUDY N/A 09/08/2015   Procedure: Atrial Fibrillation Ablation;  Surgeon: Lynwood Rakers, MD;  [facility]: [facility] CV [facility];  Service: Cardiovascular;  Laterality: N/A;   EP IMPLANTABLE DEVICE N/A 12/17/2015   Procedure: Loop Recorder Insertion;  Surgeon: Lynwood Rakers, MD;  Location: [facility] INVASIVE CV LAB;  Service: Cardiovascular;  Laterality: N/A;   ESOPHAGOGASTRODUODENOSCOPY     EXPLORATORY LAPAROTOMY WITH ABDOMINAL MASS EXCISION  2018   GASTRIC BYPASS  2011   lost 100 lbs, but gained some back.   HERNIA REPAIR  2023   implantable loop recorder removal  03/25/2020   removed 2022   NECK SURGERY  2005   laminectomy?    THYROIDECTOMY  2006   for nodules ( hot and cold nodules) Goiter compressing trachea.    Family History  Problem Relation Age of Onset   Cancer Mother        breast   Hearing loss Mother    Obesity Mother    Heart failure Father    Cancer Father        colon   COPD Father    Heart disease Father    Obesity Father    Stroke Father    Coronary artery disease Brother 41   Heart failure Maternal Grandmother    Heart failure Maternal Grandfather     Social History   Socioeconomic History   Marital status: Single    Spouse name: Not on file   Number of children: Not on file   Years of education: Not on file   Highest education level: Bachelor's degree (e.g., BA, AB, BS)  Occupational History   Occupation: Economist  Tobacco Use   Smoking status: Never   Smokeless tobacco: Never  Vaping Use   Vaping status: Never Used  Substance and Sexual Activity   Alcohol use: Yes    Alcohol/week: 1.0 standard drink of alcohol    Types: 1 Standard drinks or equivalent per week    Comment: rare glass of wine   Drug use: No   Sexual activity: Not Currently  Other Topics Concern   Not on file  Social History Narrative   Pt lives in [place] alone. 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   Social Drivers of Health   Financial Resource Strain: Medium Risk (11/20/2023)   Overall Financial Resource Strain (CARDIA)    Difficulty of Paying Living Expenses: Somewhat hard  Food Insecurity: Food Insecurity Present (11/20/2023)   Hunger Vital Sign    Worried About Running Out of Food in the Last Year: Sometimes true    Ran Out of Food in the Last Year: Sometimes true  Transportation Needs: No Transportation Needs (11/20/2023)   PRAPARE - Administrator, Civil Service (Medical): No    Lack of Transportation (Non-Medical): No  Physical Activity: Inactive (11/20/2023)   Exercise Vital Sign    Days of Exercise per Week: 0 days    Minutes of  Exercise per Session: Not on file  Stress: Stress Concern Present (11/20/2023)   [employer] of Occupational Health - Occupational Stress Questionnaire    Feeling of Stress: To some extent  Social Connections: Socially Isolated (11/20/2023)   Social Connection and Isolation Panel    Frequency of Communication with Friends and Family: More than three times a week    Frequency of Social Gatherings with Friends and Family: Not on file    Attends Religious Services: Never    Active Member of [employer]  or Organizations: No    Attends Banker Meetings: Not on file    Marital Status: Never married  Intimate Partner Violence: Not At Risk (10/30/2023)   Humiliation, Afraid, Rape, and Kick questionnaire    Fear of Current or Ex-Partner: No    Emotionally Abused: No    Physically Abused: No    Sexually Abused: No    Outpatient Medications Prior to Visit  Medication Sig Dispense Refill   buprenorphine  (BUTRANS ) 10 MCG/HR PTWK Place 1 patch onto the skin once a week. 4 patch 2   diltiazem  (CARDIZEM  CD) 120 MG 24 hr capsule TAKE ONE CAPSULE BY MOUTH DAILY 90 capsule 0   ELIQUIS  5 MG TABS tablet TAKE ONE TABLET BY MOUTH TWICE DAILY 60 tablet 2   Evolocumab  (REPATHA  SURECLICK) 140 MG/ML SOAJ INJECT 140 MG INTO THE SKIN EVERY 14 DAYS 6 mL 3   furosemide  (LASIX ) 40 MG tablet Take 1 tablet (40 mg total) by mouth daily. 90 tablet 2   gabapentin  (NEURONTIN ) 600 MG tablet Take 1 tablet (600 mg total) by mouth 4 (four) times daily. 360 tablet 1   letrozole (FEMARA) 2.5 MG tablet TAKE ONE TABLET BY MOUTH EVERY DAY 90 tablet 3   levothyroxine  (SYNTHROID ) 75 MCG tablet TAKE ONE TABLET BY MOUTH DAILY 90 tablet 1   omeprazole  (PRILOSEC) 40 MG capsule Take 1 capsule (40 mg total) by mouth in the morning and at bedtime. 180 capsule 3   potassium chloride  SA (KLOR-CON  M20) 20 MEQ tablet Take 1 tablet (20 mEq total) by mouth daily. 90 tablet 2   Zoledronic  Acid (RECLAST  IV) Inject into the vein.      buPROPion  (WELLBUTRIN  XL) 300 MG 24 hr tablet Take 1 tablet (300 mg total) by mouth daily.     No facility-administered medications prior to visit.    Allergies  Allergen Reactions   Latex Itching and Rash   Nitroglycerin      Hypotension, difficulty maintaining conciousness Hypotension, difficulty maintaining conciousness Hypotension, difficulty maintaining conciousness   Wound Dressings Dermatitis, Itching and Rash     Social History   Tobacco Use   Smoking status: Never   Smokeless tobacco: Never  Vaping Use   Vaping status: Never Used  Substance Use Topics   Alcohol use: Yes    Alcohol/week: 1.0 standard drink of alcohol    Types: 1 Standard drinks or equivalent per week    Comment: rare glass of wine   Drug use: No     Review of Systems  Constitutional:  Negative for chills, fever and malaise/fatigue.  HENT:  Negative for congestion, ear pain, sinus pain and sore throat.   Eyes: Negative.   Respiratory:  Negative for cough, shortness of breath and wheezing.   Cardiovascular:  Negative for chest pain, palpitations and leg swelling.  Gastrointestinal:  Negative for constipation, diarrhea, nausea and vomiting.  Genitourinary:  Negative for dysuria, frequency and urgency.  Musculoskeletal: Negative.   Skin: Negative.   Neurological:  Positive for dizziness (recent falls). Negative for headaches.  Endo/Heme/Allergies: Negative.   Psychiatric/Behavioral:  Positive for depression. The patient is nervous/anxious.      Labs/Other Tests and Data Reviewed:    Recent Labs: 10/25/2023: ALT 9; BUN 18; Creatinine, Ser 1.03; Hemoglobin 13.9; NT-Pro BNP 409; Platelets 252; Potassium 3.6; Sodium 141   Recent Lipid Panel Lab Results  Component Value Date/Time   CHOL 126 06/29/2023 03:39 PM   TRIG 97 06/29/2023 03:39 PM   HDL 59 06/29/2023 03:39 PM  CHOLHDL 2.1 06/29/2023 03:39 PM   LDLCALC 49 06/29/2023 03:39 PM    Wt Readings from Last 3 Encounters:  01/29/24 229 lb  (103.9 kg)  01/25/24 227 lb (103 kg)  01/01/24 237 lb (107.5 kg)     Objective:    Vital Signs:  There were no vitals taken for this visit.   Physical Exam Constitutional:      Appearance: She is not ill-appearing.  Neurological:     Mental Status: She is alert and oriented to person, place, and time.  Psychiatric:        Mood and Affect: Mood normal.        Thought Content: Thought content normal.      ASSESSMENT & PLAN:   Moderate recurrent major depression (HCC) Major depressive disorder and anxiety disorder Increased irritability, anxiety, and worsening depression. Adverse reactions to Zoloft necessitated transition from Wellbutrin  to Trintellix. Trintellix chosen for efficacy in depression and anxiety. Flowsheet Row Telemedicine from 02/27/2024 in [facility]  PHQ-9 Total Score 9    - Prescribed Trintellix 5 mg once daily in the morning. - Reduced Wellbutrin  from 300 mg to 150 mg for two weeks, then 150 mg every other day for two weeks. - Follow up in 3-4 weeks to assess medication efficacy and side effects. - Consider increasing Trintellix to 10 mg if needed after Wellbutrin  is discontinued  Balance problem Balance disturbance, possible medication or dehydration related Balance disturbances potentially related to medication side effects or dehydration. Gabapentin  could contribute to balance issues, especially with dehydration or electrolyte imbalances. No definitive cause identified. - Advised to ensure adequate hydration to prevent dehydration and electrolyte imbalances.      GAD (generalized anxiety disorder) Patient has several complaints that seem to be related to anxiety. - Wellbutrin  was recently increased to 300 mg once daily and she has noticed that she is much more angry than usual - Discussed decreasing the Wellbutrin  to 150 mg for 2 weeks starting a new medication, Trintellix 5 mg for 2 weeks and then taking the Wellbutrin  every other day  for 2 weeks before discontinuing. Wellbutrin  has been known to increase anxiety in some patients.  FU in 3-4 weeks    02/27/2024    3:04 PM 02/27/2024    3:03 PM 02/27/2024    2:40 PM 11/27/2023    1:29 PM  GAD 7 : Generalized Anxiety Score  Nervous, Anxious, on Edge 2  2 1   Control/stop worrying 3  3 2   Worry too much - different things 2  2 2   Trouble relaxing 3  3 3   Restless 0  0 0  Easily annoyed or irritable 3  3 2   Afraid - awful might happen 0   0  Total GAD 7 Score 13   10  Anxiety Difficulty Somewhat difficult Not difficult at all  Not difficult at all          Follow Up:  In Person in 1 month(s)  Signed, Harrie Cedar, FNP [facility] [phone]  [facility] [place]

## 2024-02-27 NOTE — Assessment & Plan Note (Signed)
 Balance disturbance, possible medication or dehydration related Balance disturbances potentially related to medication side effects or dehydration. Gabapentin  could contribute to balance issues, especially with dehydration or electrolyte imbalances. No definitive cause identified. - Advised to ensure adequate hydration to prevent dehydration and electrolyte imbalances.

## 2024-03-16 ENCOUNTER — Other Ambulatory Visit: Payer: Self-pay | Admitting: Family Medicine

## 2024-03-22 ENCOUNTER — Other Ambulatory Visit: Payer: Self-pay | Admitting: Family Medicine

## 2024-03-22 DIAGNOSIS — G894 Chronic pain syndrome: Secondary | ICD-10-CM

## 2024-03-22 NOTE — Telephone Encounter (Signed)
 Copied from CRM #[id number]. Topic: Clinical - Medication Refill >> Mar 22, 2024  1:40 PM Joesph B wrote: Medication:  buprenorphine  (BUTRANS ) 10 MCG/HR PTWK  Patient is on her last day for the patch and needs this by today.    Has the patient contacted their pharmacy? Yes (Agent: If no, request that the patient contact the pharmacy for the refill. If patient does not wish to contact the pharmacy document the reason why and proceed with request.) (Agent: If yes, when and what did the pharmacy advise?)  This is the patient's preferred pharmacy:  [facility] [address] [place] - [address] [facility] [place] [address] Phone: [phone] Fax: [phone]  Is this the correct pharmacy for this prescription? Yes If no, delete pharmacy and type the correct one.   Has the prescription been filled recently? Yes  Is the patient out of the medication? Yes  Has the patient been seen for an appointment in the last year OR does the patient have an upcoming appointment? Yes  Can we respond through MyChart? No  Agent: Please be advised that Rx refills may take up to 3 business days. We ask that you follow-up with your pharmacy.

## 2024-03-24 ENCOUNTER — Other Ambulatory Visit: Payer: Self-pay | Admitting: Family Medicine

## 2024-03-24 DIAGNOSIS — G894 Chronic pain syndrome: Secondary | ICD-10-CM

## 2024-03-25 ENCOUNTER — Encounter: Payer: Self-pay | Admitting: Radiology

## 2024-03-25 MED ORDER — BUPRENORPHINE 10 MCG/HR TD PTWK: 1.0000 | MEDICATED_PATCH | TRANSDERMAL | 2 refills | Status: AC

## 2024-04-03 ENCOUNTER — Other Ambulatory Visit: Payer: Self-pay

## 2024-04-03 ENCOUNTER — Other Ambulatory Visit: Payer: Self-pay | Admitting: Family Medicine

## 2024-04-03 MED ORDER — DILTIAZEM HCL ER COATED BEADS 120 MG PO CP24: 120.0000 mg | ORAL_CAPSULE | Freq: Every day | ORAL | 0 refills | Status: DC

## 2024-04-03 NOTE — Telephone Encounter (Signed)
 Copied from CRM [id number]. Topic: Clinical - Medication Refill >> Apr 03, 2024  2:46 PM Winona R wrote: Medication: diltiazem  (CARDIZEM  CD) 120 MG 24 hr capsule  Has the patient contacted their pharmacy? Yes (Agent: If no, request that the patient contact the pharmacy for the refill. If patient does not wish to contact the pharmacy document the reason why and proceed with request.) (Agent: If yes, when and what did the pharmacy advise?)  This is the patient's preferred pharmacy:  [facility] [address] [place] - [address] [facility] [place] [address] Phone: [phone] Fax: [phone]   Is this the correct pharmacy for this prescription? Yes If no, delete pharmacy and type the correct one.   Has the prescription been filled recently? Yes  Is the patient out of the medication? No  Has the patient been seen for an appointment in the last year OR does the patient have an upcoming appointment? Yes  Can we respond through MyChart? Yes  Agent: Please be advised that Rx refills may take up to 3 business days. We ask that you follow-up with your pharmacy.

## 2024-04-08 ENCOUNTER — Other Ambulatory Visit: Payer: Self-pay | Admitting: Family Medicine

## 2024-04-08 MED ORDER — LEVOTHYROXINE SODIUM 75 MCG PO TABS: 75.0000 ug | ORAL_TABLET | Freq: Every day | ORAL | 1 refills | Status: AC

## 2024-04-08 NOTE — Telephone Encounter (Signed)
 Copied from CRM #[id number]. Topic: Clinical - Medication Refill >> Apr 08, 2024 10:16 AM Amber H wrote: Medication: levothyroxine  (SYNTHROID ) 75 MCG tablet        Has the patient contacted their pharmacy? Yes, had issues getting med at old pharmacy and was told she needed  (Agent: If no, request that the patient contact the pharmacy for the refill. If patient does not wish to contact the pharmacy document the reason why and proceed with request.) (Agent: If yes, when and what did the pharmacy advise?)  This is the patient's preferred pharmacy:  [facility] [address], [place] - [address] [facility] [place] [address] Phone: [phone] Fax: [phone]  Is this the correct pharmacy for this prescription? Yes If no, delete pharmacy and type the correct one.   Has the prescription been filled recently? Yes, over 1 month ago.   Is the patient out of the medication? Yes  Has the patient been seen for an appointment in the last year OR does the patient have an upcoming appointment? Yes  Can we respond through MyChart? Yes  Agent: Please be advised that Rx refills may take up to 3 business days. We ask that you follow-up with your pharmacy.

## 2024-04-16 ENCOUNTER — Encounter: Payer: Self-pay | Admitting: Family Medicine

## 2024-04-24 ENCOUNTER — Ambulatory Visit: Payer: Self-pay

## 2024-04-24 ENCOUNTER — Ambulatory Visit: Admitting: Family Medicine

## 2024-04-24 NOTE — Telephone Encounter (Signed)
 FYI Only or Action Required?: FYI only for provider: appointment scheduled on 12/30.  Patient was last seen in primary care on 02/27/2024 by Teressa Harrie HERO, FNP.  Called Nurse Triage reporting Appointment.   Triage Disposition: Information or Advice Only Call  Patient/caregiver understands and will follow disposition?: Yes            Copied from CRM #[id number]. Topic: Clinical - Red Word Triage >> Apr 24, 2024  9:11 AM Antwanette L wrote: Red Word that prompted transfer to Nurse Triage:Patient is calling to r/s appt for 12/3. Patient reports a fall two weeks ago that resulted in fainting. Patient mentioned vitamin B12 deficiency. Patient said  she feels like bugs are crawling under the skin, bruising on both legs, and visible/ popping veins. Reason for Disposition  Health information question, no triage required and triager able to answer question  Answer Assessment - Initial Assessment Questions 1. REASON FOR CALL: What is the main reason for your call? or How can I best help you?       Patient calling to reschedule her 12/3 appointment today due to transportation issues. She was scheduled for 12/30 per her request as she only wishes to be seen by PCP. She reports no other symptoms or concerns from her fall x 2 weeks ago.  Appointment scheduled for evaluation. Patient agrees with plan of care, and will call back if anything changes, or if symptoms worsen.  Protocols used: Information Only Call - No Triage-A-AH

## 2024-05-01 ENCOUNTER — Ambulatory Visit: Admitting: Family Medicine

## 2024-05-01 DIAGNOSIS — H524 Presbyopia: Secondary | ICD-10-CM | POA: Diagnosis not present

## 2024-05-01 DIAGNOSIS — H25813 Combined forms of age-related cataract, bilateral: Secondary | ICD-10-CM | POA: Diagnosis not present

## 2024-05-01 DIAGNOSIS — H5231 Anisometropia: Secondary | ICD-10-CM | POA: Diagnosis not present

## 2024-05-01 DIAGNOSIS — H52223 Regular astigmatism, bilateral: Secondary | ICD-10-CM | POA: Diagnosis not present

## 2024-05-01 DIAGNOSIS — H43393 Other vitreous opacities, bilateral: Secondary | ICD-10-CM | POA: Diagnosis not present

## 2024-05-21 ENCOUNTER — Encounter: Payer: Self-pay | Admitting: Family Medicine

## 2024-05-21 ENCOUNTER — Ambulatory Visit (INDEPENDENT_AMBULATORY_CARE_PROVIDER_SITE_OTHER)

## 2024-05-21 ENCOUNTER — Ambulatory Visit: Admitting: Family Medicine

## 2024-05-21 VITALS — BP 106/60 | HR 84 | Temp 97.9°F | Resp 16 | Ht 67.0 in | Wt 215.6 lb

## 2024-05-21 VITALS — BP 106/60 | HR 84 | Temp 97.9°F | Ht 67.0 in | Wt 215.6 lb

## 2024-05-21 DIAGNOSIS — F331 Major depressive disorder, recurrent, moderate: Secondary | ICD-10-CM

## 2024-05-21 DIAGNOSIS — R202 Paresthesia of skin: Secondary | ICD-10-CM | POA: Diagnosis not present

## 2024-05-21 DIAGNOSIS — R42 Dizziness and giddiness: Secondary | ICD-10-CM | POA: Insufficient documentation

## 2024-05-21 DIAGNOSIS — E039 Hypothyroidism, unspecified: Secondary | ICD-10-CM

## 2024-05-21 DIAGNOSIS — R1319 Other dysphagia: Secondary | ICD-10-CM | POA: Insufficient documentation

## 2024-05-21 DIAGNOSIS — R634 Abnormal weight loss: Secondary | ICD-10-CM | POA: Insufficient documentation

## 2024-05-21 DIAGNOSIS — Z0000 Encounter for general adult medical examination without abnormal findings: Secondary | ICD-10-CM | POA: Diagnosis not present

## 2024-05-21 DIAGNOSIS — R1111 Vomiting without nausea: Secondary | ICD-10-CM | POA: Insufficient documentation

## 2024-05-21 DIAGNOSIS — F411 Generalized anxiety disorder: Secondary | ICD-10-CM | POA: Diagnosis not present

## 2024-05-21 DIAGNOSIS — I951 Orthostatic hypotension: Secondary | ICD-10-CM

## 2024-05-21 DIAGNOSIS — G894 Chronic pain syndrome: Secondary | ICD-10-CM

## 2024-05-21 DIAGNOSIS — I4891 Unspecified atrial fibrillation: Secondary | ICD-10-CM | POA: Diagnosis not present

## 2024-05-21 DIAGNOSIS — E782 Mixed hyperlipidemia: Secondary | ICD-10-CM | POA: Diagnosis not present

## 2024-05-21 DIAGNOSIS — L299 Pruritus, unspecified: Secondary | ICD-10-CM | POA: Diagnosis not present

## 2024-05-21 MED ORDER — VORTIOXETINE HBR 10 MG PO TABS: 10.0000 mg | ORAL_TABLET | Freq: Every day | ORAL | 0 refills | Status: DC

## 2024-05-21 MED ORDER — HYDROXYZINE PAMOATE 25 MG PO CAPS: 25.0000 mg | ORAL_CAPSULE | Freq: Three times a day (TID) | ORAL | 2 refills | Status: AC | PRN

## 2024-05-21 MED ORDER — VORTIOXETINE HBR 10 MG PO TABS: 10.0000 mg | ORAL_TABLET | Freq: Every day | ORAL | Status: AC

## 2024-05-21 NOTE — Assessment & Plan Note (Addendum)
 Recurrent major depressive disorder Ongoing irritability and anger. Difficulty discerning medication effects. Discussed genetic predisposition. - restart Trintellix  to 10 mg daily. - Provided samples of higher dose Trintellix . - Encouraged participation in social activities.

## 2024-05-21 NOTE — Progress Notes (Signed)
 "  Chief Complaint  Patient presents with   Annual Exam     Subjective:   Monique Kelly is a 68 y.o. female who presents for a Medicare Annual Wellness Visit.  Visit info / Clinical Intake: Medicare Wellness Visit Type:: Subsequent Annual Wellness Visit Persons participating in visit and providing information:: patient Medicare Wellness Visit Mode:: In-person (required for WTM) Interpreter Needed?: No Pre-visit prep was completed: no AWV questionnaire completed by patient prior to visit?: no Living arrangements:: (!) lives alone Patient's Overall Health Status Rating: (!) fair Typical amount of pain: none Does pain affect daily life?: no Are you currently prescribed opioids?: (!) yes (pain patch)  Dietary Habits and Nutritional Risks How many meals a day?: 2 Eats fruit and vegetables daily?: (!) no Most meals are obtained by: preparing own meals In the last 2 weeks, have you had any of the following?: (!) unintentional weight loss; nausea, vomiting, diarrhea (just saw Dr. Sherre before this visit) Diabetic:: no  Functional Status Activities of Daily Living (to include ambulation/medication): Independent Ambulation: Independent Medication Administration: Independent Home Management (perform basic housework or laundry): Independent Manage your own finances?: yes Primary transportation is: driving Concerns about vision?: no *vision screening is required for WTM* Concerns about hearing?: no  Fall Screening Falls in the past year?: 1 Number of falls in past year: 1 Was there an injury with Fall?: 0 Fall Risk Category Calculator: 2 Patient Fall Risk Level: Moderate Fall Risk  Fall Risk Patient at Risk for Falls Due to: History of fall(s) Fall risk Follow up: Falls evaluation completed; Education provided; Falls prevention discussed  Home and Transportation Safety: All rugs have non-skid backing?: yes All stairs or steps have railings?: yes Grab bars in the bathtub or  shower?: yes Have non-skid surface in bathtub or shower?: yes Good home lighting?: yes Regular seat belt use?: yes Hospital stays in the last year:: no  Cognitive Assessment Difficulty concentrating, remembering, or making decisions? : no Will 6CIT or Mini Cog be Completed: yes What year is it?: 0 points What month is it?: 0 points Give patient an address phrase to remember (5 components): [address] [place] About what time is it?: 0 points Count backwards from 20 to 1: 0 points Say the months of the year in reverse: 0 points Repeat the address phrase from earlier: 0 points 6 CIT Score: 0 points  Advance Directives (For Healthcare) Does Patient Have a Medical Advance Directive?: Yes Type of Advance Directive: Healthcare Power of Attorney Copy of Healthcare Power of Attorney in Chart?: No - copy requested  Reviewed/Updated  Reviewed/Updated: Reviewed All (Medical, Surgical, Family, Medications, Allergies, Care Teams, Patient Goals)    Allergies (verified) Latex, Nitroglycerin , and Wound dressings   Current Medications (verified) Outpatient Encounter Medications as of 05/21/2024  Medication Sig   buprenorphine  (BUTRANS ) 10 MCG/HR PTWK Place 1 patch onto the skin once a week.   buprenorphine  (BUTRANS ) 10 MCG/HR PTWK Place 1 patch onto the skin once a week.   buPROPion  (WELLBUTRIN  XL) 150 MG 24 hr tablet Take 1 tablet (150 mg total) by mouth daily.   diltiazem  (CARDIZEM  CD) 120 MG 24 hr capsule Take 1 capsule (120 mg total) by mouth daily.   ELIQUIS  5 MG TABS tablet TAKE ONE TABLET BY MOUTH TWICE DAILY   Evolocumab  (REPATHA  SURECLICK) 140 MG/ML SOAJ INJECT 140 MG INTO THE SKIN EVERY 14 DAYS   furosemide  (LASIX ) 40 MG tablet Take 1 tablet (40 mg total) by mouth daily.  gabapentin  (NEURONTIN ) 600 MG tablet Take 1 tablet (600 mg total) by mouth 4 (four) times daily.   letrozole (FEMARA) 2.5 MG tablet TAKE ONE TABLET BY MOUTH EVERY DAY   levothyroxine  (SYNTHROID ) 75 MCG  tablet Take 1 tablet (75 mcg total) by mouth daily.   omeprazole  (PRILOSEC) 40 MG capsule Take 1 capsule (40 mg total) by mouth in the morning and at bedtime.   potassium chloride  SA (KLOR-CON  M20) 20 MEQ tablet Take 1 tablet (20 mEq total) by mouth daily.   Zoledronic  Acid (RECLAST  IV) Inject into the vein.   hydrOXYzine (VISTARIL) 25 MG capsule Take 1 capsule (25 mg total) by mouth every 8 (eight) hours as needed. (Patient not taking: Reported on 05/21/2024)   vortioxetine  HBr (TRINTELLIX ) 10 MG TABS tablet Take 1 tablet (10 mg total) by mouth daily. (Patient not taking: Reported on 05/21/2024)   vortioxetine  HBr (TRINTELLIX ) 10 MG TABS tablet Take 1 tablet (10 mg total) by mouth daily. (Patient not taking: Reported on 05/21/2024)   No facility-administered encounter medications on file as of 05/21/2024.    History: Past Medical History:  Diagnosis Date   A-fib (HCC) 09/08/2015   Allergy 2020   Nitroglycerin  and latex   Anemia 2023   Blood work   Atrial fibrillation [facility]) 2016   Sees Dr. Lynwood Rakers. Cardiac ablation, loop recorder (removed in 2022)   Atrial fibrillation with rapid ventricular response (HCC) 04/2015   Breast cancer (HCC)    right lumpectomy   BRONCHITIS 03/10/2009   Qualifier: Diagnosis of  By: Perley MD, Ronal Dines    Chest pain with moderate risk for cardiac etiology 04/28/2015   Dehydration 06/18/2009   Qualifier: Diagnosis of  By: Nonnie ROSALEA Iha     Depression    Fall 07/20/2021   Family history of breast cancer    Family history of colon cancer    Fibromyalgia 2010   Heart murmur 2016   Afib   Hyperlipidemia    Hypertension 2023   Repatha    Hypothyroidism (acquired) 2006   s/p thyroidectomy for nodules   Morbid obesity (HCC) 08/10/2015   Obesity    gastric bpg 2011, weight then 400 lbs, lost 100 lbs in 60 days.   Obstructive sleep apnea    does not use cpap, does not have a following physician   Paroxysmal atrial fibrillation (HCC) 08/10/2015    Sleep apnea 1999   Ulcer 2014   Past Surgical History:  Procedure Laterality Date   ABDOMINAL HYSTERECTOMY  1992   total.    APPENDECTOMY     BREAST BIOPSY Left 08/04/2022   MM LT BREAST BX W LOC DEV 1ST LESION IMAGE BX SPEC STEREO GUIDE 08/04/2022 [facility]   BREAST BIOPSY Left 08/12/2022   MM LT BREAST BX W LOC DEV 1ST LESION IMAGE BX SPEC STEREO GUIDE 08/12/2022 [facility]   BREAST SURGERY  2019   right lumpectomy    CHOLECYSTECTOMY     COLONOSCOPY     ELECTROPHYSIOLOGIC STUDY N/A 09/08/2015   Procedure: Atrial Fibrillation Ablation;  Surgeon: Lynwood Rakers, MD;  Location: [facility] INVASIVE CV LAB;  Service: Cardiovascular;  Laterality: N/A;   EP IMPLANTABLE DEVICE N/A 12/17/2015   Procedure: Loop Recorder Insertion;  Surgeon: Lynwood Rakers, MD;  Location: [facility] INVASIVE CV LAB;  Service: Cardiovascular;  Laterality: N/A;   ESOPHAGOGASTRODUODENOSCOPY     EXPLORATORY LAPAROTOMY WITH ABDOMINAL MASS EXCISION  2018   GASTRIC BYPASS  2011   lost 100 lbs, but gained some back.  HERNIA REPAIR  2023   implantable loop recorder removal  03/25/2020   removed 2022   NECK SURGERY  2005   laminectomy?   THYROIDECTOMY  2006   for nodules ( hot and cold nodules) Goiter compressing trachea.   Family History  Problem Relation Age of Onset   Cancer Mother        breast   Hearing loss Mother    Obesity Mother    Heart failure Father    Cancer Father        colon   COPD Father    Heart disease Father    Obesity Father    Stroke Father    Coronary artery disease Brother 16   Heart failure Maternal Grandmother    Heart failure Maternal Grandfather    Social History   Occupational History   Occupation: Economist  Tobacco Use   Smoking status: Never   Smokeless tobacco: Never  Vaping Use   Vaping status: Never Used  Substance and Sexual Activity   Alcohol use: Yes    Alcohol/week: 1.0 standard drink of alcohol    Types: 1 Standard drinks or equivalent per  week    Comment: rare glass of wine   Drug use: No   Sexual activity: Not Currently   Tobacco Counseling Counseling given: Not Answered  SDOH Screenings   Food Insecurity: Food Insecurity Present (05/21/2024)  Housing: Unknown (05/21/2024)  Transportation Needs: No Transportation Needs (05/21/2024)  Utilities: At Risk (05/21/2024)  Alcohol Screen: Low Risk (05/17/2024)  Depression (PHQ2-9): Medium Risk (05/21/2024)  Financial Resource Strain: High Risk (05/17/2024)  Physical Activity: Inactive (05/21/2024)  Social Connections: Socially Isolated (05/21/2024)  Stress: Stress Concern Present (05/21/2024)  Tobacco Use: Low Risk (05/21/2024)  Health Literacy: Adequate Health Literacy (05/21/2024)   See flowsheets for full screening details  Depression Screen PHQ 2 & 9 Depression Scale- Over the past 2 weeks, how often have you been bothered by any of the following problems? Little interest or pleasure in doing things: 1 Feeling down, depressed, or hopeless (PHQ Adolescent also includes...irritable): 1 PHQ-2 Total Score: 2 Trouble falling or staying asleep, or sleeping too much: 0 Feeling tired or having little energy: 3 Poor appetite or overeating (PHQ Adolescent also includes...weight loss): 0 Feeling bad about yourself - or that you are a failure or have let yourself or your family down: 0 Trouble concentrating on things, such as reading the newspaper or watching television (PHQ Adolescent also includes...like school work): 0 Moving or speaking so slowly that other people could have noticed. Or the opposite - being so fidgety or restless that you have been moving around a lot more than usual: 0 Thoughts that you would be better off dead, or of hurting yourself in some way: 0 PHQ-9 Total Score: 5 If you checked off any problems, how difficult have these problems made it for you to do your work, take care of things at home, or get along with other people?: Somewhat  difficult  Depression Treatment Depression Interventions/Treatment : Medication     Goals Addressed   None          Objective:    Today's Vitals   05/21/24 1417  BP: 106/60  Pulse: 84  Temp: 97.9 F (36.6 C)  SpO2: 97%  Weight: 215 lb 9.6 oz (97.8 kg)  Height: 5' 7 (1.702 m)   Body mass index is 33.77 kg/m.  Hearing/Vision screen No results found. Immunizations and Health Maintenance Health Maintenance  Topic Date  Due   DTaP/Tdap/Td (4 - Td or Tdap) 07/08/2019   Zoster Vaccines- Shingrix (2 of 2) 04/21/2020   COVID-19 Vaccine (4 - 2025-26 season) 01/22/2024   Bone Density Scan  06/01/2024   Mammogram  07/24/2024   Medicare Annual Wellness (AWV)  05/21/2025   Colonoscopy  07/08/2026   Pneumococcal Vaccine: 50+ Years  Completed   Influenza Vaccine  Completed   Meningococcal B Vaccine  Aged Out   Hepatitis C Screening  Discontinued        Assessment/Plan:  This is a routine wellness examination for [place].  Patient Care Team: Sherre Clapper, MD as PCP - General (Family Medicine) Inocencio Soyla Lunger, MD as PCP - Electrophysiology (Cardiology) Bernie Lamar PARAS, MD as PCP - Cardiology (Cardiology) Cornelius Wanda DEL, MD as Consulting Physician (Oncology) Joesph Lynwood DEL, MD as Consulting Physician (General Surgery) Misenheimer, Evalene, MD as Consulting Physician (Unknown Physician Specialty) Micky Anes, DDS as Referring Physician (Orthodontics) Lininger, Ozell BIRCH., MD as Referring Physician (Surgery)  I have personally reviewed and noted the following in the patients chart:   Medical and social history Use of alcohol, tobacco or illicit drugs  Current medications and supplements including opioid prescriptions. Functional ability and status Nutritional status Physical activity Advanced directives List of other physicians Hospitalizations, surgeries, and ER visits in previous 12 months Vitals Screenings to include cognitive, depression, and  falls Referrals and appointments  No orders of the defined types were placed in this encounter.  In addition, I have reviewed and discussed with patient certain preventive protocols, quality metrics, and best practice recommendations. A written personalized care plan for preventive services as well as general preventive health recommendations were provided to patient.   [place] Mailman, [place]   05/21/2024   Return in 1 year (on 05/21/2025).  After Visit Summary: (In Person-Declined) Patient declined AVS at this time.  Nurse Notes: I spent 20 minutes with patient. Patient has no questions or concerns at this time.  "

## 2024-05-21 NOTE — Assessment & Plan Note (Addendum)
 Management per specialist.

## 2024-05-21 NOTE — Assessment & Plan Note (Addendum)
 Taking levothyroxine  as prescribed. No new symptoms. - Continue current levothyroxine  regimen. Orders:   TSH

## 2024-05-21 NOTE — Assessment & Plan Note (Addendum)
 Well controlled.  No changes to medicines. Continue repatha  Continue to work on eating a healthy diet and exercise.  Orders:   CBC with Differential/Platelet   Comprehensive metabolic panel with GFR   Lipid panel

## 2024-05-21 NOTE — Assessment & Plan Note (Addendum)
 Post-bariatric surgery gastrointestinal complications Recent vomiting and inability to retain food. Significant weight loss. Possible esophageal dilation needed. - Referred to Dr. Nicolina for evaluation and possible esophageal dilation. - Ordered swallowing study. - Encouraged dietary modifications including smoothies and high-protein snacks.

## 2024-05-21 NOTE — Assessment & Plan Note (Addendum)
 The current medical regimen is effective;  continue present plan and medications.

## 2024-05-21 NOTE — Assessment & Plan Note (Addendum)
 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 Possible anxiety-related component. - Prescribed hydroxyzine 25 mg, up to three times daily as needed for itching and anxiety.

## 2024-05-21 NOTE — Patient Instructions (Signed)
 Monique Kelly , Thank you for taking time to come for your Medicare Wellness Visit. I appreciate your ongoing commitment to your health goals. Please review the following plan we discussed and let me know if I can assist you in the future.   These are the goals we discussed:  Goals   None     This is a list of the screening recommended for you and due dates:  Health Maintenance  Topic Date Due   DTaP/Tdap/Td vaccine (4 - Td or Tdap) 07/08/2019   Zoster (Shingles) Vaccine (2 of 2) 04/21/2020   COVID-19 Vaccine (4 - 2025-26 season) 01/22/2024   Osteoporosis screening with Bone Density Scan  06/01/2024   Breast Cancer Screening  07/24/2024   Medicare Annual Wellness Visit  05/21/2025   Colon Cancer Screening  07/08/2026   Pneumococcal Vaccine for age over 29  Completed   Flu Shot  Completed   Meningitis B Vaccine  Aged Out   Hepatitis C Screening  Discontinued    Advanced directives: yes  Next appointment: Follow up in one year for your annual wellness visit 01.06.2027 at 2:30 pm in person.    Preventive Care 79 Years and Older, Female Preventive care refers to lifestyle choices and visits with your health care provider that can promote health and wellness. What does preventive care include? A yearly physical exam. This is also called an annual well check. Dental exams once or twice a year. Routine eye exams. Ask your health care provider how often you should have your eyes checked. Personal lifestyle choices, including: Daily care of your teeth and gums. Regular physical activity. Eating a healthy diet. Avoiding tobacco and drug use. Limiting alcohol use. Practicing safe sex. Taking low-dose aspirin every day. Taking vitamin and mineral supplements as recommended by your health care provider. What happens during an annual well check? The services and screenings done by your health care provider during your annual well check will depend on your age, overall health, lifestyle  risk factors, and family history of disease. Counseling  Your health care provider may ask you questions about your: Alcohol use. Tobacco use. Drug use. Emotional well-being. Home and relationship well-being. Sexual activity. Eating habits. History of falls. Memory and ability to understand (cognition). Work and work astronomer. Reproductive health. Screening  You may have the following tests or measurements: Height, weight, and BMI. Blood pressure. Lipid and cholesterol levels. These may be checked every 5 years, or more frequently if you are over 86 years old. Skin check. Lung cancer screening. You may have this screening every year starting at age 40 if you have a 30-pack-year history of smoking and currently smoke or have quit within the past 15 years. Fecal occult blood test (FOBT) of the stool. You may have this test every year starting at age 21. Flexible sigmoidoscopy or colonoscopy. You may have a sigmoidoscopy every 5 years or a colonoscopy every 10 years starting at age 10. Hepatitis C blood test. Hepatitis B blood test. Sexually transmitted disease (STD) testing. Diabetes screening. This is done by checking your blood sugar (glucose) after you have not eaten for a while (fasting). You may have this done every 1-3 years. Bone density scan. This is done to screen for osteoporosis. You may have this done starting at age 43. Mammogram. This may be done every 1-2 years. Talk to your health care provider about how often you should have regular mammograms. Talk with your health care provider about your test results, treatment options, and  if necessary, the need for more tests. Vaccines  Your health care provider may recommend certain vaccines, such as: Influenza vaccine. This is recommended every year. Tetanus, diphtheria, and acellular pertussis (Tdap, Td) vaccine. You may need a Td booster every 10 years. Zoster vaccine. You may need this after age 39. Pneumococcal  13-valent conjugate (PCV13) vaccine. One dose is recommended after age 58. Pneumococcal polysaccharide (PPSV23) vaccine. One dose is recommended after age 46. Talk to your health care provider about which screenings and vaccines you need and how often you need them. This information is not intended to replace advice given to you by your health care provider. Make sure you discuss any questions you have with your health care provider. Document Released: 06/05/2015 Document Revised: 01/27/2016 Document Reviewed: 03/10/2015 Elsevier Interactive Patient Education  2017 [employer].  Fall Prevention in the Home Falls can cause injuries. They can happen to people of all ages. There are many things you can do to make your home safe and to help prevent falls. What can I do on the outside of my home? Regularly fix the edges of walkways and driveways and fix any cracks. Remove anything that might make you trip as you walk through a door, such as a raised step or threshold. Trim any bushes or trees on the path to your home. Use bright outdoor lighting. Clear any walking paths of anything that might make someone trip, such as rocks or tools. Regularly check to see if handrails are loose or broken. Make sure that both sides of any steps have handrails. Any raised decks and porches should have guardrails on the edges. Have any leaves, snow, or ice cleared regularly. Use sand or salt on walking paths during winter. Clean up any spills in your garage right away. This includes oil or grease spills. What can I do in the bathroom? Use night lights. Install grab bars by the toilet and in the tub and shower. Do not use towel bars as grab bars. Use non-skid mats or decals in the tub or shower. If you need to sit down in the shower, use a plastic, non-slip stool. Keep the floor dry. Clean up any water that spills on the floor as soon as it happens. Remove soap buildup in the tub or shower regularly. Attach  bath mats securely with double-sided non-slip rug tape. Do not have throw rugs and other things on the floor that can make you trip. What can I do in the bedroom? Use night lights. Make sure that you have a light by your bed that is easy to reach. Do not use any sheets or blankets that are too big for your bed. They should not hang down onto the floor. Have a firm chair that has side arms. You can use this for support while you get dressed. Do not have throw rugs and other things on the floor that can make you trip. What can I do in the kitchen? Clean up any spills right away. Avoid walking on wet floors. Keep items that you use a lot in easy-to-reach places. If you need to reach something above you, use a strong step stool that has a grab bar. Keep electrical cords out of the way. Do not use floor polish or wax that makes floors slippery. If you must use wax, use non-skid floor wax. Do not have throw rugs and other things on the floor that can make you trip. What can I do with my stairs? Do not leave any  items on the stairs. Make sure that there are handrails on both sides of the stairs and use them. Fix handrails that are broken or loose. Make sure that handrails are as long as the stairways. Check any carpeting to make sure that it is firmly attached to the stairs. Fix any carpet that is loose or worn. Avoid having throw rugs at the top or bottom of the stairs. If you do have throw rugs, attach them to the floor with carpet tape. Make sure that you have a light switch at the top of the stairs and the bottom of the stairs. If you do not have them, ask someone to add them for you. What else can I do to help prevent falls? Wear shoes that: Do not have high heels. Have rubber bottoms. Are comfortable and fit you well. Are closed at the toe. Do not wear sandals. If you use a stepladder: Make sure that it is fully opened. Do not climb a closed stepladder. Make sure that both sides of the  stepladder are locked into place. Ask someone to hold it for you, if possible. Clearly mark and make sure that you can see: Any grab bars or handrails. First and last steps. Where the edge of each step is. Use tools that help you move around (mobility aids) if they are needed. These include: Canes. Walkers. Scooters. Crutches. Turn on the lights when you go into a dark area. Replace any light bulbs as soon as they burn out. Set up your furniture so you have a clear path. Avoid moving your furniture around. If any of your floors are uneven, fix them. If there are any pets around you, be aware of where they are. Review your medicines with your doctor. Some medicines can make you feel dizzy. This can increase your chance of falling. Ask your doctor what other things that you can do to help prevent falls. This information is not intended to replace advice given to you by your health care provider. Make sure you discuss any questions you have with your health care provider. Document Released: 03/05/2009 Document Revised: 10/15/2015 Document Reviewed: 06/13/2014 Elsevier Interactive Patient Education  2017 [employer].

## 2024-05-21 NOTE — Assessment & Plan Note (Addendum)
 Post-bariatric surgery gastrointestinal complications Recent vomiting and inability to retain food. Significant weight loss. Possible esophageal dilation needed. - Referred to Dr. Nicolina for evaluation and possible esophageal dilation. - Ordered swallowing study. - Encouraged dietary modifications including smoothies and high-protein snacks. Orders:   SLP modified barium swallow; Future   DG Swallowing Func-Speech Pathology; Future   DG OP Swallowing Func-Medicare/Speech Path; Future

## 2024-05-21 NOTE — Progress Notes (Unsigned)
 "  Subjective:  Patient ID: Monique Kelly, female    DOB: 1956-02-11  Age: 68 y.o. MRN: [id number]  Chief Complaint  Patient presents with   Medical Management of Chronic Issues    HPI: Discussed the use of AI scribe software for clinical note transcription with the patient, who gave verbal consent to proceed.  History of Present Illness Monique Kelly is a 68 year old female who presents with dizziness and sensations of crawling on her head.  Dizziness and presyncope - Severe dizziness, particularly when raising arms above head (e.g., hanging a clock) - Episodes have led to near syncope and a fall onto a couch, resulting in bruising but no head injury - Dizziness is sporadic; last episode occurred about one week ago, with another episode one to two months prior - During episodes, systolic blood pressure measured below 80 mmHg and diastolic pressure at 41 mmHg - Blood pressure is not monitored daily, only during dizzy spells  Scalp paresthesia - Persistent sensation of insects crawling on the scalp, occurring both day and night - No visible evidence of lice, bed bugs, or fleas - Mother confirmed no visible findings on scalp - Symptoms occur daily  Gastrointestinal symptoms and weight loss - Difficulty retaining food, with vomiting almost immediately after eating - Significant unintentional weight loss of 22 pounds over four months (from 237 lbs in August to 215 lbs currently) - Able to tolerate potatoes and small snack packs, but struggles with other foods - History of stomach cycling and prior abdominal surgery in 2019, which may contribute to current symptoms  Bowel incontinence - History of bowel incontinence, attributed to back stimulator settings being too high - Improvement in symptoms after adjusting back stimulator - No loss of bladder control  Mood symptoms - History of depression and anxiety, currently managed with Wellbutrin  150 mg daily - Previously  trialed Trintellix , unsure of its effects - Ongoing family stress involving mother and sister - Family history of mental health disorders, including maternal grandmother's institutionalization for depression       05/21/2024    2:39 PM 05/21/2024    2:00 PM 02/27/2024    2:42 PM 01/29/2024    3:27 PM 11/27/2023    1:28 PM  Depression screen PHQ 2/9  Decreased Interest 1 1 2 3 1   Down, Depressed, Hopeless 1 1 1 3 1   PHQ - 2 Score 2 2 3 6 2   Altered sleeping 0 0 2 2 1   Tired, decreased energy 3 3 3 3 3   Change in appetite 0 0 0 3 0  Feeling bad or failure about yourself  0 0 0 0 0  Trouble concentrating 0 0 1 3 2   Moving slowly or fidgety/restless 0 0 0 0 0  Suicidal thoughts 0 0 0 0 0  PHQ-9 Score 5 5 9  17  8    Difficult doing work/chores Somewhat difficult Somewhat difficult  Somewhat difficult Somewhat difficult     Data saved with a previous flowsheet row definition        05/21/2024    2:33 PM  Fall Risk   Falls in the past year? 1  Number falls in past yr: 1  Injury with Fall? 0  Risk for fall due to : History of fall(s)  Follow up Falls evaluation completed;Education provided;Falls prevention discussed    Patient Care Team: Sherre Clapper, MD as PCP - General (Family Medicine) Inocencio Soyla Lunger, MD as PCP - Electrophysiology (Cardiology) Bernie Charleston  J, MD as PCP - Cardiology (Cardiology) Cornelius Wanda DEL, MD as Consulting Physician (Oncology) Joesph Lynwood DEL, MD as Consulting Physician (General Surgery) Misenheimer, Evalene, MD as Consulting Physician (Unknown Physician Specialty) Micky Anes, DDS as Referring Physician (Orthodontics) Lininger, Ozell BIRCH., MD as Referring Physician (Surgery)   Review of Systems  Constitutional:  Negative for chills, fatigue and fever.  HENT:  Negative for congestion, ear pain and sore throat.   Respiratory:  Negative for cough and shortness of breath.   Cardiovascular:  Negative for chest pain.  Gastrointestinal:   Negative for abdominal pain, constipation, diarrhea, nausea and vomiting.       Bowel incontinence without warning for a couple of weeks.  She lowered her back stimulator and seems to have taken care of it.  It was diarrhea, but now formed.   Genitourinary:  Negative for dysuria and urgency.       No urinary incontinence.   Musculoskeletal:  Positive for back pain (stimulator and butrans  patch help.). Negative for arthralgias and myalgias.  Skin:  Negative for rash.  Neurological:  Negative for dizziness and headaches.  Psychiatric/Behavioral:  Negative for dysphoric mood. The patient is not nervous/anxious.     Medications Ordered Prior to Encounter[1] Past Medical History:  Diagnosis Date   A-fib (HCC) 09/08/2015   Allergy 2020   Nitroglycerin  and latex   Anemia 2023   Blood work   Atrial fibrillation [facility]) 2016   Sees Dr. Lynwood Rakers. Cardiac ablation, loop recorder (removed in 2022)   Atrial fibrillation with rapid ventricular response (HCC) 04/2015   Breast cancer (HCC)    right lumpectomy   BRONCHITIS 03/10/2009   Qualifier: Diagnosis of  By: Perley MD, Ronal Dines    Chest pain with moderate risk for cardiac etiology 04/28/2015   Dehydration 06/18/2009   Qualifier: Diagnosis of  By: Nonnie ROSALEA Iha     Depression    Fall 07/20/2021   Family history of breast cancer    Family history of colon cancer    Fibromyalgia 2010   Heart murmur 2016   Afib   Hyperlipidemia    Hypertension 2023   Repatha    Hypothyroidism (acquired) 2006   s/p thyroidectomy for nodules   Morbid obesity (HCC) 08/10/2015   Obesity    gastric bpg 2011, weight then 400 lbs, lost 100 lbs in 60 days.   Obstructive sleep apnea    does not use cpap, does not have a following physician   Paroxysmal atrial fibrillation (HCC) 08/10/2015   Sleep apnea 1999   Ulcer 2014   Past Surgical History:  Procedure Laterality Date   ABDOMINAL HYSTERECTOMY  1992   total.    APPENDECTOMY     BREAST BIOPSY  Left 08/04/2022   MM LT BREAST BX W LOC DEV 1ST LESION IMAGE BX SPEC STEREO GUIDE 08/04/2022 [facility]   BREAST BIOPSY Left 08/12/2022   MM LT BREAST BX W LOC DEV 1ST LESION IMAGE BX SPEC STEREO GUIDE 08/12/2022 [facility]   BREAST SURGERY  2019   right lumpectomy    CHOLECYSTECTOMY     COLONOSCOPY     ELECTROPHYSIOLOGIC STUDY N/A 09/08/2015   Procedure: Atrial Fibrillation Ablation;  Surgeon: Lynwood Rakers, MD;  Location: [facility] INVASIVE CV LAB;  Service: Cardiovascular;  Laterality: N/A;   EP IMPLANTABLE DEVICE N/A 12/17/2015   Procedure: Loop Recorder Insertion;  Surgeon: Lynwood Rakers, MD;  Location: [facility] INVASIVE CV LAB;  Service: Cardiovascular;  Laterality: N/A;   ESOPHAGOGASTRODUODENOSCOPY  EXPLORATORY LAPAROTOMY WITH ABDOMINAL MASS EXCISION  2018   GASTRIC BYPASS  2011   lost 100 lbs, but gained some back.   HERNIA REPAIR  2023   implantable loop recorder removal  03/25/2020   removed 2022   NECK SURGERY  2005   laminectomy?   THYROIDECTOMY  2006   for nodules ( hot and cold nodules) Goiter compressing trachea.    Family History  Problem Relation Age of Onset   Cancer Mother        breast   Hearing loss Mother    Obesity Mother    Heart failure Father    Cancer Father        colon   COPD Father    Heart disease Father    Obesity Father    Stroke Father    Coronary artery disease Brother 63   Heart failure Maternal Grandmother    Heart failure Maternal Grandfather    Social History   Socioeconomic History   Marital status: Single    Spouse name: Not on file   Number of children: Not on file   Years of education: Not on file   Highest education level: Bachelor's degree (e.g., BA, AB, BS)  Occupational History   Occupation: Economist  Tobacco Use   Smoking status: Never   Smokeless tobacco: Never  Vaping Use   Vaping status: Never Used  Substance and Sexual Activity   Alcohol use: Yes    Alcohol/week: 1.0 standard drink of  alcohol    Types: 1 Standard drinks or equivalent per week    Comment: rare glass of wine   Drug use: No   Sexual activity: Not Currently  Other Topics Concern   Not on file  Social History Narrative   Pt lives in [place] alone. Works as counsellor for [employer]   Right handed   Social Drivers of Health   Tobacco Use: Low Risk (05/21/2024)   Patient History    Smoking Tobacco Use: Never    Smokeless Tobacco Use: Never    Passive Exposure: Not on file  Financial Resource Strain: High Risk (05/17/2024)   Overall Financial Resource Strain (CARDIA)    Difficulty of Paying Living Expenses: Very hard  Food Insecurity: Food Insecurity Present (05/21/2024)   Epic    Worried About Programme Researcher, Broadcasting/film/video in the Last Year: Sometimes true    Ran Out of Food in the Last Year: Never true  Transportation Needs: No Transportation Needs (05/21/2024)   Epic    Lack of Transportation (Medical): No    Lack of Transportation (Non-Medical): No  Physical Activity: Inactive (05/21/2024)   Exercise Vital Sign    Days of Exercise per Week: 0 days    Minutes of Exercise per Session: 0 min  Stress: Stress Concern Present (05/21/2024)   [employer] of Occupational Health - Occupational Stress Questionnaire    Feeling of Stress: To some extent  Social Connections: Socially Isolated (05/21/2024)   Social Connection and Isolation Panel    Frequency of Communication with Friends and Family: More than three times a week    Frequency of Social Gatherings with Friends and Family: Once a week    Attends Religious Services: Never    Database Administrator or Organizations: No    Attends Banker Meetings: Never    Marital Status: Never married  Depression (PHQ2-9): Medium Risk (05/21/2024)   Depression (PHQ2-9)    PHQ-2 Score: 5  Alcohol Screen: Low  Risk (05/17/2024)   Alcohol Screen    Last Alcohol Screening Score (AUDIT): 1  Housing: Unknown (05/21/2024)   Epic    Unable  to Pay for Housing in the Last Year: No    Number of Times Moved in the Last Year: Not on file    Homeless in the Last Year: No  Utilities: At Risk (05/21/2024)   Epic    Threatened with loss of utilities: Yes  Health Literacy: Adequate Health Literacy (05/21/2024)   B1300 Health Literacy    Frequency of need for help with medical instructions: Never    Objective:  BP 106/60   Pulse 84   Temp 97.9 F (36.6 C) (Temporal)   Resp 16   Ht 5' 7 (1.702 m)   Wt 215 lb 9.6 oz (97.8 kg)   SpO2 97%   BMI 33.77 kg/m      05/21/2024    2:17 PM 05/21/2024    1:23 PM 01/29/2024    2:39 PM  BP/Weight  Systolic BP 106 106 104  Diastolic BP 60 60 68  Wt. (Lbs) 215.6 215.6 229  BMI 33.77 kg/m2 33.77 kg/m2 35.87 kg/m2    Physical Exam Vitals reviewed.  Constitutional:      Appearance: Normal appearance.  Neck:     Vascular: No carotid bruit.  Cardiovascular:     Rate and Rhythm: Normal rate and regular rhythm.     Heart sounds: Normal heart sounds.  Pulmonary:     Effort: Pulmonary effort is normal. No respiratory distress.     Breath sounds: Normal breath sounds.  Abdominal:     General: Abdomen is flat. Bowel sounds are normal.     Palpations: Abdomen is soft.     Tenderness: There is no abdominal tenderness.  Skin:    Findings: No rash.  Neurological:     Mental Status: She is alert and oriented to person, place, and time.     Cranial Nerves: No cranial nerve deficit.     Sensory: No sensory deficit.     Motor: No weakness.     Gait: Gait normal.     Comments: Negative hall p[place] BL.    Psychiatric:        Mood and Affect: Mood normal.        Behavior: Behavior normal.     {Perform Simple Foot Exam  Perform Detailed exam:1} {Insert foot Exam (Optional):30965}   Lab Results  Component Value Date   WBC 7.2 10/25/2023   HGB 13.9 10/25/2023   HCT 42.6 10/25/2023   PLT 252 10/25/2023   GLUCOSE 96 10/25/2023   CHOL 126 06/29/2023   TRIG 97 06/29/2023   HDL 59  06/29/2023   LDLCALC 49 06/29/2023   ALT 9 10/25/2023   AST 14 10/25/2023   NA 141 10/25/2023   K 3.6 10/25/2023   CL 100 10/25/2023   CREATININE 1.03 (H) 10/25/2023   BUN 18 10/25/2023   CO2 27 10/25/2023   TSH 1.380 08/11/2022   HGBA1C 5.4 06/29/2023    Results for orders placed or performed in visit on 11/28/23  ECHOCARDIOGRAM COMPLETE   Collection Time: 11/28/23  4:02 PM  Result Value Ref Range   S' Lateral 3.20 cm   Area-P 1/2 2.87 cm2   MV M vel 3.99 m/s   MV Peak grad 63.7 mmHg   Est EF 55 - 60%   .  Assessment & Plan:   Assessment & Plan Hypothyroidism (acquired) Taking levothyroxine  as prescribed. No new  symptoms. - Continue current levothyroxine  regimen. Orders:   TSH  Moderate recurrent major depression (HCC) Recurrent major depressive disorder Ongoing irritability and anger. Difficulty discerning medication effects. Discussed genetic predisposition. - Increased Trintellix  to 10 mg daily. - Provided samples of higher dose Trintellix . - Encouraged participation in social activities.    GAD (generalized anxiety disorder) The current medical regimen is effective;  continue present plan and medications.      05/21/2024    2:14 PM 02/27/2024    3:04 PM 02/27/2024    3:03 PM 02/27/2024    2:40 PM  GAD 7 : Generalized Anxiety Score  Nervous, Anxious, on Edge 0 2  2  Control/stop worrying 1 3  3   Worry too much - different things 1 2  2   Trouble relaxing 0 3  3  Restless 0 0  0  Easily annoyed or irritable 1 3  3   Afraid - awful might happen 0 0    Total GAD 7 Score 3 13    Anxiety Difficulty Somewhat difficult Somewhat difficult Not difficult at all      Orders:   hydrOXYzine (VISTARIL) 25 MG capsule; Take 1 capsule (25 mg total) by mouth every 8 (eight) hours as needed. (Patient not taking: Reported on 05/21/2024)   Chronic pain syndrome Management per specialist.      Vertigo     Pruritus Chronic pruritus with excoriations, primarily on  the head. Possible anxiety-related component. - Prescribed hydroxyzine 25 mg, up to three times daily as needed for itching and anxiety.    Esophageal dysphagia Post-bariatric surgery gastrointestinal complications Recent vomiting and inability to retain food. Significant weight loss. Possible esophageal dilation needed. - Referred to Dr. Nicolina for evaluation and possible esophageal dilation. - Ordered swallowing study. - Encouraged dietary modifications including smoothies and high-protein snacks. Orders:   SLP modified barium swallow; Future   DG Swallowing Func-Speech Pathology; Future   DG OP Swallowing Func-Medicare/Speech Path; Future  Vomiting without nausea, unspecified vomiting type Post-bariatric surgery gastrointestinal complications Recent vomiting and inability to retain food. Significant weight loss. Possible esophageal dilation needed. - Referred to Dr. Nicolina for evaluation and possible esophageal dilation. - Ordered swallowing study. - Encouraged dietary modifications including smoothies and high-protein snacks.    Paresthesia Check labs Orders:   B12 and Folate Panel   Methylmalonic acid, serum  Atrial fibrillation, unspecified type (HCC) The current medical regimen is effective;  continue present plan and medications.      Mixed hyperlipidemia Well controlled.  No changes to medicines. Continue repatha  Continue to work on eating a healthy diet and exercise.  Orders:   CBC with Differential/Platelet   Comprehensive metabolic panel with GFR   Lipid panel   Weight loss, unintentional Post-bariatric surgery gastrointestinal complications Recent vomiting and inability to retain food. Significant weight loss. Possible esophageal dilation needed. - Referred to Dr. Nicolina for evaluation and possible esophageal dilation. - Ordered swallowing study. - Encouraged dietary modifications including smoothies and high-protein snacks.     Dizziness Intermittent dizziness with orthostatic hypotension. Systolic BP below 80, diastolic below 50. No clear etiology identified. - Keep a blood pressure log and check BP and heart rate twice daily. - Ordered thyroid  level and B12 level tests.    Orthostatic hypotension Intermittent dizziness with orthostatic hypotension. Systolic BP below 80, diastolic below 50. No clear etiology identified. - Keep a blood pressure log and check BP and heart rate twice daily. - Ordered thyroid  level and B12 level tests.  Body mass index is 33.77 kg/m.   Total time spent on today's visit was 50 minutes, including both face-to-face time and nonface-to-face time personally spent on review of chart (labs and imaging), discussing labs and goals, discussing further work-up, treatment options, referrals to specialist if needed, reviewing outside records of pertinent, answering patient's questions, and coordinating care.   Meds ordered this encounter  Medications   hydrOXYzine (VISTARIL) 25 MG capsule    Sig: Take 1 capsule (25 mg total) by mouth every 8 (eight) hours as needed.    Dispense:  90 capsule    Refill:  2   vortioxetine  HBr (TRINTELLIX ) 10 MG TABS tablet    Sig: Take 1 tablet (10 mg total) by mouth daily.   vortioxetine  HBr (TRINTELLIX ) 10 MG TABS tablet    Sig: Take 1 tablet (10 mg total) by mouth daily.    Dispense:  90 tablet    Refill:  0    Orders Placed This Encounter  Procedures   DG Swallowing Func-Speech Pathology   DG OP Swallowing Func-Medicare/Speech Path   CBC with Differential/Platelet   Comprehensive metabolic panel with GFR   Lipid panel   TSH   B12 and Folate Panel   Methylmalonic acid, serum   SLP modified barium swallow    I,Marla I Leal-Borjas,acting as a scribe for Abigail Free, MD.,have documented all relevant documentation on the behalf of Abigail Free, MD,as directed by  Abigail Free, MD while in the presence of Abigail Free, MD.  Follow-up: Return  in about 3 months (around 08/19/2024) for chronic follow up.  An After Visit Summary was printed and given to the patient.  Abigail Free, MD [facility] [phone]     [1]  Current Outpatient Medications on File Prior to Visit  Medication Sig Dispense Refill   buprenorphine  (BUTRANS ) 10 MCG/HR PTWK Place 1 patch onto the skin once a week. 4 patch 2   buprenorphine  (BUTRANS ) 10 MCG/HR PTWK Place 1 patch onto the skin once a week. 4 patch 0   buPROPion  (WELLBUTRIN  XL) 150 MG 24 hr tablet Take 1 tablet (150 mg total) by mouth daily. 30 tablet 0   diltiazem  (CARDIZEM  CD) 120 MG 24 hr capsule Take 1 capsule (120 mg total) by mouth daily. 90 capsule 0   ELIQUIS  5 MG TABS tablet TAKE ONE TABLET BY MOUTH TWICE DAILY 60 tablet 2   Evolocumab  (REPATHA  SURECLICK) 140 MG/ML SOAJ INJECT 140 MG INTO THE SKIN EVERY 14 DAYS 6 mL 3   furosemide  (LASIX ) 40 MG tablet Take 1 tablet (40 mg total) by mouth daily. 90 tablet 2   gabapentin  (NEURONTIN ) 600 MG tablet Take 1 tablet (600 mg total) by mouth 4 (four) times daily. 360 tablet 1   letrozole (FEMARA) 2.5 MG tablet TAKE ONE TABLET BY MOUTH EVERY DAY 90 tablet 3   levothyroxine  (SYNTHROID ) 75 MCG tablet Take 1 tablet (75 mcg total) by mouth daily. 90 tablet 1   omeprazole  (PRILOSEC) 40 MG capsule Take 1 capsule (40 mg total) by mouth in the morning and at bedtime. 180 capsule 3   potassium chloride  SA (KLOR-CON  M20) 20 MEQ tablet Take 1 tablet (20 mEq total) by mouth daily. 90 tablet 2   Zoledronic  Acid (RECLAST  IV) Inject into the vein.     No current facility-administered medications on file prior to visit.   "

## 2024-05-21 NOTE — Assessment & Plan Note (Signed)
 Intermittent dizziness with orthostatic hypotension. Systolic BP below 80, diastolic below 50. No clear etiology identified. - Keep a blood pressure log and check BP and heart rate twice daily. - Ordered thyroid  level and B12 level tests.

## 2024-05-21 NOTE — Patient Instructions (Signed)
" °  VISIT SUMMARY: During your visit, we discussed your dizziness, scalp sensations, gastrointestinal issues, bowel incontinence, and mood symptoms. We adjusted your medications and provided referrals for further evaluation.  YOUR PLAN: RECURRENT MAJOR DEPRESSIVE DISORDER:  -Start Trintellix  to 10 mg daily. -Provided samples of higher dose Trintellix . Prescription will be sent in about 3 weeks to continue this dose.  -Encouraged participation in social activities.  HYPOTHYROIDISM: You are taking levothyroxine  as prescribed and have no new symptoms. -Continue current levothyroxine  regimen.  DIZZINESS: You experience intermittent dizziness -Keep a blood pressure log and check your blood pressure and heart rate twice daily. -Ordered thyroid  level and B12 level tests.  POST-BARIATRIC SURGERY GASTROINTESTINAL COMPLICATIONS: You have been experiencing vomiting and significant weight loss, possibly due to complications from your previous surgery. -Referred to Dr. Nicolina for evaluation and possible esophageal dilation. -Ordered a swallowing study. -Encouraged dietary modifications including smoothies and high-protein snacks.  CHRONIC PRURITUS WITH EXCORIATIONS: You have chronic itching, primarily on your head, which may be related to anxiety. -Prescribed hydroxyzine 25 mg, up to three times daily as needed for itching and anxiety.                      Contains text generated by [employer]bridge.                                 Contains text generated by [employer]bridge.   "

## 2024-05-21 NOTE — Assessment & Plan Note (Addendum)
 The current medical regimen is effective;  continue present plan and medications.      05/21/2024    2:14 PM 02/27/2024    3:04 PM 02/27/2024    3:03 PM 02/27/2024    2:40 PM  GAD 7 : Generalized Anxiety Score  Nervous, Anxious, on Edge 0 2  2  Control/stop worrying 1 3  3   Worry too much - different things 1 2  2   Trouble relaxing 0 3  3  Restless 0 0  0  Easily annoyed or irritable 1 3  3   Afraid - awful might happen 0 0    Total GAD 7 Score 3 13    Anxiety Difficulty Somewhat difficult Somewhat difficult Not difficult at all      Orders:   hydrOXYzine (VISTARIL) 25 MG capsule; Take 1 capsule (25 mg total) by mouth every 8 (eight) hours as needed. (Patient not taking: Reported on 05/21/2024)

## 2024-05-21 NOTE — Assessment & Plan Note (Addendum)
 Check labs. Orders:   B12 and Folate Panel   Methylmalonic acid, serum

## 2024-05-22 ENCOUNTER — Telehealth (HOSPITAL_COMMUNITY): Payer: Self-pay

## 2024-05-22 NOTE — Telephone Encounter (Signed)
 Patient returned our call and left voicemail- attempted to call her back are requested ph number [phone] however it rings once and says calls to this number are being screened by smart call blocker, the number you are calling is not accepting your call, please hang up

## 2024-05-22 NOTE — Telephone Encounter (Signed)
 Attempted to contact patient at [phone] to schedule OP MBS (swallow test). Left voicemail with request for call back.

## 2024-05-23 ENCOUNTER — Ambulatory Visit: Payer: Self-pay | Admitting: Family Medicine

## 2024-05-23 LAB — CBC WITH DIFFERENTIAL/PLATELET
Basophils Absolute: 0.1 x10E3/uL (ref 0.0–0.2)
Basos: 1 %
EOS (ABSOLUTE): 0.1 x10E3/uL (ref 0.0–0.4)
Eos: 1 %
Hematocrit: 41.6 % (ref 34.0–46.6)
Hemoglobin: 13.5 g/dL (ref 11.1–15.9)
Immature Grans (Abs): 0 x10E3/uL (ref 0.0–0.1)
Immature Granulocytes: 0 %
Lymphocytes Absolute: 1.9 x10E3/uL (ref 0.7–3.1)
Lymphs: 30 %
MCH: 31.4 pg (ref 26.6–33.0)
MCHC: 32.5 g/dL (ref 31.5–35.7)
MCV: 97 fL (ref 79–97)
Monocytes Absolute: 0.4 x10E3/uL (ref 0.1–0.9)
Monocytes: 7 %
Neutrophils Absolute: 3.9 x10E3/uL (ref 1.4–7.0)
Neutrophils: 61 %
Platelets: 290 x10E3/uL (ref 150–450)
RBC: 4.3 x10E6/uL (ref 3.77–5.28)
RDW: 13.8 % (ref 11.7–15.4)
WBC: 6.3 x10E3/uL (ref 3.4–10.8)

## 2024-05-23 LAB — COMPREHENSIVE METABOLIC PANEL WITH GFR
ALT: 8 IU/L (ref 0–32)
AST: 15 IU/L (ref 0–40)
Albumin: 3.4 g/dL — ABNORMAL LOW (ref 3.9–4.9)
Alkaline Phosphatase: 119 IU/L (ref 49–135)
BUN/Creatinine Ratio: 14 (ref 12–28)
BUN: 16 mg/dL (ref 8–27)
Bilirubin Total: 0.6 mg/dL (ref 0.0–1.2)
CO2: 25 mmol/L (ref 20–29)
Calcium: 8.5 mg/dL — ABNORMAL LOW (ref 8.7–10.3)
Chloride: 100 mmol/L (ref 96–106)
Creatinine, Ser: 1.15 mg/dL — ABNORMAL HIGH (ref 0.57–1.00)
Globulin, Total: 2.5 g/dL (ref 1.5–4.5)
Glucose: 78 mg/dL (ref 70–99)
Potassium: 3.9 mmol/L (ref 3.5–5.2)
Sodium: 140 mmol/L (ref 134–144)
Total Protein: 5.9 g/dL — ABNORMAL LOW (ref 6.0–8.5)
eGFR: 52 mL/min/1.73 — ABNORMAL LOW

## 2024-05-23 LAB — LIPID PANEL
Chol/HDL Ratio: 2.3 ratio (ref 0.0–4.4)
Cholesterol, Total: 115 mg/dL (ref 100–199)
HDL: 51 mg/dL
LDL Chol Calc (NIH): 43 mg/dL (ref 0–99)
Triglycerides: 115 mg/dL (ref 0–149)
VLDL Cholesterol Cal: 21 mg/dL (ref 5–40)

## 2024-05-23 LAB — METHYLMALONIC ACID, SERUM: Methylmalonic Acid: 563 nmol/L — ABNORMAL HIGH (ref 0–378)

## 2024-05-23 LAB — B12 AND FOLATE PANEL
Folate: 4.8 ng/mL
Vitamin B-12: 316 pg/mL (ref 232–1245)

## 2024-05-23 LAB — TSH: TSH: 1.94 u[IU]/mL (ref 0.450–4.500)

## 2024-05-29 ENCOUNTER — Telehealth (HOSPITAL_COMMUNITY): Payer: Self-pay

## 2024-05-29 NOTE — Telephone Encounter (Signed)
 Second attempt to contact patient to schedule swallow test on patient's requested ph number [phone], however it rings once and says calls to this number are being screened by smart call blocker, the number you are calling is not accepting your call, please hang up and does not allow us  to leave a voicemail. Called her listed home number and left a detailed VM and our number so she could call back.

## 2024-05-30 ENCOUNTER — Other Ambulatory Visit (HOSPITAL_COMMUNITY): Payer: Self-pay | Admitting: Family Medicine

## 2024-05-31 ENCOUNTER — Other Ambulatory Visit: Payer: Self-pay | Admitting: Family Medicine

## 2024-06-21 ENCOUNTER — Encounter: Payer: Self-pay | Admitting: Hematology and Oncology

## 2024-06-22 ENCOUNTER — Other Ambulatory Visit: Payer: Self-pay | Admitting: Family Medicine

## 2024-07-01 ENCOUNTER — Encounter (HOSPITAL_COMMUNITY)

## 2024-07-25 ENCOUNTER — Ambulatory Visit (HOSPITAL_BASED_OUTPATIENT_CLINIC_OR_DEPARTMENT_OTHER): Admitting: Radiology

## 2024-07-25 ENCOUNTER — Other Ambulatory Visit (HOSPITAL_BASED_OUTPATIENT_CLINIC_OR_DEPARTMENT_OTHER): Admitting: Radiology

## 2024-07-31 ENCOUNTER — Ambulatory Visit: Admitting: Oncology

## 2024-07-31 ENCOUNTER — Other Ambulatory Visit

## 2025-05-28 ENCOUNTER — Ambulatory Visit
